# Patient Record
Sex: Female | Born: 1935 | Race: White | Hispanic: No | Marital: Married | Smoking: Former smoker
Health system: Southern US, Community
[De-identification: ages and names within clinical notes are randomized; demographics above are authoritative.]

## PROBLEM LIST (undated history)

## (undated) DIAGNOSIS — J189 Pneumonia, unspecified organism: Secondary | ICD-10-CM

## (undated) DIAGNOSIS — L821 Other seborrheic keratosis: Secondary | ICD-10-CM

## (undated) DIAGNOSIS — M48061 Spinal stenosis, lumbar region without neurogenic claudication: Secondary | ICD-10-CM

## (undated) DIAGNOSIS — G309 Alzheimer's disease, unspecified: Secondary | ICD-10-CM

## (undated) DIAGNOSIS — G244 Idiopathic orofacial dystonia: Secondary | ICD-10-CM

## (undated) DIAGNOSIS — R1319 Other dysphagia: Secondary | ICD-10-CM

## (undated) DIAGNOSIS — M25559 Pain in unspecified hip: Secondary | ICD-10-CM

## (undated) DIAGNOSIS — M25569 Pain in unspecified knee: Secondary | ICD-10-CM

## (undated) DIAGNOSIS — F329 Major depressive disorder, single episode, unspecified: Secondary | ICD-10-CM

## (undated) DIAGNOSIS — H9319 Tinnitus, unspecified ear: Secondary | ICD-10-CM

## (undated) DIAGNOSIS — F0390 Unspecified dementia without behavioral disturbance: Secondary | ICD-10-CM

## (undated) DIAGNOSIS — R61 Generalized hyperhidrosis: Secondary | ICD-10-CM

## (undated) DIAGNOSIS — K589 Irritable bowel syndrome without diarrhea: Secondary | ICD-10-CM

## (undated) DIAGNOSIS — F419 Anxiety disorder, unspecified: Secondary | ICD-10-CM

## (undated) DIAGNOSIS — R51 Headache: Secondary | ICD-10-CM

## (undated) DIAGNOSIS — I129 Hypertensive chronic kidney disease with stage 1 through stage 4 chronic kidney disease, or unspecified chronic kidney disease: Secondary | ICD-10-CM

## (undated) DIAGNOSIS — I509 Heart failure, unspecified: Secondary | ICD-10-CM

## (undated) DIAGNOSIS — G43109 Migraine with aura, not intractable, without status migrainosus: Secondary | ICD-10-CM

## (undated) DIAGNOSIS — L89309 Pressure ulcer of unspecified buttock, unspecified stage: Secondary | ICD-10-CM

## (undated) DIAGNOSIS — R5381 Other malaise: Secondary | ICD-10-CM

## (undated) DIAGNOSIS — K2100 Gastro-esophageal reflux disease with esophagitis, without bleeding: Secondary | ICD-10-CM

## (undated) DIAGNOSIS — R0902 Hypoxemia: Secondary | ICD-10-CM

## (undated) DIAGNOSIS — K5900 Constipation, unspecified: Secondary | ICD-10-CM

## (undated) DIAGNOSIS — N390 Urinary tract infection, site not specified: Secondary | ICD-10-CM

## (undated) DIAGNOSIS — I480 Paroxysmal atrial fibrillation: Secondary | ICD-10-CM

## (undated) DIAGNOSIS — I872 Venous insufficiency (chronic) (peripheral): Secondary | ICD-10-CM

## (undated) DIAGNOSIS — J42 Unspecified chronic bronchitis: Secondary | ICD-10-CM

## (undated) DIAGNOSIS — R011 Cardiac murmur, unspecified: Secondary | ICD-10-CM

## (undated) DIAGNOSIS — E871 Hypo-osmolality and hyponatremia: Secondary | ICD-10-CM

## (undated) DIAGNOSIS — R4182 Altered mental status, unspecified: Secondary | ICD-10-CM

## (undated) DIAGNOSIS — D66 Hereditary factor VIII deficiency: Secondary | ICD-10-CM

## (undated) DIAGNOSIS — F0280 Dementia in other diseases classified elsewhere without behavioral disturbance: Secondary | ICD-10-CM

## (undated) DIAGNOSIS — G894 Chronic pain syndrome: Secondary | ICD-10-CM

## (undated) DIAGNOSIS — N289 Disorder of kidney and ureter, unspecified: Secondary | ICD-10-CM

## (undated) DIAGNOSIS — S329XXA Fracture of unspecified parts of lumbosacral spine and pelvis, initial encounter for closed fracture: Secondary | ICD-10-CM

## (undated) DIAGNOSIS — R609 Edema, unspecified: Secondary | ICD-10-CM

## (undated) DIAGNOSIS — I2510 Atherosclerotic heart disease of native coronary artery without angina pectoris: Secondary | ICD-10-CM

## (undated) DIAGNOSIS — M1990 Unspecified osteoarthritis, unspecified site: Secondary | ICD-10-CM

## (undated) DIAGNOSIS — E039 Hypothyroidism, unspecified: Secondary | ICD-10-CM

## (undated) DIAGNOSIS — I10 Essential (primary) hypertension: Secondary | ICD-10-CM

## (undated) DIAGNOSIS — E559 Vitamin D deficiency, unspecified: Secondary | ICD-10-CM

## (undated) DIAGNOSIS — C189 Malignant neoplasm of colon, unspecified: Secondary | ICD-10-CM

## (undated) DIAGNOSIS — L02419 Cutaneous abscess of limb, unspecified: Secondary | ICD-10-CM

## (undated) DIAGNOSIS — R0989 Other specified symptoms and signs involving the circulatory and respiratory systems: Secondary | ICD-10-CM

## (undated) DIAGNOSIS — R1310 Dysphagia, unspecified: Secondary | ICD-10-CM

## (undated) DIAGNOSIS — M810 Age-related osteoporosis without current pathological fracture: Secondary | ICD-10-CM

## (undated) DIAGNOSIS — D62 Acute posthemorrhagic anemia: Secondary | ICD-10-CM

## (undated) DIAGNOSIS — R5383 Other fatigue: Secondary | ICD-10-CM

## (undated) DIAGNOSIS — L03119 Cellulitis of unspecified part of limb: Secondary | ICD-10-CM

## (undated) DIAGNOSIS — R0602 Shortness of breath: Secondary | ICD-10-CM

## (undated) DIAGNOSIS — F22 Delusional disorders: Secondary | ICD-10-CM

## (undated) DIAGNOSIS — I6990 Unspecified sequelae of unspecified cerebrovascular disease: Secondary | ICD-10-CM

## (undated) DIAGNOSIS — D3500 Benign neoplasm of unspecified adrenal gland: Secondary | ICD-10-CM

## (undated) DIAGNOSIS — R413 Other amnesia: Secondary | ICD-10-CM

## (undated) DIAGNOSIS — E785 Hyperlipidemia, unspecified: Secondary | ICD-10-CM

## (undated) DIAGNOSIS — I214 Non-ST elevation (NSTEMI) myocardial infarction: Secondary | ICD-10-CM

## (undated) HISTORY — DX: Venous insufficiency (chronic) (peripheral): I87.2

## (undated) HISTORY — DX: Disorder of kidney and ureter, unspecified: N28.9

## (undated) HISTORY — DX: Cutaneous abscess of limb, unspecified: L02.419

## (undated) HISTORY — DX: Alzheimer's disease, unspecified: G30.9

## (undated) HISTORY — DX: Cellulitis of unspecified part of limb: L03.119

## (undated) HISTORY — DX: Anxiety disorder, unspecified: F41.9

## (undated) HISTORY — DX: Hypoxemia: R09.02

## (undated) HISTORY — DX: Dementia in other diseases classified elsewhere without behavioral disturbance: F02.80

## (undated) HISTORY — DX: Acute posthemorrhagic anemia: D62

## (undated) HISTORY — DX: Irritable bowel syndrome without diarrhea: K58.9

## (undated) HISTORY — DX: Urinary tract infection, site not specified: N39.0

## (undated) HISTORY — DX: Heart failure, unspecified: I50.9

## (undated) HISTORY — DX: Fracture of unspecified parts of lumbosacral spine and pelvis, initial encounter for closed fracture: S32.9XXA

## (undated) HISTORY — DX: Hyperlipidemia, unspecified: E78.5

## (undated) HISTORY — DX: Hypertensive chronic kidney disease with stage 1 through stage 4 chronic kidney disease, or unspecified chronic kidney disease: I12.9

## (undated) HISTORY — DX: Tinnitus, unspecified ear: H93.19

## (undated) HISTORY — DX: Unspecified sequelae of unspecified cerebrovascular disease: I69.90

## (undated) HISTORY — DX: Vitamin D deficiency, unspecified: E55.9

## (undated) HISTORY — DX: Dysphagia, unspecified: R13.10

## (undated) HISTORY — DX: Other seborrheic keratosis: L82.1

## (undated) HISTORY — DX: Other dysphagia: R13.19

## (undated) HISTORY — DX: Hereditary factor VIII deficiency: D66

## (undated) HISTORY — DX: Spinal stenosis, lumbar region without neurogenic claudication: M48.061

## (undated) HISTORY — DX: Other amnesia: R41.3

## (undated) HISTORY — DX: Idiopathic orofacial dystonia: G24.4

## (undated) HISTORY — DX: Pressure ulcer of unspecified buttock, unspecified stage: L89.309

## (undated) HISTORY — DX: Constipation, unspecified: K59.00

## (undated) HISTORY — DX: Malignant neoplasm of colon, unspecified: C18.9

## (undated) HISTORY — PX: TONSILLECTOMY: SUR1361

## (undated) HISTORY — DX: Benign neoplasm of unspecified adrenal gland: D35.00

## (undated) HISTORY — DX: Gastro-esophageal reflux disease with esophagitis, without bleeding: K21.00

## (undated) HISTORY — DX: Delusional disorders: F22

## (undated) HISTORY — DX: Unspecified dementia without behavioral disturbance: F03.90

## (undated) HISTORY — DX: Atherosclerotic heart disease of native coronary artery without angina pectoris: I25.10

## (undated) HISTORY — DX: Altered mental status, unspecified: R41.82

## (undated) HISTORY — DX: Age-related osteoporosis without current pathological fracture: M81.0

## (undated) HISTORY — DX: Shortness of breath: R06.02

## (undated) HISTORY — DX: Pain in unspecified knee: M25.569

## (undated) HISTORY — DX: Unspecified osteoarthritis, unspecified site: M19.90

## (undated) HISTORY — DX: Edema, unspecified: R60.9

## (undated) HISTORY — DX: Pain in unspecified hip: M25.559

## (undated) HISTORY — DX: Unspecified chronic bronchitis: J42

## (undated) HISTORY — DX: Gastro-esophageal reflux disease with esophagitis: K21.0

## (undated) HISTORY — DX: Paroxysmal atrial fibrillation: I48.0

## (undated) HISTORY — DX: Other malaise: R53.81

## (undated) HISTORY — DX: Other fatigue: R53.83

## (undated) HISTORY — DX: Other specified symptoms and signs involving the circulatory and respiratory systems: R09.89

## (undated) HISTORY — DX: Generalized hyperhidrosis: R61

## (undated) HISTORY — DX: Pneumonia, unspecified organism: J18.9

## (undated) HISTORY — DX: Migraine with aura, not intractable, without status migrainosus: G43.109

## (undated) HISTORY — DX: Non-ST elevation (NSTEMI) myocardial infarction: I21.4

## (undated) HISTORY — DX: Hypo-osmolality and hyponatremia: E87.1

## (undated) HISTORY — DX: Chronic pain syndrome: G89.4

---

## 1998-08-25 ENCOUNTER — Ambulatory Visit: Admission: RE | Admit: 1998-08-25 | Discharge: 1998-08-25 | Payer: Self-pay | Admitting: Internal Medicine

## 1999-01-09 ENCOUNTER — Encounter: Payer: Self-pay | Admitting: Ophthalmology

## 1999-01-09 ENCOUNTER — Ambulatory Visit (HOSPITAL_COMMUNITY): Admission: RE | Admit: 1999-01-09 | Discharge: 1999-01-10 | Payer: Self-pay | Admitting: Ophthalmology

## 1999-02-14 ENCOUNTER — Encounter: Payer: Self-pay | Admitting: Emergency Medicine

## 1999-02-14 ENCOUNTER — Emergency Department (HOSPITAL_COMMUNITY): Admission: EM | Admit: 1999-02-14 | Discharge: 1999-02-14 | Payer: Self-pay | Admitting: Emergency Medicine

## 1999-12-11 ENCOUNTER — Encounter: Admission: RE | Admit: 1999-12-11 | Discharge: 2000-02-22 | Payer: Self-pay | Admitting: Family Medicine

## 2000-02-27 ENCOUNTER — Encounter: Admission: RE | Admit: 2000-02-27 | Discharge: 2000-02-27 | Payer: Self-pay | Admitting: Internal Medicine

## 2000-02-27 ENCOUNTER — Encounter: Payer: Self-pay | Admitting: Internal Medicine

## 2000-11-12 DIAGNOSIS — E785 Hyperlipidemia, unspecified: Secondary | ICD-10-CM

## 2000-11-12 HISTORY — DX: Hyperlipidemia, unspecified: E78.5

## 2001-11-12 DIAGNOSIS — G43109 Migraine with aura, not intractable, without status migrainosus: Secondary | ICD-10-CM

## 2001-11-12 DIAGNOSIS — R0989 Other specified symptoms and signs involving the circulatory and respiratory systems: Secondary | ICD-10-CM

## 2001-11-12 DIAGNOSIS — H9319 Tinnitus, unspecified ear: Secondary | ICD-10-CM

## 2001-11-12 HISTORY — DX: Migraine with aura, not intractable, without status migrainosus: G43.109

## 2001-11-12 HISTORY — DX: Tinnitus, unspecified ear: H93.19

## 2001-11-12 HISTORY — DX: Other specified symptoms and signs involving the circulatory and respiratory systems: R09.89

## 2002-09-29 DIAGNOSIS — G43109 Migraine with aura, not intractable, without status migrainosus: Secondary | ICD-10-CM | POA: Insufficient documentation

## 2002-09-29 DIAGNOSIS — H9319 Tinnitus, unspecified ear: Secondary | ICD-10-CM | POA: Insufficient documentation

## 2002-11-12 DIAGNOSIS — F419 Anxiety disorder, unspecified: Secondary | ICD-10-CM

## 2002-11-12 DIAGNOSIS — I6990 Unspecified sequelae of unspecified cerebrovascular disease: Secondary | ICD-10-CM

## 2002-11-12 HISTORY — DX: Unspecified sequelae of unspecified cerebrovascular disease: I69.90

## 2002-11-12 HISTORY — DX: Anxiety disorder, unspecified: F41.9

## 2003-06-20 ENCOUNTER — Ambulatory Visit (HOSPITAL_COMMUNITY): Admission: RE | Admit: 2003-06-20 | Discharge: 2003-06-20 | Payer: Self-pay | Admitting: Internal Medicine

## 2003-06-20 ENCOUNTER — Encounter: Payer: Self-pay | Admitting: Internal Medicine

## 2003-10-27 ENCOUNTER — Emergency Department (HOSPITAL_COMMUNITY): Admission: EM | Admit: 2003-10-27 | Discharge: 2003-10-27 | Payer: Self-pay | Admitting: Emergency Medicine

## 2003-11-03 DIAGNOSIS — F0280 Dementia in other diseases classified elsewhere without behavioral disturbance: Secondary | ICD-10-CM | POA: Insufficient documentation

## 2003-11-13 DIAGNOSIS — R0602 Shortness of breath: Secondary | ICD-10-CM

## 2003-11-13 HISTORY — DX: Shortness of breath: R06.02

## 2003-11-18 ENCOUNTER — Ambulatory Visit (HOSPITAL_COMMUNITY): Admission: RE | Admit: 2003-11-18 | Discharge: 2003-11-18 | Payer: Self-pay | Admitting: Neurology

## 2004-06-28 DIAGNOSIS — R0602 Shortness of breath: Secondary | ICD-10-CM | POA: Insufficient documentation

## 2004-06-28 DIAGNOSIS — M1990 Unspecified osteoarthritis, unspecified site: Secondary | ICD-10-CM

## 2004-06-28 HISTORY — DX: Unspecified osteoarthritis, unspecified site: M19.90

## 2004-10-16 ENCOUNTER — Ambulatory Visit (HOSPITAL_COMMUNITY): Admission: RE | Admit: 2004-10-16 | Discharge: 2004-10-16 | Payer: Self-pay | Admitting: Ophthalmology

## 2004-11-12 DIAGNOSIS — J42 Unspecified chronic bronchitis: Secondary | ICD-10-CM

## 2004-11-12 HISTORY — DX: Unspecified chronic bronchitis: J42

## 2004-12-25 ENCOUNTER — Ambulatory Visit (HOSPITAL_COMMUNITY): Admission: RE | Admit: 2004-12-25 | Discharge: 2004-12-26 | Payer: Self-pay | Admitting: Ophthalmology

## 2005-06-12 DIAGNOSIS — F32A Depression, unspecified: Secondary | ICD-10-CM

## 2005-06-12 HISTORY — DX: Depression, unspecified: F32.A

## 2005-06-21 DIAGNOSIS — E039 Hypothyroidism, unspecified: Secondary | ICD-10-CM | POA: Insufficient documentation

## 2005-09-04 ENCOUNTER — Ambulatory Visit: Payer: Self-pay

## 2005-09-13 ENCOUNTER — Ambulatory Visit: Payer: Self-pay | Admitting: Internal Medicine

## 2005-09-28 ENCOUNTER — Ambulatory Visit: Payer: Self-pay | Admitting: Internal Medicine

## 2005-10-12 ENCOUNTER — Ambulatory Visit: Payer: Self-pay | Admitting: Internal Medicine

## 2005-11-12 DIAGNOSIS — K589 Irritable bowel syndrome without diarrhea: Secondary | ICD-10-CM

## 2005-11-12 DIAGNOSIS — R61 Generalized hyperhidrosis: Secondary | ICD-10-CM

## 2005-11-12 HISTORY — DX: Irritable bowel syndrome, unspecified: K58.9

## 2005-11-12 HISTORY — DX: Generalized hyperhidrosis: R61

## 2005-11-30 ENCOUNTER — Ambulatory Visit: Payer: Self-pay | Admitting: Pulmonary Disease

## 2006-01-11 ENCOUNTER — Ambulatory Visit: Payer: Self-pay | Admitting: Internal Medicine

## 2006-02-20 DIAGNOSIS — K589 Irritable bowel syndrome without diarrhea: Secondary | ICD-10-CM | POA: Insufficient documentation

## 2006-03-01 ENCOUNTER — Encounter: Admission: RE | Admit: 2006-03-01 | Discharge: 2006-03-01 | Payer: Self-pay | Admitting: Gastroenterology

## 2006-03-15 ENCOUNTER — Ambulatory Visit (HOSPITAL_COMMUNITY): Admission: RE | Admit: 2006-03-15 | Discharge: 2006-03-15 | Payer: Self-pay | Admitting: Neurology

## 2006-08-16 ENCOUNTER — Ambulatory Visit: Payer: Self-pay

## 2006-11-12 DIAGNOSIS — F0390 Unspecified dementia without behavioral disturbance: Secondary | ICD-10-CM

## 2006-11-12 DIAGNOSIS — F0392 Unspecified dementia, unspecified severity, with psychotic disturbance: Secondary | ICD-10-CM

## 2006-11-12 HISTORY — DX: Unspecified dementia, unspecified severity, with psychotic disturbance: F03.92

## 2006-11-12 HISTORY — DX: Unspecified dementia without behavioral disturbance: F03.90

## 2007-02-04 ENCOUNTER — Emergency Department (HOSPITAL_COMMUNITY): Admission: EM | Admit: 2007-02-04 | Discharge: 2007-02-04 | Payer: Self-pay | Admitting: *Deleted

## 2007-02-27 ENCOUNTER — Ambulatory Visit: Payer: Self-pay

## 2007-04-10 DIAGNOSIS — R634 Abnormal weight loss: Secondary | ICD-10-CM | POA: Insufficient documentation

## 2007-09-18 ENCOUNTER — Encounter: Admission: RE | Admit: 2007-09-18 | Discharge: 2007-09-18 | Payer: Self-pay | Admitting: Orthopedic Surgery

## 2007-11-13 DIAGNOSIS — D66 Hereditary factor VIII deficiency: Secondary | ICD-10-CM

## 2007-11-13 DIAGNOSIS — R5381 Other malaise: Secondary | ICD-10-CM

## 2007-11-13 HISTORY — DX: Hereditary factor VIII deficiency: D66

## 2007-11-13 HISTORY — DX: Other malaise: R53.81

## 2008-01-13 ENCOUNTER — Ambulatory Visit: Payer: Self-pay

## 2008-01-16 ENCOUNTER — Ambulatory Visit: Payer: Self-pay | Admitting: Cardiology

## 2008-01-29 ENCOUNTER — Ambulatory Visit: Payer: Self-pay

## 2008-01-29 ENCOUNTER — Encounter: Payer: Self-pay | Admitting: Cardiology

## 2008-02-23 ENCOUNTER — Ambulatory Visit: Payer: Self-pay | Admitting: Cardiology

## 2008-11-12 DIAGNOSIS — E559 Vitamin D deficiency, unspecified: Secondary | ICD-10-CM

## 2008-11-12 DIAGNOSIS — N289 Disorder of kidney and ureter, unspecified: Secondary | ICD-10-CM

## 2008-11-12 DIAGNOSIS — R609 Edema, unspecified: Secondary | ICD-10-CM

## 2008-11-12 HISTORY — DX: Disorder of kidney and ureter, unspecified: N28.9

## 2008-11-12 HISTORY — DX: Edema, unspecified: R60.9

## 2008-11-12 HISTORY — DX: Vitamin D deficiency, unspecified: E55.9

## 2009-01-26 ENCOUNTER — Ambulatory Visit: Payer: Self-pay

## 2009-04-15 ENCOUNTER — Emergency Department (HOSPITAL_BASED_OUTPATIENT_CLINIC_OR_DEPARTMENT_OTHER): Admission: EM | Admit: 2009-04-15 | Discharge: 2009-04-15 | Payer: Self-pay | Admitting: Emergency Medicine

## 2009-04-15 ENCOUNTER — Ambulatory Visit: Payer: Self-pay | Admitting: Diagnostic Radiology

## 2009-04-20 ENCOUNTER — Emergency Department (HOSPITAL_BASED_OUTPATIENT_CLINIC_OR_DEPARTMENT_OTHER): Admission: EM | Admit: 2009-04-20 | Discharge: 2009-04-20 | Payer: Self-pay | Admitting: Emergency Medicine

## 2009-04-26 ENCOUNTER — Ambulatory Visit (HOSPITAL_COMMUNITY): Admission: RE | Admit: 2009-04-26 | Discharge: 2009-04-26 | Payer: Self-pay | Admitting: Orthopedic Surgery

## 2009-04-26 ENCOUNTER — Encounter (INDEPENDENT_AMBULATORY_CARE_PROVIDER_SITE_OTHER): Payer: Self-pay | Admitting: Orthopedic Surgery

## 2009-04-26 ENCOUNTER — Ambulatory Visit: Payer: Self-pay | Admitting: Vascular Surgery

## 2009-04-28 ENCOUNTER — Encounter: Admission: RE | Admit: 2009-04-28 | Discharge: 2009-05-27 | Payer: Self-pay | Admitting: Orthopedic Surgery

## 2009-07-21 DIAGNOSIS — R609 Edema, unspecified: Secondary | ICD-10-CM | POA: Insufficient documentation

## 2009-09-06 DIAGNOSIS — E559 Vitamin D deficiency, unspecified: Secondary | ICD-10-CM | POA: Insufficient documentation

## 2009-10-19 ENCOUNTER — Encounter: Admission: RE | Admit: 2009-10-19 | Discharge: 2009-10-19 | Payer: Self-pay | Admitting: Internal Medicine

## 2009-11-12 DIAGNOSIS — M48061 Spinal stenosis, lumbar region without neurogenic claudication: Secondary | ICD-10-CM

## 2009-11-12 HISTORY — DX: Spinal stenosis, lumbar region without neurogenic claudication: M48.061

## 2010-01-27 ENCOUNTER — Encounter: Admission: RE | Admit: 2010-01-27 | Discharge: 2010-01-27 | Payer: Self-pay | Admitting: Orthopedic Surgery

## 2010-05-25 DIAGNOSIS — B3749 Other urogenital candidiasis: Secondary | ICD-10-CM | POA: Insufficient documentation

## 2010-08-15 DIAGNOSIS — R635 Abnormal weight gain: Secondary | ICD-10-CM | POA: Insufficient documentation

## 2010-08-28 ENCOUNTER — Encounter: Admission: RE | Admit: 2010-08-28 | Discharge: 2010-08-28 | Payer: Self-pay | Admitting: Neurosurgery

## 2010-09-12 DIAGNOSIS — R413 Other amnesia: Secondary | ICD-10-CM

## 2010-09-12 HISTORY — DX: Other amnesia: R41.3

## 2010-09-27 DIAGNOSIS — R413 Other amnesia: Secondary | ICD-10-CM | POA: Insufficient documentation

## 2010-09-27 DIAGNOSIS — I10 Essential (primary) hypertension: Secondary | ICD-10-CM | POA: Insufficient documentation

## 2010-11-12 DIAGNOSIS — M25569 Pain in unspecified knee: Secondary | ICD-10-CM

## 2010-11-12 DIAGNOSIS — I129 Hypertensive chronic kidney disease with stage 1 through stage 4 chronic kidney disease, or unspecified chronic kidney disease: Secondary | ICD-10-CM

## 2010-11-12 DIAGNOSIS — L821 Other seborrheic keratosis: Secondary | ICD-10-CM

## 2010-11-12 HISTORY — DX: Pain in unspecified knee: M25.569

## 2010-11-12 HISTORY — DX: Hypertensive chronic kidney disease with stage 1 through stage 4 chronic kidney disease, or unspecified chronic kidney disease: I12.9

## 2010-11-12 HISTORY — DX: Other seborrheic keratosis: L82.1

## 2010-11-23 DIAGNOSIS — E871 Hypo-osmolality and hyponatremia: Secondary | ICD-10-CM | POA: Insufficient documentation

## 2010-11-28 ENCOUNTER — Encounter
Admission: RE | Admit: 2010-11-28 | Discharge: 2010-11-28 | Payer: Self-pay | Source: Home / Self Care | Attending: Internal Medicine | Admitting: Internal Medicine

## 2010-12-26 ENCOUNTER — Other Ambulatory Visit: Payer: Self-pay | Admitting: Internal Medicine

## 2010-12-26 ENCOUNTER — Ambulatory Visit
Admission: RE | Admit: 2010-12-26 | Discharge: 2010-12-26 | Disposition: A | Discharge status: Home / Self Care | Payer: Medicare Other | Source: Ambulatory Visit | Attending: Internal Medicine | Admitting: Internal Medicine

## 2010-12-26 DIAGNOSIS — M25569 Pain in unspecified knee: Secondary | ICD-10-CM | POA: Insufficient documentation

## 2010-12-26 DIAGNOSIS — R52 Pain, unspecified: Secondary | ICD-10-CM

## 2010-12-26 DIAGNOSIS — I129 Hypertensive chronic kidney disease with stage 1 through stage 4 chronic kidney disease, or unspecified chronic kidney disease: Secondary | ICD-10-CM | POA: Insufficient documentation

## 2011-02-01 DIAGNOSIS — L821 Other seborrheic keratosis: Secondary | ICD-10-CM | POA: Insufficient documentation

## 2011-03-09 DIAGNOSIS — E559 Vitamin D deficiency, unspecified: Secondary | ICD-10-CM

## 2011-03-09 DIAGNOSIS — G43109 Migraine with aura, not intractable, without status migrainosus: Secondary | ICD-10-CM

## 2011-03-09 DIAGNOSIS — F32A Depression, unspecified: Secondary | ICD-10-CM

## 2011-03-09 DIAGNOSIS — J42 Unspecified chronic bronchitis: Secondary | ICD-10-CM

## 2011-03-09 DIAGNOSIS — E871 Hypo-osmolality and hyponatremia: Secondary | ICD-10-CM

## 2011-03-09 DIAGNOSIS — M1990 Unspecified osteoarthritis, unspecified site: Secondary | ICD-10-CM

## 2011-03-09 DIAGNOSIS — R61 Generalized hyperhidrosis: Secondary | ICD-10-CM

## 2011-03-09 DIAGNOSIS — I10 Essential (primary) hypertension: Secondary | ICD-10-CM

## 2011-03-09 DIAGNOSIS — L821 Other seborrheic keratosis: Secondary | ICD-10-CM

## 2011-03-09 DIAGNOSIS — F0392 Unspecified dementia, unspecified severity, with psychotic disturbance: Secondary | ICD-10-CM

## 2011-03-09 DIAGNOSIS — D66 Hereditary factor VIII deficiency: Secondary | ICD-10-CM

## 2011-03-09 DIAGNOSIS — B3749 Other urogenital candidiasis: Secondary | ICD-10-CM

## 2011-03-09 DIAGNOSIS — R0602 Shortness of breath: Secondary | ICD-10-CM

## 2011-03-09 DIAGNOSIS — F0280 Dementia in other diseases classified elsewhere without behavioral disturbance: Secondary | ICD-10-CM

## 2011-03-09 DIAGNOSIS — R635 Abnormal weight gain: Secondary | ICD-10-CM

## 2011-03-09 DIAGNOSIS — E039 Hypothyroidism, unspecified: Secondary | ICD-10-CM

## 2011-03-09 DIAGNOSIS — R109 Unspecified abdominal pain: Secondary | ICD-10-CM

## 2011-03-09 DIAGNOSIS — Z79899 Other long term (current) drug therapy: Secondary | ICD-10-CM

## 2011-03-09 DIAGNOSIS — F329 Major depressive disorder, single episode, unspecified: Secondary | ICD-10-CM

## 2011-03-09 DIAGNOSIS — M48061 Spinal stenosis, lumbar region without neurogenic claudication: Secondary | ICD-10-CM

## 2011-03-09 DIAGNOSIS — K589 Irritable bowel syndrome without diarrhea: Secondary | ICD-10-CM

## 2011-03-09 DIAGNOSIS — R21 Rash and other nonspecific skin eruption: Secondary | ICD-10-CM

## 2011-03-09 DIAGNOSIS — I129 Hypertensive chronic kidney disease with stage 1 through stage 4 chronic kidney disease, or unspecified chronic kidney disease: Secondary | ICD-10-CM

## 2011-03-09 DIAGNOSIS — R5383 Other fatigue: Secondary | ICD-10-CM

## 2011-03-09 DIAGNOSIS — E785 Hyperlipidemia, unspecified: Secondary | ICD-10-CM

## 2011-03-09 DIAGNOSIS — K929 Disease of digestive system, unspecified: Secondary | ICD-10-CM

## 2011-03-09 DIAGNOSIS — R609 Edema, unspecified: Secondary | ICD-10-CM

## 2011-03-09 DIAGNOSIS — R634 Abnormal weight loss: Secondary | ICD-10-CM

## 2011-03-09 DIAGNOSIS — F419 Anxiety disorder, unspecified: Secondary | ICD-10-CM

## 2011-03-09 DIAGNOSIS — R0989 Other specified symptoms and signs involving the circulatory and respiratory systems: Secondary | ICD-10-CM

## 2011-03-09 DIAGNOSIS — R42 Dizziness and giddiness: Secondary | ICD-10-CM

## 2011-03-09 DIAGNOSIS — F0390 Unspecified dementia without behavioral disturbance: Secondary | ICD-10-CM

## 2011-03-09 DIAGNOSIS — I6990 Unspecified sequelae of unspecified cerebrovascular disease: Secondary | ICD-10-CM

## 2011-03-09 DIAGNOSIS — M25569 Pain in unspecified knee: Secondary | ICD-10-CM

## 2011-03-09 DIAGNOSIS — N289 Disorder of kidney and ureter, unspecified: Secondary | ICD-10-CM

## 2011-03-09 DIAGNOSIS — R413 Other amnesia: Secondary | ICD-10-CM
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
## 2011-03-27 NOTE — Assessment & Plan Note (Signed)
[facility]                            CARDIOLOGY OFFICE NOTE   ESTEPHANIE, HUBBS                        MRN:          [id number]  DATE:02/23/2008                            DOB:          10-31-1927    PRIMARY CARE PHYSICIAN:  Dr. Jimmye Norman with [facility].   REASON FOR VISIT:  Follow-up testing.   HISTORY OF PRESENT ILLNESS:  I saw Ms. Steele back in March.  Her history  is detailed in my previous note.  With a known history of moderate  bilateral internal carotid disease and also left subclavian disease,  hypertension, and previous TIA, I referred her for follow-up testing.  She had evidence of possible aortic valve sclerosis on examination and  this was confirmed by echocardiography, demonstrating a moderately  calcified aortic valve with mildly reduced leaflet excursion, but no  frank stenosis.  Her ejection fraction was 65-70%, and she had mild  annular calcification with mild mitral regurgitation.  She had decreased  lower extremity distal pulses without frank claudication, and to further  investigate this, we did lower extremity Doppler studies which revealed  normal ABIs with no clear evidence of resting obstructive peripheral  arterial disease.  I see that she had recent lipids obtained showing a  total cholesterol of 189, triglycerides 78, HDL 66 and LDL 107.  I went  over all these issues today, and at this point I would anticipate basic  risk factor modification.  She is tolerating Norvasc and may need to  have this advanced over time, depending on her blood pressure settles  out.  She is also on an aspirin daily.  We did to some degree touch on  the possibility of a low-dose statin to be fairly aggressive with her  LDL control, getting it closer to 70.  I told them to give this some  thought and talk it over with Dr. Hyacinth Meeker.  At this point, we would  anticipate a yearly follow-up visit for peripheral arterial disease.   ALLERGIES:  PENICILLIN.   MEDICATIONS:  1. Synthroid 88 mcg p.o. daily.  2. Lamictal 200 mg p.o. daily.  3. Zoloft 50 mg p.o. daily.  4. Xanax 0.5 mg p.o. q.6 h., p.r.n.  5. Trazodone 150 mg 2 tablets p.o. nightly.  6. Vitamin C, E and D.  7. Calcium supplements.  8. Norvasc 5 mg p.o. daily.  9. Aspirin 81 mg p.o. daily.   REVIEW OF SYSTEMS:  As described in history of present illness.   PHYSICAL EXAMINATION:  VITAL SIGNS:  Blood pressure rechecked by me in  the left arm, 148/88, right arm 158/82, heart rate 64.  Weight 121  pounds.  GENERAL:  This patient is comfortable in no acute distress.  HEENT:  Conjunctivae normal.  Oropharynx clear.  NECK:  Supple.  No elevated jugulovenous pressure.  A soft left carotid  bruit is noted.  No thyromegaly.  LUNGS:  Clear without labored breathing.  CARDIAC:  Reveals a regular rate and rhythm with a 2/6 systolic murmur  at the base.  No S3 gallop.  EXTREMITIES:  Exhibit diminished distal  pulses.  No pitting edema.   IMPRESSION/RECOMMENDATIONS:  1. Peripheral arterial disease, including moderate carotid artery      disease, graded at 40-59% bilaterally, normal peripheral lower      extremity ankle-brachial indexes without frank claudication, and      probable left subclavian stenosis.  At this point, would recommend      risk factor modification.  She will continue on aspirin, and I      would give strong consideration to a low-dose statin for aggressive      control of LDL closer to 70.  She will plan to discuss Dr. Hyacinth Meeker.      Otherwise, we will plan to see back and one years' time.  At that      point, she would need follow-up carotid studies at a minimum.  2. Aortic valve sclerosis without frank stenosis.  This will be      followed expectantly.  3. Hypertension, not optimally controlled today.  Norvasc is      relatively new.  She will check her pressure occasionally at home      and follow-up with Dr. Hyacinth Meeker.  She could have  further up titration      of her Norvasc as needed.     Jonelle Sidle, MD  Electronically Signed    SGM/MedQ  DD: 02/23/2008  DT: 02/23/2008  Job #: [id number]   cc:   Jacalyn Lefevre, MD  Evie Lacks, MD

## 2011-03-30 NOTE — Op Note (Signed)
NAMENATELIE, OSTROSKY                 ACCOUNT NO.:  [id number]   MEDICAL RECORD NO.:  [id number]          PATIENT TYPE:  OIB   LOCATION:  2899                         FACILITY:  [facility]   PHYSICIAN:  Lanna Poche, M.D. DATE OF BIRTH:  25-Jul-1927   DATE OF PROCEDURE:  10/16/2004  DATE OF DISCHARGE:                                 OPERATIVE REPORT   PREOPERATIVE DIAGNOSIS:  Rhegmatogenous retinal detachment right eye.   POSTOPERATIVE DIAGNOSIS:  Rhegmatogenous retinal detachment right eye.   PROCEDURE:  Pars plana vitrectomy, laser retinal break, peripheral laser  photocoagulation, gas-fluid exchange with 16% C3F8 right eye.   SURGEON:  Lanna Poche, M.D.   ASSISTANT:  Ulysees Barns, LPN.   ANESTHESIA:  General endotracheal.   ESTIMATED BLOOD LOSS:  Less than 1 cc.   COMPLICATIONS:  None.   OPERATIVE NOTE:  The patient was taken to the operating room, and after  induction of general anesthesia, the right eye was prepped and draped in the  usual fashion.  The lid speculum was introduced.  Conjunctival peritomy was  developed temporally and then extended 370 degrees, sparing the inferonasal  quadrant.  Hemostasis was obtained with erase cautery.  Sclerotomies were  fashioned 3 mm posterior to the limbus at 1:30, 10:30, and 7:30.  The  superior sclerotomies were plugged, and 4 mm infusion cannula was secured at  the 7:30 sclerotomy with a temporary suture of 7-0 Vicryl.  The tip was  initially inspected and found to be in good position.  The Landers ring was  secured to the globe with 7-0 Vicryl sutures at 3 and 9.  The plugs were  removed.  A 30 degree prismatic lens was applied to the surface of the eye.  Central core followed by peripheral vitrectomy was performed with the  __________  shaver.  There was considerable __________ , and scleral  depression was used to trim the inferior vitreous base.  When trimming the  base, it was noted that there was only one detachment  noted preoperatively  at approximately 7 o'clock, and the vitreous was trimmed completely around  this area.  No additional breaks or tears were seen.  The macula was  inspected and found to be free of significant epiretinal membranes.   The instruments were removed from the eye as the hole was plugged.  The  Landers ring was removed.  Inspection with the ophthalmoscope and scleral  depression failed to reveal any additional retinal breaks or tears.  Peripheral line was then used over the posterior pole to force the egress of  fluid through the inferior break, and then laser photocoagulation was  applied around the inferior break as well as scattered laser  photocoagulation around the rest of the untreated peripheral retina in  __________  areas particularly superiorly.  After completion of the  peripheral laser photocoagulation, attention was directed back to the eye,  where a peripheral line fluid exchange was performed followed by a gas-fluid  exchange, removing all visible __________ .  A small amount of fluid was  seen to settle posterior in the inferior  aspect of the retina.  The  instruments were removed from the eyes, and the hole was plugged.  The  supranasal sclerotomy was closed with 7-0 Vicryl.   A mixture of 16% C3F8 was developed in the usual sterile fashion.  It was  infused in the infusion cannula, with egress through the superotemporal  sclerotomy.  The superotemporal sclerotomy was then closed.  The pressure  was adjusted to approximately 21 mmHg by Barraquer tonometer, and the  infusion cannula removed and preplaced sutures secured.  The pressure was  rechecked and found to lie just slightly higher than 21.  The conjunctiva  was then drawn up and reapproximated with a running suture of 6-0 plain gut.  The subconjunctival space was irrigated with 0.75% Marcaine followed by some  conjunctival injection of 100 mg of ceftazidime and 10 mg of Decadron.  The  lid speculum  was then removed, and mixed antibiotic ointment applied to the  surfaces of the right eye.  An eye patch and seal were then placed over the  patient's right eye.  Upon wakening in the operating room, the patient left  the operating room in stable condition      JTH/MEDQ  D:  10/16/2004  T:  10/16/2004  Job:  [id number]   cc:   Lanna Poche, M.D.  [facility]  [address]  [place]  [place] [address]  Fax: [phone]

## 2011-03-30 NOTE — Op Note (Signed)
Jenny [name], Jenny [name]                 ACCOUNT NO.:  [id number]   MEDICAL RECORD NO.:  [id number]          PATIENT TYPE:  OIB   LOCATION:  2550                         FACILITY:  [facility]   PHYSICIAN:  Lanna Poche, M.D. DATE OF BIRTH:  1927-04-13   DATE OF PROCEDURE:  12/25/2004  DATE OF DISCHARGE:                                 OPERATIVE REPORT   PREOPERATIVE DIAGNOSIS:  Macula hole of the right eye.   POSTOPERATIVE DIAGNOSIS:  Macula hole of the right eye.   PROCEDURE:  Pars plana vitrectomy, membrane peeling, gas fluid exchange,  autologous serum pack 16% C3F8 right eye.   SURGEON:  Lanna Poche, M.D.   ASSISTANT:  Yehuda Budd.   ANESTHESIA:  General endotracheal anesthesia.   ESTIMATED BLOOD LOSS:  Less than 1 mL.   COMPLICATIONS:  None.   DESCRIPTION OF PROCEDURE:  The patient was taken to the operating room where  after induction of general anesthesia, the conjunctiva was opened __________  peritomies were fashioned 3 mm posterior to the limbus at 1:30, 10:30 and  7:30.  The superior sclerotomies were plugged and a 4 mm infusion cannula  was secured to the floor of the 7:30 sclerotomy.  Temporary suture of 7-0  Vicryl.  The tip was visually inspected and found to be in good position.  A  Landers ring was secured to the globe with 7-0 Vicryl suture at 3 and 9.  Plugs removed and 30 degree prismatic lens applied to the surface of the  eye.  A central vitreous washout was performed with vitrectomy.  A magnified  flat lens was then applied to the surface of the eye.  Inspection of the  macula showed a full thickness typical macula hole __________  surface.  The  MVR blade is used to make an incision to the internal limiting membrane and  then the edges elevated lightly, completed barbed MVR blade.  The rice pick  was then used to fully elevate the internal limiting membrane off the  surface of the retina and then a Michael's pick used to complete disengage  the thick  membrane from the edges of the macula hole.  The membrane was very  adherent and considerable traction was exerted on the folded edges as the  membrane was peeled.  The membrane was removed from the eye in two pieces.  Instruments removed.  The eye hole was plugged.  Landers lens ring removed.  Inspection with the ophthalmoscope showed there to be no peripheral retinal  breaks or tears and the indirect ophthalmoscope was used with a silicone  tipped catheter to perform a gas-fluid exchange.  The serum patch and then  the 16% C3F8 was drawn up in the usual sterile fashion.  Attention was then  directed back to the eye where two drops of autologous serum were placed  over the macula under direct observation.  The superonasal sclerotomy was  closed with 7-0 Vicryl.  20 mL of gas mixture infused in the fusion cannula  to egress the superior temporal sclerotomy and the superior temporal  sclerotomy was then closed  with 7-0 Vicryl.  The fusion cannula was removed.  Preplaced sutures secured.  The pressure was checked at 21 mmHg with  Barraquer tonometer by injection of additional gas mixture with 30-gauge  needle 3 mm posterior to limbus at 10 o'clock.  The conjunctiva was then  drawn up and reapproximated with alternating sutures of 6-0 plain gut.  The  subconjunctival space was irrigated with 0.75% Marcaine followed by  subconjunctival injection of 100 mg ceftazidime and 10 mg Decadron.  The lid  speculum was then removed and mixed antibiotic ointment applied to the  surface of the eye.  Patch and shield was then placed over the patient's  right eye.  Upon awakening from anesthesia, the patient left the operating  room in stable condition.      JTH/MEDQ  D:  12/25/2004  T:  12/25/2004  Job:  [id number]
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
## 2011-03-30 NOTE — Op Note (Signed)
NAMEMARISHKA, [name]                 ACCOUNT NO.:  [id number]   MEDICAL RECORD NO.:  [id number]          PATIENT TYPE:  OIB   LOCATION:  2899                         FACILITY:  [facility]   PHYSICIAN:  Lanna Poche, M.D. DATE OF BIRTH:  1927-08-03   DATE OF PROCEDURE:  DATE OF DISCHARGE:                                 OPERATIVE REPORT   Audio too short to transcribe (less than 5 seconds)      JTH/MEDQ  D:  10/16/2004  T:  10/16/2004  Job:  [id number]

## 2011-06-05 ENCOUNTER — Encounter (INDEPENDENT_AMBULATORY_CARE_PROVIDER_SITE_OTHER): Payer: Medicare Other | Admitting: Ophthalmology

## 2011-06-05 DIAGNOSIS — H3530 Unspecified macular degeneration: Secondary | ICD-10-CM

## 2011-06-05 DIAGNOSIS — H33009 Unspecified retinal detachment with retinal break, unspecified eye: Secondary | ICD-10-CM

## 2011-06-05 DIAGNOSIS — H43819 Vitreous degeneration, unspecified eye: Secondary | ICD-10-CM

## 2011-06-05 DIAGNOSIS — H35039 Hypertensive retinopathy, unspecified eye: Secondary | ICD-10-CM

## 2011-06-13 DIAGNOSIS — F0280 Dementia in other diseases classified elsewhere without behavioral disturbance: Secondary | ICD-10-CM

## 2011-06-13 HISTORY — DX: Dementia in other diseases classified elsewhere, unspecified severity, without behavioral disturbance, psychotic disturbance, mood disturbance, and anxiety: F02.80

## 2011-10-13 DIAGNOSIS — D3500 Benign neoplasm of unspecified adrenal gland: Secondary | ICD-10-CM

## 2011-10-13 HISTORY — DX: Benign neoplasm of unspecified adrenal gland: D35.00

## 2011-10-29 ENCOUNTER — Ambulatory Visit
Admission: RE | Admit: 2011-10-29 | Discharge: 2011-10-29 | Disposition: A | Discharge status: Home / Self Care | Payer: Medicare Other | Source: Ambulatory Visit | Attending: Internal Medicine | Admitting: Internal Medicine

## 2011-10-29 ENCOUNTER — Other Ambulatory Visit: Payer: Self-pay | Admitting: Internal Medicine

## 2011-10-29 DIAGNOSIS — R05 Cough: Secondary | ICD-10-CM

## 2011-10-29 DIAGNOSIS — R0989 Other specified symptoms and signs involving the circulatory and respiratory systems: Secondary | ICD-10-CM

## 2011-10-29 DIAGNOSIS — R509 Fever, unspecified: Secondary | ICD-10-CM

## 2011-10-30 ENCOUNTER — Other Ambulatory Visit: Payer: Self-pay | Admitting: Internal Medicine

## 2011-10-30 DIAGNOSIS — R911 Solitary pulmonary nodule: Secondary | ICD-10-CM

## 2011-10-31 ENCOUNTER — Ambulatory Visit
Admission: RE | Admit: 2011-10-31 | Discharge: 2011-10-31 | Disposition: A | Discharge status: Home / Self Care | Payer: Medicare Other | Source: Ambulatory Visit | Attending: Internal Medicine | Admitting: Internal Medicine

## 2011-10-31 ENCOUNTER — Other Ambulatory Visit: Payer: Self-pay | Admitting: Internal Medicine

## 2011-10-31 DIAGNOSIS — R911 Solitary pulmonary nodule: Secondary | ICD-10-CM

## 2011-11-13 DIAGNOSIS — I872 Venous insufficiency (chronic) (peripheral): Secondary | ICD-10-CM

## 2011-11-13 DIAGNOSIS — G894 Chronic pain syndrome: Secondary | ICD-10-CM

## 2011-11-13 DIAGNOSIS — L02419 Cutaneous abscess of limb, unspecified: Secondary | ICD-10-CM

## 2011-11-13 DIAGNOSIS — M810 Age-related osteoporosis without current pathological fracture: Secondary | ICD-10-CM

## 2011-11-13 DIAGNOSIS — K5900 Constipation, unspecified: Secondary | ICD-10-CM

## 2011-11-13 DIAGNOSIS — R4182 Altered mental status, unspecified: Secondary | ICD-10-CM

## 2011-11-13 HISTORY — DX: Chronic pain syndrome: G89.4

## 2011-11-13 HISTORY — DX: Altered mental status, unspecified: R41.82

## 2011-11-13 HISTORY — DX: Age-related osteoporosis without current pathological fracture: M81.0

## 2011-11-13 HISTORY — DX: Cellulitis of unspecified part of limb: L02.419

## 2011-11-13 HISTORY — DX: Venous insufficiency (chronic) (peripheral): I87.2

## 2011-11-13 HISTORY — DX: Constipation, unspecified: K59.00

## 2012-01-01 ENCOUNTER — Other Ambulatory Visit: Payer: Self-pay

## 2012-01-01 ENCOUNTER — Emergency Department (HOSPITAL_COMMUNITY): Payer: Medicare Other

## 2012-01-01 ENCOUNTER — Encounter (HOSPITAL_COMMUNITY): Payer: Self-pay | Admitting: *Deleted

## 2012-01-01 ENCOUNTER — Emergency Department (HOSPITAL_COMMUNITY)
Admission: EM | Admit: 2012-01-01 | Discharge: 2012-01-01 | Disposition: A | Discharge status: Home / Self Care | Payer: Medicare Other | Attending: Emergency Medicine | Admitting: Emergency Medicine

## 2012-01-01 DIAGNOSIS — Z7982 Long term (current) use of aspirin: Secondary | ICD-10-CM | POA: Insufficient documentation

## 2012-01-01 DIAGNOSIS — R600 Localized edema: Secondary | ICD-10-CM

## 2012-01-01 DIAGNOSIS — R609 Edema, unspecified: Secondary | ICD-10-CM | POA: Insufficient documentation

## 2012-01-01 DIAGNOSIS — I10 Essential (primary) hypertension: Secondary | ICD-10-CM | POA: Insufficient documentation

## 2012-01-01 DIAGNOSIS — M7989 Other specified soft tissue disorders: Secondary | ICD-10-CM | POA: Insufficient documentation

## 2012-01-01 DIAGNOSIS — I2510 Atherosclerotic heart disease of native coronary artery without angina pectoris: Secondary | ICD-10-CM | POA: Insufficient documentation

## 2012-01-01 DIAGNOSIS — Z79899 Other long term (current) drug therapy: Secondary | ICD-10-CM | POA: Insufficient documentation

## 2012-01-01 HISTORY — DX: Essential (primary) hypertension: I10

## 2012-01-01 HISTORY — DX: Atherosclerotic heart disease of native coronary artery without angina pectoris: I25.10

## 2012-01-01 HISTORY — DX: Cardiac murmur, unspecified: R01.1

## 2012-01-01 LAB — POCT I-STAT TROPONIN I: Troponin i, poc: 0.01 ng/mL (ref 0.00–0.08)

## 2012-01-01 LAB — DIFFERENTIAL
Lymphocytes Relative: 16 % (ref 12–46)
Lymphs Abs: 1.6 10*3/uL (ref 0.7–4.0)
Monocytes Relative: 15 % — ABNORMAL HIGH (ref 3–12)
Neutro Abs: 6.5 10*3/uL (ref 1.7–7.7)
Neutrophils Relative %: 64 % (ref 43–77)

## 2012-01-01 LAB — COMPREHENSIVE METABOLIC PANEL
Albumin: 3.5 g/dL (ref 3.5–5.2)
Alkaline Phosphatase: 84 U/L (ref 39–117)
BUN: 23 mg/dL (ref 6–23)
CO2: 26 mEq/L (ref 19–32)
Chloride: 98 mEq/L (ref 96–112)
GFR calc Af Amer: 50 mL/min — ABNORMAL LOW (ref >90)
Glucose, Bld: 83 mg/dL (ref 70–99)
Potassium: 4.4 mEq/L (ref 3.5–5.1)
Total Bilirubin: 0.2 mg/dL — ABNORMAL LOW (ref 0.3–1.2)

## 2012-01-01 LAB — CBC
Hemoglobin: 12.5 g/dL (ref 12.0–15.0)
RBC: 4.15 MIL/uL (ref 3.87–5.11)

## 2012-01-01 NOTE — ED Provider Notes (Signed)
Medical screening examination/treatment/procedure(s) were performed by non-physician practitioner and as supervising physician I was immediately available for consultation/collaboration.  Juliet Rude. Rubin Payor, MD 01/01/12 [phone]

## 2012-01-01 NOTE — Discharge Instructions (Signed)
Cut back on your salt intake.  Your kidneys are trying to balance your water and sodium levels and are therefore causing you to retain fluids.  Double your furosemide (lasix) for the next three days.  Take 40mg  twice a day.  Elevate your legs when possible.  Follow up with your doctor as scheduled on Thursday.  You can let him know that your blood pressure was elevated today (181/58 and 148/84).  You may return to the ER if your symptoms worsen, particularly if you develop chest pain or shortness of breath.  These are signs of fluid build up on the lungs.

## 2012-01-01 NOTE — ED Notes (Signed)
Pt states she started to have swelling in her bilateral lower ext. Pt states she has started to sit in her room move often and she is not as mobile as she use to be. Pt [facility] and [facility] nurses took her bp this morning. Pt states  bp was 210/98. Pt had a doctor appt this afternoon but she could not wait. Pt denies sob or chest pain

## 2012-01-01 NOTE — ED Provider Notes (Signed)
History     CSN: [id number]  Arrival date & time 01/01/12  1146   First MD Initiated Contact with Patient 01/01/12 1210      Chief Complaint  Patient presents with  . Hypertension  . Leg Swelling    (Consider location/radiation/quality/duration/timing/severity/associated sxs/prior treatment) HPI History provided by pt.   Pt has had peripheral edema since yesterday.  Has never had in the past.  Sx improve with elevation.  No associated LE pain.    Has not had cough, SOB or CP.  No h/o CHF or liver disease but recent abnormality in renal function test.  Pt reports eating and drinking normally but her husband states that she has had chicken and soup from [facility] approx 6 times over the last week.  She is taking bystolic for HTN and is on lasix as well; compliant w/ all medications.    Past Medical History  Diagnosis Date  . Hypertension   . Coronary artery disease   . Heart murmur     Past Surgical History  Procedure Date  . Tonsillectomy     No family history on file.  History  Substance Use Topics  . Smoking status: Never Smoker   . Smokeless tobacco: Not on file  . Alcohol Use: No    OB History    Grav Para Term Preterm Abortions TAB SAB Ect Mult Living                  Review of Systems  All other systems reviewed and are negative.    Allergies  Penicillins  Home Medications   Current Outpatient Rx  Name Route Sig Dispense Refill  . ALPRAZOLAM 0.25 MG PO TABS Oral Take 0.25 mg by mouth daily as needed. For anxiety     . ARIPIPRAZOLE 2 MG PO TABS Oral Take 1 mg by mouth at bedtime. To help depression and nerves     . ASPIRIN 81 MG PO TABS Oral Take 81 mg by mouth daily. To prevent heart attack     . VITAMIN C PO CHEW Oral Chew by mouth daily.      Marland Kitchen BRIMONIDINE TARTRATE-TIMOLOL 0.2-0.5 % OP SOLN Both Eyes Place 1 drop into both eyes every 12 (twelve) hours.      Marland Kitchen CALCIUM 1200 PO Oral Take by mouth daily.      Marland Kitchen VITAMIN D 2000 UNITS PO TABS Oral  Take 2,000 Units by mouth daily. For vitamin D supplementation     . CYCLOSPORINE 0.05 % OP EMUL  1 drop 2 (two) times daily. In both eyes     . DONEPEZIL HCL 5 MG PO TABS Oral Take 5 mg by mouth daily. To preserve memory     . FUROSEMIDE 40 MG PO TABS Oral Take 40 mg by mouth daily. To help with swelling     . LAMOTRIGINE 150 MG PO TABS Oral Take 150 mg by mouth daily.      Marland Kitchen LEVOTHYROXINE SODIUM 88 MCG PO TABS Oral Take 88 mcg by mouth daily. For thyroid     . MELOXICAM 15 MG PO TABS Oral Take 15 mg by mouth daily. To help pain     . NEBIVOLOL HCL 10 MG PO TABS Oral Take 10 mg by mouth daily. To control blood pressure     . SERTRALINE HCL 50 MG PO TABS Oral Take 50 mg by mouth daily. For depression,nerves and anxiety     . VITAMIN E 200 UNITS PO CAPS Oral  Take 200 Units by mouth daily.        BP 181/58  Pulse 58  Temp(Src) 98.6 F (37 C) (Oral)  Resp 22  Ht 5\' 2"  (1.575 m)  Wt 150 lb (68.04 kg)  BMI 27.44 kg/m2  SpO2 100%  Physical Exam  Nursing note and vitals reviewed. Constitutional: She is oriented to person, place, and time. She appears well-developed and well-nourished. No distress.  HENT:  Head: Normocephalic and atraumatic.  Eyes: No scleral icterus.       Normal appearance  Neck: Normal range of motion.  Cardiovascular: Normal rate, regular rhythm and intact distal pulses.        Hypertensive  Pulmonary/Chest: Effort normal and breath sounds normal. No respiratory distress. She has no rales. She exhibits no tenderness.  Abdominal: Soft. Bowel sounds are normal. She exhibits no distension. There is no tenderness. There is no guarding.       No fluid wave  Musculoskeletal:       Bilateral 2+ pitting edema.  No calf tenderness  Neurological: She is alert and oriented to person, place, and time.  Skin: Skin is warm and dry. No rash noted.  Psychiatric: She has a normal mood and affect. Her behavior is normal.    ED Course  Procedures (including critical care  time)   Date: 01/01/2012  Rate: 58  Rhythm: normal sinus rhythm and premature ventricular contractions (PVC)  QRS Axis: normal  Intervals: normal  ST/T Wave abnormalities: normal  Conduction Disutrbances:none  Narrative Interpretation:   Old EKG Reviewed: unchanged    Labs Reviewed  DIFFERENTIAL - Abnormal; Notable for the following:    Monocytes Relative 15 (*)    Monocytes Absolute 1.6 (*)    All other components within normal limits  COMPREHENSIVE METABOLIC PANEL - Abnormal; Notable for the following:    Creatinine, Ser 1.14 (*)    Total Bilirubin 0.2 (*)    GFR calc non Af Amer 44 (*)    GFR calc Af Amer 50 (*)    All other components within normal limits  CBC  POCT I-STAT TROPONIN I   Dg Chest 2 View  01/01/2012  *RADIOLOGY REPORT*  Clinical Data: The lower extremity swelling, hypertension  CHEST - 2 VIEW  Comparison: Chest x-ray of 10/29/2011  Findings: No active infiltrate or effusion is seen.  Peribronchial thickening is present consistent with bronchitis.  There is mild posterior distribution with blood flow but no edema is seen and no effusion is noted.  The heart is mildly enlarged and stable.  The bones are osteopenic.  IMPRESSION: 1.  Changes of bronchitis.  2.  Also question mild redistribution of pulmonary blood flow.  Original Report Authenticated By: Juline Patch, M.D.     1. Peripheral edema       MDM  78yo F presents w/ c/o HTN and new onset peripheral edema since yesterday.  No known h/o CHF or kidney/liver dz.  Has been eating "chicken pies" from [facility] on a daily basis.  2+ bilateral pitting edema on exam.  No respiratory distress, coughing or rales.  No abdominal fluid wave.  EKG, CXR and labs pending.    EKG non-ischemic and no signs of long-standing uncontrolled HTN.  CXR and labs unremarkable.  All results discussed w/ pt. Sx likely d/t increased sodium intake.  Recommended that she cut back on salt and double furosemide x 3 days.  She has a  f/u appt w/ her PCP in 2 days and will  have BP rechecked at that time.  2:30 PM         Otilio Miu, [place] 01/01/12 1430

## 2012-01-01 NOTE — ED Notes (Signed)
Blue, light green, lavender, and yellow tubes sent to Lab. Dark green tube stored in mini lab.

## 2012-01-01 NOTE — ED Notes (Signed)
Pt reports that she noticed bilateral leg/foot swelling since yesterday. States that she had her blood pressure checked this am and found systolic to be greater than 200. Pt had an appt this afternoon with Dr. Danielle Dess for a epidural to relieve stenosis in her right leg that has caused chronic pain when she stands up and moves around. Pt less mobile the last few days due to increased leg pain. Pt has history of CAD, but denies CHF. Pt placed on cardiac monitor and rainbow tubes collected and sent to lab.

## 2012-01-10 ENCOUNTER — Other Ambulatory Visit: Payer: Self-pay | Admitting: Anesthesiology

## 2012-01-10 DIAGNOSIS — M419 Scoliosis, unspecified: Secondary | ICD-10-CM

## 2012-01-10 DIAGNOSIS — M545 Low back pain: Secondary | ICD-10-CM

## 2012-01-11 ENCOUNTER — Ambulatory Visit
Admission: RE | Admit: 2012-01-11 | Discharge: 2012-01-11 | Disposition: A | Discharge status: Home / Self Care | Payer: Medicare Other | Source: Ambulatory Visit | Attending: Anesthesiology | Admitting: Anesthesiology

## 2012-01-11 DIAGNOSIS — M419 Scoliosis, unspecified: Secondary | ICD-10-CM

## 2012-01-11 DIAGNOSIS — M545 Low back pain: Secondary | ICD-10-CM

## 2012-02-07 ENCOUNTER — Other Ambulatory Visit: Payer: Self-pay | Admitting: Internal Medicine

## 2012-02-07 DIAGNOSIS — R609 Edema, unspecified: Secondary | ICD-10-CM

## 2012-02-08 ENCOUNTER — Ambulatory Visit
Admission: RE | Admit: 2012-02-08 | Discharge: 2012-02-08 | Disposition: A | Discharge status: Home / Self Care | Payer: Medicare Other | Source: Ambulatory Visit | Attending: Internal Medicine | Admitting: Internal Medicine

## 2012-02-08 DIAGNOSIS — R609 Edema, unspecified: Secondary | ICD-10-CM

## 2012-03-12 DIAGNOSIS — R1310 Dysphagia, unspecified: Secondary | ICD-10-CM

## 2012-03-12 HISTORY — DX: Dysphagia, unspecified: R13.10

## 2012-04-12 DIAGNOSIS — M25559 Pain in unspecified hip: Secondary | ICD-10-CM

## 2012-04-12 HISTORY — DX: Pain in unspecified hip: M25.559

## 2012-04-29 ENCOUNTER — Other Ambulatory Visit (HOSPITAL_COMMUNITY): Payer: Self-pay | Admitting: Orthopaedic Surgery

## 2012-04-29 ENCOUNTER — Encounter (HOSPITAL_COMMUNITY): Payer: Self-pay | Admitting: Pharmacy Technician

## 2012-05-02 ENCOUNTER — Encounter (HOSPITAL_COMMUNITY): Payer: Self-pay

## 2012-05-02 ENCOUNTER — Encounter (HOSPITAL_COMMUNITY)
Admission: RE | Admit: 2012-05-02 | Discharge: 2012-05-02 | Disposition: A | Discharge status: Home / Self Care | Payer: Medicare Other | Source: Ambulatory Visit | Attending: Orthopaedic Surgery | Admitting: Orthopaedic Surgery

## 2012-05-02 DIAGNOSIS — E039 Hypothyroidism, unspecified: Secondary | ICD-10-CM

## 2012-05-02 DIAGNOSIS — J189 Pneumonia, unspecified organism: Secondary | ICD-10-CM

## 2012-05-02 DIAGNOSIS — R51 Headache: Secondary | ICD-10-CM

## 2012-05-02 DIAGNOSIS — M1990 Unspecified osteoarthritis, unspecified site: Secondary | ICD-10-CM

## 2012-05-02 HISTORY — DX: Pneumonia, unspecified organism: J18.9

## 2012-05-02 HISTORY — DX: Unspecified osteoarthritis, unspecified site: M19.90

## 2012-05-02 HISTORY — PX: OTHER SURGICAL HISTORY: SHX169

## 2012-05-02 HISTORY — DX: Hypothyroidism, unspecified: E03.9

## 2012-05-02 HISTORY — PX: RETINAL DETACHMENT SURGERY: SHX105

## 2012-05-02 HISTORY — PX: CATARACT EXTRACTION, BILATERAL: SHX1313

## 2012-05-02 HISTORY — DX: Headache: R51

## 2012-05-02 HISTORY — PX: TUBAL LIGATION: SHX77

## 2012-05-02 HISTORY — DX: Major depressive disorder, single episode, unspecified: F32.9

## 2012-05-02 LAB — CBC
Hemoglobin: 12.2 g/dL (ref 12.0–15.0)
MCH: 28.8 pg (ref 26.0–34.0)
MCV: 88.7 fL (ref 78.0–100.0)
Platelets: 427 10*3/uL — ABNORMAL HIGH (ref 150–400)
RBC: 4.24 MIL/uL (ref 3.87–5.11)

## 2012-05-02 LAB — BASIC METABOLIC PANEL
CO2: 29 mEq/L (ref 19–32)
Calcium: 9.4 mg/dL (ref 8.4–10.5)
Glucose, Bld: 85 mg/dL (ref 70–99)
Sodium: 134 mEq/L — ABNORMAL LOW (ref 135–145)

## 2012-05-02 LAB — URINALYSIS, ROUTINE W REFLEX MICROSCOPIC
Bilirubin Urine: NEGATIVE
Glucose, UA: NEGATIVE mg/dL
Hgb urine dipstick: NEGATIVE
Ketones, ur: NEGATIVE mg/dL
pH: 7 (ref 5.0–8.0)

## 2012-05-02 LAB — URINE MICROSCOPIC-ADD ON

## 2012-05-02 NOTE — Pre-Procedure Instructions (Addendum)
05-02-12 EKG/CXR(01-01-12) both reports with chart. Anterior -Hip booklet given. 05-07-12 Spoke with pt. Inquiring of Xanax-instructed pt. To take as usual, may take Am of. 05-07-12 1550 Spouse called to say pt. Took by not remembering Aspirin 81mg  today. Asked to have pt. Not take any more, should not intefer with surgery. W. Estera Ozier,RN 05-08-12 0840 Pt. And daughter made aware surgery time change to 12:30PM , to arrive at 10:00 am . Clear Liquids to 06:00 AM then nothing, other than sip water to take meds. W. Kennon Portela

## 2012-05-02 NOTE — Patient Instructions (Addendum)
20 Jenny [name]  05/02/2012   Your procedure is scheduled on:  6-28 -2013  Report to [facility] at     1230 PM.  Call this number if you have problems the morning of surgery: [phone]   Remember:   Do not eat food:After Midnight.  May have clear liquids:up to 6 Hours before arrival. Nothing after :0900 AM              Clear liquids include soda, tea, black coffee, apple or grape juice, broth.jello.  Take these medicines the morning of surgery with A SIP OF WATER: Lamictal(?), Synthroid, Bystolic, Zoloft. Xanax if desires.  Bring eye drops use as usual   Do not wear jewelry, make-up or nail polish.  Do not wear lotions, powders, or perfumes. You may wear deodorant.  Do not shave 48 hours prior to surgery.(face and neck okay, no shaving of legs)  Do not bring valuables to the hospital.  Contacts, dentures or bridgework may not be worn into surgery.  Leave suitcase in the car. After surgery it may be brought to your room.  For patients admitted to the hospital, checkout time is 11:00 AM the day of discharge.   Patients discharged the day of surgery will not be allowed to drive home.  Name and phone number of your driver: daughter  Special Instructions: CHG Shower Use Special Wash: 1/2 bottle night before surgery and 1/2 bottle morning of surgery.(avoid face and genitals)   Please read over the following fact sheets that you were given: MRSA Information, Blood Transfusion fact sheet, Incentive Spirometry Instruction.

## 2012-05-09 ENCOUNTER — Ambulatory Visit (HOSPITAL_COMMUNITY): Payer: Medicare Other

## 2012-05-09 ENCOUNTER — Inpatient Hospital Stay (HOSPITAL_COMMUNITY)
Admission: RE | Admit: 2012-05-09 | Discharge: 2012-05-14 | DRG: 470 | Disposition: A | Discharge status: Skilled Nursing Facility | Payer: Medicare Other | Source: Ambulatory Visit | Attending: Orthopaedic Surgery | Admitting: Orthopaedic Surgery

## 2012-05-09 ENCOUNTER — Ambulatory Visit (HOSPITAL_COMMUNITY): Payer: Medicare Other | Admitting: Anesthesiology

## 2012-05-09 ENCOUNTER — Encounter (HOSPITAL_COMMUNITY)
Admission: RE | Disposition: A | Discharge status: Skilled Nursing Facility | Payer: Self-pay | Source: Ambulatory Visit | Attending: Orthopaedic Surgery

## 2012-05-09 ENCOUNTER — Encounter (HOSPITAL_COMMUNITY): Payer: Self-pay | Admitting: Anesthesiology

## 2012-05-09 ENCOUNTER — Encounter (HOSPITAL_COMMUNITY): Payer: Self-pay | Admitting: *Deleted

## 2012-05-09 DIAGNOSIS — I10 Essential (primary) hypertension: Secondary | ICD-10-CM | POA: Diagnosis present

## 2012-05-09 DIAGNOSIS — M169 Osteoarthritis of hip, unspecified: Principal | ICD-10-CM

## 2012-05-09 DIAGNOSIS — F329 Major depressive disorder, single episode, unspecified: Secondary | ICD-10-CM | POA: Diagnosis present

## 2012-05-09 DIAGNOSIS — I2510 Atherosclerotic heart disease of native coronary artery without angina pectoris: Secondary | ICD-10-CM | POA: Diagnosis present

## 2012-05-09 DIAGNOSIS — Z01812 Encounter for preprocedural laboratory examination: Secondary | ICD-10-CM

## 2012-05-09 DIAGNOSIS — D62 Acute posthemorrhagic anemia: Secondary | ICD-10-CM | POA: Diagnosis not present

## 2012-05-09 DIAGNOSIS — E039 Hypothyroidism, unspecified: Secondary | ICD-10-CM | POA: Diagnosis present

## 2012-05-09 DIAGNOSIS — F411 Generalized anxiety disorder: Secondary | ICD-10-CM | POA: Diagnosis present

## 2012-05-09 DIAGNOSIS — F3289 Other specified depressive episodes: Secondary | ICD-10-CM | POA: Diagnosis present

## 2012-05-09 DIAGNOSIS — R339 Retention of urine, unspecified: Secondary | ICD-10-CM | POA: Diagnosis not present

## 2012-05-09 DIAGNOSIS — M1610 Unilateral primary osteoarthritis, unspecified hip: Principal | ICD-10-CM | POA: Diagnosis present

## 2012-05-09 HISTORY — PX: TOTAL HIP ARTHROPLASTY: SHX124

## 2012-05-09 SURGERY — ARTHROPLASTY, HIP, TOTAL, ANTERIOR APPROACH
Anesthesia: Spinal | Site: Hip | Laterality: Right | Wound class: Clean

## 2012-05-09 MED ORDER — LACTATED RINGERS IV SOLN: INTRAVENOUS | Status: DC

## 2012-05-09 MED ORDER — BRIMONIDINE TARTRATE 0.2 % OP SOLN
1.0000 [drp] | Freq: Two times a day (BID) | OPHTHALMIC | Status: DC
  Administered 2012-05-09 – 2012-05-14 (×9): 1 [drp] via OPHTHALMIC
  Filled 2012-05-09: qty 5

## 2012-05-09 MED ORDER — SODIUM CHLORIDE 0.9 % IV SOLN
INTRAVENOUS | Status: DC
  Administered 2012-05-09: 18:00:00 via INTRAVENOUS
  Administered 2012-05-10: 1000 mL via INTRAVENOUS

## 2012-05-09 MED ORDER — HYDROMORPHONE HCL PF 1 MG/ML IJ SOLN: 0.2500 mg | INTRAMUSCULAR | Status: DC | PRN

## 2012-05-09 MED ORDER — LEVOTHYROXINE SODIUM 88 MCG PO TABS
88.0000 ug | ORAL_TABLET | Freq: Every day | ORAL | Status: DC
  Administered 2012-05-10 – 2012-05-14 (×5): 88 ug via ORAL
  Filled 2012-05-09 (×6): qty 1

## 2012-05-09 MED ORDER — ASPIRIN EC 325 MG PO TBEC
325.0000 mg | DELAYED_RELEASE_TABLET | Freq: Two times a day (BID) | ORAL | Status: DC
  Administered 2012-05-10 – 2012-05-14 (×9): 325 mg via ORAL
  Filled 2012-05-09 (×12): qty 1

## 2012-05-09 MED ORDER — VITAMIN E 45 MG (100 UNIT) PO CAPS
200.0000 [IU] | ORAL_CAPSULE | Freq: Every day | ORAL | Status: DC
  Administered 2012-05-10 – 2012-05-14 (×5): 200 [IU] via ORAL
  Filled 2012-05-09 (×6): qty 2

## 2012-05-09 MED ORDER — TIMOLOL MALEATE 0.5 % OP SOLN
1.0000 [drp] | Freq: Two times a day (BID) | OPHTHALMIC | Status: DC
  Administered 2012-05-09 – 2012-05-14 (×9): 1 [drp] via OPHTHALMIC
  Filled 2012-05-09: qty 5

## 2012-05-09 MED ORDER — METOCLOPRAMIDE HCL 10 MG PO TABS: 5.0000 mg | ORAL_TABLET | Freq: Three times a day (TID) | ORAL | Status: DC | PRN

## 2012-05-09 MED ORDER — DOCUSATE SODIUM 100 MG PO CAPS
100.0000 mg | ORAL_CAPSULE | Freq: Two times a day (BID) | ORAL | Status: DC
  Administered 2012-05-09 – 2012-05-14 (×10): 100 mg via ORAL

## 2012-05-09 MED ORDER — BRIMONIDINE TARTRATE-TIMOLOL 0.2-0.5 % OP SOLN: 1.0000 [drp] | Freq: Two times a day (BID) | OPHTHALMIC | Status: DC

## 2012-05-09 MED ORDER — FUROSEMIDE 80 MG PO TABS
80.0000 mg | ORAL_TABLET | Freq: Every day | ORAL | Status: DC
  Administered 2012-05-10 – 2012-05-14 (×5): 80 mg via ORAL
  Filled 2012-05-09 (×6): qty 1

## 2012-05-09 MED ORDER — ONDANSETRON HCL 4 MG PO TABS: 4.0000 mg | ORAL_TABLET | Freq: Four times a day (QID) | ORAL | Status: DC | PRN

## 2012-05-09 MED ORDER — PROPOFOL 10 MG/ML IV EMUL
INTRAVENOUS | Status: DC | PRN
  Administered 2012-05-09: 75 ug/kg/min via INTRAVENOUS

## 2012-05-09 MED ORDER — MIDAZOLAM HCL 5 MG/5ML IJ SOLN
INTRAMUSCULAR | Status: DC | PRN
  Administered 2012-05-09: 1 mg via INTRAVENOUS

## 2012-05-09 MED ORDER — BUPIVACAINE HCL (PF) 0.5 % IJ SOLN
INTRAMUSCULAR | Status: DC | PRN
  Administered 2012-05-09: 3 mL

## 2012-05-09 MED ORDER — METOCLOPRAMIDE HCL 5 MG/ML IJ SOLN: 5.0000 mg | Freq: Three times a day (TID) | INTRAMUSCULAR | Status: DC | PRN

## 2012-05-09 MED ORDER — NEBIVOLOL HCL 10 MG PO TABS
10.0000 mg | ORAL_TABLET | Freq: Every day | ORAL | Status: DC
  Administered 2012-05-10 – 2012-05-14 (×5): 10 mg via ORAL
  Filled 2012-05-09 (×6): qty 1

## 2012-05-09 MED ORDER — ACETAMINOPHEN 10 MG/ML IV SOLN
INTRAVENOUS | Status: AC
  Filled 2012-05-09: qty 100

## 2012-05-09 MED ORDER — OXYCODONE HCL 5 MG PO TABS
5.0000 mg | ORAL_TABLET | ORAL | Status: DC | PRN
  Administered 2012-05-09 – 2012-05-10 (×3): 5 mg via ORAL
  Administered 2012-05-10: 10 mg via ORAL
  Administered 2012-05-10 (×2): 5 mg via ORAL
  Administered 2012-05-10: 10 mg via ORAL
  Administered 2012-05-10: 5 mg via ORAL
  Administered 2012-05-11 – 2012-05-13 (×12): 10 mg via ORAL
  Administered 2012-05-14: 5 mg via ORAL
  Administered 2012-05-14 (×2): 10 mg via ORAL
  Administered 2012-05-14: 5 mg via ORAL
  Filled 2012-05-09 (×3): qty 2
  Filled 2012-05-09: qty 1
  Filled 2012-05-09: qty 2
  Filled 2012-05-09: qty 1
  Filled 2012-05-09: qty 2
  Filled 2012-05-09: qty 1
  Filled 2012-05-09 (×10): qty 2
  Filled 2012-05-09 (×2): qty 1
  Filled 2012-05-09 (×4): qty 2

## 2012-05-09 MED ORDER — VITAMIN D3 25 MCG (1000 UNIT) PO TABS
2000.0000 [IU] | ORAL_TABLET | Freq: Every day | ORAL | Status: DC
  Administered 2012-05-10 – 2012-05-14 (×5): 2000 [IU] via ORAL
  Filled 2012-05-09 (×6): qty 2

## 2012-05-09 MED ORDER — LAMOTRIGINE 150 MG PO TABS
150.0000 mg | ORAL_TABLET | Freq: Every day | ORAL | Status: DC
  Administered 2012-05-11 – 2012-05-13 (×3): 150 mg via ORAL
  Filled 2012-05-09 (×6): qty 1

## 2012-05-09 MED ORDER — 0.9 % SODIUM CHLORIDE (POUR BTL) OPTIME
TOPICAL | Status: DC | PRN
  Administered 2012-05-09: 1000 mL

## 2012-05-09 MED ORDER — MORPHINE SULFATE 2 MG/ML IJ SOLN
1.0000 mg | INTRAMUSCULAR | Status: DC | PRN
  Administered 2012-05-09 – 2012-05-11 (×15): 1 mg via INTRAVENOUS
  Filled 2012-05-09 (×5): qty 1
  Filled 2012-05-09: qty 2
  Filled 2012-05-09 (×5): qty 1

## 2012-05-09 MED ORDER — METHOCARBAMOL 100 MG/ML IJ SOLN
500.0000 mg | Freq: Four times a day (QID) | INTRAVENOUS | Status: DC | PRN
  Administered 2012-05-11: 500 mg via INTRAVENOUS
  Filled 2012-05-09 (×2): qty 5

## 2012-05-09 MED ORDER — ZOLPIDEM TARTRATE 5 MG PO TABS: 5.0000 mg | ORAL_TABLET | Freq: Every evening | ORAL | Status: DC | PRN

## 2012-05-09 MED ORDER — ARIPIPRAZOLE 2 MG PO TABS
1.0000 mg | ORAL_TABLET | Freq: Every day | ORAL | Status: DC
  Administered 2012-05-09 – 2012-05-13 (×5): 1 mg via ORAL
  Filled 2012-05-09 (×6): qty 1

## 2012-05-09 MED ORDER — CLINDAMYCIN PHOSPHATE 600 MG/50ML IV SOLN
600.0000 mg | Freq: Four times a day (QID) | INTRAVENOUS | Status: AC
  Administered 2012-05-09 – 2012-05-10 (×2): 600 mg via INTRAVENOUS
  Filled 2012-05-09 (×2): qty 50

## 2012-05-09 MED ORDER — ACETAMINOPHEN 650 MG RE SUPP: 650.0000 mg | Freq: Four times a day (QID) | RECTAL | Status: DC | PRN

## 2012-05-09 MED ORDER — CYCLOSPORINE 0.05 % OP EMUL
1.0000 [drp] | Freq: Two times a day (BID) | OPHTHALMIC | Status: DC
  Administered 2012-05-09 – 2012-05-14 (×10): 1 [drp] via OPHTHALMIC
  Filled 2012-05-09 (×11): qty 1

## 2012-05-09 MED ORDER — ACETAMINOPHEN 10 MG/ML IV SOLN
INTRAVENOUS | Status: DC | PRN
  Administered 2012-05-09: 1000 mg via INTRAVENOUS

## 2012-05-09 MED ORDER — CLINDAMYCIN PHOSPHATE 600 MG/50ML IV SOLN
600.0000 mg | INTRAVENOUS | Status: AC
  Administered 2012-05-09: 600 mg via INTRAVENOUS
  Filled 2012-05-09: qty 50

## 2012-05-09 MED ORDER — ALPRAZOLAM 0.25 MG PO TABS
0.2500 mg | ORAL_TABLET | Freq: Three times a day (TID) | ORAL | Status: DC | PRN
  Administered 2012-05-09 – 2012-05-14 (×8): 0.25 mg via ORAL
  Filled 2012-05-09 (×9): qty 1

## 2012-05-09 MED ORDER — VITAMIN D 50 MCG (2000 UT) PO TABS: 2000.0000 [IU] | ORAL_TABLET | Freq: Every day | ORAL | Status: DC

## 2012-05-09 MED ORDER — PHENYLEPHRINE HCL 10 MG/ML IJ SOLN
10.0000 mg | INTRAVENOUS | Status: DC | PRN
  Administered 2012-05-09: 10 ug/min via INTRAVENOUS
  Administered 2012-05-09: 25 ug/min via INTRAVENOUS

## 2012-05-09 MED ORDER — PROPOFOL 10 MG/ML IV BOLUS
INTRAVENOUS | Status: DC | PRN
  Administered 2012-05-09: 30 mg via INTRAVENOUS
  Administered 2012-05-09: 20 mg via INTRAVENOUS
  Administered 2012-05-09: 50 mg via INTRAVENOUS
  Administered 2012-05-09: 20 mg via INTRAVENOUS

## 2012-05-09 MED ORDER — SERTRALINE HCL 100 MG PO TABS
100.0000 mg | ORAL_TABLET | Freq: Every day | ORAL | Status: DC
  Administered 2012-05-10 – 2012-05-14 (×5): 100 mg via ORAL
  Filled 2012-05-09 (×6): qty 1

## 2012-05-09 MED ORDER — LACTATED RINGERS IV SOLN
INTRAVENOUS | Status: DC
  Administered 2012-05-09: 1000 mL via INTRAVENOUS
  Administered 2012-05-09: 14:00:00 via INTRAVENOUS

## 2012-05-09 MED ORDER — ACETAMINOPHEN 325 MG PO TABS: 650.0000 mg | ORAL_TABLET | Freq: Four times a day (QID) | ORAL | Status: DC | PRN

## 2012-05-09 MED ORDER — HETASTARCH-ELECTROLYTES 6 % IV SOLN
INTRAVENOUS | Status: DC | PRN
  Administered 2012-05-09: 13:00:00 via INTRAVENOUS

## 2012-05-09 MED ORDER — MENTHOL 3 MG MT LOZG: 1.0000 | LOZENGE | OROMUCOSAL | Status: DC | PRN

## 2012-05-09 MED ORDER — ONDANSETRON HCL 4 MG/2ML IJ SOLN
4.0000 mg | Freq: Four times a day (QID) | INTRAMUSCULAR | Status: DC | PRN
  Administered 2012-05-09: 4 mg via INTRAVENOUS
  Filled 2012-05-09: qty 2

## 2012-05-09 MED ORDER — VITAMIN C 500 MG PO TABS
1000.0000 mg | ORAL_TABLET | Freq: Every day | ORAL | Status: DC
  Administered 2012-05-10 – 2012-05-14 (×5): 1000 mg via ORAL
  Filled 2012-05-09 (×6): qty 2

## 2012-05-09 MED ORDER — PHENOL 1.4 % MT LIQD: 1.0000 | OROMUCOSAL | Status: DC | PRN

## 2012-05-09 MED ORDER — MEPERIDINE HCL 50 MG/ML IJ SOLN: 6.2500 mg | INTRAMUSCULAR | Status: DC | PRN

## 2012-05-09 MED ORDER — FENTANYL CITRATE 0.05 MG/ML IJ SOLN
INTRAMUSCULAR | Status: DC | PRN
Start: 2012-05-09 — End: 2012-05-09
  Administered 2012-05-09: 50 ug via INTRAVENOUS

## 2012-05-09 MED ORDER — DIPHENHYDRAMINE HCL 12.5 MG/5ML PO ELIX: 12.5000 mg | ORAL_SOLUTION | ORAL | Status: DC | PRN

## 2012-05-09 MED ORDER — KETOROLAC TROMETHAMINE 15 MG/ML IJ SOLN
7.5000 mg | Freq: Four times a day (QID) | INTRAMUSCULAR | Status: AC
  Administered 2012-05-09 – 2012-05-10 (×4): 7.5 mg via INTRAVENOUS
  Filled 2012-05-09 (×4): qty 1

## 2012-05-09 MED ORDER — PROMETHAZINE HCL 25 MG/ML IJ SOLN: 6.2500 mg | INTRAMUSCULAR | Status: DC | PRN

## 2012-05-09 MED ORDER — METHOCARBAMOL 500 MG PO TABS
500.0000 mg | ORAL_TABLET | Freq: Four times a day (QID) | ORAL | Status: DC | PRN
  Administered 2012-05-09 – 2012-05-14 (×11): 500 mg via ORAL
  Filled 2012-05-09 (×11): qty 1

## 2012-05-09 SURGICAL SUPPLY — 30 items
BAG SPEC THK2 15X12 ZIP CLS (MISCELLANEOUS) ×1
BAG ZIPLOCK 12X15 (MISCELLANEOUS) ×3 IMPLANT
BLADE SAW SGTL 18X1.27X75 (BLADE) ×2 IMPLANT
CELLS DAT CNTRL 66122 CELL SVR (MISCELLANEOUS) ×1 IMPLANT
CLOTH BEACON ORANGE TIMEOUT ST (SAFETY) ×2 IMPLANT
DRAPE C-ARM 42X72 X-RAY (DRAPES) ×2 IMPLANT
DRAPE STERI IOBAN 125X83 (DRAPES) ×2 IMPLANT
DRAPE U-SHAPE 47X51 STRL (DRAPES) ×6 IMPLANT
DRSG MEPILEX BORDER 4X8 (GAUZE/BANDAGES/DRESSINGS) ×2 IMPLANT
DURAPREP 26ML APPLICATOR (WOUND CARE) ×2 IMPLANT
ELECT BLADE TIP CTD 4 INCH (ELECTRODE) ×2 IMPLANT
ELECT REM PT RETURN 9FT ADLT (ELECTROSURGICAL) ×2
ELECTRODE REM PT RTRN 9FT ADLT (ELECTROSURGICAL) ×1 IMPLANT
FACESHIELD LNG OPTICON STERILE (SAFETY) ×7 IMPLANT
GAUZE XEROFORM 1X8 LF (GAUZE/BANDAGES/DRESSINGS) ×2 IMPLANT
GLOVE BIO SURGEON STRL SZ7.5 (GLOVE) ×2 IMPLANT
GLOVE BIOGEL PI IND STRL 8 (GLOVE) ×1 IMPLANT
GLOVE BIOGEL PI INDICATOR 8 (GLOVE) ×1
GOWN STRL REIN XL XLG (GOWN DISPOSABLE) ×4 IMPLANT
KIT BASIN OR (CUSTOM PROCEDURE TRAY) ×2 IMPLANT
PACK TOTAL JOINT (CUSTOM PROCEDURE TRAY) ×2 IMPLANT
PADDING CAST COTTON 6X4 STRL (CAST SUPPLIES) ×2 IMPLANT
RTRCTR WOUND ALEXIS 18CM MED (MISCELLANEOUS) ×2
STAPLER VISISTAT 35W (STAPLE) ×1 IMPLANT
SUT ETHIBOND NAB CT1 #1 30IN (SUTURE) ×3 IMPLANT
SUT VIC AB 2-0 CT1 27 (SUTURE) ×4
SUT VIC AB 2-0 CT1 TAPERPNT 27 (SUTURE) ×2 IMPLANT
TOWEL OR 17X26 10 PK STRL BLUE (TOWEL DISPOSABLE) ×4 IMPLANT
TOWEL OR NON WOVEN STRL DISP B (DISPOSABLE) ×2 IMPLANT
TRAY FOLEY CATH 14FRSI W/METER (CATHETERS) ×2 IMPLANT

## 2012-05-09 NOTE — H&P (Signed)
Jenny [name] is an 78 y.o. female.   Chief Complaint:   Severe right hip pain HPI:   78 yo female with end-stage OA of her right hip with x-rays showing bone-on-bone wear.  She has failed conservative treatment, her pain is daily and her mobility is severely limited.  The goals of surgery are decreased pain and improved mobility.  The risks are mainly bleeding, infection, nerve injury, fracture and DVT.  She gives informed consent for a right total hip replacement.  Past Medical History  Diagnosis Date  . Hypertension   . Coronary artery disease   . Heart murmur   . Hypothyroidism 05-02-12    tx. Synthroid  . Depression 05-02-12     depression only,tx. oral meds  . Headache 05-02-12    past hx. migrianes-many yrs ago  . Arthritis 05-02-12    osteoarthritis-rt. hip  . Pneumonia 05-02-12    mild case    Past Surgical History  Procedure Date  . Tonsillectomy   . Tubal ligation 05-02-12  .  tummy tuck 05-02-12    many yrs ago  . Cataract extraction, bilateral 05-02-12    bilateral  . Retinal detachment surgery 05-02-12    left    History reviewed. No pertinent family history. Social History:  reports that she has never smoked. She does not have any smokeless tobacco history on file. She reports that she does not drink alcohol or use illicit drugs.  Allergies:  Allergies  Allergen Reactions  . Penicillins Rash    Medications Prior to Admission  Medication Sig Dispense Refill  . acetaminophen (TYLENOL) 325 MG tablet Take 650 mg by mouth every 6 (six) hours as needed.      . ALPRAZolam (XANAX) 0.25 MG tablet Take 0.25 mg by mouth 3 (three) times daily as needed. For anxiety      . ARIPiprazole (ABILIFY) 2 MG tablet Take 1 mg by mouth at bedtime. To help depression and nerves      . brimonidine-timolol (COMBIGAN) 0.2-0.5 % ophthalmic solution Place 1 drop into both eyes every 12 (twelve) hours.        . cycloSPORINE (RESTASIS) 0.05 % ophthalmic emulsion Place 1 drop into both eyes 2  (two) times daily. In both eyes      . furosemide (LASIX) 40 MG tablet Take 80 mg by mouth daily with breakfast. To help with swelling      . lamoTRIgine (LAMICTAL) 150 MG tablet Take 150 mg by mouth daily with breakfast.       . levothyroxine (SYNTHROID, LEVOTHROID) 88 MCG tablet Take 88 mcg by mouth daily with breakfast. For thyroid      . nebivolol (BYSTOLIC) 10 MG tablet Take 10 mg by mouth daily with breakfast. To control blood pressure      . sertraline (ZOLOFT) 50 MG tablet Take 100 mg by mouth daily with breakfast. For depression,nerves and anxiety      . Ascorbic Acid (VITAMIN C) 1000 MG tablet Take 1,000 mg by mouth daily with breakfast.      . aspirin 81 MG tablet Take 81 mg by mouth daily with breakfast. To prevent heart attack      . Calcium Carbonate-Vit D-Min (CALCIUM 1200 PO) Take 1 tablet by mouth daily.       . Cholecalciferol (VITAMIN D) 2000 UNITS tablet Take 2,000 Units by mouth daily. For vitamin D supplementation      . vitamin E 200 UNIT capsule Take 200 Units by mouth daily with  breakfast.         Results for orders placed during the hospital encounter of 05/09/12 (from the past 48 hour(s))  TYPE AND SCREEN     Status: Normal   Collection Time   05/09/12 10:20 AM      Component Value Range Comment   ABO/RH(D) O POS      Antibody Screen NEG      Sample Expiration 05/12/2012     ABO/RH     Status: Normal   Collection Time   05/09/12 10:30 AM      Component Value Range Comment   ABO/RH(D) O POS      No results found.  Review of Systems  All other systems reviewed and are negative.    Blood pressure 137/56, pulse 60, temperature 98.2 F (36.8 C), resp. rate 20, SpO2 95.00%. Physical Exam  Constitutional: She is oriented to person, place, and time. She appears well-developed and well-nourished.  HENT:  Head: Normocephalic and atraumatic.  Eyes: EOM are normal. Pupils are equal, round, and reactive to light.  Neck: Normal range of motion. Neck supple.    Cardiovascular: Normal rate and regular rhythm.   Respiratory: Effort normal and breath sounds normal.  GI: Soft. Bowel sounds are normal.  Musculoskeletal:       Right hip: She exhibits decreased range of motion, decreased strength and bony tenderness.  Neurological: She is alert and oriented to person, place, and time.  Skin: Skin is warm and dry.  Psychiatric: She has a normal mood and affect.     Assessment/Plan Right hip severe end-stage arthritis/DJD 1) to the OR today for a right total hip replacement.  Kathryne Hitch 05/09/2012, 12:22 PM

## 2012-05-09 NOTE — Transfer of Care (Signed)
Immediate Anesthesia Transfer of Care Note  Patient: Jenny [name]  Procedure(s) Performed: Procedure(s) (LRB): TOTAL HIP ARTHROPLASTY ANTERIOR APPROACH (Right)  Patient Location: PACU  Anesthesia Type: Spinal  Level of Consciousness: awake, alert , oriented and patient cooperative  Airway & Oxygen Therapy: Patient Spontanous Breathing and Patient connected to face mask oxygen  Post-op Assessment: Report given to PACU RN and Post -op Vital signs reviewed and stable  Post vital signs: stable  Complications: No apparent anesthesia complications  Spinal level L10

## 2012-05-09 NOTE — Brief Op Note (Signed)
05/09/2012  2:48 PM  PATIENT:  Jenny [name]  78 y.o. female  PRE-OPERATIVE DIAGNOSIS:  Severe osteoarthritis right hip  POST-OPERATIVE DIAGNOSIS:  Severe osteoarthritis right hip  PROCEDURE:  Procedure(s) (LRB): TOTAL HIP ARTHROPLASTY ANTERIOR APPROACH (Right)  SURGEON:  Surgeon(s) and Role:    * Kathryne Hitch, MD - Primary  PHYSICIAN ASSISTANT:   ASSISTANTS: none   ANESTHESIA:   spinal  EBL:  Total I/O In: 1500 [I.V.:1000; IV Piggyback:500] Out: 800 [Urine:300; Blood:500]  BLOOD ADMINISTERED:none  DRAINS: none   LOCAL MEDICATIONS USED:  NONE  SPECIMEN:  No Specimen  DISPOSITION OF SPECIMEN:  N/A  COUNTS:  YES  TOURNIQUET:  * No tourniquets in log *  DICTATION: .Other Dictation: Dictation Number [id number]  PLAN OF CARE: Admit to inpatient   PATIENT DISPOSITION:  PACU - hemodynamically stable.   Delay start of Pharmacological VTE agent (>24hrs) due to surgical blood loss or risk of bleeding: no

## 2012-05-09 NOTE — Progress Notes (Signed)
Lt arm c bruising  States from having iv at oral surgeons .  Also bruising noted after lab draw here this day to rt lower forearm  Pressure bandage applied

## 2012-05-09 NOTE — Anesthesia Preprocedure Evaluation (Signed)
Anesthesia Evaluation  Patient identified by MRN, date of birth, ID band Patient awake    Reviewed: Allergy & Precautions, H&P , NPO status , Patient's Chart, lab work & pertinent test results  Airway Mallampati: II TM Distance: >3 FB Neck ROM: Full    Dental No notable dental hx.    Pulmonary neg pulmonary ROS,  breath sounds clear to auscultation  Pulmonary exam normal       Cardiovascular hypertension, Pt. on medications + CAD negative cardio ROS  Rhythm:Regular Rate:Normal     Neuro/Psych PSYCHIATRIC DISORDERS Anxiety Depression dementianegative neurological ROS  negative psych ROS   GI/Hepatic negative GI ROS, Neg liver ROS,   Endo/Other  negative endocrine ROSHypothyroidism   Renal/GU negative Renal ROS  negative genitourinary   Musculoskeletal negative musculoskeletal ROS (+)   Abdominal   Peds negative pediatric ROS (+)  Hematology negative hematology ROS (+)   Anesthesia Other Findings   Reproductive/Obstetrics negative OB ROS                           Anesthesia Physical Anesthesia Plan  ASA: III  Anesthesia Plan: Spinal   Post-op Pain Management:    Induction: Intravenous  Airway Management Planned:   Additional Equipment:   Intra-op Plan:   Post-operative Plan:   Informed Consent: I have reviewed the patients History and Physical, chart, labs and discussed the procedure including the risks, benefits and alternatives for the proposed anesthesia with the patient or authorized representative who has indicated his/her understanding and acceptance.   Dental advisory given  Plan Discussed with: CRNA  Anesthesia Plan Comments:         Anesthesia Quick Evaluation

## 2012-05-09 NOTE — Anesthesia Procedure Notes (Signed)
Spinal  Patient location during procedure: OR Start time: 05/09/2012 12:47 PM End time: 05/09/2012 12:55 PM Staffing CRNA/Resident: Lundon Verdejo E Performed by: resident/CRNA  Preanesthetic Checklist Completed: patient identified, site marked, surgical consent, pre-op evaluation, timeout performed, IV checked, risks and benefits discussed and monitors and equipment checked Spinal Block Patient position: sitting Prep: Betadine Patient monitoring: heart rate, cardiac monitor, continuous pulse ox and blood pressure Approach: right paramedian Location: L2-3 Injection technique: single-shot Needle Needle gauge: 22 G Needle length: 9 cm Needle insertion depth: 3 cm Additional Notes Pt tolerated procedure well. CSFx3, -heme - paresthesia. [id number]/ expires 9/14

## 2012-05-09 NOTE — Anesthesia Postprocedure Evaluation (Signed)
  Anesthesia Post-op Note  Patient: Jenny [name]  Procedure(s) Performed: Procedure(s) (LRB): TOTAL HIP ARTHROPLASTY ANTERIOR APPROACH (Right)  Patient Location: PACU  Anesthesia Type: Spinal  Level of Consciousness: awake and alert   Airway and Oxygen Therapy: Patient Spontanous Breathing  Post-op Pain: mild  Post-op Assessment: Post-op Vital signs reviewed, Patient's Cardiovascular Status Stable, Respiratory Function Stable, Patent Airway and No signs of Nausea or vomiting  Post-op Vital Signs: stable  Complications: No apparent anesthesia complications

## 2012-05-09 NOTE — Plan of Care (Signed)
Problem: Consults Goal: Diagnosis- Total Joint Replacement Primary Total Hip     

## 2012-05-10 LAB — BASIC METABOLIC PANEL
GFR calc Af Amer: 72 mL/min — ABNORMAL LOW (ref >90)
GFR calc non Af Amer: 62 mL/min — ABNORMAL LOW (ref >90)
Glucose, Bld: 158 mg/dL — ABNORMAL HIGH (ref 70–99)
Potassium: 4.4 mEq/L (ref 3.5–5.1)
Sodium: 125 mEq/L — ABNORMAL LOW (ref 135–145)

## 2012-05-10 LAB — CBC
Hemoglobin: 7.8 g/dL — ABNORMAL LOW (ref 12.0–15.0)
MCHC: 33.8 g/dL (ref 30.0–36.0)
Platelets: 335 10*3/uL (ref 150–400)
RDW: 14.3 % (ref 11.5–15.5)

## 2012-05-10 NOTE — Progress Notes (Signed)
Pt not able to void, bladder scanned for 935cc. I&O cath obtained 1200cc. Tolerated well. Jenny [name], [employer]

## 2012-05-10 NOTE — Progress Notes (Signed)
Physical Therapy Treatment Patient Details Name: Jenny [name] MRN: [id number] DOB: [birth year] Today's Date: 05/10/2012 Time: 1355-1410 PT Time Calculation (min): 15 min  PT Assessment / Plan / Recommendation Comments on Treatment Session  Pt reported no pain with exercises. Heels elevated on pillows with SCD hose reapplied per nursing request. Pt and daughter educated on performing ankle pumps hourly for improved circulation in legs.    Follow Up Recommendations  Skilled nursing facility       Equipment Recommendations  None recommended by PT    Recommendations for Other Services OT consult  Frequency 7X/week   Plan Discharge plan remains appropriate;Frequency remains appropriate    Precautions / Restrictions Precautions Precautions: None Precaution Comments: Direct Anterior THA Restrictions Weight Bearing Restrictions: No RLE Weight Bearing: Weight bearing as tolerated Other Position/Activity Restrictions: WBAT    Pertinent Vitals/Pain Pt reported no pain with there ex this pm session, before or after. Cryo in place on right hip at end of session.       Exercises Total Joint Exercises Ankle Circles/Pumps: AROM;Strengthening;Both;10 reps;Supine Quad Sets: AROM;Strengthening;Right;10 reps;Supine Short Arc Quad: AAROM;Strengthening;Right;10 reps;Supine Heel Slides: AAROM;Strengthening;Right;10 reps;Supine Hip ABduction/ADduction: AAROM;Strengthening;Right;10 reps;Supine      PT Goals Acute Rehab PT Goals PT Goal Formulation: With patient/family Time For Goal Achievement: 05/17/12 Potential to Achieve Goals: Good Pt will go Supine/Side to Sit: with min assist PT Goal: Supine/Side to Sit - Progress: Goal set today Pt will go Sit to Stand: with min assist PT Goal: Sit to Stand - Progress: Goal set today Pt will Ambulate: 51 - 150 feet;with min assist PT Goal: Ambulate - Progress: Goal set today Pt will Perform Home Exercise Program: with min assist PT Goal: Perform  Home Exercise Program - Progress: Progressing toward goal  Visit Information  Last PT Received On: 05/10/12 Assistance Needed: +2    Subjective Data  Subjective: Reports feeling much better than this morning with better pain control. Agreeable to ther ex in bed for the afternoon session.   Cognition  Overall Cognitive Status: Appears within functional limits for tasks assessed/performed Arousal/Alertness: Awake/alert (did doze off a couple of times, easily rearoused) Orientation Level: Appears intact for tasks assessed Behavior During Session: [facility] for tasks performed       End of Session PT - End of Session Equipment Utilized During Treatment: Gait belt Activity Tolerance: Patient tolerated treatment well Patient left: in bed;with call bell/phone within reach;with family/visitor present Nurse Communication: Mobility status   Sallyanne Kuster 05/10/2012, 2:29 PM  Sallyanne Kuster, PTA Office- [phone]

## 2012-05-10 NOTE — Evaluation (Addendum)
Physical Therapy Evaluation Patient Details Name: Jenny [name] MRN: [id number] DOB: 1927/08/11 Today's Date: 05/10/2012 Time: 0915-0950 PT Time Calculation (min): 35 min  PT Assessment / Plan / Recommendation Clinical Impression  Pt is a very pleasant 78 y.o. female who is POD #1 for anterior THA on the R. Pt had 10/10 pain with bed to chair transfer with assist of 2. Pain limiting activity tolerance. Pt plans to DC to SNF then return to independent living at [facility].     PT Assessment    decreased mobility, acute pain, difficulty walking   Follow Up Recommendations    SNF   Barriers to Discharge        Equipment Recommendations    None recommended by PT   Recommendations for Other Services   OT Consult  Frequency   7x/week   Precautions / Restrictions Precautions Precautions: None Precaution Comments: direct anterior THA Restrictions Weight Bearing Restrictions: No Other Position/Activity Restrictions: WBAT   Pertinent Vitals/Pain *10/10 R hip with movement Ice applied, RN notified**      Mobility  Bed Mobility Bed Mobility: Supine to Sit Supine to Sit: 1: +2 Total assist Supine to Sit: Patient Percentage: 20% Details for Bed Mobility Assistance: activity tolerance limited by pain Transfers Transfers: Sit to Stand;Stand to Sit Sit to Stand: 1: +2 Total assist;From bed Sit to Stand: Patient Percentage: 40% Stand to Sit: 1: +2 Total assist;To chair/3-in-1 Stand to Sit: Patient Percentage: 60% Ambulation/Gait Ambulation/Gait Assistance: 1: +2 Total assist Ambulation/Gait: Patient Percentage: 70% Ambulation Distance (Feet): 5 Feet Assistive device: Rolling walker Ambulation/Gait Assistance Details: assist to advance RLE and to maneuver RW Gait Pattern: Step-to pattern;Decreased step length - right;Decreased step length - left    Exercises Total Joint Exercises Ankle Circles/Pumps: AROM;Both;10 reps;Supine Heel Slides: AAROM;Right;10 reps;Supine Hip  ABduction/ADduction: AAROM;Right;10 reps;Supine   PT Diagnosis:    PT Problem List:   PT Treatment Interventions:     PT Goals Acute Rehab PT Goals PT Goal Formulation: With patient/family Time For Goal Achievement: 05/17/12 Potential to Achieve Goals: Good Pt will go Supine/Side to Sit: with min assist PT Goal: Supine/Side to Sit - Progress: Goal set today Pt will go Sit to Stand: with min assist PT Goal: Sit to Stand - Progress: Goal set today Pt will Ambulate: 51 - 150 feet;with min assist PT Goal: Ambulate - Progress: Goal set today Pt will Perform Home Exercise Program: with min assist PT Goal: Perform Home Exercise Program - Progress: Goal set today  Visit Information  Last PT Received On: 05/10/12 Assistance Needed: +2    Subjective Data  Subjective: Oh, it hurts! Patient Stated Goal: return to PLOF   Prior Functioning  Home Living Lives With: Spouse Available Help at Discharge: Family Type of Home: Apartment (pt lives at [facility] independent living, will DC to SNF) Home Access: Level entry Home Layout: One level Bathroom Shower/Tub: Fish farm manager Equipment: Grab bars in shower;Grab bars around toilet;Walker - four wheeled Prior Function Level of Independence: Independent with assistive device(s) (used 4 wheeled RW) Vocation: Retired Musician: No difficulties    Cognition  Overall Cognitive Status: Appears within functional limits for tasks assessed/performed Arousal/Alertness: Awake/alert Orientation Level: Appears intact for tasks assessed Behavior During Session: [facility] for tasks performed    Extremity/Trunk Assessment Right Upper Extremity Assessment RUE ROM/Strength/Tone: [facility] for tasks assessed Left Upper Extremity Assessment LUE ROM/Strength/Tone: WFL for tasks assessed Right Lower Extremity Assessment RLE ROM/Strength/Tone: Deficits;Due to pain;Unable to fully assess RLE ROM/Strength/Tone  Deficits: All movement  very painful RLE Sensation: WFL - Light Touch Left Lower Extremity Assessment LLE ROM/Strength/Tone: Within functional levels LLE Sensation: WFL - Light Touch LLE Coordination: WFL - gross/fine motor Trunk Assessment Trunk Assessment: Normal   Balance    End of Session PT - End of Session Equipment Utilized During Treatment: Gait belt Activity Tolerance: Patient limited by pain Patient left: in chair;with call bell/phone within reach;with family/visitor present Nurse Communication: Mobility status  GP     Ralene Bathe Kistler 05/10/2012, 10:39 AM [phone]

## 2012-05-10 NOTE — Op Note (Signed)
NAMEVERNIECE, Jenny NO.:  [name]  MEDICAL RECORD NO.:  [id number]  LOCATION:  1605                         FACILITY:  [facility]  PHYSICIAN:  Vanita Panda. Magnus Ivan, M.D.DATE OF BIRTH:  03-Dec-1926  DATE OF PROCEDURE:  05/09/2012 DATE OF DISCHARGE:                              OPERATIVE REPORT   PREOPERATIVE DIAGNOSES:  End-stage arthritis and degenerative joint disease, right hip.  POSTOPERATIVE DIAGNOSES:  End-stage arthritis and degenerative joint disease, right hip.  PROCEDURE:  Right total hip arthroplasty through direct anterior approach.  IMPLANTS:  DePuy sector Gription acetabular component size 48, size 32+ 4 neutral polyethylene liner, size 12 Corail femoral component with standard offset, size 32+ 1 metal hip ball.  SURGEON:  Vanita Panda. Magnus Ivan, M.D.  ANESTHESIA:  Spinal.  BLOOD LOSS:  500 mL.  ANTIBIOTICS:  600 mg IV clindamycin.  COMPLICATIONS:  None.  INDICATIONS:  Jenny [name] is a very pleasant 78 year old female with debilitating arthritis involving her right hip.  At this point, it did impact on her activities of daily living.  She wished to proceed with a total hip arthroplasty.  The risks and benefits of surgery have been explained to her in detail.  She does wish to proceed with surgery.  DESCRIPTION OF PROCEDURE:  After informed consent was obtained, appropriate right hip was marked.  She was brought to the operating room and spinal anesthesia was obtained.  A Foley catheter was then placed and then both feet were placed in traction boots.  She was then placed supine on the Hana fracture table with the perineal post in place and both feet in inline skeletal traction but no traction applied.  The right hip was then prepped and draped with DuraPrep and sterile drapes. Time-out called.  She was identified as the correct patient and correct right hip.  I then made an incision just inferior and posterior to the anterosuperior  iliac spine and carried this obliquely down the leg.  I dissected down to the tensor fascia lata which was divided obliquely.  I then proceeded with a direct anterior approach to the hip.  Cobra retractors were placed around the lateral neck and then one up underneath the rectus femoris on the medial neck.  I cauterized the lateral femoral circumflex vessels and then divided the hip capsule and placed the Cobra retractors within the hip capsule.  I then used an oscillating saw to make my femoral neck cut just proximal to the lesser trochanter.  I completed this with an osteotome and then removed the femoral head in its entirety.  I placed a bent Hohmann medially and a Cobra retractor laterally.  I cleaned the remnants of the labrum from the acetabulum and debride deep within and then began reaming from a size 42 reamer in 2 mm increments all the way to a 48 with all reamers placed under direct visualization.  The last reamer was placed also under direct fluoroscopy, so I could obtain my depth of reaming my inclination, and anteversion.  I then placed the real size 48 sector Gription acetabular component from DePuy.  I placed the apex hole eliminator guide and then the real neutral 32+ 4  polyethylene liner. Attention was then turned to the femur with the leg externally rotated to 90 degrees.  It was extended and adducted.  This allowed access to the femoral canal.  I placed a Mueller retractor medially and a bent Hohmann around the greater trochanter.  I released the lateral capsule and piriformis and then began broaching from a size 8 broach up to a size 12.  The 12 was felt to be stable size trial standard neck and a 32+ 1 hip ball.  We brought the leg back over and up and then brought it into the acetabulum to reduce the hip.  Her leg lengths were near equal. She was stable with internal external rotation as well.  We then redislocated the hip and removed all trial components.  I placed  the real Corail size 12 femoral component followed by the real 32+ 1 metal hip ball.  We reduced this back in the acetabulum that was stable, so I copiously irrigated the soft tissues with normal saline solution.  I closed the joint capsule with interrupted #1 Ethibond followed by running 0 V lock suture on the tensor fascia lata, 2-0 Vicryl on subcutaneous tissue, and staples on the skin.  Well-padded sterile dressing was applied.  She was taken off the Hana table into the recovery room in stable condition.  All final counts were correct. There were no complications noted.     Vanita Panda. Magnus Ivan, M.D.     CYB/MEDQ  D:  05/09/2012  T:  05/10/2012  Job:  [id number]

## 2012-05-10 NOTE — Progress Notes (Signed)
Subjective: 1 Day Post-Op Procedure(s) (LRB): TOTAL HIP ARTHROPLASTY ANTERIOR APPROACH (Right) Patient reports pain as mild.    Objective: Vital signs in last 24 hours: Temp:  [97.4 F (36.3 C)-98.2 F (36.8 C)] 97.6 F (36.4 C) (06/29 0610) Pulse Rate:  [52-64] 64  (06/29 0610) Resp:  [12-20] 16  (06/29 0610) BP: (109-169)/(36-85) 147/72 mmHg (06/29 0610) SpO2:  [95 %-100 %] 99 % (06/29 0610) Weight:  [71.215 kg (157 lb)] 71.215 kg (157 lb) (06/28 1900)  Intake/Output from previous day: 06/28 0701 - 06/29 0700 In: 3773.8 [P.O.:480; I.V.:2793.8; IV Piggyback:500] Out: 1950 [Urine:1450; Blood:500] Intake/Output this shift:     Basename 05/10/12 0411  HGB 7.8*    Basename 05/10/12 0411  WBC 10.1  RBC 2.66*  HCT 23.1*  PLT 335    Basename 05/10/12 0411  NA 125*  K 4.4  CL 95*  CO2 26  BUN 12  CREATININE 0.85  GLUCOSE 158*  CALCIUM 8.3*   No results found for this basename: LABPT:2,INR:2 in the last 72 hours  Neurologically intact  Assessment/Plan: 1 Day Post-Op Procedure(s) (LRB): TOTAL HIP ARTHROPLASTY ANTERIOR APPROACH (Right) Up with therapy Hgb- 7.8--- will watch  Nely Dedmon V 05/10/2012, 7:33 AM

## 2012-05-10 NOTE — Progress Notes (Signed)
Chart reviewed and spoke with PT.  Pt. Plans to go to SNF at [facility] upon discharge.  Will defer OT eval to SNF. If D/C plan changes, will initiate eval.  Jeani Hawking, OTR/L [phone]

## 2012-05-11 ENCOUNTER — Inpatient Hospital Stay (HOSPITAL_COMMUNITY): Payer: Medicare Other

## 2012-05-11 LAB — PREPARE RBC (CROSSMATCH)

## 2012-05-11 LAB — CBC
Hemoglobin: 7.4 g/dL — ABNORMAL LOW (ref 12.0–15.0)
Platelets: 311 10*3/uL (ref 150–400)
RBC: 2.52 MIL/uL — ABNORMAL LOW (ref 3.87–5.11)
WBC: 11.9 10*3/uL — ABNORMAL HIGH (ref 4.0–10.5)

## 2012-05-11 MED ORDER — BLISTEX EX OINT
TOPICAL_OINTMENT | CUTANEOUS | Status: AC
  Filled 2012-05-11: qty 10

## 2012-05-11 MED ORDER — FUROSEMIDE 10 MG/ML IJ SOLN
20.0000 mg | Freq: Once | INTRAMUSCULAR | Status: AC
  Administered 2012-05-11: 20 mg via INTRAVENOUS
  Filled 2012-05-11: qty 2

## 2012-05-11 NOTE — Progress Notes (Signed)
Chaplain note: Page came directly from patient's daughter who works in the system.  The patient has had a hip replacement Friday, 6/28.  She was just in need of encouragement through her pain.  We exchanged stories, talked about the arduous regiment ahead and her positive attitude.  She is well connected in church and had church visitors this Sunday.  Her pastor is presently out of town.  Chaplain thinks it was the [employer], or something similar in name.  The patient is getting a blood transfusion tonight to help with low hemoglobin.  She is beginning to eat again, and as stated, her attitude is very good.  She like company.  Patient is 44 1/2 and has been married 63-64 years (she couldn't remember exactly), but smiled bug when the Chaplain said, "It doesn't matter; it's been forever.  Her responses are appropriate and she is a pleasant patient to talk with.  She would enjoy a Chaplain visit, but it isn't necessary.  She is very responive to prayer, and that can be the purpose of any visit.  This Chaplain said to ask for a Chaplain should she feel at all discouraged or defeated in her process.  She has two daughters, one son, her husband Fredrik [place]?), and grandchildren at least (age 70 - 62).  No greats.  Rema Jasmine, Chaplain Pager: [phone]

## 2012-05-11 NOTE — Progress Notes (Signed)
Physical Therapy Treatment Patient Details Name: Jenny [name] MRN: [id number] DOB: January 12, 1927 Today's Date: 05/11/2012 Time: 1130-1200 PT Time Calculation (min): 30 min  PT Assessment / Plan / Recommendation Comments on Treatment Session  POD #2  for R direct anterior THA. Pt has severe 10/10 R hip/thigh pain with any movement. Supine to sit with +2 total assist. Pt sat on EOB x 7 min with waves of 10/10 pain sitting still. Unable to tolerate transfer to chair or ambulation.     Follow Up Recommendations  Skilled nursing facility    Barriers to Discharge        Equipment Recommendations  Defer to next venue    Recommendations for Other Services    Frequency 7X/week   Plan Discharge plan remains appropriate    Precautions / Restrictions     Pertinent Vitals/Pain *10/10 R hip with movement Ice applied, pt premedicated, repositioned**    Mobility  Bed Mobility Bed Mobility: Supine to Sit Supine to Sit: 1: +2 Total assist Supine to Sit: Patient Percentage: 20% Details for Bed Mobility Assistance: activity tolerance limited by pain Transfers Sit to Stand: From bed Details for Transfer Assistance: unable to tolerate transfer due to 10/10 pain sitting on EOB    Exercises Total Joint Exercises Ankle Circles/Pumps: AROM;Strengthening;Both;10 reps;Supine Heel Slides: AAROM;Strengthening;Right;10 reps;Supine Hip ABduction/ADduction: AAROM;Strengthening;Right;10 reps;Supine   PT Diagnosis:    PT Problem List:   PT Treatment Interventions:     PT Goals Acute Rehab PT Goals PT Goal Formulation: With patient/family Time For Goal Achievement: 05/17/12 Potential to Achieve Goals: Good Pt will go Supine/Side to Sit: with min assist PT Goal: Supine/Side to Sit - Progress: Not progressing (due to pain) Pt will go Sit to Stand: with min assist Pt will Ambulate: 51 - 150 feet;with min assist Pt will Perform Home Exercise Program: with min assist  Visit Information  Last PT  Received On: 05/11/12 Assistance Needed: +2    Subjective Data  Subjective: It hurts so much! Patient Stated Goal: be able to move   Cognition  Overall Cognitive Status: Appears within functional limits for tasks assessed/performed Arousal/Alertness: Awake/alert (did doze off a couple of times, easily rearoused) Orientation Level: Appears intact for tasks assessed Behavior During Session: [facility] for tasks performed    Balance     End of Session PT - End of Session Activity Tolerance: Patient limited by pain Patient left: in bed Nurse Communication: Mobility status   GP     Ralene Bathe Kistler 05/11/2012, 12:11 PM [phone]

## 2012-05-11 NOTE — Progress Notes (Addendum)
CSW received referral. Assessment to follow.   Leron Croak, LCSWA [facility] [employer] Coverage [phone]  CSW provided facility listing.  Leron Croak, LCSWA [employer] Coverage [phone]

## 2012-05-11 NOTE — Progress Notes (Signed)
Subjective: 2 Days Post-Op Procedure(s) (LRB): TOTAL HIP ARTHROPLASTY ANTERIOR APPROACH (Right) Patient reports pain as moderate.    Objective: Vital signs in last 24 hours: Temp:  [98.1 F (36.7 C)-99 F (37.2 C)] 98.1 F (36.7 C) (06/30 0415) Pulse Rate:  [61-66] 63  (06/30 0415) Resp:  [16] 16  (06/30 0415) BP: (102-145)/(61-79) 145/79 mmHg (06/30 0415) SpO2:  [87 %-99 %] 99 % (06/30 0415)  Intake/Output from previous day: 06/29 0701 - 06/30 0700 In: 1545 [I.[name].:1545] Out: 1850 [Urine:1850] Intake/Output this shift: Total I/O In: 1545 [I.[name].:1545] Out: 650 [Urine:650]   Basename 05/11/12 0430 05/10/12 0411  HGB 7.4* 7.8*    Basename 05/11/12 0430 05/10/12 0411  WBC 11.9* 10.1  RBC 2.52* 2.66*  HCT 21.9* 23.1*  PLT 311 335    Basename 05/10/12 0411  NA 125*  K 4.4  CL 95*  CO2 26  BUN 12  CREATININE 0.85  GLUCOSE 158*  CALCIUM 8.3*   No results found for this basename: LABPT:2,INR:2 in the last 72 hours  Neurologically intact  Assessment/Plan: 2 Days Post-Op Procedure(s) (LRB): TOTAL HIP ARTHROPLASTY ANTERIOR APPROACH (Right) Up with therapy Slow with PT c/o increased pain, Hgb- 7.4, D/C IVF  Jenny [name] 05/11/2012, 6:17 AM

## 2012-05-12 DIAGNOSIS — J189 Pneumonia, unspecified organism: Secondary | ICD-10-CM

## 2012-05-12 DIAGNOSIS — L89309 Pressure ulcer of unspecified buttock, unspecified stage: Secondary | ICD-10-CM

## 2012-05-12 DIAGNOSIS — E871 Hypo-osmolality and hyponatremia: Secondary | ICD-10-CM

## 2012-05-12 DIAGNOSIS — I509 Heart failure, unspecified: Secondary | ICD-10-CM

## 2012-05-12 DIAGNOSIS — D62 Acute posthemorrhagic anemia: Secondary | ICD-10-CM

## 2012-05-12 DIAGNOSIS — R0902 Hypoxemia: Secondary | ICD-10-CM

## 2012-05-12 DIAGNOSIS — I2510 Atherosclerotic heart disease of native coronary artery without angina pectoris: Secondary | ICD-10-CM

## 2012-05-12 HISTORY — DX: Heart failure, unspecified: I50.9

## 2012-05-12 HISTORY — DX: Acute posthemorrhagic anemia: D62

## 2012-05-12 HISTORY — DX: Atherosclerotic heart disease of native coronary artery without angina pectoris: I25.10

## 2012-05-12 HISTORY — DX: Hypoxemia: R09.02

## 2012-05-12 HISTORY — DX: Pneumonia, unspecified organism: J18.9

## 2012-05-12 HISTORY — DX: Hypo-osmolality and hyponatremia: E87.1

## 2012-05-12 HISTORY — DX: Pressure ulcer of unspecified buttock, unspecified stage: L89.309

## 2012-05-12 LAB — TYPE AND SCREEN
ABO/RH(D): O POS
Antibody Screen: NEGATIVE
Unit division: 0
Unit division: 0

## 2012-05-12 LAB — CBC
HCT: 27.4 % — ABNORMAL LOW (ref 36.0–46.0)
Hemoglobin: 9.5 g/dL — ABNORMAL LOW (ref 12.0–15.0)
RBC: 3.17 MIL/uL — ABNORMAL LOW (ref 3.87–5.11)
WBC: 12.6 10*3/uL — ABNORMAL HIGH (ref 4.0–10.5)

## 2012-05-12 NOTE — Progress Notes (Signed)
Physical Therapy Treatment Patient Details Name: Jenny [name] MRN: [id number] DOB: 05-07-1927 Today's Date: 05/12/2012 Time: 1345-1410 PT Time Calculation (min): 25 min  PT Assessment / Plan / Recommendation Comments on Treatment Session  Pt made good progress with mobility today. Able to transfer to standing and take a few steps with RW to recliner with +2 assist. Pt continues to yell with pain with movement but is tolerating increased activity.    Follow Up Recommendations  Skilled nursing facility    Barriers to Discharge        Equipment Recommendations  Defer to next venue    Recommendations for Other Services    Frequency 7X/week   Plan Discharge plan remains appropriate;Frequency remains appropriate    Precautions / Restrictions Precautions Precautions: None Precaution Comments: direct anterior hip Restrictions Weight Bearing Restrictions: No RLE Weight Bearing: Weight bearing as tolerated   Pertinent Vitals/Pain *10/10 with movement of RLE Premedicated, ice applied, repositioned**    Mobility  Bed Mobility Bed Mobility: Supine to Sit;Sit to Supine Supine to Sit: 1: +2 Total assist Supine to Sit: Patient Percentage: 30% Sit to Supine: 1: +2 Total assist Sit to Supine: Patient Percentage: 30% Details for Bed Mobility Assistance: assist to control descent and to elevate BLEs Transfers Transfers: Sit to Stand;Stand to Sit Sit to Stand: 1: +2 Total assist;From chair/3-in-1 Sit to Stand: Patient Percentage: 50% Stand to Sit: 1: +2 Total assist;To bed Stand to Sit: Patient Percentage: 70% Details for Transfer Assistance: improved activity tolerance today, VCs for hand placement, VCs for pursed lip breathing Ambulation/Gait Ambulation/Gait Assistance: 1: +2 Total assist Ambulation/Gait: Patient Percentage: 70% Ambulation Distance (Feet): 3 Feet Assistive device: Rolling walker Ambulation/Gait Assistance Details: 3' sidestepping recliner to bed Gait Pattern:  Step-to pattern;Decreased step length - left;Decreased step length - right    Exercises Total Joint Exercises Ankle Circles/Pumps: AROM;Strengthening;Both;10 reps;Supine Heel Slides: AAROM;Right;Supine;15 reps Hip ABduction/ADduction: AAROM;Right;Supine;15 reps   PT Diagnosis:    PT Problem List:   PT Treatment Interventions:     PT Goals Acute Rehab PT Goals PT Goal Formulation: With patient/family Time For Goal Achievement: 05/17/12 Potential to Achieve Goals: Good Pt will go Supine/Side to Sit: with min assist PT Goal: Supine/Side to Sit - Progress: Progressing toward goal Pt will go Sit to Stand: with min assist PT Goal: Sit to Stand - Progress: Progressing toward goal Pt will Ambulate: 51 - 150 feet;with min assist PT Goal: Ambulate - Progress: Progressing toward goal Pt will Perform Home Exercise Program: with min assist PT Goal: Perform Home Exercise Program - Progress: Progressing toward goal  Visit Information  Last PT Received On: 05/12/12 Assistance Needed: +2    Subjective Data  Subjective: I don't think the pain medicine helps. Patient Stated Goal: to walk   Cognition  Overall Cognitive Status: Appears within functional limits for tasks assessed/performed Arousal/Alertness: Awake/alert (did doze off a couple of times, easily rearoused) Orientation Level: Appears intact for tasks assessed Behavior During Session: [facility] for tasks performed    Balance     End of Session PT - End of Session Equipment Utilized During Treatment: Gait belt Activity Tolerance: Patient limited by pain;Patient limited by fatigue Patient left: with call bell/phone within reach;with family/visitor present;in bed Nurse Communication: Mobility status   GP     Ralene Bathe Kistler 05/12/2012, 2:15 PM [phone]

## 2012-05-12 NOTE — Progress Notes (Signed)
Patient ID: Jenny [name], female   DOB: 1927/09/21, 78 y.o.   MRN: [id number] Hgb improved with transfusion.  Foley back in due to retention.  Not ambulating well due to pain.  X-rays of right hip post-op show no complicating features.  Will likely need SNF placement.

## 2012-05-12 NOTE — Clinical Documentation Improvement (Signed)
Anemia Blood Loss Clarification  THIS DOCUMENT IS NOT A PERMANENT PART OF THE MEDICAL RECORD  RESPOND TO THE THIS QUERY, FOLLOW THE INSTRUCTIONS BELOW:  1. If needed, update documentation for the patient's encounter via the notes activity.  2. Access this query again and click edit on the In [employer].  3. After updating, or not, click F2 to complete all highlighted (required) fields concerning your review. Select "additional documentation in the medical record" OR "no additional documentation provided".  4. Click Sign note button.  5. The deficiency will fall out of your In Basket *Please let us know if you are not able to complete this workflow by phone or e-mail (listed below).        05/12/12  Dear Dr. Magnus Ivan, Jenny [name]  In an effort to better capture your patient's severity of illness, reflect appropriate length of stay and utilization of resources, a review of the patient medical record has revealed the following indicators.    Based on your clinical judgment, please clarify and document in a progress note and/or discharge summary the clinical condition associated with the following supporting information:  In responding to this query please exercise your independent judgment.  The fact that a query is asked, does not imply that any particular answer is desired or expected.  Pt admitted with end-stage DJD.  According to lab pt's post op H/H=7.4/21.9   Please clarify based on abnormal post-op  H/H can be further specified as one of the diagnoses listed below and document in pn or d/c summary.   Possible Clinical Conditions?   " Expected Acute Blood Loss Anemia  " Acute Blood Loss Anemia  " Acute on chronic blood loss anemia   " Other Condition________________  " Cannot Clinically Determine  Risk Factors: (recent surgery, pre op anemia, EBL in OR)  Supporting Information:  Signs and Symptoms  PN 05/12/12 Hgb improved with transfusion.   05/10/12 Hgb-  7.8--- will watch   Diagnostics: Component     Latest Ref Rng 05/10/2012 05/11/2012 05/12/2012            Hemoglobin     12.0 - 15.0 g/dL 7.8 (L) 7.4 (L) 9.5 (L)  HCT     36.0 - 46.0 % 23.1 (L) 21.9 (L) 27.4 (L)   Treatments: Transfusion: Component     Latest Ref Rng 05/09/2012 05/09/2012        10:20 AM 10:20 AM  Blood Component Type      RED CELLS,LR RED CELLS,LR  Unit division      00 00  Status of Unit      ISSUED,FINAL ISSUED,FINAL  Transfusion Status      OK TO TRANSFUSE OK TO TRANSFUSE  Crossmatch Result      Compatible Compatible   IV fluids /  Serial H&H monitoring   Reviewed: additional documentation in the medical record  Thank You,  Enis Slipper RN, BSN, CCDS Clinical Documentation Specialist [facility] Dept Pager: [phone] / E-mail: [email or web address] Health Information Management 

## 2012-05-12 NOTE — Progress Notes (Signed)
Patient ID: Jenny [name], female   DOB: 11/20/1926, 78 y.o.   MRN: [id number] Transfusion yesterday was due to symptomatic acute blood loss anemia as it related to her surgery/total hip replacement.  The FL-2 is signed.  Will d/c to SNF tomorrow.

## 2012-05-12 NOTE — Progress Notes (Signed)
FL2 in shadow chart for MD signature. Pt requires Level 2 [place] which will be completed today. [place] # should be available by Wed. CSW will continue to assist with d/c planning to SNF.  Cori Razor LCSW [phone]

## 2012-05-12 NOTE — Progress Notes (Signed)
Physical Therapy Treatment Patient Details Name: Jenny [name] MRN: [id number] DOB: 1927-02-09 Today's Date: 05/12/2012 Time: 7829-5621 PT Time Calculation (min): 26 min  PT Assessment / Plan / Recommendation Comments on Treatment Session  Pt made good progress with mobility today. Able to transfer to standing and take a few steps with RW to recliner with +2 assist.     Follow Up Recommendations  Skilled nursing facility    Barriers to Discharge        Equipment Recommendations  Defer to next venue    Recommendations for Other Services    Frequency 7X/week   Plan Discharge plan remains appropriate;Frequency remains appropriate    Precautions / Restrictions Precautions Precautions: None Precaution Comments: direct anterior hip Restrictions Weight Bearing Restrictions: No RLE Weight Bearing: Weight bearing as tolerated   Pertinent Vitals/Pain *R hip pain with activity Pt premedicated, ice applied**    Mobility  Bed Mobility Bed Mobility: Supine to Sit Supine to Sit: 1: +2 Total assist Supine to Sit: Patient Percentage: 30% Details for Bed Mobility Assistance: assist to elevate trunk and advance BLEs Transfers Transfers: Sit to Stand;Stand to Sit Sit to Stand: From bed;1: +2 Total assist Sit to Stand: Patient Percentage: 40% Stand to Sit: 1: +2 Total assist Stand to Sit: Patient Percentage: 60% Details for Transfer Assistance: improved activity tolerance today Ambulation/Gait Ambulation/Gait Assistance: 1: +2 Total assist Ambulation/Gait: Patient Percentage: 70% Ambulation Distance (Feet): 2 Feet Assistive device: Rolling walker Ambulation/Gait Assistance Details: Bed to chair with RW, +2 total assist for safety, VCs for sequencing Gait Pattern: Step-to pattern;Decreased step length - left;Decreased step length - right    Exercises Total Joint Exercises Ankle Circles/Pumps: AROM;Strengthening;Both;10 reps;Supine Heel Slides: AAROM;Right;Supine;15 reps Hip  ABduction/ADduction: AAROM;Right;Supine;15 reps   PT Diagnosis:    PT Problem List:   PT Treatment Interventions:     PT Goals Acute Rehab PT Goals PT Goal Formulation: With patient/family Time For Goal Achievement: 05/17/12 Potential to Achieve Goals: Good Pt will go Supine/Side to Sit: with min assist PT Goal: Supine/Side to Sit - Progress: Progressing toward goal Pt will go Sit to Stand: with min assist PT Goal: Sit to Stand - Progress: Progressing toward goal Pt will Ambulate: 51 - 150 feet;with min assist PT Goal: Ambulate - Progress: Progressing toward goal Pt will Perform Home Exercise Program: with min assist PT Goal: Perform Home Exercise Program - Progress: Progressing toward goal  Visit Information  Last PT Received On: 05/12/12 Assistance Needed: +2    Subjective Data  Subjective: I know I need to get up.  Patient Stated Goal: to walk   Cognition  Overall Cognitive Status: Appears within functional limits for tasks assessed/performed Arousal/Alertness: Awake/alert (did doze off a couple of times, easily rearoused) Orientation Level: Appears intact for tasks assessed Behavior During Session: [facility] for tasks performed    Balance     End of Session PT - End of Session Equipment Utilized During Treatment: Gait belt Activity Tolerance: Patient limited by pain;Patient limited by fatigue Patient left: in chair;with call bell/phone within reach;with family/visitor present Nurse Communication: Mobility status   GP     Jenny [name] 05/12/2012, 11:45 AM [phone]

## 2012-05-13 MED ORDER — OXYCODONE-ACETAMINOPHEN 5-325 MG PO TABS: 1.0000 | ORAL_TABLET | ORAL | Status: AC | PRN

## 2012-05-13 MED ORDER — METHOCARBAMOL 500 MG PO TABS: 500.0000 mg | ORAL_TABLET | Freq: Four times a day (QID) | ORAL | Status: AC | PRN

## 2012-05-13 MED ORDER — ASPIRIN 325 MG PO TBEC: 325.0000 mg | DELAYED_RELEASE_TABLET | Freq: Two times a day (BID) | ORAL | Status: AC

## 2012-05-13 NOTE — Progress Notes (Signed)
Patient ID: Jenny [name], female   DOB: 1927/02/12, 78 y.o.   MRN: [id number] Looks good this am.  No acute changes.  Right hip stable.  Can discharge to SNF today.

## 2012-05-13 NOTE — Progress Notes (Signed)
Physical Therapy Treatment Patient Details Name: Jenny [name] MRN: [id number] DOB: Apr 10, 1927 Today's Date: 05/13/2012 Time: 4098-1191 PT Time Calculation (min): 24 min  PT Assessment / Plan / Recommendation Comments on Treatment Session  pt progressing well today, D/C delayed due to insurance    Follow Up Recommendations  Skilled nursing facility    Barriers to Discharge        Equipment Recommendations  Defer to next venue    Recommendations for Other Services    Frequency 7X/week   Plan Discharge plan remains appropriate;Frequency remains appropriate    Precautions / Restrictions Precautions Precautions: None Precaution Comments: direct anterior hip Restrictions Weight Bearing Restrictions: No RLE Weight Bearing: Weight bearing as tolerated   Pertinent Vitals/Pain Pt Sats 93% on RA O2 replace at 2L    Mobility  Bed Mobility Bed Mobility: Supine to Sit Supine to Sit: 4: Min assist;HOB elevated Details for Bed Mobility Assistance: assist with LEs; increased time Transfers Transfers: Sit to Stand;Stand to Sit;Stand Pivot Transfers Sit to Stand: 3: Mod assist;From bed;With upper extremity assist Stand to Sit: 3: Mod assist;To chair/3-in-1;With upper extremity assist Stand Pivot Transfers: 3: Mod assist Details for Transfer Assistance: improved activity tolerance today, VCs for hand placement, VCs for pursed lip breathing Ambulation/Gait Ambulation/Gait Assistance: 4: Min assist Ambulation Distance (Feet): 25 Feet Assistive device: Rolling walker Ambulation/Gait Assistance Details: cues for RW distance from self, posture, step length and use of UEs Gait Pattern: Step-to pattern    Exercises     PT Diagnosis:    PT Problem List:   PT Treatment Interventions:     PT Goals Acute Rehab PT Goals Time For Goal Achievement: 05/17/12 Potential to Achieve Goals: Good Pt will go Supine/Side to Sit: with min assist PT Goal: Supine/Side to Sit - Progress: Met Pt will  go Sit to Stand: with min assist PT Goal: Sit to Stand - Progress: Progressing toward goal Pt will Ambulate: 51 - 150 feet;with min assist;with rolling walker PT Goal: Ambulate - Progress: Progressing toward goal  Visit Information  Last PT Received On: 05/13/12 Assistance Needed: +2    Subjective Data      Cognition  Overall Cognitive Status: Appears within functional limits for tasks assessed/performed Arousal/Alertness: Awake/alert Orientation Level: Appears intact for tasks assessed Behavior During Session: [facility] for tasks performed Cognition - Other Comments: requires repetition of instructions, demonstrates no carry over with safety/mobility    Balance     End of Session PT - End of Session Activity Tolerance: Patient tolerated treatment well Patient left: in chair;with call bell/phone within reach;with family/visitor present Nurse Communication: Mobility status   GP     [facility] 05/13/2012, 10:50 AM

## 2012-05-13 NOTE — Progress Notes (Addendum)
CSW assisting with d/c planning to SNF. Awaiting return call from [facility] to confirm pending admission. Expecting PASRR # today/tomorrow. Will continue to follw to assist with d/c planning to SNF.  Asher Muir Salvatore Poe LCSW [phone]  1650 :  PASRR # received . [facility] contacted and pt will have SNF bed available at [facility] Wed. No availability at [facility] at this time. CSW will assist with d/c planning to [facility] in the am.  Cori Razor LCSW [phone]

## 2012-05-13 NOTE — Progress Notes (Signed)
05/13/12 1600  PT Visit Information  Last PT Received On 05/13/12  PT Time Calculation  PT Start Time 1600  PT Stop Time 1618  PT Time Calculation (min) 18 min  Precautions  Precautions None  Precaution Comments direct anterior hip  Restrictions  RLE Weight Bearing WBAT  Other Position/Activity Restrictions WBAT  Cognition  Overall Cognitive Status Appears within functional limits for tasks assessed/performed  Arousal/Alertness Awake/alert  Orientation Level Appears intact for tasks assessed  Behavior During Session [facility] for tasks performed  Cognition - Other Comments requires repetition of instructions, demonstrates no carry over with safety/mobility  Bed Mobility  Sit to Supine 3: Mod assist  Details for Bed Mobility Assistance assist with LEs; increased time  Transfers  Transfers Sit to Stand;Stand to Sit  Sit to Stand 3: Mod assist;4: Min assist;From chair/3-in-1;With upper extremity assist  Stand to Sit 3: Mod assist;4: Min assist;To bed  Details for Transfer Assistance cues for hand placement and safety  Ambulation/Gait  Ambulation/Gait Assistance 4: Min assist  Ambulation Distance (Feet) 20 Feet  Assistive device Rolling walker  Ambulation/Gait Assistance Details cues for RW distance from self, posture, step length and use of UEs  Gait Pattern Step-to pattern  PT - End of Session  Equipment Utilized During Treatment Gait belt  Activity Tolerance Patient tolerated treatment well  Patient left in bed;with call bell/phone within reach;with family/visitor present  Nurse Communication Patient requests pain meds (c/o pain right hip 6/10)  PT - Assessment/Plan  Comments on Treatment Session progressing well; too fatigued for ther ex  PT Plan Discharge plan remains appropriate;Frequency remains appropriate  PT Frequency 7X/week  Follow Up Recommendations Skilled nursing facility  Equipment Recommended Defer to next venue  Acute Rehab PT Goals  Time For Goal Achievement  05/17/12  Potential to Achieve Goals Good  Pt will go Supine/Side to Sit with min assist  PT Goal: Supine/Side to Sit - Progress Progressing toward goal  Pt will go Sit to Stand with min assist  PT Goal: Sit to Stand - Progress Progressing toward goal  Pt will Ambulate 51 - 150 feet;with min assist;with rolling walker  PT Goal: Ambulate - Progress Progressing toward goal  PT Treatments  $Gait Training 8-22 mins

## 2012-05-13 NOTE — Progress Notes (Signed)
Patient due to void at midnight.  Reported no sensation to void and no discomfort or distention.  Encouraged patient to drink fluids, coke given.  Rechecked patient and asked her to attempt to void, unable to. Bladder scan performed showed 110cc.  Paged Dr. Lajoyce Corners, advised to continue to encourage fluid and recheck later.  Will continue to monitor.

## 2012-05-13 NOTE — Discharge Summary (Signed)
Patient ID: Jenny [name] MRN: [id number] DOB/AGE: [birth year] 76 y.o.  Admit date: 05/09/2012 Discharge date: 05/13/2012  Admission Diagnoses:  Principal Problem:  *Degenerative arthritis of hip   Discharge Diagnoses:  Same  Past Medical History  Diagnosis Date  . Hypertension   . Coronary artery disease   . Heart murmur   . Hypothyroidism 05-02-12    tx. Synthroid  . Depression 05-02-12     depression only,tx. oral meds  . Headache 05-02-12    past hx. migrianes-many yrs ago  . Arthritis 05-02-12    osteoarthritis-rt. hip  . Pneumonia 05-02-12    mild case    Surgeries: Procedure(s): TOTAL HIP ARTHROPLASTY ANTERIOR APPROACH on 05/09/2012   Consultants:    Discharged Condition: Improved  Hospital Course: Jenny [name] is an 78 y.o. female who was admitted 05/09/2012 for operative treatment ofDegenerative arthritis of hip. Patient has severe unremitting pain that affects sleep, daily activities, and work/hobbies. After pre-op clearance the patient was taken to the operating room on 05/09/2012 and underwent  Procedure(s): TOTAL HIP ARTHROPLASTY ANTERIOR APPROACH.    Patient was given perioperative antibiotics: Anti-infectives     Start     Dose/Rate Route Frequency Ordered Stop   05/09/12 2000   clindamycin (CLEOCIN) IVPB 600 mg        600 mg 100 mL/hr over 30 Minutes Intravenous Every 6 hours 05/09/12 1628 05/10/12 0231   05/09/12 0953   clindamycin (CLEOCIN) IVPB 600 mg        600 mg 100 mL/hr over 30 Minutes Intravenous 60 min pre-op 05/09/12 0953 05/09/12 1250           Patient was given sequential compression devices, early ambulation, and chemoprophylaxis to prevent DVT.  Patient benefited maximally from hospital stay and there were no complications.  She did require a transfusion due to symptomatic acute blood loss anemia and responded well.  Recent vital signs: Patient Vitals for the past 24 hrs:  BP Temp Temp src Pulse Resp SpO2  05/13/12 0502 140/66 mmHg  98 F (36.7 C) Oral 74  16  92 %  05/12/12 2200 120/69 mmHg 98.1 F (36.7 C) Oral 66  15  95 %  05/12/12 1430 128/72 mmHg 98.2 F (36.8 C) Oral 65  16  93 %     Recent laboratory studies:  Basename 05/12/12 0353 24-May-2012 0430  WBC 12.6* 11.9*  HGB 9.5* 7.4*  HCT 27.4* 21.9*  PLT 288 311  NA -- --  K -- --  CL -- --  CO2 -- --  BUN -- --  CREATININE -- --  GLUCOSE -- --  INR -- --  CALCIUM -- --     Discharge Medications:   Medication List  As of 05/13/2012  6:57 AM   STOP taking these medications         aspirin 81 MG tablet         TAKE these medications         acetaminophen 325 MG tablet   Commonly known as: TYLENOL   Take 650 mg by mouth every 6 (six) hours as needed.      ALPRAZolam 0.25 MG tablet   Commonly known as: XANAX   Take 0.25 mg by mouth 3 (three) times daily as needed. For anxiety      ARIPiprazole 2 MG tablet   Commonly known as: ABILIFY   Take 1 mg by mouth at bedtime. To help depression and nerves      aspirin  325 MG EC tablet   Take 1 tablet (325 mg total) by mouth 2 (two) times daily.      CALCIUM 1200 PO   Take 1 tablet by mouth daily.      COMBIGAN 0.2-0.5 % ophthalmic solution   Generic drug: brimonidine-timolol   Place 1 drop into both eyes every 12 (twelve) hours.      cycloSPORINE 0.05 % ophthalmic emulsion   Commonly known as: RESTASIS   Place 1 drop into both eyes 2 (two) times daily. In both eyes      furosemide 40 MG tablet   Commonly known as: LASIX   Take 80 mg by mouth daily with breakfast. To help with swelling      lamoTRIgine 150 MG tablet   Commonly known as: LAMICTAL   Take 150 mg by mouth daily with breakfast.      levothyroxine 88 MCG tablet   Commonly known as: SYNTHROID, LEVOTHROID   Take 88 mcg by mouth daily with breakfast. For thyroid      methocarbamol 500 MG tablet   Commonly known as: ROBAXIN   Take 1 tablet (500 mg total) by mouth every 6 (six) hours as needed.      nebivolol 10 MG tablet    Commonly known as: BYSTOLIC   Take 10 mg by mouth daily with breakfast. To control blood pressure      oxyCODONE-acetaminophen 5-325 MG per tablet   Commonly known as: PERCOCET   Take 1-2 tablets by mouth every 4 (four) hours as needed for pain.      sertraline 50 MG tablet   Commonly known as: ZOLOFT   Take 100 mg by mouth daily with breakfast. For depression,nerves and anxiety      vitamin C 1000 MG tablet   Take 1,000 mg by mouth daily with breakfast.      Vitamin D 2000 UNITS tablet   Take 2,000 Units by mouth daily. For vitamin D supplementation      vitamin E 200 UNIT capsule   Take 200 Units by mouth daily with breakfast.            Diagnostic Studies: Dg Hip Complete Right  05/11/2012  *RADIOLOGY REPORT*  Clinical Data: Pain.  Status post arthroplasty.  RIGHT HIP - COMPLETE 2+ VIEW  Comparison: None.  Findings: Again noted are hardware components of a right hip arthroplasty.  There are skin staples overlying the right hip.  No evidence for periprosthetic fracture or dislocation.  There has been increase in soft tissue swelling and skin thickening overlying the right hip and there is a focal area of lucency just above the greater trochanter which likely represents gas within the soft tissues.  IMPRESSION:  1.  Nonspecific postoperative findings compatible with recent right hip arthroplasty. 2.  No evidence for periprosthetic fracture or subluxation.  Original Report Authenticated By: Rosealee Albee, M.D.   Dg Hip Complete Right  05/09/2012  *RADIOLOGY REPORT*  Clinical Data: Hip replacement  RIGHT HIP - COMPLETE 2+ VIEW  Comparison: 02/20/2012  Findings: Right total hip arthroplasty has been performed.  There is anatomic alignment of the osseous and prosthetic structures.  No breakage or loosening of the hardware.  IMPRESSION: Right total arthroplasty anatomically aligned.  Original Report Authenticated By: Donavan Burnet, M.D.   Dg Pelvis Portable  05/09/2012  *RADIOLOGY  REPORT*  Clinical Data: Postop  PORTABLE PELVIS  Comparison:  02/20/2012  Findings: Single portable frontal view of the pelvis submitted. There is a  right hip prosthesis in anatomic alignment. Postsurgical changes are noted with right lateral skin staples. Small amount of periarticular air right hip joint.  IMPRESSION: Right hip prosthesis in anatomic alignment.  Original Report Authenticated By: Natasha Mead, M.D.   Dg Hip Portable 1 View Right  05/09/2012  *RADIOLOGY REPORT*  Clinical Data: Postop  PORTABLE RIGHT HIP - 1 VIEW  Comparison: Right hip same day  Findings: Single portable cross-table view of the right hip submitted.  There is a right hip prosthesis in anatomic alignment. Postsurgical changes are noted with small amount of periarticular soft tissue air.  Lateral skin staples are noted.  IMPRESSION: Right hip prosthesis in anatomic alignment .  Original Report Authenticated By: Natasha Mead, M.D.   Dg C-arm 61-120 Min-no Report  05/09/2012  CLINICAL DATA: anterior hip   C-ARM 61-120 MINUTES  Fluoroscopy was utilized by the requesting physician.  No radiographic  interpretation.      Disposition: to short-term skilled nursing facility.  Discharge Orders    Future Appointments: Provider: Department: Dept Phone: Center:   06/13/2012 1:00 PM Sherrie George, MD [facility] [phone] None     Future Orders Please Complete By Expires   Diet - low sodium heart healthy      Call MD / Call 911      Comments:   If you experience chest pain or shortness of breath, CALL 911 and be transported to the hospital emergency room.  If you develope a fever above 101 F, pus (white drainage) or increased drainage or redness at the wound, or calf pain, call your surgeon's office.   Constipation Prevention      Comments:   Drink plenty of fluids.  Prune juice may be helpful.  You may use a stool softener, such as Colace (over the counter) 100 mg twice a day.  Use MiraLax (over the counter) for  constipation as needed.   Increase activity slowly as tolerated      Discharge instructions      Comments:   Increase activities as comfort allows. Full weight as tolerated right hip with no hip precautions (working with PT/OT) Can get actual incision wet starting 05/14/12. New dry dressing daily right hip incision. Follow-up in 2 weeks at [facility] [phone])   Discharge patient            Signed: Kathryne Hitch 05/13/2012, 6:57 AM

## 2012-05-14 NOTE — Progress Notes (Signed)
Pt awaiting ambulance transport to SNF.  Gave pain med and robaxin at 1312.  Pt voiding without difficulty.  Pt alert and oriented x4.  Pt stable and mobility is improving, pt ready for d/c.  Family is at bedside and is aware of d/c and that the ambulance has been called.

## 2012-05-14 NOTE — Discharge Summary (Signed)
Patient ID: Jenny [name] MRN: [id number] DOB/AGE: 07-24-1927 78 y.o.  Admit date: 05/09/2012 Discharge date: 05/14/2012  Admission Diagnoses:  Principal Problem:  *Degenerative arthritis of hip   Discharge Diagnoses:  Same  Past Medical History  Diagnosis Date  . Hypertension   . Coronary artery disease   . Heart murmur   . Hypothyroidism 05-02-12    tx. Synthroid  . Depression 05-02-12     depression only,tx. oral meds  . Headache 05-02-12    past hx. migrianes-many yrs ago  . Arthritis 05-02-12    osteoarthritis-rt. hip  . Pneumonia 05-02-12    mild case    Surgeries: Procedure(s): TOTAL HIP ARTHROPLASTY ANTERIOR APPROACH on 05/09/2012   Consultants:    Discharged Condition: Improved  Hospital Course: Jenny [name] is an 78 y.o. female who was admitted 05/09/2012 for operative treatment ofDegenerative arthritis of hip. Patient has severe unremitting pain that affects sleep, daily activities, and work/hobbies. After pre-op clearance the patient was taken to the operating room on 05/09/2012 and underwent  Procedure(s): TOTAL HIP ARTHROPLASTY ANTERIOR APPROACH.    Patient was given perioperative antibiotics: Anti-infectives     Start     Dose/Rate Route Frequency Ordered Stop   05/09/12 2000   clindamycin (CLEOCIN) IVPB 600 mg        600 mg 100 mL/hr over 30 Minutes Intravenous Every 6 hours 05/09/12 1628 05/10/12 0231   05/09/12 0953   clindamycin (CLEOCIN) IVPB 600 mg        600 mg 100 mL/hr over 30 Minutes Intravenous 60 min pre-op 05/09/12 0953 05/09/12 1250           Patient was given sequential compression devices, early ambulation, and chemoprophylaxis to prevent DVT.  Patient benefited maximally from hospital stay and there were no complications.    Recent vital signs: Patient Vitals for the past 24 hrs:  BP Temp Temp src Pulse Resp SpO2  05/14/12 0510 128/57 mmHg 98.1 F (36.7 C) Oral 60  14  98 %  05/13/12 2054 115/73 mmHg 98.1 F (36.7 C) - 55  14   98 %  05/13/12 1600 - - - - 16  94 %  05/13/12 1551 140/62 mmHg 98.2 F (36.8 C) - 76  16  94 %  05/13/12 1200 - - - - 16  94 %  05/13/12 1100 138/78 mmHg 98.2 F (36.8 C) Oral 65  12  95 %     Recent laboratory studies:  Basename 05/12/12 0353  WBC 12.6*  HGB 9.5*  HCT 27.4*  PLT 288  NA --  K --  CL --  CO2 --  BUN --  CREATININE --  GLUCOSE --  INR --  CALCIUM --     Discharge Medications:   Medication List  As of 05/14/2012  6:49 AM   STOP taking these medications         aspirin 81 MG tablet         TAKE these medications         acetaminophen 325 MG tablet   Commonly known as: TYLENOL   Take 650 mg by mouth every 6 (six) hours as needed.      ALPRAZolam 0.25 MG tablet   Commonly known as: XANAX   Take 0.25 mg by mouth 3 (three) times daily as needed. For anxiety      ARIPiprazole 2 MG tablet   Commonly known as: ABILIFY   Take 1 mg by mouth at bedtime. To help  depression and nerves      aspirin 325 MG EC tablet   Take 1 tablet (325 mg total) by mouth 2 (two) times daily.      CALCIUM 1200 PO   Take 1 tablet by mouth daily.      COMBIGAN 0.2-0.5 % ophthalmic solution   Generic drug: brimonidine-timolol   Place 1 drop into both eyes every 12 (twelve) hours.      cycloSPORINE 0.05 % ophthalmic emulsion   Commonly known as: RESTASIS   Place 1 drop into both eyes 2 (two) times daily. In both eyes      furosemide 40 MG tablet   Commonly known as: LASIX   Take 80 mg by mouth daily with breakfast. To help with swelling      lamoTRIgine 150 MG tablet   Commonly known as: LAMICTAL   Take 150 mg by mouth daily with breakfast.      levothyroxine 88 MCG tablet   Commonly known as: SYNTHROID, LEVOTHROID   Take 88 mcg by mouth daily with breakfast. For thyroid      methocarbamol 500 MG tablet   Commonly known as: ROBAXIN   Take 1 tablet (500 mg total) by mouth every 6 (six) hours as needed.      nebivolol 10 MG tablet   Commonly known as: BYSTOLIC     Take 10 mg by mouth daily with breakfast. To control blood pressure      oxyCODONE-acetaminophen 5-325 MG per tablet   Commonly known as: PERCOCET   Take 1-2 tablets by mouth every 4 (four) hours as needed for pain.      sertraline 50 MG tablet   Commonly known as: ZOLOFT   Take 100 mg by mouth daily with breakfast. For depression,nerves and anxiety      vitamin C 1000 MG tablet   Take 1,000 mg by mouth daily with breakfast.      Vitamin D 2000 UNITS tablet   Take 2,000 Units by mouth daily. For vitamin D supplementation      vitamin E 200 UNIT capsule   Take 200 Units by mouth daily with breakfast.            Diagnostic Studies: Dg Hip Complete Right  05/11/2012  *RADIOLOGY REPORT*  Clinical Data: Pain.  Status post arthroplasty.  RIGHT HIP - COMPLETE 2+ VIEW  Comparison: None.  Findings: Again noted are hardware components of a right hip arthroplasty.  There are skin staples overlying the right hip.  No evidence for periprosthetic fracture or dislocation.  There has been increase in soft tissue swelling and skin thickening overlying the right hip and there is a focal area of lucency just above the greater trochanter which likely represents gas within the soft tissues.  IMPRESSION:  1.  Nonspecific postoperative findings compatible with recent right hip arthroplasty. 2.  No evidence for periprosthetic fracture or subluxation.  Original Report Authenticated By: Rosealee Albee, M.D.   Dg Hip Complete Right  05/09/2012  *RADIOLOGY REPORT*  Clinical Data: Hip replacement  RIGHT HIP - COMPLETE 2+ VIEW  Comparison: 02/20/2012  Findings: Right total hip arthroplasty has been performed.  There is anatomic alignment of the osseous and prosthetic structures.  No breakage or loosening of the hardware.  IMPRESSION: Right total arthroplasty anatomically aligned.  Original Report Authenticated By: Donavan Burnet, M.D.   Dg Pelvis Portable  05/09/2012  *RADIOLOGY REPORT*  Clinical Data: Postop   PORTABLE PELVIS  Comparison:  02/20/2012  Findings: Single  portable frontal view of the pelvis submitted. There is a right hip prosthesis in anatomic alignment. Postsurgical changes are noted with right lateral skin staples. Small amount of periarticular air right hip joint.  IMPRESSION: Right hip prosthesis in anatomic alignment.  Original Report Authenticated By: Natasha Mead, M.D.   Dg Hip Portable 1 View Right  05/09/2012  *RADIOLOGY REPORT*  Clinical Data: Postop  PORTABLE RIGHT HIP - 1 VIEW  Comparison: Right hip same day  Findings: Single portable cross-table view of the right hip submitted.  There is a right hip prosthesis in anatomic alignment. Postsurgical changes are noted with small amount of periarticular soft tissue air.  Lateral skin staples are noted.  IMPRESSION: Right hip prosthesis in anatomic alignment .  Original Report Authenticated By: Natasha Mead, M.D.   Dg C-arm 61-120 Min-no Report  05/09/2012  CLINICAL DATA: anterior hip   C-ARM 61-120 MINUTES  Fluoroscopy was utilized by the requesting physician.  No radiographic  interpretation.      Disposition: to skilled nursing facility  Discharge Orders    Future Appointments: Provider: Department: Dept Phone: Center:   06/13/2012 1:00 PM Sherrie George, MD [facility] [phone] None     Future Orders Please Complete By Expires   Diet - low sodium heart healthy      Call MD / Call 911      Comments:   If you experience chest pain or shortness of breath, CALL 911 and be transported to the hospital emergency room.  If you develope a fever above 101 F, pus (white drainage) or increased drainage or redness at the wound, or calf pain, call your surgeon's office.   Constipation Prevention      Comments:   Drink plenty of fluids.  Prune juice may be helpful.  You may use a stool softener, such as Colace (over the counter) 100 mg twice a day.  Use MiraLax (over the counter) for constipation as needed.   Increase activity slowly  as tolerated      Discharge instructions      Comments:   Increase activities as comfort allows. Full weight as tolerated right hip with no hip precautions (working with PT/OT) Can get actual incision wet starting 05/14/12. New dry dressing daily right hip incision. Follow-up in 2 weeks at [facility] [phone])   Discharge patient      Discharge patient            Signed: Kathryne Hitch 05/14/2012, 6:49 AM

## 2012-05-14 NOTE — Progress Notes (Signed)
Patient ID: Jenny [name], female   DOB: 07-28-1927, 78 y.o.   MRN: [id number] No acute changes.  Only stayed due to paperwork issues and not due to a medical necessity.  Can d/c to SNF today.

## 2012-05-15 NOTE — Progress Notes (Signed)
Pt d/c to [facility] on 05/14/12 via [facility] .  Cori Razor LCSW [phone]

## 2012-05-19 ENCOUNTER — Encounter (HOSPITAL_COMMUNITY): Payer: Self-pay | Admitting: Orthopaedic Surgery

## 2012-06-05 ENCOUNTER — Encounter (INDEPENDENT_AMBULATORY_CARE_PROVIDER_SITE_OTHER): Payer: Medicare Other | Admitting: Ophthalmology

## 2012-06-13 ENCOUNTER — Encounter (INDEPENDENT_AMBULATORY_CARE_PROVIDER_SITE_OTHER): Payer: Medicare Other | Admitting: Ophthalmology

## 2012-07-18 ENCOUNTER — Encounter (INDEPENDENT_AMBULATORY_CARE_PROVIDER_SITE_OTHER): Payer: Medicare Other | Admitting: Ophthalmology

## 2012-07-18 DIAGNOSIS — H43819 Vitreous degeneration, unspecified eye: Secondary | ICD-10-CM

## 2012-07-18 DIAGNOSIS — H35039 Hypertensive retinopathy, unspecified eye: Secondary | ICD-10-CM

## 2012-07-18 DIAGNOSIS — I10 Essential (primary) hypertension: Secondary | ICD-10-CM

## 2012-07-18 DIAGNOSIS — H3530 Unspecified macular degeneration: Secondary | ICD-10-CM

## 2012-09-01 ENCOUNTER — Encounter (INDEPENDENT_AMBULATORY_CARE_PROVIDER_SITE_OTHER): Payer: Medicare Other | Admitting: Ophthalmology

## 2012-09-01 DIAGNOSIS — I10 Essential (primary) hypertension: Secondary | ICD-10-CM

## 2012-09-01 DIAGNOSIS — H35039 Hypertensive retinopathy, unspecified eye: Secondary | ICD-10-CM

## 2012-09-01 DIAGNOSIS — H3530 Unspecified macular degeneration: Secondary | ICD-10-CM

## 2012-09-01 DIAGNOSIS — H43819 Vitreous degeneration, unspecified eye: Secondary | ICD-10-CM

## 2012-09-01 DIAGNOSIS — H35359 Cystoid macular degeneration, unspecified eye: Secondary | ICD-10-CM

## 2012-09-19 ENCOUNTER — Encounter: Payer: Self-pay | Admitting: Physical Medicine and Rehabilitation

## 2012-09-27 ENCOUNTER — Ambulatory Visit (INDEPENDENT_AMBULATORY_CARE_PROVIDER_SITE_OTHER): Payer: Medicare Other | Admitting: Emergency Medicine

## 2012-09-27 VITALS — BP 158/89 | HR 67 | Temp 97.8°F | Resp 18 | Ht 61.75 in | Wt 156.0 lb

## 2012-09-27 DIAGNOSIS — L03119 Cellulitis of unspecified part of limb: Secondary | ICD-10-CM

## 2012-09-27 DIAGNOSIS — L02419 Cutaneous abscess of limb, unspecified: Secondary | ICD-10-CM

## 2012-09-27 MED ORDER — CLINDAMYCIN HCL 150 MG PO CAPS: 150.0000 mg | ORAL_CAPSULE | Freq: Four times a day (QID) | ORAL | Status: DC

## 2012-09-27 MED ORDER — SULFAMETHOXAZOLE-TRIMETHOPRIM 800-160 MG PO TABS: 1.0000 | ORAL_TABLET | Freq: Two times a day (BID) | ORAL | Status: DC

## 2012-09-27 NOTE — Progress Notes (Signed)
[facility] [address], [place] [address] [phone]  Date:  09/27/2012   Name:  Jenny [name]   DOB:  10/26/1927   MRN:  [id number]  PCP:  Kimber Relic, MD    Chief Complaint: Leg Pain   History of Present Illness:  Jenny [name] is a 78 y.o. very pleasant female patient who presents with the following:  Was recently started back on her lasix and the past few days has experienced increased swelling and pain in her distal lower legs and today was unable to wear her compression hose due to pain.  Has erythema and watery drainage through the skin worse on right leg.  No fever or chills.  No history of injury.  No chest pain, cough, shortness of breath.  Patient Active Problem List  Diagnosis  . Keratosis, seborrheic  . Hypertensive renal disease, benign  . Pain in joint involving lower leg  . Hyponatremia  . Hypertension, benign  . Memory loss  . Abnormal weight gain  . Candidiasis of other urogenital sites  . Rash  . Spinal stenosis of lumbar region  . Mild memory disturbance  . Unspecified vitamin D deficiency  . Renal insufficiency  . Edema  . Fatigue  . Congenital factor VIII deficiency  . Functional digestive disorder  . Senile dementia with delusional features  . Weight loss  . Hyperhidrosis  . Spastic colon  . Bronchitis, chronic  . Hypothyroidism  . Depressive disorder  . HTN (hypertension)  . Encounter for long-term (current) use of medications  . Osteoarthritis  . Shortness of breath  . Dementia in conditions classified elsewhere without behavioral disturbance  . Anxiety  . Late effects of CVA (cerebrovascular accident)  . Migraine with aura, without mention of intractable migraine without mention of status migrainosus  . Tinnitus  . Carotid bruit  . Flank pain  . Vertigo  . Hyperlipidemia  . Degenerative arthritis of hip    Past Medical History  Diagnosis Date  . Hypertension   . Coronary artery disease   .  Heart murmur   . Hypothyroidism 05-02-12    tx. Synthroid  . Depression 05-02-12     depression only,tx. oral meds  . Headache 05-02-12    past hx. migrianes-many yrs ago  . Arthritis 05-02-12    osteoarthritis-rt. hip  . Pneumonia 05-02-12    mild case    Past Surgical History  Procedure Date  . Tonsillectomy   . Tubal ligation 05-02-12  .  tummy tuck 05-02-12    many yrs ago  . Cataract extraction, bilateral 05-02-12    bilateral  . Retinal detachment surgery 05-02-12    left  . Total hip arthroplasty 05/09/2012    Procedure: TOTAL HIP ARTHROPLASTY ANTERIOR APPROACH;  Surgeon: Kathryne Hitch, MD;  Location: [facility];  Service: Orthopedics;  Laterality: Right;    History  Substance Use Topics  . Smoking status: Former Games developer  . Smokeless tobacco: Not on file  . Alcohol Use: No    Family History  Problem Relation Age of Onset  . Diabetes Father     Allergies  Allergen Reactions  . Penicillins Rash    Medication list has been reviewed and updated.  Current Outpatient Prescriptions on File Prior to Visit  Medication Sig Dispense Refill  . acetaminophen (TYLENOL) 325 MG tablet Take 650 mg by mouth every 6 (six) hours as needed.      . ALPRAZolam (XANAX) 0.25 MG  tablet Take 0.25 mg by mouth 3 (three) times daily as needed. For anxiety      . ARIPiprazole (ABILIFY) 2 MG tablet Take 1 mg by mouth at bedtime. To help depression and nerves      . Ascorbic Acid (VITAMIN C) 1000 MG tablet Take 1,000 mg by mouth daily with breakfast.      . brimonidine-timolol (COMBIGAN) 0.2-0.5 % ophthalmic solution Place 1 drop into both eyes every 12 (twelve) hours.        . Calcium Carbonate-Vit D-Min (CALCIUM 1200 PO) Take 1 tablet by mouth daily.       . Cholecalciferol (VITAMIN D) 2000 UNITS tablet Take 2,000 Units by mouth daily. For vitamin D supplementation      . cycloSPORINE (RESTASIS) 0.05 % ophthalmic emulsion Place 1 drop into both eyes 2 (two) times daily. In both eyes      .  furosemide (LASIX) 40 MG tablet Take 80 mg by mouth daily with breakfast. To help with swelling      . lamoTRIgine (LAMICTAL) 150 MG tablet Take 150 mg by mouth daily with breakfast.       . levothyroxine (SYNTHROID, LEVOTHROID) 88 MCG tablet Take 88 mcg by mouth daily with breakfast. For thyroid      . nebivolol (BYSTOLIC) 10 MG tablet Take 10 mg by mouth daily with breakfast. To control blood pressure      . sertraline (ZOLOFT) 50 MG tablet Take 100 mg by mouth daily with breakfast. For depression,nerves and anxiety      . vitamin E 200 UNIT capsule Take 200 Units by mouth daily with breakfast.         Review of Systems:  As per HPI, otherwise negative.    Physical Examination: Filed Vitals:   09/27/12 1543  BP: 158/89  Pulse: 67  Temp: 97.8 F (36.6 C)  Resp: 18   Filed Vitals:   09/27/12 1543  Height: 5' 1.75" (1.568 m)  Weight: 156 lb (70.761 kg)   Body mass index is 28.76 kg/(m^2). Ideal Body Weight: Weight in (lb) to have BMI = 25: 135.3   GEN: WDWN, NAD, Non-toxic, A & O x 3 HEENT: Atraumatic, Normocephalic. Neck supple. No masses, No LAD. Ears and Nose: No external deformity. CV: RRR, No M/G/R. No JVD. No thrill. No extra heart sounds. PULM: CTA B, no wheezes, crackles, rhonchi. No retractions. No resp. distress. No accessory muscle use. ABD: S, NT, ND, +BS. No rebound. No HSM. EXTR: No c/c.  Has edema both lower legs to mid calf.  Cellulitis right lower leg to mid calf and left to above ankle.  Multiple skin lesions. NEURO Normal gait.  PSYCH: Normally interactive. Conversant. Not depressed or anxious appearing.  Calm demeanor.    Assessment and Plan: Cellulitis legs Culture Septra ds Clindamycin Elevate Follow up Tuesday  Carmelina Dane, MD

## 2012-09-30 ENCOUNTER — Telehealth: Payer: Self-pay | Admitting: Radiology

## 2012-09-30 ENCOUNTER — Ambulatory Visit (HOSPITAL_COMMUNITY)
Admission: RE | Admit: 2012-09-30 | Discharge: 2012-09-30 | Disposition: A | Discharge status: Home / Self Care | Payer: Medicare Other | Source: Ambulatory Visit | Attending: Emergency Medicine | Admitting: Emergency Medicine

## 2012-09-30 ENCOUNTER — Ambulatory Visit (INDEPENDENT_AMBULATORY_CARE_PROVIDER_SITE_OTHER): Payer: Medicare Other | Admitting: Emergency Medicine

## 2012-09-30 VITALS — BP 166/70 | HR 67 | Temp 98.0°F | Resp 18 | Ht 62.0 in | Wt 160.0 lb

## 2012-09-30 DIAGNOSIS — M7989 Other specified soft tissue disorders: Secondary | ICD-10-CM

## 2012-09-30 DIAGNOSIS — R609 Edema, unspecified: Secondary | ICD-10-CM | POA: Insufficient documentation

## 2012-09-30 DIAGNOSIS — R600 Localized edema: Secondary | ICD-10-CM

## 2012-09-30 DIAGNOSIS — L0390 Cellulitis, unspecified: Secondary | ICD-10-CM

## 2012-09-30 DIAGNOSIS — M549 Dorsalgia, unspecified: Secondary | ICD-10-CM | POA: Insufficient documentation

## 2012-09-30 DIAGNOSIS — M25559 Pain in unspecified hip: Secondary | ICD-10-CM | POA: Insufficient documentation

## 2012-09-30 LAB — POCT CBC
Lymph, poc: 0.9 (ref 0.6–3.4)
MCHC: 30.9 g/dL — AB (ref 31.8–35.4)
MID (cbc): 0.5 (ref 0–0.9)
MPV: 7.6 fL (ref 0–99.8)
POC Granulocyte: 4 (ref 2–6.9)
POC LYMPH PERCENT: 17.2 %L (ref 10–50)
POC MID %: 9.2 %M (ref 0–12)
RDW, POC: 13.5 %

## 2012-09-30 NOTE — Progress Notes (Signed)
[facility] [address], [place] [address] [phone]  Date:  09/30/2012   Name:  [name]   DOB:  09-24-1927   MRN:  [id number]  PCP:  Kimber Relic, MD    Chief Complaint: Follow-up   History of Present Illness:  Jenny [name] is a 78 y.o. very pleasant female patient who presents with the following:  Seen Sunday for cellulitis lower legs and has clinically improved on antibiotic.  Continues to have significant edema.  No pain.  No fever or chills, shortness of breath or chest pain.  Patient Active Problem List  Diagnosis  . Keratosis, seborrheic  . Hypertensive renal disease, benign  . Pain in joint involving lower leg  . Hyponatremia  . Hypertension, benign  . Memory loss  . Abnormal weight gain  . Candidiasis of other urogenital sites  . Rash  . Spinal stenosis of lumbar region  . Mild memory disturbance  . Unspecified vitamin D deficiency  . Renal insufficiency  . Edema  . Fatigue  . Congenital factor VIII deficiency  . Functional digestive disorder  . Senile dementia with delusional features  . Weight loss  . Hyperhidrosis  . Spastic colon  . Bronchitis, chronic  . Hypothyroidism  . Depressive disorder  . HTN (hypertension)  . Encounter for long-term (current) use of medications  . Osteoarthritis  . Shortness of breath  . Dementia in conditions classified elsewhere without behavioral disturbance  . Anxiety  . Late effects of CVA (cerebrovascular accident)  . Migraine with aura, without mention of intractable migraine without mention of status migrainosus  . Tinnitus  . Carotid bruit  . Flank pain  . Vertigo  . Hyperlipidemia  . Degenerative arthritis of hip    Past Medical History  Diagnosis Date  . Hypertension   . Coronary artery disease   . Heart murmur   . Hypothyroidism 05-02-12    tx. Synthroid  . Depression 05-02-12     depression only,tx. oral meds  . Headache 05-02-12    past hx. migrianes-many yrs  ago  . Arthritis 05-02-12    osteoarthritis-rt. hip  . Pneumonia 05-02-12    mild case    Past Surgical History  Procedure Date  . Tonsillectomy   . Tubal ligation 05-02-12  .  tummy tuck 05-02-12    many yrs ago  . Cataract extraction, bilateral 05-02-12    bilateral  . Retinal detachment surgery 05-02-12    left  . Total hip arthroplasty 05/09/2012    Procedure: TOTAL HIP ARTHROPLASTY ANTERIOR APPROACH;  Surgeon: Kathryne Hitch, MD;  Location: [facility];  Service: Orthopedics;  Laterality: Right;    History  Substance Use Topics  . Smoking status: Former Games developer  . Smokeless tobacco: Not on file  . Alcohol Use: No    Family History  Problem Relation Age of Onset  . Diabetes Father     Allergies  Allergen Reactions  . Penicillins Rash    Medication list has been reviewed and updated.  Current Outpatient Prescriptions on File Prior to Visit  Medication Sig Dispense Refill  . acetaminophen (TYLENOL) 325 MG tablet Take 650 mg by mouth every 6 (six) hours as needed.      . ALPRAZolam (XANAX) 0.25 MG tablet Take 0.25 mg by mouth 3 (three) times daily as needed. For anxiety      . ARIPiprazole (ABILIFY) 2 MG tablet Take 1 mg by mouth at bedtime. To help depression  and nerves      . Ascorbic Acid (VITAMIN C) 1000 MG tablet Take 1,000 mg by mouth daily with breakfast.      . brimonidine-timolol (COMBIGAN) 0.2-0.5 % ophthalmic solution Place 1 drop into both eyes every 12 (twelve) hours.        . Calcium Carbonate-Vit D-Min (CALCIUM 1200 PO) Take 1 tablet by mouth daily.       . Cholecalciferol (VITAMIN D) 2000 UNITS tablet Take 2,000 Units by mouth daily. For vitamin D supplementation      . clindamycin (CLEOCIN) 150 MG capsule Take 1 capsule (150 mg total) by mouth 4 (four) times daily.  40 capsule  0  . cycloSPORINE (RESTASIS) 0.05 % ophthalmic emulsion Place 1 drop into both eyes 2 (two) times daily. In both eyes      . furosemide (LASIX) 40 MG tablet Take 80 mg by mouth  daily with breakfast. To help with swelling      . lamoTRIgine (LAMICTAL) 150 MG tablet Take 150 mg by mouth daily with breakfast.       . levothyroxine (SYNTHROID, LEVOTHROID) 88 MCG tablet Take 88 mcg by mouth daily with breakfast. For thyroid      . nebivolol (BYSTOLIC) 10 MG tablet Take 10 mg by mouth daily with breakfast. To control blood pressure      . sertraline (ZOLOFT) 50 MG tablet Take 100 mg by mouth daily with breakfast. For depression,nerves and anxiety      . sulfamethoxazole-trimethoprim (BACTRIM DS,SEPTRA DS) 800-160 MG per tablet Take 1 tablet by mouth 2 (two) times daily.  20 tablet  0  . vitamin E 200 UNIT capsule Take 200 Units by mouth daily with breakfast.         Review of Systems:  As per HPI, otherwise negative.    Physical Examination: Filed Vitals:   09/30/12 1527  BP: 166/70  Pulse: 67  Temp: 98 F (36.7 C)  Resp: 18   Filed Vitals:   09/30/12 1527  Height: 5\' 2"  (1.575 m)  Weight: 160 lb (72.576 kg)   Body mass index is 29.26 kg/(m^2). Ideal Body Weight: Weight in (lb) to have BMI = 25: 136.4    GEN: WDWN, NAD, Non-toxic, Alert & Oriented x 3 HEENT: Atraumatic, Normocephalic.  Ears and Nose: No external deformity. EXTR: No clubbing/cyanosis/edema NEURO: Normal gait.  PSYCH: Normally interactive. Conversant. Not depressed or anxious appearing.  Calm demeanor.  LE's  Bilateral edema to upper lower leg with improving cellulitis   Assessment and Plan: Check venous doppler  CBC Cellulitis   Carmelina Dane, MD  Results for orders placed in visit on 09/30/12  POCT CBC      Result Value Range   WBC 5.5  4.6 - 10.2 K/uL   Lymph, poc 0.9  0.6 - 3.4   POC LYMPH PERCENT 17.2  10 - 50 %L   MID (cbc) 0.5  0 - 0.9   POC MID % 9.2  0 - 12 %M   POC Granulocyte 4.0  2 - 6.9   Granulocyte percent 73.6  37 - 80 %G   RBC 5.22  4.04 - 5.48 M/uL   Hemoglobin 15.2  12.2 - 16.2 g/dL   HCT, POC 78.4 (*) 69.6 - 47.9 %   MCV 94.3  80 - 97 fL   MCH,  POC 29.1  27 - 31.2 pg   MCHC 30.9 (*) 31.8 - 35.4 g/dL   RDW, POC 29.5     Platelet  Count, POC 261  142 - 424 K/uL   MPV 7.6  0 - 99.8 fL

## 2012-09-30 NOTE — Telephone Encounter (Signed)
Patients doppler scan was negative for DVT or superficial clot, she is advise to get the rx and to follow up in one week with Dr Dareen Piano. She is to call me if she gets worse.

## 2012-09-30 NOTE — Progress Notes (Signed)
VASCULAR LAB PRELIMINARY  PRELIMINARY  PRELIMINARY  PRELIMINARY  Bilateral LEV completed.    Preliminary report:  Bilateral:  No evidence of DVT, superficial thrombosis, or Baker's Cyst.  Report called to Amy.   Jenny [name], 09/30/2012, 6:13 PM

## 2012-09-30 NOTE — Patient Instructions (Addendum)
Go to [place] [facility] today at 5pm. For the doppler study. Go to 1st floor admitting.

## 2012-10-01 ENCOUNTER — Other Ambulatory Visit: Payer: Medicare Other

## 2012-10-03 NOTE — Progress Notes (Signed)
Reviewed and agree.

## 2012-10-08 ENCOUNTER — Encounter: Payer: Medicare Other | Admitting: Physical Medicine and Rehabilitation

## 2012-10-10 ENCOUNTER — Encounter (HOSPITAL_COMMUNITY): Payer: Self-pay | Admitting: Emergency Medicine

## 2012-10-10 ENCOUNTER — Inpatient Hospital Stay (HOSPITAL_COMMUNITY): Payer: Medicare Other

## 2012-10-10 ENCOUNTER — Inpatient Hospital Stay (HOSPITAL_COMMUNITY)
Admission: EM | Admit: 2012-10-10 | Discharge: 2012-10-13 | DRG: 543 | Disposition: A | Discharge status: Skilled Nursing Facility | Payer: Medicare Other | Attending: Internal Medicine | Admitting: Internal Medicine

## 2012-10-10 ENCOUNTER — Emergency Department (HOSPITAL_COMMUNITY): Payer: Medicare Other

## 2012-10-10 DIAGNOSIS — M4800 Spinal stenosis, site unspecified: Secondary | ICD-10-CM | POA: Diagnosis present

## 2012-10-10 DIAGNOSIS — M8448XA Pathological fracture, other site, initial encounter for fracture: Principal | ICD-10-CM | POA: Diagnosis present

## 2012-10-10 DIAGNOSIS — R61 Generalized hyperhidrosis: Secondary | ICD-10-CM

## 2012-10-10 DIAGNOSIS — R21 Rash and other nonspecific skin eruption: Secondary | ICD-10-CM

## 2012-10-10 DIAGNOSIS — F32A Depression, unspecified: Secondary | ICD-10-CM

## 2012-10-10 DIAGNOSIS — D66 Hereditary factor VIII deficiency: Secondary | ICD-10-CM

## 2012-10-10 DIAGNOSIS — Z96649 Presence of unspecified artificial hip joint: Secondary | ICD-10-CM

## 2012-10-10 DIAGNOSIS — K589 Irritable bowel syndrome without diarrhea: Secondary | ICD-10-CM

## 2012-10-10 DIAGNOSIS — S32519A Fracture of superior rim of unspecified pubis, initial encounter for closed fracture: Secondary | ICD-10-CM

## 2012-10-10 DIAGNOSIS — R42 Dizziness and giddiness: Secondary | ICD-10-CM

## 2012-10-10 DIAGNOSIS — F0392 Unspecified dementia, unspecified severity, with psychotic disturbance: Secondary | ICD-10-CM

## 2012-10-10 DIAGNOSIS — M48061 Spinal stenosis, lumbar region without neurogenic claudication: Secondary | ICD-10-CM

## 2012-10-10 DIAGNOSIS — E871 Hypo-osmolality and hyponatremia: Secondary | ICD-10-CM

## 2012-10-10 DIAGNOSIS — I6990 Unspecified sequelae of unspecified cerebrovascular disease: Secondary | ICD-10-CM

## 2012-10-10 DIAGNOSIS — G43109 Migraine with aura, not intractable, without status migrainosus: Secondary | ICD-10-CM

## 2012-10-10 DIAGNOSIS — R109 Unspecified abdominal pain: Secondary | ICD-10-CM

## 2012-10-10 DIAGNOSIS — S3282XA Multiple fractures of pelvis without disruption of pelvic ring, initial encounter for closed fracture: Secondary | ICD-10-CM

## 2012-10-10 DIAGNOSIS — E875 Hyperkalemia: Secondary | ICD-10-CM

## 2012-10-10 DIAGNOSIS — R413 Other amnesia: Secondary | ICD-10-CM

## 2012-10-10 DIAGNOSIS — E785 Hyperlipidemia, unspecified: Secondary | ICD-10-CM

## 2012-10-10 DIAGNOSIS — L03119 Cellulitis of unspecified part of limb: Secondary | ICD-10-CM | POA: Diagnosis present

## 2012-10-10 DIAGNOSIS — L821 Other seborrheic keratosis: Secondary | ICD-10-CM

## 2012-10-10 DIAGNOSIS — R609 Edema, unspecified: Secondary | ICD-10-CM

## 2012-10-10 DIAGNOSIS — R635 Abnormal weight gain: Secondary | ICD-10-CM

## 2012-10-10 DIAGNOSIS — F419 Anxiety disorder, unspecified: Secondary | ICD-10-CM

## 2012-10-10 DIAGNOSIS — Z79899 Other long term (current) drug therapy: Secondary | ICD-10-CM

## 2012-10-10 DIAGNOSIS — E559 Vitamin D deficiency, unspecified: Secondary | ICD-10-CM

## 2012-10-10 DIAGNOSIS — K5900 Constipation, unspecified: Secondary | ICD-10-CM

## 2012-10-10 DIAGNOSIS — R011 Cardiac murmur, unspecified: Secondary | ICD-10-CM | POA: Diagnosis present

## 2012-10-10 DIAGNOSIS — E860 Dehydration: Secondary | ICD-10-CM

## 2012-10-10 DIAGNOSIS — M25569 Pain in unspecified knee: Secondary | ICD-10-CM

## 2012-10-10 DIAGNOSIS — J42 Unspecified chronic bronchitis: Secondary | ICD-10-CM

## 2012-10-10 DIAGNOSIS — R0989 Other specified symptoms and signs involving the circulatory and respiratory systems: Secondary | ICD-10-CM

## 2012-10-10 DIAGNOSIS — M1990 Unspecified osteoarthritis, unspecified site: Secondary | ICD-10-CM

## 2012-10-10 DIAGNOSIS — Z87891 Personal history of nicotine dependence: Secondary | ICD-10-CM

## 2012-10-10 DIAGNOSIS — F0280 Dementia in other diseases classified elsewhere without behavioral disturbance: Secondary | ICD-10-CM

## 2012-10-10 DIAGNOSIS — F0390 Unspecified dementia without behavioral disturbance: Secondary | ICD-10-CM

## 2012-10-10 DIAGNOSIS — R10A0 Flank pain, unspecified side: Secondary | ICD-10-CM

## 2012-10-10 DIAGNOSIS — I2510 Atherosclerotic heart disease of native coronary artery without angina pectoris: Secondary | ICD-10-CM

## 2012-10-10 DIAGNOSIS — F3289 Other specified depressive episodes: Secondary | ICD-10-CM | POA: Diagnosis present

## 2012-10-10 DIAGNOSIS — S32810A Multiple fractures of pelvis with stable disruption of pelvic ring, initial encounter for closed fracture: Secondary | ICD-10-CM

## 2012-10-10 DIAGNOSIS — I129 Hypertensive chronic kidney disease with stage 1 through stage 4 chronic kidney disease, or unspecified chronic kidney disease: Secondary | ICD-10-CM

## 2012-10-10 DIAGNOSIS — L02419 Cutaneous abscess of limb, unspecified: Secondary | ICD-10-CM | POA: Diagnosis present

## 2012-10-10 DIAGNOSIS — K929 Disease of digestive system, unspecified: Secondary | ICD-10-CM

## 2012-10-10 DIAGNOSIS — R634 Abnormal weight loss: Secondary | ICD-10-CM

## 2012-10-10 DIAGNOSIS — R5383 Other fatigue: Secondary | ICD-10-CM

## 2012-10-10 DIAGNOSIS — N289 Disorder of kidney and ureter, unspecified: Secondary | ICD-10-CM

## 2012-10-10 DIAGNOSIS — Z8673 Personal history of transient ischemic attack (TIA), and cerebral infarction without residual deficits: Secondary | ICD-10-CM

## 2012-10-10 DIAGNOSIS — M169 Osteoarthritis of hip, unspecified: Secondary | ICD-10-CM

## 2012-10-10 DIAGNOSIS — R0602 Shortness of breath: Secondary | ICD-10-CM

## 2012-10-10 DIAGNOSIS — I10 Essential (primary) hypertension: Secondary | ICD-10-CM

## 2012-10-10 DIAGNOSIS — N179 Acute kidney failure, unspecified: Secondary | ICD-10-CM

## 2012-10-10 DIAGNOSIS — E039 Hypothyroidism, unspecified: Secondary | ICD-10-CM

## 2012-10-10 DIAGNOSIS — L03115 Cellulitis of right lower limb: Secondary | ICD-10-CM

## 2012-10-10 DIAGNOSIS — I6930 Unspecified sequelae of cerebral infarction: Secondary | ICD-10-CM

## 2012-10-10 DIAGNOSIS — B3749 Other urogenital candidiasis: Secondary | ICD-10-CM

## 2012-10-10 DIAGNOSIS — M949 Disorder of cartilage, unspecified: Secondary | ICD-10-CM | POA: Diagnosis present

## 2012-10-10 DIAGNOSIS — F329 Major depressive disorder, single episode, unspecified: Secondary | ICD-10-CM | POA: Diagnosis present

## 2012-10-10 DIAGNOSIS — M899 Disorder of bone, unspecified: Secondary | ICD-10-CM | POA: Diagnosis present

## 2012-10-10 LAB — URINALYSIS, ROUTINE W REFLEX MICROSCOPIC
Glucose, UA: NEGATIVE mg/dL
Leukocytes, UA: NEGATIVE
Nitrite: NEGATIVE
Protein, ur: NEGATIVE mg/dL

## 2012-10-10 LAB — CBC WITH DIFFERENTIAL/PLATELET
Basophils Absolute: 0.1 10*3/uL (ref 0.0–0.1)
Lymphocytes Relative: 13 % (ref 12–46)
Monocytes Relative: 13 % — ABNORMAL HIGH (ref 3–12)
Neutrophils Relative %: 69 % (ref 43–77)
Platelets: 405 10*3/uL — ABNORMAL HIGH (ref 150–400)
RDW: 13.4 % (ref 11.5–15.5)
WBC: 8.2 10*3/uL (ref 4.0–10.5)

## 2012-10-10 LAB — BASIC METABOLIC PANEL
Chloride: 86 mEq/L — ABNORMAL LOW (ref 96–112)
Creatinine, Ser: 1.38 mg/dL — ABNORMAL HIGH (ref 0.50–1.10)
GFR calc Af Amer: 40 mL/min — ABNORMAL LOW (ref >90)
Potassium: 5.1 mEq/L (ref 3.5–5.1)
Sodium: 121 mEq/L — ABNORMAL LOW (ref 135–145)

## 2012-10-10 LAB — PROTIME-INR: Prothrombin Time: 13.3 seconds (ref 11.6–15.2)

## 2012-10-10 MED ORDER — CALCITONIN (SALMON) 200 UNIT/ACT NA SOLN
1.0000 | Freq: Every day | NASAL | Status: DC
  Administered 2012-10-10 – 2012-10-13 (×4): 1 via NASAL
  Filled 2012-10-10: qty 3.7

## 2012-10-10 MED ORDER — ONDANSETRON HCL 4 MG/2ML IJ SOLN: 4.0000 mg | Freq: Four times a day (QID) | INTRAMUSCULAR | Status: DC | PRN

## 2012-10-10 MED ORDER — TIMOLOL MALEATE 0.5 % OP SOLN
1.0000 [drp] | Freq: Two times a day (BID) | OPHTHALMIC | Status: DC
  Administered 2012-10-10 – 2012-10-13 (×6): 1 [drp] via OPHTHALMIC
  Filled 2012-10-10: qty 5

## 2012-10-10 MED ORDER — ALPRAZOLAM 0.25 MG PO TABS
0.2500 mg | ORAL_TABLET | Freq: Three times a day (TID) | ORAL | Status: DC
  Administered 2012-10-10 – 2012-10-13 (×8): 0.25 mg via ORAL
  Filled 2012-10-10 (×8): qty 1

## 2012-10-10 MED ORDER — SERTRALINE HCL 100 MG PO TABS
100.0000 mg | ORAL_TABLET | Freq: Every day | ORAL | Status: DC
  Administered 2012-10-11 – 2012-10-13 (×3): 100 mg via ORAL
  Filled 2012-10-10 (×3): qty 1

## 2012-10-10 MED ORDER — COUMADIN BOOK
Freq: Once | Status: AC
  Administered 2012-10-11: 11:00:00
  Filled 2012-10-10: qty 1

## 2012-10-10 MED ORDER — SODIUM CHLORIDE 0.9 % IV SOLN: INTRAVENOUS | Status: DC

## 2012-10-10 MED ORDER — ARIPIPRAZOLE 5 MG PO TABS
5.0000 mg | ORAL_TABLET | Freq: Every day | ORAL | Status: DC
  Administered 2012-10-10 – 2012-10-12 (×3): 5 mg via ORAL
  Filled 2012-10-10 (×4): qty 1

## 2012-10-10 MED ORDER — ASPIRIN EC 81 MG PO TBEC
81.0000 mg | DELAYED_RELEASE_TABLET | Freq: Every day | ORAL | Status: DC
  Administered 2012-10-11 – 2012-10-13 (×3): 81 mg via ORAL
  Filled 2012-10-10 (×3): qty 1

## 2012-10-10 MED ORDER — ZOLPIDEM TARTRATE 5 MG PO TABS: 5.0000 mg | ORAL_TABLET | Freq: Every evening | ORAL | Status: DC | PRN

## 2012-10-10 MED ORDER — CALCIUM CARBONATE-VITAMIN D 500-200 MG-UNIT PO TABS
1.0000 | ORAL_TABLET | Freq: Three times a day (TID) | ORAL | Status: DC
  Administered 2012-10-10 – 2012-10-13 (×8): 1 via ORAL
  Filled 2012-10-10 (×10): qty 1

## 2012-10-10 MED ORDER — CYCLOSPORINE 0.05 % OP EMUL
1.0000 [drp] | Freq: Two times a day (BID) | OPHTHALMIC | Status: DC
  Administered 2012-10-10 – 2012-10-13 (×6): 1 [drp] via OPHTHALMIC
  Filled 2012-10-10 (×7): qty 1

## 2012-10-10 MED ORDER — HYDROCODONE-ACETAMINOPHEN 5-325 MG PO TABS: 1.0000 | ORAL_TABLET | Freq: Four times a day (QID) | ORAL | Status: DC | PRN

## 2012-10-10 MED ORDER — VITAMIN C 500 MG PO TABS
1000.0000 mg | ORAL_TABLET | Freq: Every day | ORAL | Status: DC
  Administered 2012-10-11 – 2012-10-13 (×3): 1000 mg via ORAL
  Filled 2012-10-10 (×3): qty 2

## 2012-10-10 MED ORDER — ONDANSETRON HCL 4 MG/2ML IJ SOLN: 4.0000 mg | Freq: Three times a day (TID) | INTRAMUSCULAR | Status: DC | PRN

## 2012-10-10 MED ORDER — PANTOPRAZOLE SODIUM 40 MG PO TBEC
40.0000 mg | DELAYED_RELEASE_TABLET | Freq: Every day | ORAL | Status: DC
  Administered 2012-10-11 – 2012-10-13 (×3): 40 mg via ORAL
  Filled 2012-10-10 (×3): qty 1

## 2012-10-10 MED ORDER — SODIUM CHLORIDE 0.9 % IJ SOLN: 3.0000 mL | Freq: Two times a day (BID) | INTRAMUSCULAR | Status: DC

## 2012-10-10 MED ORDER — BRIMONIDINE TARTRATE-TIMOLOL 0.2-0.5 % OP SOLN: 1.0000 [drp] | Freq: Two times a day (BID) | OPHTHALMIC | Status: DC

## 2012-10-10 MED ORDER — WARFARIN VIDEO
Freq: Once | Status: AC
  Administered 2012-10-11: 16:00:00

## 2012-10-10 MED ORDER — MORPHINE SULFATE 2 MG/ML IJ SOLN
2.0000 mg | Freq: Once | INTRAMUSCULAR | Status: AC
  Administered 2012-10-10: 2 mg via INTRAVENOUS
  Filled 2012-10-10: qty 1

## 2012-10-10 MED ORDER — LAMOTRIGINE 150 MG PO TABS
150.0000 mg | ORAL_TABLET | Freq: Every day | ORAL | Status: DC
  Administered 2012-10-10 – 2012-10-12 (×3): 150 mg via ORAL
  Filled 2012-10-10 (×4): qty 1

## 2012-10-10 MED ORDER — SODIUM CHLORIDE 0.9 % IJ SOLN: 3.0000 mL | INTRAMUSCULAR | Status: DC | PRN

## 2012-10-10 MED ORDER — SODIUM CHLORIDE 0.9 % IV SOLN
INTRAVENOUS | Status: DC
  Administered 2012-10-10: 18:00:00 via INTRAVENOUS

## 2012-10-10 MED ORDER — ACETAMINOPHEN 325 MG PO TABS
650.0000 mg | ORAL_TABLET | Freq: Three times a day (TID) | ORAL | Status: DC
  Administered 2012-10-10 – 2012-10-13 (×8): 650 mg via ORAL
  Filled 2012-10-10 (×10): qty 2

## 2012-10-10 MED ORDER — MORPHINE SULFATE 4 MG/ML IJ SOLN
4.0000 mg | Freq: Once | INTRAMUSCULAR | Status: AC
  Administered 2012-10-10: 4 mg via INTRAVENOUS
  Filled 2012-10-10: qty 1

## 2012-10-10 MED ORDER — ONDANSETRON HCL 4 MG/2ML IJ SOLN
4.0000 mg | Freq: Once | INTRAMUSCULAR | Status: AC
  Administered 2012-10-10: 4 mg via INTRAVENOUS
  Filled 2012-10-10: qty 2

## 2012-10-10 MED ORDER — FLEET ENEMA 7-19 GM/118ML RE ENEM: 1.0000 | ENEMA | Freq: Every day | RECTAL | Status: DC | PRN

## 2012-10-10 MED ORDER — NEBIVOLOL HCL 10 MG PO TABS
10.0000 mg | ORAL_TABLET | Freq: Every day | ORAL | Status: DC
  Administered 2012-10-11 – 2012-10-13 (×3): 10 mg via ORAL
  Filled 2012-10-10 (×5): qty 1

## 2012-10-10 MED ORDER — LEVOTHYROXINE SODIUM 88 MCG PO TABS
88.0000 ug | ORAL_TABLET | Freq: Every day | ORAL | Status: DC
  Administered 2012-10-11 – 2012-10-13 (×3): 88 ug via ORAL
  Filled 2012-10-10 (×5): qty 1

## 2012-10-10 MED ORDER — MORPHINE SULFATE 4 MG/ML IJ SOLN: 4.0000 mg | INTRAMUSCULAR | Status: DC | PRN

## 2012-10-10 MED ORDER — BRIMONIDINE TARTRATE 0.2 % OP SOLN
1.0000 [drp] | Freq: Two times a day (BID) | OPHTHALMIC | Status: DC
  Administered 2012-10-10 – 2012-10-13 (×6): 1 [drp] via OPHTHALMIC
  Filled 2012-10-10: qty 5

## 2012-10-10 MED ORDER — WARFARIN - PHARMACIST DOSING INPATIENT: Freq: Every day | Status: DC

## 2012-10-10 MED ORDER — WARFARIN SODIUM 2.5 MG PO TABS
2.5000 mg | ORAL_TABLET | Freq: Once | ORAL | Status: AC
  Administered 2012-10-10: 2.5 mg via ORAL
  Filled 2012-10-10: qty 1

## 2012-10-10 MED ORDER — MAGNESIUM CITRATE PO SOLN
0.5000 | Freq: Once | ORAL | Status: AC
  Administered 2012-10-10: 0.5 via ORAL

## 2012-10-10 MED ORDER — SODIUM CHLORIDE 0.9 % IV SOLN
INTRAVENOUS | Status: DC
  Administered 2012-10-10: 15:00:00 via INTRAVENOUS

## 2012-10-10 MED ORDER — SODIUM CHLORIDE 0.9 % IV SOLN: 250.0000 mL | INTRAVENOUS | Status: DC | PRN

## 2012-10-10 MED ORDER — ONDANSETRON HCL 4 MG PO TABS: 4.0000 mg | ORAL_TABLET | Freq: Four times a day (QID) | ORAL | Status: DC | PRN

## 2012-10-10 MED ORDER — MORPHINE SULFATE 2 MG/ML IJ SOLN: 2.0000 mg | INTRAMUSCULAR | Status: DC | PRN

## 2012-10-10 MED ORDER — OXYCODONE HCL 5 MG PO TABS
5.0000 mg | ORAL_TABLET | ORAL | Status: DC | PRN
  Administered 2012-10-10 – 2012-10-13 (×8): 5 mg via ORAL
  Filled 2012-10-10 (×8): qty 1

## 2012-10-10 MED ORDER — HEPARIN SODIUM (PORCINE) 5000 UNIT/ML IJ SOLN
5000.0000 [IU] | Freq: Three times a day (TID) | INTRAMUSCULAR | Status: DC
  Administered 2012-10-10 – 2012-10-13 (×8): 5000 [IU] via SUBCUTANEOUS
  Filled 2012-10-10 (×11): qty 1

## 2012-10-10 MED ORDER — POLYETHYLENE GLYCOL 3350 17 G PO PACK
17.0000 g | PACK | Freq: Two times a day (BID) | ORAL | Status: DC
  Administered 2012-10-10 – 2012-10-12 (×3): 17 g via ORAL
  Filled 2012-10-10 (×7): qty 1

## 2012-10-10 MED ORDER — SODIUM CHLORIDE 0.9 % IJ SOLN
3.0000 mL | Freq: Two times a day (BID) | INTRAMUSCULAR | Status: DC
  Administered 2012-10-11 – 2012-10-13 (×4): 3 mL via INTRAVENOUS

## 2012-10-10 NOTE — Progress Notes (Signed)
ANTICOAGULATION CONSULT NOTE - Initial Consult  Pharmacy Consult for Warfarin Indication: VTE prophylaxis  Allergies  Allergen Reactions  . Penicillins Rash    Patient Measurements: Height: 5\' 2"  (157.5 cm) Weight: 158 lb 1.1 oz (71.7 kg) IBW/kg (Calculated) : 50.1    Vital Signs: Temp: 98 F (36.7 C) (11/29 1640) Temp src: Oral (11/29 1640) BP: 119/44 mmHg (11/29 1640) Pulse Rate: 48  (11/29 1640)  Labs:  Basename 10/10/12 1136  HGB 11.0*  HCT 32.2*  PLT 405*  APTT --  LABPROT --  INR --  HEPARINUNFRC --  CREATININE 1.38*  CKTOTAL --  CKMB --  TROPONINI --    Estimated Creatinine Clearance: 29.1 ml/min (by C-G formula based on Cr of 1.38).   Medical History: Past Medical History  Diagnosis Date  . Hypertension   . Coronary artery disease   . Heart murmur   . Hypothyroidism 05-02-12    tx. Synthroid  . Depression 05-02-12     depression only,tx. oral meds  . Headache 05-02-12    past hx. migrianes-many yrs ago  . Arthritis 05-02-12    osteoarthritis-rt. hip  . Pneumonia 05-02-12    mild case    Medications:  Scheduled:    . acetaminophen  650 mg Oral TID  . ALPRAZolam  0.25 mg Oral TID  . ARIPiprazole  5 mg Oral QHS  . aspirin EC  81 mg Oral Daily  . brimonidine  1 drop Both Eyes Q12H   And  . timolol  1 drop Both Eyes Q12H  . calcitonin (salmon)  1 spray Alternating Nares Daily  . calcium-vitamin D  1 tablet Oral TID  . cycloSPORINE  1 drop Both Eyes BID  . heparin  5,000 Units Subcutaneous Q8H  . lamoTRIgine  150 mg Oral QHS  . levothyroxine  88 mcg Oral QAC breakfast  . [COMPLETED] magnesium citrate  0.5 Bottle Oral Once  . [COMPLETED]  morphine injection  2 mg Intravenous Once  . [COMPLETED]  morphine injection  4 mg Intravenous Once  . [COMPLETED]  morphine injection  4 mg Intravenous Once  . nebivolol  10 mg Oral Q breakfast  . [COMPLETED] ondansetron  4 mg Intravenous Once  . pantoprazole  40 mg Oral Daily  . polyethylene glycol  17  g Oral BID  . sertraline  100 mg Oral Daily  . sodium chloride  3 mL Intravenous Q12H  . sodium chloride  3 mL Intravenous Q12H  . vitamin C  1,000 mg Oral Daily  . [DISCONTINUED] sodium chloride   Intravenous STAT  . [DISCONTINUED] brimonidine-timolol  1 drop Both Eyes Q12H   PRN: sodium chloride, morphine injection, ondansetron (ZOFRAN) IV, ondansetron, oxyCODONE, sodium chloride, sodium phosphate, zolpidem, [DISCONTINUED] HYDROcodone-acetaminophen, [DISCONTINUED]  morphine injection, [DISCONTINUED] ondansetron (ZOFRAN) IV  Assessment: .78 yo female with bilateral pubic rami fractures without h/o trauma .Discussed with Dr. Otelia Sergeant, warfarin for VTE prophylaxis for decreased activity and would like INR goal 1.5-2 .Heparin 5000 units SQ TID until INR>=1.5 per MD .Baseline INR=1.02  Goal of Therapy:  INR 1.5-2 per MD    Plan:  Will give Warfarin 2.5mg  po x 1 tonight Will check daily PT/INR Coumadin book and education  Nicanor Bake D 10/10/2012,7:28 PM

## 2012-10-10 NOTE — ED Notes (Signed)
Pt from [facility].  Reports L hip pain that began a week ago.  Pt reports that pain began on R hip and spread to L hip.  Reports pain has gotten worse throughout the week.  Pt had R hip replaced in July.   Pt denies any injuries/falls in last week.  Pt states other hip was replaced for worsening arthritis pain.  Pt able to move leg, states it is difficult to walk because of pain.  Sensation intact, pedal pulse present bilaterally.  No shortening or external rotation noted.

## 2012-10-10 NOTE — H&P (Signed)
PCP:   Kimber Relic, MD   Chief Complaint:  Hip pains.   HPI: This is an 77 year old female, with known history of HTN, s/p CVA, CAD, dyslipidemia, hypothyroidism, depression, dementia, OA, Spinal stenosis, s/p right total knee arthroplasty 04/2012, s/p bilateral cataract surgery, s/p surgery for retinal detachment. According to patient and family, who were present in the ED, patient has ben experiencing pain around the left hip and upper part of her left leg for several months, and this has become much worse in the last 10 days, with increasing difficulty in ambulation. She has had no falls or other antecedent trauma, and on 09/27/12, was evaluated at the [facility], for bilateral LE pains and redness, with oozing fluid, and was placed on a 10-day course of oral Clindamycin and Bactrim DS, which she has now completed. LE venous doppler on 09/30/2012. She has had no bowel movement for 5 days.     Allergies:   Allergies  Allergen Reactions  . Penicillins Rash      Past Medical History  Diagnosis Date  . Hypertension   . Coronary artery disease   . Heart murmur   . Hypothyroidism 05-02-12    tx. Synthroid  . Depression 05-02-12     depression only,tx. oral meds  . Headache 05-02-12    past hx. migrianes-many yrs ago  . Arthritis 05-02-12    osteoarthritis-rt. hip  . Pneumonia 05-02-12    mild case    Past Surgical History  Procedure Date  . Tonsillectomy   . Tubal ligation 05-02-12  .  tummy tuck 05-02-12    many yrs ago  . Cataract extraction, bilateral 05-02-12    bilateral  . Retinal detachment surgery 05-02-12    left  . Total hip arthroplasty 05/09/2012    Procedure: TOTAL HIP ARTHROPLASTY ANTERIOR APPROACH;  Surgeon: Kathryne Hitch, MD;  Location: [facility];  Service: Orthopedics;  Laterality: Right;    Prior to Admission medications   Medication Sig Start Date End Date Taking? Authorizing Provider  acetaminophen (TYLENOL) 325 MG tablet Take 650 mg by mouth 3 (three)  times daily.    Yes Historical Provider, MD  ALPRAZolam (XANAX) 0.25 MG tablet Take 0.25 mg by mouth 3 (three) times daily.    Yes Historical Provider, MD  ARIPiprazole (ABILIFY) 5 MG tablet Take 5 mg by mouth at bedtime.   Yes Historical Provider, MD  Ascorbic Acid (VITAMIN C) 1000 MG tablet Take 1,000 mg by mouth daily with breakfast.   Yes Historical Provider, MD  aspirin EC 81 MG tablet Take 81 mg by mouth daily.   Yes Historical Provider, MD  brimonidine-timolol (COMBIGAN) 0.2-0.5 % ophthalmic solution Place 1 drop into both eyes every 12 (twelve) hours.     Yes Historical Provider, MD  cycloSPORINE (RESTASIS) 0.05 % ophthalmic emulsion Place 1 drop into both eyes 2 (two) times daily. In both eyes   Yes Historical Provider, MD  furosemide (LASIX) 40 MG tablet Take 80 mg by mouth daily with breakfast. To help with swelling   Yes Historical Provider, MD  lamoTRIgine (LAMICTAL) 150 MG tablet Take 150 mg by mouth at bedtime.    Yes Historical Provider, MD  levothyroxine (SYNTHROID, LEVOTHROID) 88 MCG tablet Take 88 mcg by mouth daily with breakfast. For thyroid   Yes Historical Provider, MD  magnesium hydroxide (MILK OF MAGNESIA) 400 MG/5ML suspension Take 30 mLs by mouth daily as needed. For constipation   Yes Historical Provider, MD  naproxen sodium (ANAPROX)  220 MG tablet Take 220 mg by mouth 2 (two) times daily.   Yes Historical Provider, MD  nebivolol (BYSTOLIC) 10 MG tablet Take 10 mg by mouth daily with breakfast. To control blood pressure   Yes Historical Provider, MD  omeprazole (PRILOSEC) 20 MG capsule Take 20 mg by mouth daily.   Yes Historical Provider, MD  oxycodone (OXY-IR) 5 MG capsule Take 10 mg by mouth every 6 (six) hours as needed. For pain   Yes Historical Provider, MD  polyethylene glycol (MIRALAX / GLYCOLAX) packet Take 17 g by mouth daily.   Yes Historical Provider, MD  senna (SENOKOT) 8.6 MG TABS Take 2 tablets by mouth at bedtime.   Yes Historical Provider, MD  sertraline  (ZOLOFT) 100 MG tablet Take 100 mg by mouth daily.   Yes Historical Provider, MD  zaleplon (SONATA) 10 MG capsule Take 10 mg by mouth at bedtime as needed. For insomnia   Yes Historical Provider, MD  clindamycin (CLEOCIN) 150 MG capsule Take 1 capsule (150 mg total) by mouth 4 (four) times daily. 09/27/12   Phillips Odor, MD  sulfamethoxazole-trimethoprim (BACTRIM DS,SEPTRA DS) 800-160 MG per tablet Take 1 tablet by mouth 2 (two) times daily. 09/27/12   Phillips Odor, MD    Social History: Resident of ALF, ambulates with a walker at baseline, reports that she has quit smoking about 4 years ago. She does not have any smokeless tobacco history on file. She reports that she does not drink alcohol or use illicit drugs. She is married with 3 offspring.   Family History  Problem Relation Age of Onset  . Diabetes Father     Review of Systems:  As per HPI and chief complaint. Patent denies fatigue, diminished appetite, weight loss, fever, chills, headache, blurred vision, difficulty in speaking, dysphagia, chest pain, cough, shortness of breath, orthopnea, paroxysmal nocturnal dyspnea, nausea, diaphoresis, abdominal pain, vomiting, diarrhea, belching, heartburn, hematemesis, melena. She has has had severe constipation in the past 5 days, unresponsive to different laxatives. She denies dysuria, nocturia, urinary frequency, hematochezia. Lower extremity swelling, pain and redness have significantly improved. The rest of the systems review is negative.  Physical Exam:  General:  Patient does not appear to be in obvious acute distress at the time of this evaluation, alert, communicative, fully oriented, talking in complete sentences, not short of breath at rest.  HEENT:  No clinical pallor, no jaundice, no conjunctival injection or discharge. Visible buccal mucosa appear "dry".  NECK:  Supple, JVP not seen, no carotid bruits, no palpable lymphadenopathy, no palpable goiter. Has bilateral carotid  bruits.  CHEST:  Clinically clear to auscultation, no wheezes, no crackles. HEART:  Sounds 1 and 2 heard, normal, regular, has systolic murmur at apex. ABDOMEN:  Moderately obese, soft, non-tender, no palpable organomegaly, no palpable masses, normal bowel sounds. GENITALIA:  Not examined. LOWER EXTREMITIES:  Mild pitting edema, very mild erythema, without tenderness, palpable peripheral pulses. MUSCULOSKELETAL SYSTEM:  Generalized osteoarthritic changes. She does have pain on left hip movement, but no obvious bruising.  CENTRAL NERVOUS SYSTEM:  No focal neurologic deficit on gross examination.  Labs on Admission:  Results for orders placed during the hospital encounter of 10/10/12 (from the past 48 hour(s))  CBC WITH DIFFERENTIAL     Status: Abnormal   Collection Time   10/10/12 11:36 AM      Component Value Range Comment   WBC 8.2  4.0 - 10.5 K/uL    RBC 3.70 (*) 3.87 - 5.11 MIL/uL  Hemoglobin 11.0 (*) 12.0 - 15.0 g/dL    HCT 69.6 (*) 29.5 - 46.0 %    MCV 87.0  78.0 - 100.0 fL    MCH 29.7  26.0 - 34.0 pg    MCHC 34.2  30.0 - 36.0 g/dL    RDW 28.4  13.2 - 44.0 %    Platelets 405 (*) 150 - 400 K/uL    Neutrophils Relative 69  43 - 77 %    Lymphocytes Relative 13  12 - 46 %    Monocytes Relative 13 (*) 3 - 12 %    Eosinophils Relative 4  0 - 5 %    Basophils Relative 1  0 - 1 %    Neutro Abs 5.6  1.7 - 7.7 K/uL    Lymphs Abs 1.1  0.7 - 4.0 K/uL    Monocytes Absolute 1.1 (*) 0.1 - 1.0 K/uL    Eosinophils Absolute 0.3  0.0 - 0.7 K/uL    Basophils Absolute 0.1  0.0 - 0.1 K/uL    WBC Morphology MILD LEFT SHIFT (1-5% METAS, OCC MYELO, OCC BANDS)   ATYPICAL LYMPHOCYTES  BASIC METABOLIC PANEL     Status: Abnormal   Collection Time   10/10/12 11:36 AM      Component Value Range Comment   Sodium 121 (*) 135 - 145 mEq/L    Potassium 5.1  3.5 - 5.1 mEq/L    Chloride 86 (*) 96 - 112 mEq/L    CO2 24  19 - 32 mEq/L    Glucose, Bld 125 (*) 70 - 99 mg/dL    BUN 24 (*) 6 - 23 mg/dL     Creatinine, Ser 1.02 (*) 0.50 - 1.10 mg/dL    Calcium 8.8  8.4 - 72.5 mg/dL    GFR calc non Af Amer 35 (*) >90 mL/min    GFR calc Af Amer 40 (*) >90 mL/min     Radiological Exams on Admission: *RADIOLOGY REPORT*  Clinical Data: Back pain, left hip pain  LUMBAR SPINE - COMPLETE 4+ VIEW  Comparison: 11/28/2011  Findings: Five views of the lumbar spine submitted. Diffuse  osteopenia. Multilevel disc space flattening with vacuum disc  phenomenon and anterior spurring. No acute fracture or  subluxation. Atherosclerotic calcifications of the abdominal aorta  and the iliac arteries are noted. There is right hip prosthesis  anatomic alignment. Mild displaced fracture or bilateral superior  pubic ramus. Probable nondisplaced fracture of the right inferior  pubic ramus. Mild displaced fracture of the left inferior pubic  ramus.  IMPRESSION:  No lumbar spine acute fracture or subluxation. Diffuse osteopenia.  Multilevel degenerative changes. Mild displaced fracture bilateral  superior pubic ramus. Mild displaced fracture left inferior pubic  ramus. Probable nondisplaced fracture right inferior pubic ramus.  Original Report Authenticated By: Natasha Mead, M.D.    *RADIOLOGY REPORT*  Clinical Data: Hip pain  LEFT FEMUR - 2 VIEW  Comparison: Left hip same day.  Findings: Two views of the left femur submitted. No acute fracture  or subluxation. Mild spurring of femoral head. Mild degenerative  changes left knee joint.  IMPRESSION:  No acute fracture or subluxation. Mild spurring of femoral head.  Mild degenerative changes left knee joint.  Original Report Authenticated By: Natasha Mead, M.D.     *RADIOLOGY REPORT*  Clinical Data: Hip pain  LEFT HIP - COMPLETE 2+ VIEW  Comparison: 02/20/2012  Findings: Three views of the left hip submitted. Mild spurring of  the left humeral head. There is  mild displaced fracture of the  left superior pubic ramus at the level of the superior acetabulum.    Probable nondisplaced fracture or bilateral inferior pubic ramus.  Mild displaced fracture of the right superior pubic ramus. Right  hip prosthesis in anatomic alignment.  IMPRESSION:  No hip dislocation. Spurring of the left femoral head. Mild  displaced fracture of the left superior pubic ramus at the level of  the left acetabulum. Probable nondisplaced fracture bilateral  inferior pubic ramus. Mild displaced fracture of the right  superior pubic ramus. Correlation with CT scan is recommended.   Assessment/Plan Active Problems:    1. Fracture of superior pubic ramus: Patient presented with increasing left hip /upper thigh pain, for the past 10 days, causing increasing difficulty in ambulation. Imaging studies demonstrated mild displaced fracture bilateral superior pubic rami, mild displaced fracture left inferior pubic ramus and probable nondisplaced fracture right inferior pubic ramus. There is no history of antecedent trauma or fall, and no radiographic evidence of lytic lesions. The culprit is therefore,likely to be osteopenia. Dr Otelia Sergeant provided orthopedic surgical consultation, and has recommended conservative management with analgesics, PT/OT, weight-bearing as tolerated, calcium/Vit D supplements and bisphosphonates. We shall check vitamin D levels.  2. Dehydration/Hyponatremia: Patient has a creatinine of 1.38, BUN 24, ie, an elevated BUN/Creatinine ratio, as well as a hyponatremia of 121. cosistent with dehydration an volume depletion, probably due to inadequate oral fluid intake and continuing diuretic therapy. Baseline creatinine was 0.85-1.23 in 04/2012. Will discontinue Lasix, and manage with gentle iv fluids.  3. Bilateral leg cellulitis: Patient was diagnosed with bilateral LE cellulitis on 09/27/12, and has completed a 10-day course of Clindamycin and Bactrim. Clinically, cellulitis has practically resolved, with marked improvement of inflammatory phenomena and resolution of  tenderness. Patient is afebrile, and wcc is normal. Will not continue antibiotics. Elevate legs, as patient appears to have a component of venous insufficiency. Will do venous doppler to exclude interval DVT.  4. CAD (coronary artery disease): Stable/Asymptomatic.  5. Depression/Dementia: Patient has a history of depression and mild dementia. Will continue pre-admission psychotropics.  6. Constipation: Per patient and family, she has had no bowel movement in the past 5 days, despite various laxatives and suppositories. Will try magnesium citrate, followed by Miralax and prn enemas. Check abdominal X-ray.  7. HTN: BP appears reasonably controlled at this time.  8. Hypothyroidism: Will continue thyroxine replacement therapy and check TSH.   Further management will depend on clinical course.   Comment: Have discussed with patient's HPOA/Husband, and he has confirmed that patient is FULL CODE.   Note: Spouse is main contact; Tel: [phone].   Time Spent on Admission: 1 hour.   Jasn Xia,CHRISTOPHER 10/10/2012, 4:37 PM

## 2012-10-10 NOTE — Progress Notes (Signed)
Please note that pt's daughter thinks that pt may need her anxiety medication (Xanax) increased, as she states that pt's anxiety level has been high as of late, and also with the pain. Pt's daughter states that pt has been taking current dose of Xanax for the past several years. Please advise. Thank you.

## 2012-10-10 NOTE — Progress Notes (Addendum)
Clinical Social Work Department CLINICAL SOCIAL WORK PLACEMENT NOTE 10/10/2012  Patient:  Jenny [name], Jenny [name]  Account Number:  [id number] Admit date:  10/10/2012  Clinical Social Worker:  Doree Albee  Date/time:  10/10/2012 04:27 PM  Clinical Social Work is seeking post-discharge placement for this patient at the following level of care:   SKILLED NURSING   (*CSW will update this form in Epic as items are completed)   10/10/2012  Patient/family provided with [facility] Department of Clinical Social Work's list of facilities offering this level of care within the geographic area requested by the patient (or if unable, by the patient's family).  10/10/2012  Patient/family informed of their freedom to choose among providers that offer the needed level of care, that participate in Medicare, Medicaid or managed care program needed by the patient, have an available bed and are willing to accept the patient.  10/10/2012  Patient/family informed of [facility]' ownership interest in [facility], as well as of the fact that they are under no obligation to receive care at this facility.  PASARR submitted to EDS on  PASARR number received from EDS on   FL2 transmitted to all facilities in geographic area requested by pt/family on  10/10/2012 FL2 transmitted to all facilities within larger geographic area on   Patient informed that his/her managed care company has contracts with or will negotiate with  certain facilities, including the following:     Patient/family informed of bed offers received:  10/10/2012 Patient chooses bed at [facility] AT [facility] Physician recommends and patient chooses bed at    Patient to be transferred to [facility] on   Patient to be transferred to facility by   The following physician request were entered in Epic:   Additional Comments: Per Colen Darling patient will have bed at [facility] when pt is medically  discharged to skilled nursing.

## 2012-10-10 NOTE — Progress Notes (Signed)
Please call daughters FIRST if needs arise for pt.

## 2012-10-10 NOTE — ED Provider Notes (Signed)
Medical screening examination/treatment/procedure(s) were conducted as a shared visit with non-physician practitioner(s) and myself.  I personally evaluated the patient during the encounter  Pt with worsening extremity pain.  She has been seeing a [facility].  Over the last week worse.  Will check xrays, treat pain and see if patient is able to ambulate better.  No recent falls  On exam, pain with range of motion left hip.  Bilateral pedal edama.  Family reports recent doppler test as an outpatient.   Plan on labs, xrays and further treatment.  --  Tests results reviewed.  Pt with multiple pelvic fractures.  Will admit for pain management, further treatment, investigation as to the etiology  Celene Kras, MD 10/10/12 1545

## 2012-10-10 NOTE — ED Notes (Signed)
Bed:WA21<BR> Expected date:<BR> Expected time:<BR> Means of arrival:<BR> Comments:<BR> PTAR

## 2012-10-10 NOTE — Consult Note (Addendum)
Reason for Consult:Groin and hip pain. Referring Physician: Ricke Hey, MD Consulting Physician:NITKA,JAMES E  Orthopedic Diagnosis:1)Bilateral superior pubic rami and right inferior pubic ramus fractures, insufficiency vs. Pathologic. 2)Bilateral lower extremity venous stasis with mild cellulitis. 3)Right total hip replacement 04/2012 for osteoarthritis, stable.  Jenny [name] is an 78 y.o. female.with 5 day history of worsening left hip and groin pain. Today so severe that she  Has stopped walking and family had her transported to [facility] ER for evaluation. No history of trauma or fall. No history of osteoporosis. She lives with her husband at [facility] [place]. Difficulty voiding due to pain.History of back pain with spinal stenosis being treated Conservatively with chiropractic therapy, non manipulative.    Past Medical History  Diagnosis Date  . Hypertension   . Coronary artery disease   . Heart murmur   . Hypothyroidism 05-02-12    tx. Synthroid  . Depression 05-02-12     depression only,tx. oral meds  . Headache 05-02-12    past hx. migrianes-many yrs ago  . Arthritis 05-02-12    osteoarthritis-rt. hip  . Pneumonia 05-02-12    mild case    Past Surgical History  Procedure Date  . Tonsillectomy   . Tubal ligation 05-02-12  .  tummy tuck 05-02-12    many yrs ago  . Cataract extraction, bilateral 05-02-12    bilateral  . Retinal detachment surgery 05-02-12    left  . Total hip arthroplasty 05/09/2012    Procedure: TOTAL HIP ARTHROPLASTY ANTERIOR APPROACH;  Surgeon: Kathryne Hitch, MD;  Location: [facility];  Service: Orthopedics;  Laterality: Right;    Family History  Problem Relation Age of Onset  . Diabetes Father     Social History:  reports that she quit smoking about 4 years ago. She has never used smokeless tobacco. She reports that she does not drink alcohol or use illicit drugs.  Allergies:  Allergies  Allergen Reactions  . Penicillins Rash     Medications:  Prior to Admission:  Prescriptions prior to admission  Medication Sig Dispense Refill  . acetaminophen (TYLENOL) 325 MG tablet Take 650 mg by mouth 3 (three) times daily.       Marland Kitchen ALPRAZolam (XANAX) 0.25 MG tablet Take 0.25 mg by mouth 3 (three) times daily.       . ARIPiprazole (ABILIFY) 5 MG tablet Take 5 mg by mouth at bedtime.      . Ascorbic Acid (VITAMIN C) 1000 MG tablet Take 1,000 mg by mouth daily with breakfast.      . aspirin EC 81 MG tablet Take 81 mg by mouth daily.      . brimonidine-timolol (COMBIGAN) 0.2-0.5 % ophthalmic solution Place 1 drop into both eyes every 12 (twelve) hours.        . cycloSPORINE (RESTASIS) 0.05 % ophthalmic emulsion Place 1 drop into both eyes 2 (two) times daily. In both eyes      . furosemide (LASIX) 40 MG tablet Take 80 mg by mouth daily with breakfast. To help with swelling      . lamoTRIgine (LAMICTAL) 150 MG tablet Take 150 mg by mouth at bedtime.       Marland Kitchen levothyroxine (SYNTHROID, LEVOTHROID) 88 MCG tablet Take 88 mcg by mouth daily with breakfast. For thyroid      . magnesium hydroxide (MILK OF MAGNESIA) 400 MG/5ML suspension Take 30 mLs by mouth daily as needed. For constipation      . naproxen sodium (ANAPROX) 220 MG tablet  Take 220 mg by mouth 2 (two) times daily.      . nebivolol (BYSTOLIC) 10 MG tablet Take 10 mg by mouth daily with breakfast. To control blood pressure      . omeprazole (PRILOSEC) 20 MG capsule Take 20 mg by mouth daily.      Marland Kitchen oxycodone (OXY-IR) 5 MG capsule Take 10 mg by mouth every 6 (six) hours as needed. For pain      . polyethylene glycol (MIRALAX / GLYCOLAX) packet Take 17 g by mouth daily.      Marland Kitchen senna (SENOKOT) 8.6 MG TABS Take 2 tablets by mouth at bedtime.      . sertraline (ZOLOFT) 100 MG tablet Take 100 mg by mouth daily.      . zaleplon (SONATA) 10 MG capsule Take 10 mg by mouth at bedtime as needed. For insomnia      . clindamycin (CLEOCIN) 150 MG capsule Take 1 capsule (150 mg total) by  mouth 4 (four) times daily.  40 capsule  0  . sulfamethoxazole-trimethoprim (BACTRIM DS,SEPTRA DS) 800-160 MG per tablet Take 1 tablet by mouth 2 (two) times daily.  20 tablet  0    Results for orders placed during the hospital encounter of 10/10/12 (from the past 48 hour(s))  CBC WITH DIFFERENTIAL     Status: Abnormal   Collection Time   10/10/12 11:36 AM      Component Value Range Comment   WBC 8.2  4.0 - 10.5 K/uL    RBC 3.70 (*) 3.87 - 5.11 MIL/uL    Hemoglobin 11.0 (*) 12.0 - 15.0 g/dL    HCT 16.1 (*) 09.6 - 46.0 %    MCV 87.0  78.0 - 100.0 fL    MCH 29.7  26.0 - 34.0 pg    MCHC 34.2  30.0 - 36.0 g/dL    RDW 04.5  40.9 - 81.1 %    Platelets 405 (*) 150 - 400 K/uL    Neutrophils Relative 69  43 - 77 %    Lymphocytes Relative 13  12 - 46 %    Monocytes Relative 13 (*) 3 - 12 %    Eosinophils Relative 4  0 - 5 %    Basophils Relative 1  0 - 1 %    Neutro Abs 5.6  1.7 - 7.7 K/uL    Lymphs Abs 1.1  0.7 - 4.0 K/uL    Monocytes Absolute 1.1 (*) 0.1 - 1.0 K/uL    Eosinophils Absolute 0.3  0.0 - 0.7 K/uL    Basophils Absolute 0.1  0.0 - 0.1 K/uL    WBC Morphology MILD LEFT SHIFT (1-5% METAS, OCC MYELO, OCC BANDS)   ATYPICAL LYMPHOCYTES  BASIC METABOLIC PANEL     Status: Abnormal   Collection Time   10/10/12 11:36 AM      Component Value Range Comment   Sodium 121 (*) 135 - 145 mEq/L    Potassium 5.1  3.5 - 5.1 mEq/L    Chloride 86 (*) 96 - 112 mEq/L    CO2 24  19 - 32 mEq/L    Glucose, Bld 125 (*) 70 - 99 mg/dL    BUN 24 (*) 6 - 23 mg/dL    Creatinine, Ser 9.14 (*) 0.50 - 1.10 mg/dL    Calcium 8.8  8.4 - 78.2 mg/dL    GFR calc non Af Amer 35 (*) >90 mL/min    GFR calc Af Amer 40 (*) >90 mL/min  Dg Lumbar Spine Complete  10/10/2012  *RADIOLOGY REPORT*  Clinical Data: Back pain, left hip pain  LUMBAR SPINE - COMPLETE 4+ VIEW  Comparison: 11/28/2011  Findings: Five views of the lumbar spine submitted.  Diffuse osteopenia.  Multilevel disc space flattening with vacuum disc  phenomenon and anterior spurring.  No acute fracture or subluxation.  Atherosclerotic calcifications of the abdominal aorta and the iliac arteries are noted.  There is right hip prosthesis anatomic alignment.  Mild displaced fracture or bilateral superior pubic ramus.  Probable nondisplaced fracture of the right inferior pubic ramus.  Mild displaced fracture of the left inferior pubic ramus.  IMPRESSION: No lumbar spine acute fracture or subluxation.  Diffuse osteopenia. Multilevel degenerative changes.  Mild displaced fracture bilateral superior pubic ramus.  Mild displaced fracture left inferior pubic ramus.  Probable nondisplaced fracture right inferior pubic ramus.   Original Report Authenticated By: Natasha Mead, M.D.    Dg Hip Complete Left  10/10/2012  *RADIOLOGY REPORT*  Clinical Data: Hip pain  LEFT HIP - COMPLETE 2+ VIEW  Comparison: 02/20/2012  Findings: Three views of the left hip submitted.  Mild spurring of the left humeral head.  There is mild displaced fracture of the left superior pubic ramus at the level of the superior acetabulum. Probable nondisplaced fracture or bilateral inferior pubic ramus. Mild displaced fracture of the right superior pubic ramus.  Right hip prosthesis in anatomic alignment.  IMPRESSION: No hip dislocation.  Spurring of the left femoral head.  Mild displaced fracture of the left superior pubic ramus at the level of the left acetabulum.  Probable nondisplaced fracture bilateral inferior pubic ramus.  Mild displaced fracture of the right superior pubic ramus.  Correlation with CT scan is recommended.   Original Report Authenticated By: Natasha Mead, M.D.    Dg Femur Left  10/10/2012  *RADIOLOGY REPORT*  Clinical Data: Hip pain  LEFT FEMUR - 2 VIEW  Comparison: Left hip same day.  Findings: Two views of the left femur submitted.  No acute fracture or subluxation.  Mild spurring of femoral head.  Mild degenerative changes left knee joint.  IMPRESSION: No acute fracture or  subluxation.  Mild spurring of femoral head. Mild degenerative changes left knee joint.   Original Report Authenticated By: Natasha Mead, M.D.      Blood pressure 172/53, pulse 63, temperature 97.5 F (36.4 C), temperature source Oral, resp. rate 19, SpO2 97.00%.  Orthopaedic Exam: Pleasant 78 year old female in mild distress, O2 pnc in place. On exam table in a semireclined Position.Points to the left groin and the left thigh as the area of most severe pain. Lateral pelvis compression test positive for left replicating her pain left > right groin pain.Legs are NV Normal. She has bilateral lower extremity erythrema and edema over the anterior legs distal 2/3s. Motor both legs intact. Good capillary refill. There is mild warmth both lower Extremities knee down. Xrays as above.  Assessment/Plan: Bilateral pubic rami fractures without History of trauma suggest insufficiency fractures due to  osteoporosis versus pathologic fractures. Bilateral lower extremity edema warm and eyrthrema consistent with venous stasis and cellulitis.  Plan:Decreased activity, bedrest today with gradual PT Assisted mobilization. Needs work up  For osteoporosis Bone Density when more mobile in 4-6 weeks. Check Vitamin D, calcium and phosphate levels. Should have  Serum or urine protein immunophoresis IFE to rule out multiple  myeloma.Anticoagulation to prevent DVT.Skilled nursing Home placement, hopefully at [facility] of the facility.  NITKA,JAMES E  10/10/2012, 4:06 PM

## 2012-10-10 NOTE — ED Notes (Signed)
Pt states last BM was 4 days ago.

## 2012-10-10 NOTE — Progress Notes (Signed)
Clinical Social Work Department BRIEF PSYCHOSOCIAL ASSESSMENT 10/10/2012  Patient:  Jenny [name], Jenny [name]     Account Number:  [id number]     Admit date:  10/10/2012  Clinical Social Worker:  Doree Albee  Date/Time:  10/10/2012 04:00 PM  Referred by:  RN  Date Referred:  10/10/2012 Referred for  SNF Placement   Other Referral:   Interview type:  Patient Other interview type:   and patient family    PSYCHOSOCIAL DATA Living Status:  FACILITY Admitted from facility:  [facility] Level of care:  Assisted Living Primary support name:  Melyssa Signor Primary support relationship to patient:  SPOUSE Degree of support available:   strong    Patient daughters Delray Alt and Harriett Sine also present, and are listed as secondary contacts to pt spouse.    CURRENT CONCERNS Current Concerns  Post-Acute Placement   Other Concerns:    SOCIAL WORK ASSESSMENT / PLAN CSW met with pt and pt family at bedside. Pt currently being transferred to inpatient floor. Pt family shared that she is a resident at [facility] with pt spouse. Pt recently had been in ALF for the past week however due to pelvic fractures pt is anticipated to need more assistance at time of discharge. Pt and pt family are looking for patient to discharged to skilled at [facility]. CSW confirmed with facility that patient will be able to discharge to skilled nursing at [facility], pt bed is being held.    This CSW will initiate process of patient discharging to skilled nursing at [facility]. Please see placement note for placement progress.   Assessment/plan status:  Psychosocial Support/Ongoing Assessment of Needs Other assessment/ plan:   Information/referral to community resources:   [facility]    PATIENT'S/FAMILY'S RESPONSE TO PLAN OF CARE: Pt and pt family thanked CSW for concern and support. Pt family plan to follow up wtih unit CSW.

## 2012-10-10 NOTE — ED Provider Notes (Signed)
History     CSN: [id number]  Arrival date & time 10/10/12  1056   First MD Initiated Contact with Patient 10/10/12 1100      Chief Complaint  Patient presents with  . Hip Pain    (Consider location/radiation/quality/duration/timing/severity/associated sxs/prior treatment) HPI Comments: Patient with history of right hip replacement in 04/2012 -- presents with worsening left hip and thigh pain. Patient states that the pain has been present but mild since her hip replacement. For the past week the pain has become worse and is worse with walking. Patient ambulates at baseline with a walker. She has swelling in her legs that it is not worse than usual. She denies history of claudication. She denies any recent injuries or falls where she injured her hip. She denies numbness, weakness, or tingling in her lower extremities bilaterally. No treatments prior to arrival. No h/o blood clots. Patient denies chest pain or shortness of breath. She denies fever or skin changes. Onset gradual. Course is gradually worsening. Nothing makes symptoms better.  The history is provided by the patient and medical records.    Past Medical History  Diagnosis Date  . Hypertension   . Coronary artery disease   . Heart murmur   . Hypothyroidism 05-02-12    tx. Synthroid  . Depression 05-02-12     depression only,tx. oral meds  . Headache 05-02-12    past hx. migrianes-many yrs ago  . Arthritis 05-02-12    osteoarthritis-rt. hip  . Pneumonia 05-02-12    mild case    Past Surgical History  Procedure Date  . Tonsillectomy   . Tubal ligation 05-02-12  .  tummy tuck 05-02-12    many yrs ago  . Cataract extraction, bilateral 05-02-12    bilateral  . Retinal detachment surgery 05-02-12    left  . Total hip arthroplasty 05/09/2012    Procedure: TOTAL HIP ARTHROPLASTY ANTERIOR APPROACH;  Surgeon: Kathryne Hitch, MD;  Location: [facility];  Service: Orthopedics;  Laterality: Right;    Family History  Problem  Relation Age of Onset  . Diabetes Father     History  Substance Use Topics  . Smoking status: Former Games developer  . Smokeless tobacco: Not on file  . Alcohol Use: No    OB History    Grav Para Term Preterm Abortions TAB SAB Ect Mult Living                  Review of Systems  Constitutional: Negative for fever.  HENT: Negative for sore throat and rhinorrhea.   Eyes: Negative for redness.  Respiratory: Positive for shortness of breath (mild at baseline). Negative for cough.   Cardiovascular: Positive for leg swelling. Negative for chest pain.  Gastrointestinal: Negative for nausea, vomiting, abdominal pain and diarrhea.  Genitourinary: Negative for dysuria.  Musculoskeletal: Positive for arthralgias. Negative for myalgias.  Skin: Negative for color change and rash.  Neurological: Negative for weakness, numbness and headaches.    Allergies  Penicillins  Home Medications   Current Outpatient Rx  Name  Route  Sig  Dispense  Refill  . ACETAMINOPHEN 325 MG PO TABS   Oral   Take 650 mg by mouth 3 (three) times daily.          Marland Kitchen ALPRAZOLAM 0.25 MG PO TABS   Oral   Take 0.25 mg by mouth 3 (three) times daily.          . ARIPIPRAZOLE 5 MG PO TABS   Oral  Take 5 mg by mouth at bedtime.         Marland Kitchen VITAMIN C 1000 MG PO TABS   Oral   Take 1,000 mg by mouth daily with breakfast.         . ASPIRIN EC 81 MG PO TBEC   Oral   Take 81 mg by mouth daily.         Marland Kitchen BRIMONIDINE TARTRATE-TIMOLOL 0.2-0.5 % OP SOLN   Both Eyes   Place 1 drop into both eyes every 12 (twelve) hours.           . CYCLOSPORINE 0.05 % OP EMUL   Both Eyes   Place 1 drop into both eyes 2 (two) times daily. In both eyes         . FUROSEMIDE 40 MG PO TABS   Oral   Take 80 mg by mouth daily with breakfast. To help with swelling         . LAMOTRIGINE 150 MG PO TABS   Oral   Take 150 mg by mouth at bedtime.          Marland Kitchen LEVOTHYROXINE SODIUM 88 MCG PO TABS   Oral   Take 88 mcg by mouth  daily with breakfast. For thyroid         . MAGNESIUM HYDROXIDE 400 MG/5ML PO SUSP   Oral   Take 30 mLs by mouth daily as needed. For constipation         . NAPROXEN SODIUM 220 MG PO TABS   Oral   Take 220 mg by mouth 2 (two) times daily.         . NEBIVOLOL HCL 10 MG PO TABS   Oral   Take 10 mg by mouth daily with breakfast. To control blood pressure         . OMEPRAZOLE 20 MG PO CPDR   Oral   Take 20 mg by mouth daily.         . OXYCODONE HCL 5 MG PO CAPS   Oral   Take 10 mg by mouth every 6 (six) hours as needed. For pain         . POLYETHYLENE GLYCOL 3350 PO PACK   Oral   Take 17 g by mouth daily.         . SENNA 8.6 MG PO TABS   Oral   Take 2 tablets by mouth at bedtime.         . SERTRALINE HCL 100 MG PO TABS   Oral   Take 100 mg by mouth daily.         Marland Kitchen ZALEPLON 10 MG PO CAPS   Oral   Take 10 mg by mouth at bedtime as needed. For insomnia         . CLINDAMYCIN HCL 150 MG PO CAPS   Oral   Take 1 capsule (150 mg total) by mouth 4 (four) times daily.   40 capsule   0   . SULFAMETHOXAZOLE-TRIMETHOPRIM 800-160 MG PO TABS   Oral   Take 1 tablet by mouth 2 (two) times daily.   20 tablet   0     BP 172/53  Pulse 65  Temp 97.5 F (36.4 C) (Oral)  Resp 10  SpO2 100%  Physical Exam  Nursing note and vitals reviewed. Constitutional: She appears well-developed and well-nourished.  HENT:  Head: Normocephalic and atraumatic.  Eyes: Conjunctivae normal are normal. Right eye exhibits no discharge. Left eye exhibits no discharge.  Neck: Normal range of motion. Neck supple.  Cardiovascular: Normal rate, regular rhythm and normal heart sounds.   Pulmonary/Chest: Effort normal and breath sounds normal.  Abdominal: Soft. There is no tenderness.  Musculoskeletal:       Left hip: She exhibits tenderness. She exhibits no bony tenderness and no swelling.       Left knee: no tenderness found.       Right upper leg: She exhibits tenderness. She  exhibits no bony tenderness.       Left upper leg: She exhibits no tenderness and no swelling.       Right lower leg: She exhibits edema (2+ pitting edema).       Left lower leg: She exhibits edema (2+ pitting edema).       L anterior thigh pain, L hip pain, worse with movement. Pedal pulses not palpated but easily found with doppler US bilaterally. No rashes. Feet cool but not cyanotic.   Neurological: She is alert.  Skin: Skin is warm and dry.  Psychiatric: She has a normal mood and affect.    ED Course  Procedures (including critical care time)  Labs Reviewed  CBC WITH DIFFERENTIAL - Abnormal; Notable for the following:    RBC 3.70 (*)     Hemoglobin 11.0 (*)     HCT 32.2 (*)     Platelets 405 (*)     Monocytes Relative 13 (*)     Monocytes Absolute 1.1 (*)     All other components within normal limits  BASIC METABOLIC PANEL - Abnormal; Notable for the following:    Sodium 121 (*)     Chloride 86 (*)     Glucose, Bld 125 (*)     BUN 24 (*)     Creatinine, Ser 1.38 (*)     GFR calc non Af Amer 35 (*)     GFR calc Af Amer 40 (*)     All other components within normal limits  PATHOLOGIST SMEAR REVIEW   Dg Lumbar Spine Complete  10/10/2012  *RADIOLOGY REPORT*  Clinical Data: Back pain, left hip pain  LUMBAR SPINE - COMPLETE 4+ VIEW  Comparison: 11/28/2011  Findings: Five views of the lumbar spine submitted.  Diffuse osteopenia.  Multilevel disc space flattening with vacuum disc phenomenon and anterior spurring.  No acute fracture or subluxation.  Atherosclerotic calcifications of the abdominal aorta and the iliac arteries are noted.  There is right hip prosthesis anatomic alignment.  Mild displaced fracture or bilateral superior pubic ramus.  Probable nondisplaced fracture of the right inferior pubic ramus.  Mild displaced fracture of the left inferior pubic ramus.  IMPRESSION: No lumbar spine acute fracture or subluxation.  Diffuse osteopenia. Multilevel degenerative changes.  Mild  displaced fracture bilateral superior pubic ramus.  Mild displaced fracture left inferior pubic ramus.  Probable nondisplaced fracture right inferior pubic ramus.   Original Report Authenticated By: Natasha Mead, M.D.    Dg Hip Complete Left  10/10/2012  *RADIOLOGY REPORT*  Clinical Data: Hip pain  LEFT HIP - COMPLETE 2+ VIEW  Comparison: 02/20/2012  Findings: Three views of the left hip submitted.  Mild spurring of the left humeral head.  There is mild displaced fracture of the left superior pubic ramus at the level of the superior acetabulum. Probable nondisplaced fracture or bilateral inferior pubic ramus. Mild displaced fracture of the right superior pubic ramus.  Right hip prosthesis in anatomic alignment.  IMPRESSION: No hip dislocation.  Spurring of the left femoral head.  Mild displaced fracture of the left superior pubic ramus at the level of the left acetabulum.  Probable nondisplaced fracture bilateral inferior pubic ramus.  Mild displaced fracture of the right superior pubic ramus.  Correlation with CT scan is recommended.   Original Report Authenticated By: Natasha Mead, M.D.    Dg Femur Left  10/10/2012  *RADIOLOGY REPORT*  Clinical Data: Hip pain  LEFT FEMUR - 2 VIEW  Comparison: Left hip same day.  Findings: Two views of the left femur submitted.  No acute fracture or subluxation.  Mild spurring of femoral head.  Mild degenerative changes left knee joint.  IMPRESSION: No acute fracture or subluxation.  Mild spurring of femoral head. Mild degenerative changes left knee joint.   Original Report Authenticated By: Natasha Mead, M.D.      1. Multiple pelvic fractures   2. Hyponatremia     11:19 AM Patient seen and examined. Work-up initiated. Medications ordered.   Vital signs reviewed and are as follows: Filed Vitals:   10/10/12 1111  BP: 172/53  Pulse: 65  Temp: 97.5 F (36.4 C)  Resp: 10   11:32 AM D/w Dr. Lynelle Doctor.   12:23 PM Spoke with family. Dr. Magnus Ivan has been in to see. Patient  had neg DVT study 11/19. Given symptoms present at that time (although less severe) will cancel this. Lumbar films added per request of Dr. Magnus Ivan. Will continue to monitor.   1:34 PM X-ray reviewed by myself. Radiologist report reviewed. D/w Dr. Lynelle Doctor. Will call ortho and then for admission.   2:24 PM Spoke with Dr. Otelia Sergeant who will see and consult. Awaiting call from hospitalist.   2:34 PM [facility] 3.  MDM  Multiple pelvic fractures, hyponatremia        Renne Crigler, [place] 10/10/12 1434

## 2012-10-11 DIAGNOSIS — M79609 Pain in unspecified limb: Secondary | ICD-10-CM

## 2012-10-11 DIAGNOSIS — N179 Acute kidney failure, unspecified: Secondary | ICD-10-CM

## 2012-10-11 DIAGNOSIS — M7989 Other specified soft tissue disorders: Secondary | ICD-10-CM

## 2012-10-11 LAB — CBC
MCH: 29.4 pg (ref 26.0–34.0)
MCV: 89.6 fL (ref 78.0–100.0)
Platelets: 390 10*3/uL (ref 150–400)
RDW: 13.8 % (ref 11.5–15.5)

## 2012-10-11 LAB — PROTIME-INR: INR: 1.07 (ref 0.00–1.49)

## 2012-10-11 LAB — COMPREHENSIVE METABOLIC PANEL
AST: 20 U/L (ref 0–37)
Albumin: 2.8 g/dL — ABNORMAL LOW (ref 3.5–5.2)
CO2: 27 mEq/L (ref 19–32)
Calcium: 8.2 mg/dL — ABNORMAL LOW (ref 8.4–10.5)
Creatinine, Ser: 1.45 mg/dL — ABNORMAL HIGH (ref 0.50–1.10)
GFR calc non Af Amer: 33 mL/min — ABNORMAL LOW (ref >90)

## 2012-10-11 LAB — TSH: TSH: 2.337 u[IU]/mL (ref 0.350–4.500)

## 2012-10-11 MED ORDER — SODIUM CHLORIDE 0.9 % IV BOLUS (SEPSIS)
500.0000 mL | Freq: Once | INTRAVENOUS | Status: AC
  Administered 2012-10-11: 500 mL via INTRAVENOUS

## 2012-10-11 MED ORDER — VITAMINS A & D EX OINT
TOPICAL_OINTMENT | CUTANEOUS | Status: AC
  Administered 2012-10-11: 18:00:00
  Filled 2012-10-11: qty 5

## 2012-10-11 MED ORDER — DOXYCYCLINE HYCLATE 100 MG IV SOLR
100.0000 mg | Freq: Two times a day (BID) | INTRAVENOUS | Status: DC
  Administered 2012-10-11 – 2012-10-13 (×5): 100 mg via INTRAVENOUS
  Filled 2012-10-11 (×6): qty 100

## 2012-10-11 MED ORDER — SODIUM CHLORIDE 0.9 % IV SOLN
INTRAVENOUS | Status: AC
  Administered 2012-10-11 (×2): 100 mL/h via INTRAVENOUS

## 2012-10-11 MED ORDER — WARFARIN SODIUM 2.5 MG PO TABS
2.5000 mg | ORAL_TABLET | Freq: Once | ORAL | Status: AC
  Administered 2012-10-11: 2.5 mg via ORAL
  Filled 2012-10-11: qty 1

## 2012-10-11 MED ORDER — POLYETHYLENE GLYCOL 3350 17 G PO PACK
17.0000 g | PACK | Freq: Every day | ORAL | Status: DC
  Administered 2012-10-11 – 2012-10-13 (×2): 17 g via ORAL
  Filled 2012-10-11 (×3): qty 1

## 2012-10-11 NOTE — Progress Notes (Signed)
[facility] PROGRESS NOTE Assessment/Plan: Fracture of superior pubic ramus (10/10/2012) - No history of antecedent trauma or fall, and no radiographic evidence of lytic lesions.  -  Dr Otelia Sergeant orthopedic, recommended conservative management with analgesics, PT/OT, weight-bearing as tolerated, calcium/Vit D supplements and bisphosphonates.  - TSH WNL, Vot level pending  AKI: - baseline cr 0.8. - Bolus NS then continue for 24 hrs. - check B-met in am.  CAD (coronary artery disease) (10/10/2012) - stable, monitor.  Bilateral lower leg cellulitis (10/10/2012) - 09/27/12, and has completed a 10-day course of Clindamycin and Bactrim. - avoid bactrim due to renal function. Stop clindamycin. Start doxy.  Depression (10/10/2012)/Dementia (10/10/2012) -continue pre-admission psychotropics.  Constipation (10/10/2012): - miralax. - Senakot.  Code Status: FULL CODE Family Communication: Spouse is main contact; Tel: [phone].  Disposition Plan: SNF 12.2   Consultants:  ortho  Procedures:  none  Antibiotics: None  HPI/Subjective: No pain while at rest. Just with movement  Objective: Filed Vitals:   10/10/12 1545 10/10/12 1640 10/10/12 2246 10/11/12 0445  BP:  119/44 109/48 115/50  Pulse: 63 48 65 65  Temp:  98 F (36.7 C) 98 F (36.7 C) 98.1 F (36.7 C)  TempSrc:  Oral Oral Oral  Resp: 19 18 18 18   Height:  5\' 2"  (1.575 m)    Weight:  71.7 kg (158 lb 1.1 oz)    SpO2: 97% 98% 94% 96%    Intake/Output Summary (Last 24 hours) at 10/11/12 0805 Last data filed at 10/11/12 0700  Gross per 24 hour  Intake  957.5 ml  Output   1400 ml  Net -442.5 ml   Filed Weights   10/10/12 1640  Weight: 71.7 kg (158 lb 1.1 oz)    Exam:  General: Alert, awake, oriented x3, in no acute distress.  HEENT: No bruits, no goiter. No JVD. Heart: Regular rate and rhythm, without murmurs, rubs, gallops.  Lungs: Good air movement, clear to auscultation. Abdomen: Soft,  nontender, nondistended, positive bowel sounds.  Neuro: refused 2/2 to pain   Data Reviewed: Basic Metabolic Panel:  Lab 10/11/12 9562 10/10/12 1136  NA 125* 121*  K 5.1 5.1  CL 91* 86*  CO2 27 24  GLUCOSE 107* 125*  BUN 22 24*  CREATININE 1.45* 1.38*  CALCIUM 8.2* 8.8  MG -- --  PHOS -- --   Liver Function Tests:  Lab 10/11/12 0518  AST 20  ALT 17  ALKPHOS 134*  BILITOT 0.2*  PROT 5.3*  ALBUMIN 2.8*   No results found for this basename: LIPASE:5,AMYLASE:5 in the last 168 hours No results found for this basename: AMMONIA:5 in the last 168 hours CBC:  Lab 10/11/12 0518 10/10/12 1136  WBC 6.9 8.2  NEUTROABS -- 5.6  HGB 9.3* 11.0*  HCT 28.3* 32.2*  MCV 89.6 87.0  PLT 390 405*   Cardiac Enzymes: No results found for this basename: CKTOTAL:5,CKMB:5,CKMBINDEX:5,TROPONINI:5 in the last 168 hours BNP (last 3 results) No results found for this basename: PROBNP:3 in the last 8760 hours CBG: No results found for this basename: GLUCAP:5 in the last 168 hours  No results found for this or any previous visit (from the past 240 hour(s)).   Studies: Dg Lumbar Spine Complete  10/10/2012  *RADIOLOGY REPORT*  Clinical Data: Back pain, left hip pain  LUMBAR SPINE - COMPLETE 4+ VIEW  Comparison: 11/28/2011  Findings: Five views of the lumbar spine submitted.  Diffuse osteopenia.  Multilevel disc space flattening with vacuum disc phenomenon and anterior spurring.  No acute fracture or subluxation.  Atherosclerotic calcifications of the abdominal aorta and the iliac arteries are noted.  There is right hip prosthesis anatomic alignment.  Mild displaced fracture or bilateral superior pubic ramus.  Probable nondisplaced fracture of the right inferior pubic ramus.  Mild displaced fracture of the left inferior pubic ramus.  IMPRESSION: No lumbar spine acute fracture or subluxation.  Diffuse osteopenia. Multilevel degenerative changes.  Mild displaced fracture bilateral superior pubic ramus.   Mild displaced fracture left inferior pubic ramus.  Probable nondisplaced fracture right inferior pubic ramus.   Original Report Authenticated By: Natasha Mead, M.D.    Dg Hip Complete Left  10/10/2012  *RADIOLOGY REPORT*  Clinical Data: Hip pain  LEFT HIP - COMPLETE 2+ VIEW  Comparison: 02/20/2012  Findings: Three views of the left hip submitted.  Mild spurring of the left humeral head.  There is mild displaced fracture of the left superior pubic ramus at the level of the superior acetabulum. Probable nondisplaced fracture or bilateral inferior pubic ramus. Mild displaced fracture of the right superior pubic ramus.  Right hip prosthesis in anatomic alignment.  IMPRESSION: No hip dislocation.  Spurring of the left femoral head.  Mild displaced fracture of the left superior pubic ramus at the level of the left acetabulum.  Probable nondisplaced fracture bilateral inferior pubic ramus.  Mild displaced fracture of the right superior pubic ramus.  Correlation with CT scan is recommended.   Original Report Authenticated By: Natasha Mead, M.D.    Dg Femur Left  10/10/2012  *RADIOLOGY REPORT*  Clinical Data: Hip pain  LEFT FEMUR - 2 VIEW  Comparison: Left hip same day.  Findings: Two views of the left femur submitted.  No acute fracture or subluxation.  Mild spurring of femoral head.  Mild degenerative changes left knee joint.  IMPRESSION: No acute fracture or subluxation.  Mild spurring of femoral head. Mild degenerative changes left knee joint.   Original Report Authenticated By: Natasha Mead, M.D.    Abd 1 View (kub)  10/10/2012  *RADIOLOGY REPORT*  Clinical Data: Constipation for 4 days.  ABDOMEN - 1 VIEW  Comparison: Pelvic radiograph performed 05/09/2012, and lumbar spine radiographs performed earlier today at 12:39 p.m.  Findings: The visualized bowel gas pattern is unremarkable. Scattered air and stool filled loops of colon are seen; no abnormal dilatation of small bowel loops is seen to suggest small bowel  obstruction.  No free intra-abdominal air is identified, though evaluation for free air is limited on a single supine view.  Degenerative change is noted along the lumbar spine, with multilevel vacuum phenomenon and sclerosis.  Mild sclerotic change is noted at the sacroiliac joints.  There is diffuse lucency noted within the ischium surrounding the patient's right hip arthroplasty, raising concern for focal osteolysis as a result of particle disease; this appears to have progressed slightly since the prior pelvic radiograph.  Mild degenerative superior narrowing is noted at the left hip joint. The visualized lung bases are essentially clear.  IMPRESSION:  1.  Unremarkable bowel gas pattern; no free intra-abdominal air seen. 2.  Diffuse lucency within the ischium overlying the patient's right hip arthroplasty, raising concern for focal osteolysis as a result of particle disease.  This appears to have progressed slightly since the prior pelvic radiograph. 3.  Degenerative change noted along the lumbar spine.   Original Report Authenticated By: Tonia Ghent, M.D.     Scheduled Meds:   . acetaminophen  650 mg Oral TID  . ALPRAZolam  0.25  mg Oral TID  . ARIPiprazole  5 mg Oral QHS  . aspirin EC  81 mg Oral Daily  . brimonidine  1 drop Both Eyes Q12H   And  . timolol  1 drop Both Eyes Q12H  . calcitonin (salmon)  1 spray Alternating Nares Daily  . calcium-vitamin D  1 tablet Oral TID  . coumadin book   Does not apply Once  . cycloSPORINE  1 drop Both Eyes BID  . heparin  5,000 Units Subcutaneous Q8H  . lamoTRIgine  150 mg Oral QHS  . levothyroxine  88 mcg Oral QAC breakfast  . [COMPLETED] magnesium citrate  0.5 Bottle Oral Once  . [COMPLETED]  morphine injection  2 mg Intravenous Once  . [COMPLETED]  morphine injection  4 mg Intravenous Once  . [COMPLETED]  morphine injection  4 mg Intravenous Once  . nebivolol  10 mg Oral Q breakfast  . [COMPLETED] ondansetron  4 mg Intravenous Once  .  pantoprazole  40 mg Oral Daily  . polyethylene glycol  17 g Oral BID  . sertraline  100 mg Oral Daily  . sodium chloride  3 mL Intravenous Q12H  . sodium chloride  3 mL Intravenous Q12H  . vitamin C  1,000 mg Oral Daily  . [COMPLETED] warfarin  2.5 mg Oral Once  . warfarin  2.5 mg Oral ONCE-1800  . warfarin   Does not apply Once  . Warfarin - Pharmacist Dosing Inpatient   Does not apply q1800  . [DISCONTINUED] sodium chloride   Intravenous STAT  . [DISCONTINUED] brimonidine-timolol  1 drop Both Eyes Q12H   Continuous Infusions:   . sodium chloride 75 mL/hr at 10/10/12 1814  . [DISCONTINUED] sodium chloride 125 mL/hr at 10/10/12 1437     FELIZ Rosine Beat  [facility] Pager [phone].  If 8PM-8AM, please contact night-coverage at [email or web address], password [facility] 10/11/2012, 8:05 AM  LOS: 1 day

## 2012-10-11 NOTE — Progress Notes (Signed)
VASCULAR LAB PRELIMINARY  PRELIMINARY  PRELIMINARY  PRELIMINARY  Bilateral lower extremity venous Dopplers completed.    Preliminary report:  There is no DVT or SVT noted in the bilateral lower extremities.  Zarielle Cea, 10/11/2012, 9:55 AM

## 2012-10-11 NOTE — Evaluation (Signed)
Occupational Therapy Evaluation Patient Details Name: Jenny [name] MRN: [id number] DOB: 1927-10-07 Today's Date: 10/11/2012 Time: 4098-1191 OT Time Calculation (min): 23 min  OT Assessment / Plan / Recommendation Clinical Impression  This 78 year old female was admitted with bil fxs of superior and inferior public rami.  She has more pain on L side, near groin.  PMH is significant for HTN, CAD, CVA, spinal stenosis and anterior direct THA on R this past June. Per daughter, she has just finished a course of antibiotics for cellulitis.    Pt is appropiate for skilled OT to increase independence with adls & transfers to the 3:1 commode with min to mod A level goals in acute    OT Assessment  Patient needs continued OT Services    Follow Up Recommendations  SNF    Barriers to Discharge      Equipment Recommendations  None recommended by PT    Recommendations for Other Services    Frequency  Min 2X/week    Precautions / Restrictions Precautions Precaution Comments: bed to chair only Restrictions Weight Bearing Restrictions: No Other Position/Activity Restrictions: WBAT   Pertinent Vitals/Pain No pain at rest (premedicated).  Pain with weight bearing 7-8/10 L groin especially    ADL  Eating/Feeding: Simulated;Independent Where Assessed - Eating/Feeding: Chair Grooming: Simulated;Set up Where Assessed - Grooming: Unsupported sitting Upper Body Bathing: Simulated;Set up Where Assessed - Upper Body Bathing: Unsupported sitting Lower Body Bathing: Simulated;+2 Total assistance Lower Body Bathing: Patient Percentage: 60% Where Assessed - Lower Body Bathing: Supported sit to stand Upper Body Dressing: Simulated;Minimal assistance (lines) Where Assessed - Upper Body Dressing: Supported sit to stand Lower Body Dressing: Simulated;+2 Total assistance Lower Body Dressing: Patient Percentage: 20% Where Assessed - Lower Body Dressing: Supported sit to Pharmacist, hospital:  Chief of Staff: Patient Percentage: 50% Statistician Method: Surveyor, minerals:  (bed to chair, mod cues to decrease wt on L due to pain) Toileting - Clothing Manipulation and Hygiene: Simulated;+2 Total assistance Toileting - Clothing Manipulation and Hygiene: Patient Percentage: 40% Where Assessed - Toileting Clothing Manipulation and Hygiene: Sit to stand from 3-in-1 or toilet Equipment Used: Gait belt;Rolling walker Transfers/Ambulation Related to ADLs: SPT only to 3:1 chair.  Therapists used pad to sit pt at eob due to increased pain with movement ADL Comments: Pt did not use AE previously--had anterior direct approach to tha.  Did not introduce on this visit.  Pt is able to reach past mid calf comfortably but would benefit from AE to increase independence with adls.      OT Diagnosis: Generalized weakness  OT Problem List: Decreased activity tolerance;Pain;Decreased strength;Decreased knowledge of use of DME or AE OT Treatment Interventions: Self-care/ADL training;DME and/or AE instruction;Patient/family education   OT Goals Acute Rehab OT Goals OT Goal Formulation: With patient Time For Goal Achievement: 10/25/12 Potential to Achieve Goals: Good ADL Goals Pt Will Perform Lower Body Bathing: Sit to stand from chair;with adaptive equipment;with min assist ADL Goal: Lower Body Bathing - Progress: Goal set today Pt Will Perform Lower Body Dressing: with mod assist;Sit to stand from chair;with adaptive equipment ADL Goal: Lower Body Dressing - Progress: Goal set today Pt Will Transfer to Toilet: with min assist;Stand pivot transfer;3-in-1;with cueing (comment type and amount) (min cues) ADL Goal: Toilet Transfer - Progress: Goal set today Pt Will Perform Toileting - Hygiene: with min assist;Sit to stand from 3-in-1/toilet ADL Goal: Toileting - Hygiene - Progress: Goal set today Miscellaneous OT Goals  Miscellaneous OT Goal #1: Pt  will perform bed mobility with mod A in preparation for adls/toilet transfers OT Goal: Miscellaneous Goal #1 - Progress: Goal set today  Visit Information  Last OT Received On: 10/11/12 Assistance Needed: +2 PT/OT Co-Evaluation/Treatment: Yes    Subjective Data  Subjective: I don't have any pain right now. It hurts when I move Patient Stated Goal: get back to I living apt at [facility] eventually   Prior Functioning     Home Living Lives With: Spouse Available Help at Discharge: Skilled Nursing Facility Type of Home: Independent living facility Home Layout: One level Bathroom Shower/Tub: Engineer, manufacturing systems: Handicapped height Home Adaptive Equipment: Straight cane;Walker - four wheeled;Grab bars in shower;Grab bars around toilet Prior Function Level of Independence: Independent with assistive device(s) Driving: No Communication Communication: No difficulties         Vision/Perception     Cognition  Overall Cognitive Status: Appears within functional limits for tasks assessed/performed Arousal/Alertness: Awake/alert Orientation Level: Appears intact for tasks assessed Behavior During Session: [facility] for tasks performed    Extremity/Trunk Assessment Right Upper Extremity Assessment RUE ROM/Strength/Tone: Within functional levels Left Upper Extremity Assessment LUE ROM/Strength/Tone: Within functional levels  Trunk Assessment Trunk Assessment: Normal     Mobility Bed Mobility Bed Mobility: Supine to Sit Supine to Sit: 1: +2 Total assist Supine to Sit: Patient Percentage: 0% Details for Bed Mobility Assistance: therapists performed supine to sit to minimize pain with mobility Transfers Sit to Stand: 1: +2 Total assist;From bed Sit to Stand: Patient Percentage: 50% Stand to Sit: With upper extremity assist;To chair/3-in-1;1: +2 Total assist Stand to Sit: Patient Percentage: 50% Details for Transfer Assistance: VCs hand placement, assist to achieve  upright position.  Also cued to put LLE in front to decrease weight on this due to pain (she is wbat).       Shoulder Instructions     Exercise     Balance     End of Session OT - End of Session Activity Tolerance: Patient limited by pain Patient left: in chair;with call bell/phone within reach Nurse Communication: Mobility status  GO     Chevella Pearce 10/11/2012, 3:36 PM Marica Otter, OTR/L [phone] 10/11/2012

## 2012-10-11 NOTE — Evaluation (Signed)
Physical Therapy Evaluation Patient Details Name: Jenny [name] MRN: [id number] DOB: 1926-12-05 Today's Date: 10/11/2012 Time: 4098-1191 PT Time Calculation (min): 25 min  PT Assessment / Plan / Recommendation Clinical Impression  78 y.o. female with B superior pubic rami fracture and inferior rami fractures. Pt used cane and RW at independent living facility PTA. Pain is significantly limiting activity tolerance. +2 total assist for bed to chair with RW. SNF at [facility] recommended. Pt would benefit from acute PT to maximize safety and independence with mobiltiy.     PT Assessment  Patient needs continued PT services    Follow Up Recommendations  SNF    Does the patient have the potential to tolerate intense rehabilitation      Barriers to Discharge        Equipment Recommendations  None recommended by PT    Recommendations for Other Services OT consult   Frequency Min 3X/week    Precautions / Restrictions Precautions Precaution Comments: bed to chair only Restrictions Weight Bearing Restrictions: No Other Position/Activity Restrictions: WBAT   Pertinent Vitals/Pain **7/10 L pelvis with walking premedicated*      Mobility  Bed Mobility Bed Mobility: Supine to Sit Supine to Sit: 1: +2 Total assist Supine to Sit: Patient Percentage: 0% Details for Bed Mobility Assistance: therapists performed supine to sit to minimize pain with mobility Transfers Transfers: Sit to Stand;Stand to Sit Sit to Stand: 1: +2 Total assist;From bed Sit to Stand: Patient Percentage: 50% Stand to Sit: With upper extremity assist;To chair/3-in-1;1: +2 Total assist Stand to Sit: Patient Percentage: 50% Details for Transfer Assistance: VCs hand placement, assist to achieve upright position Ambulation/Gait Ambulation/Gait Assistance: 1: +2 Total assist Ambulation/Gait: Patient Percentage: 60% Ambulation Distance (Feet): 3 Feet Assistive device: Rolling walker Gait Pattern: Decreased  step length - left;Decreased stance time - right;Antalgic General Gait Details: Heavy use of BUEs with RW to unweight LLE 2* pain, assist to maneuver RW, VCs sequencing    Shoulder Instructions     Exercises     PT Diagnosis: Difficulty walking;Generalized weakness;Acute pain  PT Problem List: Decreased strength;Decreased range of motion;Decreased activity tolerance;Decreased mobility;Pain PT Treatment Interventions: Gait training;DME instruction;Functional mobility training;Therapeutic activities;Therapeutic exercise;Patient/family education   PT Goals Acute Rehab PT Goals PT Goal Formulation: With patient/family Time For Goal Achievement: 10/18/12 Potential to Achieve Goals: Fair Pt will go Supine/Side to Sit: with mod assist PT Goal: Supine/Side to Sit - Progress: Goal set today Pt will go Sit to Stand: with min assist PT Goal: Sit to Stand - Progress: Goal set today Pt will Transfer Bed to Chair/Chair to Bed: with min assist PT Transfer Goal: Bed to Chair/Chair to Bed - Progress: Goal set today  Visit Information  Last PT Received On: 10/11/12 Assistance Needed: +2    Subjective Data  Subjective: It doesn't hurt when I'm still, just when I move.  Patient Stated Goal: SNF at [facility] then Independent livign   Prior Functioning  Home Living Lives With: Spouse Available Help at Discharge: Skilled Nursing Facility Type of Home: Independent living facility ([facility]) Home Layout: One level Bathroom Shower/Tub: Nurse, adult Toilet: Handicapped height Home Adaptive Equipment: Straight cane;Walker - four wheeled;Grab bars in shower;Grab bars around toilet Prior Function Level of Independence: Independent with assistive device(s) Driving: No Communication Communication: No difficulties    Cognition  Overall Cognitive Status: Appears within functional limits for tasks assessed/performed Arousal/Alertness: Awake/alert Orientation Level: Appears  intact for tasks assessed Behavior During Session: [facility] for  tasks performed    Extremity/Trunk Assessment Right Upper Extremity Assessment RUE ROM/Strength/Tone: [facility] for tasks assessed Left Upper Extremity Assessment LUE ROM/Strength/Tone: WFL for tasks assessed Right Lower Extremity Assessment RLE ROM/Strength/Tone: Within functional levels RLE Sensation: WFL - Light Touch RLE Coordination: WFL - gross/fine motor Left Lower Extremity Assessment LLE ROM/Strength/Tone: Deficits;Due to pain LLE ROM/Strength/Tone Deficits: knee ext -20* AROM, limited by pain, ankle WNL, hip NT 2* pain LLE Sensation: WFL - Light Touch LLE Coordination: WFL - gross/fine motor Trunk Assessment Trunk Assessment: Normal   Balance    End of Session PT - End of Session Equipment Utilized During Treatment: Gait belt Activity Tolerance: Patient limited by pain Patient left: in chair;with call bell/phone within reach Nurse Communication: Mobility status  GP     Ralene Bathe Kistler 10/11/2012, 2:46 PM  [phone]

## 2012-10-11 NOTE — Progress Notes (Signed)
ANTICOAGULATION CONSULT NOTE - follow up  Pharmacy Consult for Warfarin Indication: VTE prophylaxis  Allergies  Allergen Reactions  . Penicillins Rash    Patient Measurements: Height: 5\' 2"  (157.5 cm) Weight: 158 lb 1.1 oz (71.7 kg) IBW/kg (Calculated) : 50.1    Vital Signs: Temp: 98.1 F (36.7 C) (11/30 0445) Temp src: Oral (11/30 0445) BP: 115/50 mmHg (11/30 0445) Pulse Rate: 65  (11/30 0445)  Labs:  Basename 10/11/12 0518 10/10/12 2027 10/10/12 1136  HGB 9.3* -- 11.0*  HCT 28.3* -- 32.2*  PLT 390 -- 405*  APTT -- -- --  LABPROT 13.8 13.3 --  INR 1.07 1.02 --  HEPARINUNFRC -- -- --  CREATININE 1.45* -- 1.38*  CKTOTAL -- -- --  CKMB -- -- --  TROPONINI -- -- --    Estimated Creatinine Clearance: 27.7 ml/min (by C-G formula based on Cr of 1.45).  Assessment:  78 yo female with bilateral pubic rami fractures without h/o trauma  Warfarin for VTE prophylaxis with conservative INR goal.  Heparin 5000 units SQ TID until INR >= 1.5 per MD  INR not really rising yet after first dose last night.   Also on ECASA 81mg /d.  Heart healthy diet, none charted.  Goal of Therapy:  INR 1.5-2 per MD   Plan:   Repeat warfarin 2.5mg  po x 1 at 1800.  F/u daily PT/INR.  Coumadin book and video ordered.  F/u for education 12/1.  Charolotte Eke, PharmD, pager [phone]. 10/11/2012,7:24 AM.

## 2012-10-11 NOTE — Progress Notes (Signed)
Text-paged MD asking that he sign the 30-Day Note in Pt's chart.  Providence Crosby, LCSWA Clinical Social Work [phone]

## 2012-10-11 NOTE — Progress Notes (Signed)
Subjective:    Awake alert Ox4. Tolerating po diet and po narcotics.  Started coumadin for anti DVT prophylaxis and heparin bridge. Likely early next week to SNF.  Patient reports pain as mild.    Objective:   VITALS:  Temp:  [98 F (36.7 C)-98.1 F (36.7 C)] 98.1 F (36.7 C) (11/30 0445) Pulse Rate:  [48-65] 65  (11/30 0445) Resp:  [18-19] 18  (11/30 0445) BP: (109-162)/(44-58) 152/58 mmHg (11/30 0904) SpO2:  [94 %-98 %] 96 % (11/30 0445) Weight:  [71.7 kg (158 lb 1.1 oz)] 71.7 kg (158 lb 1.1 oz) (11/29 1640)  Neurologically intact ABD soft Neurovascular intact Sensation intact distally Intact pulses distally Dorsiflexion/Plantar flexion intact left hip is painful with any ROM and limits her mobility   LABS  Basename 10/11/12 0518 10/10/12 1136  HGB 9.3* 11.0*  WBC 6.9 8.2  PLT 390 405*    Basename 10/11/12 0518 10/10/12 1136  NA 125* 121*  K 5.1 5.1  CL 91* 86*  CO2 27 24  BUN 22 24*  CREATININE 1.45* 1.38*  GLUCOSE 107* 125*    Basename 10/11/12 0518 10/10/12 2027  LABPT -- --  INR 1.07 1.02     Assessment/Plan:      Advance diet Up with therapy Discharge to SNF early next week.  NITKA,JAMES E 10/11/2012, 12:14 PM

## 2012-10-12 ENCOUNTER — Inpatient Hospital Stay (HOSPITAL_COMMUNITY): Payer: Medicare Other

## 2012-10-12 DIAGNOSIS — E871 Hypo-osmolality and hyponatremia: Secondary | ICD-10-CM

## 2012-10-12 DIAGNOSIS — I10 Essential (primary) hypertension: Secondary | ICD-10-CM

## 2012-10-12 DIAGNOSIS — E875 Hyperkalemia: Secondary | ICD-10-CM | POA: Diagnosis present

## 2012-10-12 LAB — BASIC METABOLIC PANEL
BUN: 23 mg/dL (ref 6–23)
CO2: 24 mEq/L (ref 19–32)
Calcium: 8.3 mg/dL — ABNORMAL LOW (ref 8.4–10.5)
Chloride: 88 mEq/L — ABNORMAL LOW (ref 96–112)
GFR calc non Af Amer: 33 mL/min — ABNORMAL LOW (ref >90)
Glucose, Bld: 134 mg/dL — ABNORMAL HIGH (ref 70–99)
Glucose, Bld: 103 mg/dL — ABNORMAL HIGH (ref 70–99)
Potassium: 5.7 mEq/L — ABNORMAL HIGH (ref 3.5–5.1)
Sodium: 118 mEq/L — CL (ref 135–145)

## 2012-10-12 MED ORDER — WARFARIN SODIUM 5 MG PO TABS
5.0000 mg | ORAL_TABLET | Freq: Once | ORAL | Status: AC
  Administered 2012-10-12: 5 mg via ORAL
  Filled 2012-10-12: qty 1

## 2012-10-12 MED ORDER — FUROSEMIDE 80 MG PO TABS
80.0000 mg | ORAL_TABLET | Freq: Every day | ORAL | Status: DC
  Administered 2012-10-13: 80 mg via ORAL
  Filled 2012-10-12 (×2): qty 1

## 2012-10-12 MED ORDER — FUROSEMIDE 10 MG/ML IJ SOLN
80.0000 mg | Freq: Once | INTRAMUSCULAR | Status: DC
  Filled 2012-10-12: qty 8

## 2012-10-12 MED ORDER — SODIUM CHLORIDE 0.9 % IV SOLN: 250.0000 mL | INTRAVENOUS | Status: DC | PRN

## 2012-10-12 MED ORDER — SODIUM POLYSTYRENE SULFONATE 15 GM/60ML PO SUSP
15.0000 g | Freq: Once | ORAL | Status: AC
  Administered 2012-10-12: 15 g via ORAL
  Filled 2012-10-12: qty 60

## 2012-10-12 MED ORDER — FUROSEMIDE 10 MG/ML IJ SOLN
80.0000 mg | Freq: Two times a day (BID) | INTRAMUSCULAR | Status: AC
  Administered 2012-10-12 (×2): 80 mg via INTRAVENOUS
  Filled 2012-10-12 (×3): qty 8

## 2012-10-12 NOTE — Progress Notes (Signed)
Subjective:     Patient is out of bed to a recliner this morning with physical therapy his assistance. She is awake alert oriented x4. Tolerating medicines by mouth.  Patient reports pain as moderate.    Objective:   VITALS:  Temp:  [97.4 F (36.3 C)-97.6 F (36.4 C)] 97.6 F (36.4 C) (12/01 0537) Pulse Rate:  [52-55] 52  (12/01 0537) Resp:  [18-20] 18  (12/01 0537) BP: (100-124)/(48-57) 124/48 mmHg (12/01 0537) SpO2:  [90 %-92 %] 92 % (12/01 0537)  Neurologically intact ABD soft Neurovascular intact Sensation intact distally Intact pulses distally Dorsiflexion/Plantar flexion intact   LABS  Basename 10/11/12 0518 10/10/12 1136  HGB 9.3* 11.0*  WBC 6.9 8.2  PLT 390 405*    Basename 10/12/12 0540 10/11/12 0518  NA 118* 125*  K 5.7* 5.1  CL 88* 91*  CO2 24 27  BUN 22 22  CREATININE 1.49* 1.45*  GLUCOSE 134* 107*    Basename 10/12/12 0540 10/11/12 0518  LABPT -- --  INR 1.10 1.07     Assessment/Plan:      Advance diet Up with therapy Discharge to SNF  NITKA,JAMES E 10/12/2012, 10:29 AM

## 2012-10-12 NOTE — Progress Notes (Signed)
ANTICOAGULATION CONSULT NOTE - follow up  Pharmacy Consult for Warfarin Indication: VTE prophylaxis  Allergies  Allergen Reactions  . Penicillins Rash   Patient Measurements: Height: 5\' 2"  (157.5 cm) Weight: 158 lb 1.1 oz (71.7 kg) IBW/kg (Calculated) : 50.1   Vital Signs: Temp: 97.6 F (36.4 C) (12/01 0537) Temp src: Oral (12/01 0537) BP: 124/48 mmHg (12/01 0537) Pulse Rate: 52  (12/01 0537)  Labs:  Basename 10/12/12 0540 10/11/12 0518 10/10/12 2027 10/10/12 1136  HGB -- 9.3* -- 11.0*  HCT -- 28.3* -- 32.2*  PLT -- 390 -- 405*  APTT -- -- -- --  LABPROT 14.1 13.8 13.3 --  INR 1.10 1.07 1.02 --  HEPARINUNFRC -- -- -- --  CREATININE 1.49* 1.45* -- 1.38*  CKTOTAL -- -- -- --  CKMB -- -- -- --  TROPONINI -- -- -- --    Estimated Creatinine Clearance: 27 ml/min (by C-G formula based on Cr of 1.49).  Assessment:  78 yo female with bilateral pubic rami fractures without h/o trauma  Warfarin for VTE prophylaxis with conservative INR goal.  Heparin 5000 units SQ TID until INR >= 1.5 per MD  INR slow to respond after 2.5mg  x 2 days. Conservative dosing d/t age and low goal INR.   Also on ECASA 81mg /d. No bleeding reported/documented.  Heart healthy diet, none charted.  Goal of Therapy:  INR 1.5-2 per MD   Plan:   Give warfarin 5mg  PO early at 1200 today.  F/u daily PT/INR.  Likely discharge to SNF.  Charolotte Eke, PharmD, pager [phone]. 10/12/2012,7:58 AM.

## 2012-10-12 NOTE — Progress Notes (Signed)
[facility] PROGRESS NOTE Assessment/Plan: AKI: - baseline cr 0.8 6.29.2013. Worsen today with IV fluids, no urinary Na or Cr. Done on admission. Not on any nephrotoxic drugs, no contrast on admission. Most likely 2/2 to fluid overload. - Creatine trending up. Lasix strict I and O's, fluid restrict her diet. - check B-met in am. - ECHO on 01/19/2008 ejection fraction was estimated , range being 65 % to 70 %  Hyponatremia: - ? SIADH 2/2 to stress, restrict fluids. Sodium should not increase >12 mEq/l in 24 hrs. ? - lasix, + JVD with new cough. No leukocytosis or fever. - ? Fluid overload. CXR.  Hyperkalemia: - 2/2 AKI. - kayexalate IV lasix B-met in am.  Fracture of superior pubic ramus (10/10/2012) - No history of antecedent trauma or fall, and no radiographic evidence of lytic lesions.  -  Dr Otelia Sergeant orthopedic, recommended conservative management, appriciate assistance. - TSH WNL, Vit D level 31  CAD (coronary artery disease) (10/10/2012) - stable, monitor.  Bilateral lower leg cellulitis (10/10/2012) - 09/27/12, and has completed a 10-day course of Clindamycin and Bactrim. - avoid bactrim due to renal function. Stop clindamycin. Start doxy.  Depression (10/10/2012)/Dementia (10/10/2012) -continue pre-admission psychotropics.  Constipation (10/10/2012): - miralax. - Senakot.  Code Status: FULL CODE Family Communication: Spouse is main contact; Tel: [phone].  Disposition Plan: SNF 12.2   Consultants:  ortho  Procedures:  none  Antibiotics: None  HPI/Subjective: No pain while at rest. Did quite well with PT, felt confident.   Objective: Filed Vitals:   10/11/12 0900 10/11/12 0904 10/11/12 2210 10/12/12 0537  BP: 162/52 152/58 100/57 124/48  Pulse:   55 52  Temp:   97.4 F (36.3 C) 97.6 F (36.4 C)  TempSrc:   Oral Oral  Resp:   20 18  Height:      Weight:      SpO2:   90% 92%    Intake/Output Summary (Last 24 hours) at 10/12/12  0819 Last data filed at 10/12/12 0539  Gross per 24 hour  Intake 1503.33 ml  Output    775 ml  Net 728.33 ml   Filed Weights   10/10/12 1640  Weight: 71.7 kg (158 lb 1.1 oz)    Exam:  General: Alert, awake, oriented x3, in no acute distress.  HEENT: No bruits, no goiter. + JVD. Heart: Regular rate and rhythm, without murmurs, rubs, gallops.  Lungs: Good air movement, crackles at bases B/L Abdomen: Soft, nontender, nondistended, positive bowel sounds.  Neuro: intact.   Data Reviewed: Basic Metabolic Panel:  Lab 10/12/12 0981 10/11/12 0518 10/10/12 1136  NA 118* 125* 121*  K 5.7* 5.1 5.1  CL 88* 91* 86*  CO2 24 27 24   GLUCOSE 134* 107* 125*  BUN 22 22 24*  CREATININE 1.49* 1.45* 1.38*  CALCIUM 8.2* 8.2* 8.8  MG -- -- --  PHOS -- -- --   Liver Function Tests:  Lab 10/11/12 0518  AST 20  ALT 17  ALKPHOS 134*  BILITOT 0.2*  PROT 5.3*  ALBUMIN 2.8*   No results found for this basename: LIPASE:5,AMYLASE:5 in the last 168 hours No results found for this basename: AMMONIA:5 in the last 168 hours CBC:  Lab 10/11/12 0518 10/10/12 1136  WBC 6.9 8.2  NEUTROABS -- 5.6  HGB 9.3* 11.0*  HCT 28.3* 32.2*  MCV 89.6 87.0  PLT 390 405*   Cardiac Enzymes: No results found for this basename: CKTOTAL:5,CKMB:5,CKMBINDEX:5,TROPONINI:5 in the last 168 hours BNP (last 3 results) No  results found for this basename: PROBNP:3 in the last 8760 hours CBG: No results found for this basename: GLUCAP:5 in the last 168 hours  No results found for this or any previous visit (from the past 240 hour(s)).   Studies: Dg Lumbar Spine Complete  10/10/2012  *RADIOLOGY REPORT*  Clinical Data: Back pain, left hip pain  LUMBAR SPINE - COMPLETE 4+ VIEW  Comparison: 11/28/2011  Findings: Five views of the lumbar spine submitted.  Diffuse osteopenia.  Multilevel disc space flattening with vacuum disc phenomenon and anterior spurring.  No acute fracture or subluxation.  Atherosclerotic  calcifications of the abdominal aorta and the iliac arteries are noted.  There is right hip prosthesis anatomic alignment.  Mild displaced fracture or bilateral superior pubic ramus.  Probable nondisplaced fracture of the right inferior pubic ramus.  Mild displaced fracture of the left inferior pubic ramus.  IMPRESSION: No lumbar spine acute fracture or subluxation.  Diffuse osteopenia. Multilevel degenerative changes.  Mild displaced fracture bilateral superior pubic ramus.  Mild displaced fracture left inferior pubic ramus.  Probable nondisplaced fracture right inferior pubic ramus.   Original Report Authenticated By: Natasha Mead, M.D.    Dg Hip Complete Left  10/10/2012  *RADIOLOGY REPORT*  Clinical Data: Hip pain  LEFT HIP - COMPLETE 2+ VIEW  Comparison: 02/20/2012  Findings: Three views of the left hip submitted.  Mild spurring of the left humeral head.  There is mild displaced fracture of the left superior pubic ramus at the level of the superior acetabulum. Probable nondisplaced fracture or bilateral inferior pubic ramus. Mild displaced fracture of the right superior pubic ramus.  Right hip prosthesis in anatomic alignment.  IMPRESSION: No hip dislocation.  Spurring of the left femoral head.  Mild displaced fracture of the left superior pubic ramus at the level of the left acetabulum.  Probable nondisplaced fracture bilateral inferior pubic ramus.  Mild displaced fracture of the right superior pubic ramus.  Correlation with CT scan is recommended.   Original Report Authenticated By: Natasha Mead, M.D.    Dg Femur Left  10/10/2012  *RADIOLOGY REPORT*  Clinical Data: Hip pain  LEFT FEMUR - 2 VIEW  Comparison: Left hip same day.  Findings: Two views of the left femur submitted.  No acute fracture or subluxation.  Mild spurring of femoral head.  Mild degenerative changes left knee joint.  IMPRESSION: No acute fracture or subluxation.  Mild spurring of femoral head. Mild degenerative changes left knee joint.    Original Report Authenticated By: Natasha Mead, M.D.    Abd 1 View (kub)  10/10/2012  *RADIOLOGY REPORT*  Clinical Data: Constipation for 4 days.  ABDOMEN - 1 VIEW  Comparison: Pelvic radiograph performed 05/09/2012, and lumbar spine radiographs performed earlier today at 12:39 p.m.  Findings: The visualized bowel gas pattern is unremarkable. Scattered air and stool filled loops of colon are seen; no abnormal dilatation of small bowel loops is seen to suggest small bowel obstruction.  No free intra-abdominal air is identified, though evaluation for free air is limited on a single supine view.  Degenerative change is noted along the lumbar spine, with multilevel vacuum phenomenon and sclerosis.  Mild sclerotic change is noted at the sacroiliac joints.  There is diffuse lucency noted within the ischium surrounding the patient's right hip arthroplasty, raising concern for focal osteolysis as a result of particle disease; this appears to have progressed slightly since the prior pelvic radiograph.  Mild degenerative superior narrowing is noted at the left hip joint. The  visualized lung bases are essentially clear.  IMPRESSION:  1.  Unremarkable bowel gas pattern; no free intra-abdominal air seen. 2.  Diffuse lucency within the ischium overlying the patient's right hip arthroplasty, raising concern for focal osteolysis as a result of particle disease.  This appears to have progressed slightly since the prior pelvic radiograph. 3.  Degenerative change noted along the lumbar spine.   Original Report Authenticated By: Tonia Ghent, M.D.     Scheduled Meds:    . acetaminophen  650 mg Oral TID  . ALPRAZolam  0.25 mg Oral TID  . ARIPiprazole  5 mg Oral QHS  . aspirin EC  81 mg Oral Daily  . brimonidine  1 drop Both Eyes Q12H   And  . timolol  1 drop Both Eyes Q12H  . calcitonin (salmon)  1 spray Alternating Nares Daily  . calcium-vitamin D  1 tablet Oral TID  . [COMPLETED] coumadin book   Does not apply Once   . cycloSPORINE  1 drop Both Eyes BID  . doxycycline (VIBRAMYCIN) IV  100 mg Intravenous Q12H  . heparin  5,000 Units Subcutaneous Q8H  . lamoTRIgine  150 mg Oral QHS  . levothyroxine  88 mcg Oral QAC breakfast  . nebivolol  10 mg Oral Q breakfast  . pantoprazole  40 mg Oral Daily  . polyethylene glycol  17 g Oral BID  . polyethylene glycol  17 g Oral Daily  . sertraline  100 mg Oral Daily  . [COMPLETED] sodium chloride  500 mL Intravenous Once  . sodium chloride  3 mL Intravenous Q12H  . sodium chloride  3 mL Intravenous Q12H  . [COMPLETED] vitamin A & D      . vitamin C  1,000 mg Oral Daily  . [COMPLETED] warfarin  2.5 mg Oral ONCE-1800  . warfarin  5 mg Oral Once  . [COMPLETED] warfarin   Does not apply Once  . Warfarin - Pharmacist Dosing Inpatient   Does not apply q1800   Continuous Infusions:    . [EXPIRED] sodium chloride 100 mL/hr (10/11/12 1114)     Radonna Ricker Rosine Beat  [facility] Pager [phone].  If 8PM-8AM, please contact night-coverage at [email or web address], password [facility] 10/12/2012, 8:19 AM  LOS: 2 days

## 2012-10-12 NOTE — Progress Notes (Addendum)
CRITICAL VALUE ALERT  Critical value received:  Na+ 118  Date of notification:  10/12/12  Time of notification:  0630  Critical value read back:yes  Nurse who received alert:  Newman Nip, RN  MD notified (1st page):  M. Burnadette Peter, NP  Time of first page:  [phone]  MD notified (2nd page):  Time of second page:  Responding MD:  M. Burnadette Peter, NP  Time MD responded:  [phone]

## 2012-10-13 DIAGNOSIS — S32509A Unspecified fracture of unspecified pubis, initial encounter for closed fracture: Secondary | ICD-10-CM

## 2012-10-13 DIAGNOSIS — E875 Hyperkalemia: Secondary | ICD-10-CM

## 2012-10-13 DIAGNOSIS — X58XXXA Exposure to other specified factors, initial encounter: Secondary | ICD-10-CM

## 2012-10-13 DIAGNOSIS — S329XXA Fracture of unspecified parts of lumbosacral spine and pelvis, initial encounter for closed fracture: Secondary | ICD-10-CM

## 2012-10-13 HISTORY — DX: Fracture of unspecified parts of lumbosacral spine and pelvis, initial encounter for closed fracture: S32.9XXA

## 2012-10-13 LAB — BASIC METABOLIC PANEL
CO2: 24 mEq/L (ref 19–32)
Calcium: 8.5 mg/dL (ref 8.4–10.5)
Creatinine, Ser: 1.22 mg/dL — ABNORMAL HIGH (ref 0.50–1.10)
Glucose, Bld: 110 mg/dL — ABNORMAL HIGH (ref 70–99)

## 2012-10-13 LAB — PATHOLOGIST SMEAR REVIEW

## 2012-10-13 MED ORDER — FUROSEMIDE 10 MG/ML IJ SOLN
80.0000 mg | Freq: Once | INTRAMUSCULAR | Status: DC
  Filled 2012-10-13: qty 8

## 2012-10-13 MED ORDER — DOXYCYCLINE HYCLATE 100 MG PO TABS: 100.0000 mg | ORAL_TABLET | Freq: Two times a day (BID) | ORAL | Status: AC

## 2012-10-13 MED ORDER — CALCITONIN (SALMON) 200 UNIT/ACT NA SOLN: 1.0000 | Freq: Every day | NASAL | Status: DC

## 2012-10-13 MED ORDER — ALPRAZOLAM 0.25 MG PO TABS: 0.2500 mg | ORAL_TABLET | Freq: Three times a day (TID) | ORAL | Status: DC

## 2012-10-13 MED ORDER — FUROSEMIDE 80 MG PO TABS: 80.0000 mg | ORAL_TABLET | Freq: Every day | ORAL | Status: DC

## 2012-10-13 MED ORDER — OXYCODONE HCL 5 MG PO CAPS: 10.0000 mg | ORAL_CAPSULE | Freq: Four times a day (QID) | ORAL | Status: DC | PRN

## 2012-10-13 MED ORDER — WARFARIN SODIUM 5 MG PO TABS: 5.0000 mg | ORAL_TABLET | Freq: Every day | ORAL | Status: DC

## 2012-10-13 MED ORDER — CALCIUM CARBONATE-VITAMIN D 500-200 MG-UNIT PO TABS: 1.0000 | ORAL_TABLET | Freq: Three times a day (TID) | ORAL | Status: DC

## 2012-10-13 MED ORDER — SODIUM POLYSTYRENE SULFONATE 15 GM/60ML PO SUSP
15.0000 g | Freq: Once | ORAL | Status: AC
  Administered 2012-10-13: 15 g via ORAL
  Filled 2012-10-13: qty 60

## 2012-10-13 MED ORDER — ALENDRONATE SODIUM 70 MG PO TABS: 70.0000 mg | ORAL_TABLET | ORAL | Status: DC

## 2012-10-13 NOTE — Progress Notes (Addendum)
Patient cleared for discharge. Packet copied and placed in [place].  Jenny [name] C. Cyruss Arata MSW, LCSW [phone] ptar called for transportation. Patient and spouse at bedside informed.

## 2012-10-13 NOTE — Discharge Summary (Addendum)
Physician Discharge Summary  Jenny [name]:[id number] DOB: 09/05/1927 DOA: 10/10/2012  PCP: Kimber Relic, MD  Admit date: 10/10/2012 Discharge date: 10/13/2012  Time spent: 30 minutes  Recommendations for Outpatient Follow-up:  1. Follow up with PCP 4 weeks.   Discharge Diagnoses:  Principal Problem:  *Fracture of superior pubic ramus Active Problems:  Hyponatremia  AKI (acute kidney injury)  CAD (coronary artery disease)  Bilateral lower leg cellulitis  Depression  Dementia  Constipation  Hyperkalemia   Discharge Condition: stbale  Diet recommendation: heart healthy diet  Filed Weights   10/10/12 1640 10/13/12 0558  Weight: 71.7 kg (158 lb 1.1 oz) 74 kg (163 lb 2.3 oz)    History of present illness:  78 year old female, with known history of HTN, s/p CVA, CAD, dyslipidemia, hypothyroidism, depression, dementia, OA, Spinal stenosis, s/p right total knee arthroplasty 04/2012, s/p bilateral cataract surgery, s/p surgery for retinal detachment. According to patient and family, who were present in the ED, patient has ben experiencing pain around the left hip and upper part of her left leg for several months, and this has become much worse in the last 10 days, with increasing difficulty in ambulation. She has had no falls or other antecedent trauma, and on 09/27/12, was evaluated at the [facility], for bilateral LE pains and redness, with oozing fluid, and was placed on a 10-day course of oral Clindamycin and Bactrim DS, which she has now completed. LE venous doppler on 09/30/2012. She has had no bowel movement for 5 days.    Hospital Course:  AKI:  - baseline cr 0.8 6.29.2013. Worsen on 11.30.2013 with IV fluids, no urinary Na or Cr. Done on admission. Not on any nephrotoxic drugs, no contrast on admission. Most likely 2/2 to fluid overload.  - given lasix for 2 days Lasix strict I and O's, fluid restrict her diet. Creatinine improved. - ECHO on 01/19/2008 ejection fraction was  estimated , range being 65 % to 70 %. - lasix increase for home to 80 mg daily.  Hyponatremia:  - ? SIADH 2/2 to stress, restrict fluids.  - Given high dose of lasix and resolved. - CXR : mild pulmonary edema.  Hyperkalemia:  - 2/2 AKI.  - resolved with kayexalate .  Fracture of superior pubic ramus (10/10/2012) - No history of antecedent trauma or fall, and no radiographic evidence of lytic lesions.  - Dr Otelia Sergeant orthopedic, recommended conservative management, appriciate assistance.  - TSH WNL, Vit D level 31. - continue vit d, calcitonin and biphosphate as an outpatient. - ortho recommended warfarin for DVT prophylaxis.  CAD (coronary artery disease) (10/10/2012) - stable, monitor.   Bilateral lower leg cellulitis (10/10/2012) - 09/27/12, and has completed a 10-day course of Clindamycin and Bactrim.  - avoid bactrim due to renal function. Stop clindamycin. Start doxy.   Depression (10/10/2012)/Dementia (10/10/2012) -continue pre-admission psychotropics.   Constipation (10/10/2012):  - miralax.  - Senakot.   Procedures:  none (i.e. Studies not automatically included, echos, thoracentesis, etc; not x-rays)  Consultations:  none  Discharge Exam: Filed Vitals:   10/12/12 0537 10/12/12 1332 10/12/12 2126 10/13/12 0558  BP: 124/48 108/63 129/53 124/71  Pulse: 52 71 62 64  Temp: 97.6 F (36.4 C) 97.8 F (36.6 C) 97.8 F (36.6 C) 98.4 F (36.9 C)  TempSrc: Oral Oral Oral Oral  Resp: 18  18 16   Height:      Weight:    74 kg (163 lb 2.3 oz)  SpO2: 92% 98% 92%  95%    General: A&Ox3  Cardiovascular: RRR Respiratory: good air movement  Discharge Instructions      Discharge Orders    Future Appointments: Provider: Department: Dept Phone: Center:   09/02/2013 12:30 PM Sherrie George, MD [facility] [phone] None     Future Orders Please Complete By Expires   Diet - low sodium heart healthy      Increase activity slowly           Medication List     As of 10/13/2012 10:36 AM    STOP taking these medications         clindamycin 150 MG capsule   Commonly known as: CLEOCIN      magnesium hydroxide 400 MG/5ML suspension   Commonly known as: MILK OF MAGNESIA      sulfamethoxazole-trimethoprim 800-160 MG per tablet   Commonly known as: BACTRIM DS,SEPTRA DS      TAKE these medications         acetaminophen 325 MG tablet   Commonly known as: TYLENOL   Take 650 mg by mouth 3 (three) times daily.      alendronate 70 MG tablet   Commonly known as: FOSAMAX   Take 1 tablet (70 mg total) by mouth every 7 (seven) days. Take with a full glass of water on an empty stomach.      ALPRAZolam 0.25 MG tablet   Commonly known as: XANAX   Take 1 tablet (0.25 mg total) by mouth 3 (three) times daily.      ARIPiprazole 5 MG tablet   Commonly known as: ABILIFY   Take 5 mg by mouth at bedtime.      aspirin EC 81 MG tablet   Take 81 mg by mouth daily.      calcitonin (salmon) 200 UNIT/ACT nasal spray   Commonly known as: MIACALCIN/FORTICAL   Place 1 spray into the nose daily.      calcium-vitamin D 500-200 MG-UNIT per tablet   Commonly known as: OSCAL WITH D   Take 1 tablet by mouth 3 (three) times daily.      COMBIGAN 0.2-0.5 % ophthalmic solution   Generic drug: brimonidine-timolol   Place 1 drop into both eyes every 12 (twelve) hours.      cycloSPORINE 0.05 % ophthalmic emulsion   Commonly known as: RESTASIS   Place 1 drop into both eyes 2 (two) times daily. In both eyes      doxycycline 100 MG tablet   Commonly known as: VIBRA-TABS   Take 1 tablet (100 mg total) by mouth 2 (two) times daily.      furosemide 80 MG tablet   Commonly known as: LASIX   Take 1 tablet (80 mg total) by mouth daily with breakfast.      lamoTRIgine 150 MG tablet   Commonly known as: LAMICTAL   Take 150 mg by mouth at bedtime.      levothyroxine 88 MCG tablet   Commonly known as: SYNTHROID, LEVOTHROID   Take 88 mcg by  mouth daily with breakfast. For thyroid      naproxen sodium 220 MG tablet   Commonly known as: ANAPROX   Take 220 mg by mouth 2 (two) times daily.      nebivolol 10 MG tablet   Commonly known as: BYSTOLIC   Take 10 mg by mouth daily with breakfast. To control blood pressure      omeprazole 20 MG capsule   Commonly known as:  PRILOSEC   Take 20 mg by mouth daily.      oxycodone 5 MG capsule   Commonly known as: OXY-IR   Take 2 capsules (10 mg total) by mouth every 6 (six) hours as needed. For pain      polyethylene glycol packet   Commonly known as: MIRALAX / GLYCOLAX   Take 17 g by mouth daily.      senna 8.6 MG Tabs   Commonly known as: SENOKOT   Take 2 tablets by mouth at bedtime.      sertraline 100 MG tablet   Commonly known as: ZOLOFT   Take 100 mg by mouth daily.      vitamin C 1000 MG tablet   Take 1,000 mg by mouth daily with breakfast.      warfarin 5 MG tablet   Commonly known as: COUMADIN   Take 1 tablet (5 mg total) by mouth daily.      zaleplon 10 MG capsule   Commonly known as: SONATA   Take 10 mg by mouth at bedtime as needed. For insomnia         Follow-up Information    Follow up with GREEN, Lenon Curt, MD.   Contact information:   [address] [place] [address] [phone]       Follow up with GREEN, Lenon Curt, MD. In 4 weeks. (hospital follow up)    Contact information:   [address] [place] [address] [phone]           The results of significant diagnostics from this hospitalization (including imaging, microbiology, ancillary and laboratory) are listed below for reference.    Significant Diagnostic Studies: Dg Chest 1 View  10/12/2012  *RADIOLOGY REPORT*  Clinical Data: Cough and shortness of breath  CHEST - 1 VIEW  Comparison: Chest radiograph 01/01/2012  Findings: Cardiomegaly.  Pulmonary vascular congestion is present and there is diffuse interstitial prominence throughout both lungs. There is right  perihilar/basilar airspace disease.  Small right pleural effusion is suspected.  IMPRESSION: Radiographic pattern most suggestive of congestive heart failure. Infection, particularly the right lung base cannot be completely excluded, but felt less likely.   Original Report Authenticated By: Britta Mccreedy, M.D.    Dg Lumbar Spine Complete  10/10/2012  *RADIOLOGY REPORT*  Clinical Data: Back pain, left hip pain  LUMBAR SPINE - COMPLETE 4+ VIEW  Comparison: 11/28/2011  Findings: Five views of the lumbar spine submitted.  Diffuse osteopenia.  Multilevel disc space flattening with vacuum disc phenomenon and anterior spurring.  No acute fracture or subluxation.  Atherosclerotic calcifications of the abdominal aorta and the iliac arteries are noted.  There is right hip prosthesis anatomic alignment.  Mild displaced fracture or bilateral superior pubic ramus.  Probable nondisplaced fracture of the right inferior pubic ramus.  Mild displaced fracture of the left inferior pubic ramus.  IMPRESSION: No lumbar spine acute fracture or subluxation.  Diffuse osteopenia. Multilevel degenerative changes.  Mild displaced fracture bilateral superior pubic ramus.  Mild displaced fracture left inferior pubic ramus.  Probable nondisplaced fracture right inferior pubic ramus.   Original Report Authenticated By: Natasha Mead, M.D.    Dg Hip Complete Left  10/10/2012  *RADIOLOGY REPORT*  Clinical Data: Hip pain  LEFT HIP - COMPLETE 2+ VIEW  Comparison: 02/20/2012  Findings: Three views of the left hip submitted.  Mild spurring of the left humeral head.  There is mild displaced fracture of the left superior pubic ramus at the level of  the superior acetabulum. Probable nondisplaced fracture or bilateral inferior pubic ramus. Mild displaced fracture of the right superior pubic ramus.  Right hip prosthesis in anatomic alignment.  IMPRESSION: No hip dislocation.  Spurring of the left femoral head.  Mild displaced fracture of the left superior  pubic ramus at the level of the left acetabulum.  Probable nondisplaced fracture bilateral inferior pubic ramus.  Mild displaced fracture of the right superior pubic ramus.  Correlation with CT scan is recommended.   Original Report Authenticated By: Natasha Mead, M.D.    Dg Femur Left  10/10/2012  *RADIOLOGY REPORT*  Clinical Data: Hip pain  LEFT FEMUR - 2 VIEW  Comparison: Left hip same day.  Findings: Two views of the left femur submitted.  No acute fracture or subluxation.  Mild spurring of femoral head.  Mild degenerative changes left knee joint.  IMPRESSION: No acute fracture or subluxation.  Mild spurring of femoral head. Mild degenerative changes left knee joint.   Original Report Authenticated By: Natasha Mead, M.D.    Abd 1 View (kub)  10/10/2012  *RADIOLOGY REPORT*  Clinical Data: Constipation for 4 days.  ABDOMEN - 1 VIEW  Comparison: Pelvic radiograph performed 05/09/2012, and lumbar spine radiographs performed earlier today at 12:39 p.m.  Findings: The visualized bowel gas pattern is unremarkable. Scattered air and stool filled loops of colon are seen; no abnormal dilatation of small bowel loops is seen to suggest small bowel obstruction.  No free intra-abdominal air is identified, though evaluation for free air is limited on a single supine view.  Degenerative change is noted along the lumbar spine, with multilevel vacuum phenomenon and sclerosis.  Mild sclerotic change is noted at the sacroiliac joints.  There is diffuse lucency noted within the ischium surrounding the patient's right hip arthroplasty, raising concern for focal osteolysis as a result of particle disease; this appears to have progressed slightly since the prior pelvic radiograph.  Mild degenerative superior narrowing is noted at the left hip joint. The visualized lung bases are essentially clear.  IMPRESSION:  1.  Unremarkable bowel gas pattern; no free intra-abdominal air seen. 2.  Diffuse lucency within the ischium overlying the  patient's right hip arthroplasty, raising concern for focal osteolysis as a result of particle disease.  This appears to have progressed slightly since the prior pelvic radiograph. 3.  Degenerative change noted along the lumbar spine.   Original Report Authenticated By: Tonia Ghent, M.D.     Microbiology: No results found for this or any previous visit (from the past 240 hour(s)).   Labs: Basic Metabolic Panel:  Lab 10/13/12 2130 10/12/12 1746 10/12/12 0540 10/11/12 0518 10/10/12 1136  NA 124* 122* 118* 125* 121*  K 5.0 4.7 5.7* 5.1 5.1  CL 90* 89* 88* 91* 86*  CO2 24 25 24 27 24   GLUCOSE 110* 103* 134* 107* 125*  BUN 23 23 22 22  24*  CREATININE 1.22* 1.42* 1.49* 1.45* 1.38*  CALCIUM 8.5 8.3* 8.2* 8.2* 8.8  MG -- -- -- -- --  PHOS -- -- -- -- --   Liver Function Tests:  Lab 10/11/12 0518  AST 20  ALT 17  ALKPHOS 134*  BILITOT 0.2*  PROT 5.3*  ALBUMIN 2.8*   No results found for this basename: LIPASE:5,AMYLASE:5 in the last 168 hours No results found for this basename: AMMONIA:5 in the last 168 hours CBC:  Lab 10/11/12 0518 10/10/12 1136  WBC 6.9 8.2  NEUTROABS -- 5.6  HGB 9.3* 11.0*  HCT 28.3* 32.2*  MCV 89.6 87.0  PLT 390 405*   Cardiac Enzymes: No results found for this basename: CKTOTAL:5,CKMB:5,CKMBINDEX:5,TROPONINI:5 in the last 168 hours BNP: BNP (last 3 results) No results found for this basename: PROBNP:3 in the last 8760 hours CBG: No results found for this basename: GLUCAP:5 in the last 168 hours     Signed:  Marinda Elk  [facility] 10/13/2012, 10:36 AM

## 2012-10-13 NOTE — Care Management Note (Signed)
    Page 1 of 1   10/13/2012     12:21:37 PM   CARE MANAGEMENT NOTE 10/13/2012  Patient:  Jenny [name], Jenny [name]   Account Number:  [id number]  Date Initiated:  10/13/2012  Documentation initiated by:  Lanier Clam  Subjective/Objective Assessment:   ADMITTED W/HIP PAINS.     Action/Plan:   FROM ALF   Anticipated DC Date:  10/13/2012   Anticipated DC Plan:  SKILLED NURSING FACILITY      DC Planning Services  CM consult      Choice offered to / List presented to:             Status of service:  Completed, signed off Medicare Important Message given?   (If response is "NO", the following Medicare IM given date fields will be blank) Date Medicare IM given:   Date Additional Medicare IM given:    Discharge Disposition:  SKILLED NURSING FACILITY  Per UR Regulation:  Reviewed for med. necessity/level of care/duration of stay  If discussed at Long Length of Stay Meetings, dates discussed:    Comments:  10/14/12 [facility] RN,BSN NCM [phone]

## 2012-10-13 NOTE — Progress Notes (Signed)
  Pharmacy Note (Brief) - Warfarin Education  Standard warfarin education completed with patient. Pt expressed verbal understanding. Pt is a vegetarian; approx 5 min were spent with the patient explaining the importance of eating a consistent amount of green vegetables. All questions answered. Pt going to SNF later today. Note ortho MD requests an INR goal of 1.5-2.  Darrol Angel, PharmD Pager: [phone] 10/13/2012 12:10 PM

## 2012-10-13 NOTE — Progress Notes (Addendum)
Subjective:    Awake alert Ox4. Foley in place. Receiving lasix.  Patient reports pain as moderate.    Objective:   VITALS:  Temp:  [97.8 F (36.6 C)-98.4 F (36.9 C)] 98.4 F (36.9 C) (12/02 0558) Pulse Rate:  [62-71] 64  (12/02 0558) Resp:  [16-18] 16  (12/02 0558) BP: (108-129)/(53-71) 124/71 mmHg (12/02 0558) SpO2:  [92 %-98 %] 95 % (12/02 0558) Weight:  [74 kg (163 lb 2.3 oz)] 74 kg (163 lb 2.3 oz) (12/02 0558)  Neurologically intact ABD soft Sensation intact distally Intact pulses distally Dorsiflexion/Plantar flexion intact pain with weight bearing left groin, out of bed to bedside recliner   LABS  Basename 10/11/12 0518  HGB 9.3*  WBC 6.9  PLT 390    Basename 10/13/12 0443 10/12/12 1746  NA 124* 122*  K 5.0 4.7  CL 90* 89*  CO2 24 25  BUN 23 23  CREATININE 1.22* 1.42*  GLUCOSE 110* 103*    Basename 10/13/12 0443 10/12/12 0540  LABPT -- --  INR 1.22 1.10     Assessment/Plan:    Bilateral superior and inferior pubic rami fractures, left symptomatic ? Pathologic Vitamin D level is low 31. Urine protein immunophoresis pending.  Advance diet Up with therapy Continue foley due to diuresing patient and urinary output monitoring SNF placement when medically stable Vitamin D supplements. Calcium supplements.  Jenny [name] E 10/13/2012, 1:18 PM

## 2012-10-14 LAB — UIFE/LIGHT CHAINS/TP QN, 24-HR UR
Alpha 2, Urine: DETECTED — AB
Free Kappa Lt Chains,Ur: 1.6 mg/dL (ref 0.14–2.42)
Free Kappa/Lambda Ratio: 1.01 ratio — ABNORMAL LOW (ref 2.04–10.37)
Total Protein, Urine: 4.1 mg/dL

## 2013-01-10 DIAGNOSIS — G244 Idiopathic orofacial dystonia: Secondary | ICD-10-CM

## 2013-01-10 HISTORY — DX: Idiopathic orofacial dystonia: G24.4

## 2013-03-05 ENCOUNTER — Non-Acute Institutional Stay: Payer: Medicare Other | Admitting: Nurse Practitioner

## 2013-03-05 ENCOUNTER — Encounter: Payer: Self-pay | Admitting: Nurse Practitioner

## 2013-03-05 VITALS — BP 162/108 | HR 76 | Temp 97.6°F | Wt 156.0 lb

## 2013-03-05 DIAGNOSIS — R609 Edema, unspecified: Secondary | ICD-10-CM

## 2013-03-05 DIAGNOSIS — I10 Essential (primary) hypertension: Secondary | ICD-10-CM

## 2013-03-05 DIAGNOSIS — R413 Other amnesia: Secondary | ICD-10-CM

## 2013-03-05 DIAGNOSIS — F411 Generalized anxiety disorder: Secondary | ICD-10-CM

## 2013-03-05 DIAGNOSIS — E039 Hypothyroidism, unspecified: Secondary | ICD-10-CM

## 2013-03-05 DIAGNOSIS — Q7649 Other congenital malformations of spine, not associated with scoliosis: Secondary | ICD-10-CM

## 2013-03-05 DIAGNOSIS — F419 Anxiety disorder, unspecified: Secondary | ICD-10-CM

## 2013-03-06 DIAGNOSIS — Q7649 Other congenital malformations of spine, not associated with scoliosis: Secondary | ICD-10-CM | POA: Insufficient documentation

## 2013-03-06 NOTE — Assessment & Plan Note (Signed)
Takes Levothyroxine 88mcg, update TSH 

## 2013-03-06 NOTE — Assessment & Plan Note (Addendum)
Well compensated, takes Furosemide 80mg ,  no dependent edema noted, lungs clear, update CMP

## 2013-03-06 NOTE — Assessment & Plan Note (Signed)
Elevated today, denied HA, chest pain/pressure, dizziness, or palpitation. Will check Bp in am. Continue Bystolic.

## 2013-03-06 NOTE — Assessment & Plan Note (Signed)
F/u Psychiatrist, currently on Zoloft, Lamictal, Abilify--apparently the patient is more anxious than usual--she stated she has something prn her Psychiatrist newly prescribed for her(Xanax prn in the past)

## 2013-03-06 NOTE — Progress Notes (Signed)
Patient ID: Jenny [name], female   DOB: 16-Sep-1927, 79 y.o.   MRN: [id number] Code Status:   Allergies  Allergen Reactions  . Penicillins Rash    Chief Complaint  Patient presents with  . Acute Visit    had anxiety attack on Tuesday 03/03/13, since then confused, fatigue    HPI: Patient is a 79 y.o. female seen in the clinic at [facility] today for increased confusion, anxiety than usual w/o focal neurological deficits.  Problem List Items Addressed This Visit     ICD-9-CM   Memory loss     The patient's husband thinks the patient is more confused, UA pending, will update CBC and CMP. No focal neurological deficit noted upon my examination. Has been on Aricept.     Edema     Well compensated, takes Furosemide 80mg ,  no dependent edema noted, lungs clear, update CMP    Hypothyroidism     Takes Levothyroxine , update TSH    HTN (hypertension)     Elevated today, denied HA, chest pain/pressure, dizziness, or palpitation. Will check Bp in am. Continue Bystolic.     Anxiety     F/u Psychiatrist, currently on Zoloft, Lamictal, Abilify--apparently the patient is more anxious than usual--she stated she has something prn her Psychiatrist newly prescribed for her(Xanax prn in the past)    Relevant Medications      traZODone (DESYREL) 50 MG tablet   Congenital anomaly of spine, unspecified - Primary      Review of Systems:  Review of Systems  Constitutional: Negative for fever, chills, weight loss, malaise/fatigue and diaphoresis.  HENT: Positive for hearing loss. Negative for ear pain, congestion, sore throat and neck pain.   Eyes: Negative for pain, discharge and redness.  Respiratory: Negative for cough, hemoptysis, sputum production, shortness of breath and wheezing.   Cardiovascular: Negative for chest pain, palpitations, orthopnea, claudication, leg swelling and PND.  Gastrointestinal: Negative for heartburn, nausea, vomiting, abdominal pain, diarrhea,  constipation and blood in stool.  Genitourinary: Positive for frequency. Negative for dysuria, urgency, hematuria and flank pain.  Musculoskeletal: Positive for joint pain. Negative for myalgias, back pain and falls.  Skin: Negative for itching and rash.  Neurological: Negative for dizziness, tingling, tremors, sensory change, speech change, focal weakness, seizures, loss of consciousness, weakness and headaches.  Endo/Heme/Allergies: Negative for environmental allergies and polydipsia. Does not bruise/bleed easily.  Psychiatric/Behavioral: Positive for depression and memory loss. Negative for hallucinations. The patient is nervous/anxious. The patient does not have insomnia.      Past Medical History  Diagnosis Date  . Hypertension   . Coronary artery disease   . Heart murmur   . Hypothyroidism 05-02-12    tx. Synthroid  . Headache 05-02-12    past hx. migrianes-many yrs ago  . Arthritis 05-02-12    osteoarthritis-rt. hip  . Pneumonia 05-02-12    mild case  . 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 Acute upper respiratory infections of unspecified site 2014  . Unspecified venous (peripheral) insufficiency 2013  . Senile osteoporosis 2013  . Unspecified closed fracture of pelvis 10/13/2012  . Cellulitis and abscess of leg, except foot 2013  . Altered mental status 2013  . Chronic pain syndrome 2013  . Urinary tract infection, site not specified   . Hyposmolality and/or hyponatremia 05/2012  . Congestive heart failure, unspecified 05/2012  . Pneumonia, organism unspecified 05/2012  . Pressure ulcer, buttock(707.05) 05/2012  . Hypoxemia 05/2012  . Reflux esophagitis 3013  . Unspecified constipation  2013  . Acute posthemorrhagic anemia 05/2012  . Coronary atherosclerosis of native coronary artery 05/2012  . Pain in joint, pelvic region and thigh 04/2012  . Dysphagia, unspecified 03/2012  . Benign neoplasm of adrenal gland 10/2011  . Alzheimer's disease 06/2011  . Other seborrheic keratosis 2012   . Hypertensive renal disease, benign 2012  . Pain in joint, lower leg 2012  . Memory loss 09/2010  . Spinal stenosis, lumbar region, without neurogenic claudication 2011  . Unspecified vitamin D deficiency 2010  . Unspecified disorder of kidney and ureter 2010  . Edema 2010  . Other malaise and fatigue 2009  . Congenital factor VIII disorder 2009  . Senile dementia with delusional features 2008  . Generalized hyperhidrosis 2007  . Irritable bowel syndrome 2007  . Unspecified chronic bronchitis 2006  . Depression 06/2005     depression only,tx. oral meds  . Shortness of breath 2005  . Anxiety 2004  . Unspecified late effects of cerebrovascular disease 2004  . Migraine with aura, without mention of intractable migraine without mention of status migrainosus 2003  . Tinnitus 2003  . Other symptoms involving cardiovascular system 2003  . Hyperlipidemia 2002   Past Surgical History  Procedure Laterality Date  . Tonsillectomy    . Tubal ligation  05-02-12  .  tummy tuck  05-02-12    many yrs ago  . Cataract extraction, bilateral  05-02-12    bilateral  . Retinal detachment surgery  05-02-12    left  . Total hip arthroplasty  05/09/2012    Procedure: TOTAL HIP ARTHROPLASTY ANTERIOR APPROACH;  Surgeon: Kathryne Hitch, MD;  Location: [facility];  Service: Orthopedics;  Laterality: Right;   Social History:   reports that she quit smoking about 4 years ago. She has never used smokeless tobacco. She reports that she does not drink alcohol or use illicit drugs.  Family History  Problem Relation Age of Onset  . Diabetes Father     Medications: Patient's Medications  New Prescriptions   No medications on file  Previous Medications   ACETAMINOPHEN (TYLENOL) 325 MG TABLET    Take 650 mg by mouth 3 (three) times daily.    ARIPIPRAZOLE (ABILIFY) 5 MG TABLET    Take 5 mg by mouth at bedtime.   ASCORBIC ACID (VITAMIN C) 1000 MG TABLET    Take 1,000 mg by mouth daily with breakfast.    ASPIRIN EC 81 MG TABLET    Take 81 mg by mouth daily.   BRIMONIDINE-TIMOLOL (COMBIGAN) 0.2-0.5 % OPHTHALMIC SOLUTION    Place 1 drop into both eyes every 12 (twelve) hours.     CALCITONIN, SALMON, (MIACALCIN/FORTICAL) 200 UNIT/ACT NASAL SPRAY    Place 1 spray into the nose daily.   CALCIUM-VITAMIN D (OSCAL WITH D) 500-200 MG-UNIT PER TABLET    Take 1 tablet by mouth 3 (three) times daily.   FUROSEMIDE (LASIX) 80 MG TABLET    Take 1 tablet (80 mg total) by mouth daily with breakfast.   LAMOTRIGINE (LAMICTAL) 200 MG TABLET    Take 200 mg by mouth daily.   LEVOTHYROXINE (SYNTHROID, LEVOTHROID) 88 MCG TABLET    Take 88 mcg by mouth daily with breakfast. For thyroid   NAPROXEN SODIUM (ANAPROX) 220 MG TABLET    Take 220 mg by mouth 2 (two) times daily.   NEBIVOLOL (BYSTOLIC) 10 MG TABLET    Take 10 mg by mouth daily with breakfast. To control blood pressure   OMEPRAZOLE (PRILOSEC) 20 MG  CAPSULE    Take 20 mg by mouth daily.   POLYETHYLENE GLYCOL (MIRALAX / GLYCOLAX) PACKET    Take 17 g by mouth daily.   POTASSIUM CHLORIDE SA (K-DUR,KLOR-CON) 20 MEQ TABLET    20 mEq. Take one twice daily   SENNA (SENOKOT) 8.6 MG TABS    Take 2 tablets by mouth at bedtime.   SERTRALINE (ZOLOFT) 100 MG TABLET    Take 50 mg by mouth daily.    TRAZODONE (DESYREL) 50 MG TABLET    50 mg. Take one tablet at bedtime   ZALEPLON (SONATA) 10 MG CAPSULE    Take 10 mg by mouth at bedtime as needed. For insomnia  Modified Medications   No medications on file  Discontinued Medications   ALENDRONATE (FOSAMAX) 70 MG TABLET    Take 1 tablet (70 mg total) by mouth every 7 (seven) days. Take with a full glass of water on an empty stomach.   ALPRAZOLAM (XANAX) 0.25 MG TABLET    Take 1 tablet (0.25 mg total) by mouth 3 (three) times daily.   CLONAZEPAM (KLONOPIN) 0.5 MG TABLET    0.5 mg. Take one tablet three times daily   CYCLOSPORINE (RESTASIS) 0.05 % OPHTHALMIC EMULSION    Place 1 drop into both eyes 2 (two) times daily. In both eyes    LAMOTRIGINE (LAMICTAL) 150 MG TABLET    Take 150 mg by mouth at bedtime.    OXYCODONE (OXY-IR) 5 MG CAPSULE    Take 2 capsules (10 mg total) by mouth every 6 (six) hours as needed. For pain   WARFARIN (COUMADIN) 5 MG TABLET    Take 1 tablet (5 mg total) by mouth daily.     Physical Exam: Physical Exam  Constitutional: She appears well-developed and well-nourished. No distress.  HENT:  Head: Normocephalic and atraumatic.  Mouth/Throat: No oropharyngeal exudate.  Eyes: EOM are normal. Pupils are equal, round, and reactive to light. Right eye exhibits no discharge. Left eye exhibits no discharge.  Neck: Normal range of motion. Neck supple. No JVD present. No thyromegaly present.  Cardiovascular: Normal rate and regular rhythm.   Murmur heard.  Systolic murmur is present with a grade of 3/6   No diastolic murmur is present  Pulmonary/Chest: Effort normal and breath sounds normal. No respiratory distress. She has no wheezes. She has no rales.  Abdominal: Soft. Bowel sounds are normal. She exhibits no distension. There is no tenderness.  Musculoskeletal: Normal range of motion. She exhibits no edema and no tenderness.  Lymphadenopathy:    She has no cervical adenopathy.  Neurological: She is alert. She has normal reflexes. She displays normal reflexes. No cranial nerve deficit. She exhibits normal muscle tone. Coordination normal.  Skin: Skin is warm and dry. She is not diaphoretic. No erythema.  Psychiatric: Her mood appears anxious. Her affect is not angry, not blunt, not labile and not inappropriate. Her speech is not delayed, not tangential and not slurred. She is not agitated, not aggressive, not hyperactive, not slowed, not withdrawn, not actively hallucinating and not combative. Thought content is not paranoid and not delusional. Cognition and memory are impaired. She expresses inappropriate judgment. She does not express impulsivity. She exhibits a depressed mood. She is communicative. She  exhibits abnormal recent memory. She exhibits normal remote memory. She is attentive.    Filed Vitals:   03/05/13 1534  BP: 162/108  Pulse: 76  Temp: 97.6 F (36.4 C)  TempSrc: Oral  Weight: 156 lb (70.761 kg)  Labs reviewed: Basic Metabolic Panel:  Recent Labs  16/10/96 1136 10/10/12 1650  10/12/12 0540 10/12/12 1746 10/13/12 0443  NA 121*  --   < > 118* 122* 124*  K 5.1  --   < > 5.7* 4.7 5.0  CL 86*  --   < > 88* 89* 90*  CO2 24  --   < > 24 25 24   GLUCOSE 125*  --   < > 134* 103* 110*  BUN 24*  --   < > 22 23 23   CREATININE 1.38*  --   < > 1.49* 1.42* 1.22*  CALCIUM 8.8  --   < > 8.2* 8.3* 8.5  TSH  --  2.337  --   --   --   --   < > = values in this interval not displayed. Liver Function Tests:  Recent Labs  10/11/12 0518  AST 20  ALT 17  ALKPHOS 134*  BILITOT 0.2*  PROT 5.3*  ALBUMIN 2.8*   No results found for this basename: LIPASE, AMYLASE,  in the last 8760 hours No results found for this basename: AMMONIA,  in the last 8760 hours CBC:  Recent Labs  05/12/12 0353 09/30/12 1555 10/10/12 1136 10/11/12 0518  WBC 12.6* 5.5 8.2 6.9  NEUTROABS  --   --  5.6  --   HGB 9.5* 15.2 11.0* 9.3*  HCT 27.4* 49.2* 32.2* 28.3*  MCV 86.4 94.3 87.0 89.6  PLT 288  --  405* 390   Lipid Panel: No results found for this basename: CHOL, HDL, LDLCALC, TRIG, CHOLHDL, LDLDIRECT,  in the last 8760 hours Anemia Panel: No results found for this basename: FOLATE, IRON, VITAMINB12,  in the last 8760 hours  Past Procedures:     Assessment/Plan Memory loss The patient's husband thinks the patient is more confused, UA pending, will update CBC and CMP. No focal neurological deficit noted upon my examination. Has been on Aricept.   Edema Well compensated, takes Furosemide 80mg ,  no dependent edema noted, lungs clear, update CMP  HTN (hypertension) Elevated today, denied HA, chest pain/pressure, dizziness, or palpitation. Will check Bp in am. Continue  Bystolic.   Hypothyroidism Takes Levothyroxine , update TSH  Anxiety F/u Psychiatrist, currently on Zoloft, Lamictal, Abilify--apparently the patient is more anxious than usual--she stated she has something prn her Psychiatrist newly prescribed for her(Xanax prn in the past)   Family/ Staff Communication: obtain Labs in am if possible.   Goals of Care: safety  Labs/tests ordered: TSH, CBC, CMP, UA and C/S

## 2013-03-06 NOTE — Assessment & Plan Note (Addendum)
The patient's husband thinks the patient is more confused, UA pending, will update CBC and CMP. No focal neurological deficit noted upon my examination. Has been on Aricept.

## 2013-03-12 ENCOUNTER — Non-Acute Institutional Stay: Payer: Medicare Other | Admitting: Internal Medicine

## 2013-03-12 ENCOUNTER — Encounter: Payer: Self-pay | Admitting: Internal Medicine

## 2013-03-12 VITALS — BP 138/74 | HR 60 | Wt 158.0 lb

## 2013-03-12 DIAGNOSIS — S72009D Fracture of unspecified part of neck of unspecified femur, subsequent encounter for closed fracture with routine healing: Secondary | ICD-10-CM

## 2013-03-12 DIAGNOSIS — F411 Generalized anxiety disorder: Secondary | ICD-10-CM

## 2013-03-12 DIAGNOSIS — F0390 Unspecified dementia without behavioral disturbance: Secondary | ICD-10-CM

## 2013-03-12 DIAGNOSIS — L89309 Pressure ulcer of unspecified buttock, unspecified stage: Secondary | ICD-10-CM

## 2013-03-12 DIAGNOSIS — F3289 Other specified depressive episodes: Secondary | ICD-10-CM

## 2013-03-12 DIAGNOSIS — I129 Hypertensive chronic kidney disease with stage 1 through stage 4 chronic kidney disease, or unspecified chronic kidney disease: Secondary | ICD-10-CM

## 2013-03-12 DIAGNOSIS — S32519D Fracture of superior rim of unspecified pubis, subsequent encounter for fracture with routine healing: Secondary | ICD-10-CM

## 2013-03-12 DIAGNOSIS — N183 Chronic kidney disease, stage 3 unspecified: Secondary | ICD-10-CM

## 2013-03-12 DIAGNOSIS — E039 Hypothyroidism, unspecified: Secondary | ICD-10-CM

## 2013-03-12 DIAGNOSIS — N189 Chronic kidney disease, unspecified: Secondary | ICD-10-CM

## 2013-03-12 DIAGNOSIS — R609 Edema, unspecified: Secondary | ICD-10-CM

## 2013-03-12 DIAGNOSIS — F419 Anxiety disorder, unspecified: Secondary | ICD-10-CM

## 2013-03-12 DIAGNOSIS — F329 Major depressive disorder, single episode, unspecified: Secondary | ICD-10-CM

## 2013-03-12 DIAGNOSIS — F22 Delusional disorders: Secondary | ICD-10-CM

## 2013-03-12 DIAGNOSIS — F0392 Unspecified dementia, unspecified severity, with psychotic disturbance: Secondary | ICD-10-CM

## 2013-03-12 MED ORDER — NAPROXEN SODIUM 220 MG PO TABS: ORAL_TABLET | ORAL | Status: DC

## 2013-03-12 MED ORDER — CALCIUM CARBONATE-VITAMIN D 500-200 MG-UNIT PO TABS: ORAL_TABLET | ORAL | Status: DC

## 2013-03-12 NOTE — Progress Notes (Signed)
Subjective:    Patient ID: Jenny [name], female    DOB: 1926-12-18, 79 y.o.   MRN: [id number]  HPI Hypertensive renal disease, benign  BUN 36 and creatinine 1.41on  lab work 03/10/13 Dementia  Continues show evidence of deteriorating memory. She can't keep her medications organized. She argues with her husband frequently. She has a high need to know where he is. She otherwise will go into anxiety attacks. Chronic kidney disease, stage III (moderate)  Stable Pressure ulcer, buttock(707.05)  Healed Hypothyroidism  Improved on supplementation Depressive disorder  Patient is return to same Dr. Emerson Monte regularly. The patient's medications have been adjusted several times. She sleeps very well on the current medications. Her husband describes her as more socially active. She has become more active in her housekeeping. Anxiety  Significant anxiety attacks occur when she is not sure where her husband is. Edema  Chronic swelling of both lower legs has improved. Fracture of superior pubic ramus, unspecified laterality, with routine healing, subsequent encounter  Continues to see the orthopedist. She has minimal discomfort with pain Senile dementia with delusional features  Memory loss persists. Psychiatric conditions appear to be doing somewhat better. She does not manifest any delusions today.    Review of Systems Constitutional: Negative for fever, chills, weight loss, malaise/fatigue and diaphoresis.  HENT: Positive for hearing loss. Negative for ear pain, congestion, sore throat and neck pain.   Eyes: Negative for pain, discharge and redness.  Respiratory: Negative for cough, hemoptysis, sputum production, shortness of breath and wheezing.   Cardiovascular: Negative for chest pain, palpitations, orthopnea, claudication, leg swelling and PND.  Gastrointestinal: Negative for heartburn, nausea, vomiting, abdominal pain, diarrhea, constipation and blood in stool.  Genitourinary:  Positive for frequency. Negative for dysuria, urgency, hematuria and flank pain.  Musculoskeletal: Positive for joint pain. Negative for myalgias and falls. There is chronic back discomfort Skin: Negative for itching and rash.  Neurological: Negative for dizziness, tingling, tremors, sensory change, speech change, focal weakness, seizures, loss of consciousness, weakness and headaches.  Endo/Heme/Allergies: Negative for environmental allergies and polydipsia. Does not bruise/bleed easily.  Psychiatric/Behavioral: Positive for depression and memory loss. Negative for hallucinations. The patient is nervous/anxious. The patient does not have insomnia     Objective:   Physical Exam BP 138/74  Pulse 60  Wt 158 lb (71.668 kg)  BMI 28.89 kg/m2 Physical Exam  Constitutional: She appears well-developed and well-nourished. No distress.  HENT:   Head: Normocephalic and atraumatic.   Mouth/Throat: No oropharyngeal exudate.  Eyes: EOM are normal. Pupils are equal, round, and reactive to light. Neck: Normal range of motion. Neck supple. No JVD present. No thyromegaly present.  Cardiovascular: Normal rate and regular rhythm. Systolic murmur is present with a grade of 3/6    No diastolic murmur is present  Pulmonary/Chest: Effort normal and breath sounds normal. No respiratory distress. She has no wheezes. She has no rales.  Abdominal: Soft. Bowel sounds are normal. She exhibits no distension. There is no tenderness.  Musculoskeletal: Normal range of motion. She exhibits no edema and no tenderness. Unstable gait. Using a walker.  Lymphadenopathy:    She has no cervical adenopathy.  Neurological: She is alert. She has normal reflexes. She displays normal reflexes. No cranial nerve deficit. She exhibits normal muscle tone. Coordination normal.  Skin: Skin is warm and dry. She is not diaphoretic. No erythema. Previous shallow sacral decubitus has healed.  Psychiatric: Her mood appears anxious. Her affect  is not angry, not blunt, not  labile and not inappropriate. Her speech is not delayed, not tangential and not slurred. She is not agitated, not aggressive, not hyperactive, not slowed, not withdrawn, not actively hallucinating and not combative. Thought content is not paranoid and not delusional. Cognition and memory are impaired. She expresses inappropriate judgment. She does not express impulsivity. She exhibits a depressed mood. She is communicative. She exhibits abnormal recent memory. She is attentive.    Laboratory reports 01/01/2012 [facility] labs Sodium 135, Potassium 4.4, BUN 23, Creatinine 1.14, glucose 83, Total Bil 0.2 CBC normal Troponin 0.01 02/12/2012 BMP: Sodium 133, Potassium 4.9, glucose 92, BUN 24, Creatinine 1.17 T4 8.7, TSH 1.132, T3 3.4 BNP 324.0 03/25/2012 CMP: Sodium 133, Potassium 4.5, glucose 84, UN 18, Creatinine 1.00, Total Protein 5.9 CBC: Rbc 4.20, Hgb 12.0, Hct 36.8, Platelet 414 PT 12.5, INR 0.90  (not on Coumadin) PTT 30 Lipid: cholesterol 184, triglyceride 105, HDL 58, LDL 105 TSH 0.476 02/19/80 EKG: rate 60. NSR. Normal 05/12/12 wbc 12.6, Hgb 9.5, Hct 27.4 10/2012 hospital: range  Na 119-125, K 4.7-5.7, glucose <125, Bun 22-24, creatinine 1.22-1.49, Ca 8.2-8.8 LFT AST 20, ALT 17, alk phos 134, biltot 0.2, protein 5.2, albumin 2.8 wbc 6.9-8.2, Hgb 9.3--11.0, HCT 28.3-32.2, Plt 390-405 08/14/12 CBC wbc 7.9, Hgb 10.5, Hct 31.3, plt 483 CMP Na 132, K 4.9, glucose 101, Bun 23, creatinine 1.20, Ca 8.5, LFT alk phos 139,                            total protein 5.4, albumin 3.3 TSH 2.275 10/18/12 CBC wbc 8.2, Hgb 10.2, Hct 30.1, plt 475 BNP 716.0 BMP Na 129, K 5.3, glucose 111, Bun 29, creatinine 1.34, Ca 8.7 11/11/2012 BMP: Sodium 131, Potassium 5.1, glucose 85, BUN 33, Creatinine 1.87 BNP 294.1    03/10/13 CBC: Hemoglobin 11.1, MCV 79.5   CMP: BUN 36, creatinine 1.41, otherwise normal   Lipids: Total cholesterol 164, triglycerides 106, HDL 50, LDL 93    TSH  1.220   Urine culture negative  Assessment & Plan:     ICD-9-CM  1. Hypertensive renal disease, benign Continue to follow with repeat lab work  403.10  2. Dementia Appears to be progressive.  294.20  3. Chronic kidney disease, stage III (moderate) Stable  585.3  4. Pressure ulcer, buttock(707.05) Healed  707.05  5. Hypothyroidism Improved on thyroid supplements  244.9  6. Depressive disorder Improved on current medications which are being monitored by Dr. Emerson Monte  311  7. Anxiety Chronic condition that is under reasonable control.  300.00  8. Edema Improved.  782.3  9. Fracture of superior pubic ramus, unspecified laterality, with routine healing, subsequent encounter Very little pain now. Continues to see orthopedist.  V54.13  10. Senile dementia with delusional features Progressive and likely Alzheimer's although vascular dementia cannot be ruled out at this point  290.20  We discussed ways to reduce her medication load during this visit. We're going to reduce her calcium tablets from 3 times daily down to 1 daily.   Since her pain control seems excellent, we have advised her that she may reduce Aleve to when necessary dosing.  Because of her evidence for elevations in BUN and creatinine that may be related at least in part to the use of diuretics, we have suggested that she reduce her furosemide to 80 mg daily. She has been using this at a strength of 80 mg twice daily despite previous reactions to only  use it once daily. She has no significant edema and no breathing issues related to fluid retention.

## 2013-03-12 NOTE — Patient Instructions (Addendum)
Reduce calcium tablets to one daily. Reduce furosemide to 80 each morning (once daily). Take Aleve only "as needed". You do not have to take it every morning.

## 2013-03-16 ENCOUNTER — Other Ambulatory Visit: Payer: Self-pay | Admitting: Geriatric Medicine

## 2013-03-16 MED ORDER — POTASSIUM CHLORIDE CRYS ER 20 MEQ PO TBCR: 20.0000 meq | EXTENDED_RELEASE_TABLET | Freq: Two times a day (BID) | ORAL | Status: DC

## 2013-03-26 ENCOUNTER — Encounter: Payer: Self-pay | Admitting: Internal Medicine

## 2013-04-29 ENCOUNTER — Encounter: Payer: Self-pay | Admitting: Internal Medicine

## 2013-05-06 ENCOUNTER — Encounter: Payer: Self-pay | Admitting: Neurology

## 2013-05-06 ENCOUNTER — Ambulatory Visit (INDEPENDENT_AMBULATORY_CARE_PROVIDER_SITE_OTHER): Payer: Medicare Other | Admitting: Neurology

## 2013-05-06 VITALS — BP 175/68 | HR 49 | Temp 97.5°F | Ht 62.5 in | Wt 153.0 lb

## 2013-05-06 DIAGNOSIS — R413 Other amnesia: Secondary | ICD-10-CM

## 2013-05-06 DIAGNOSIS — Z79899 Other long term (current) drug therapy: Secondary | ICD-10-CM

## 2013-05-06 NOTE — Progress Notes (Signed)
Subjective:    Patient ID: Jenny [name] is a 79 y.o. female.  HPI  Interim history:   Jenny [name] is a very friendly 79 year old right-handed woman who presents for followup consultation of herMemory loss. She is accompanied by her husband and her daughter, Delray Alt, today. This is her first visit with me and she previously followed with Dr. Fayrene Fearing love and was last seen by him on 12/24/2012, at which time he felt that she had progressed in her memory loss. Her MMSE at that time was 23, down from 27. He restarted her on donepezil at the time. Her falls assessment tool score was 12 at the time. She has an underlying medical history of heart disease, stroke, chronic back pain, depression, thyroid disease and hypertension. She is currently on donepezil 5 mg daily, trazodone 50 mg, Combigan, Restasis, MiraLax, Aleve, senna, omeprazole, potassium, Lasix, Lamictal 200 mg at bedtime, Synthroid, baby aspirin, Xanax 0.25 mg strength half a tablet 3 times a day, vitamin C, Zoloft 100 mg daily, calcium and vitamin D and Bystolic.   I reviewed Dr. Imagene Gurney prior notes and the patient's records and below is a summary of that review of:  79 year old right-handed woman with a history of bipolar affective disorder, dizziness, memory loss, anxiety, TIA, migraines, who has had memory loss. MRI brain with and without contrast in August 2004 showed chronic small vessel disease including the pons and cerebral deep white matter and frontotemporal atrophy. MRI of the brain with and without contrast dated May 2009 showed atrophy, prominent frontally and temporally, right greater than left with moderate nonspecific periventricular and subcortical white matter disease most consistent with small vessel ischemia. Carotid Doppler study in February 2013 showed mild left he see a stenosis, retrograde flow in the left vertebral artery and high-grade left proximal subclavian stenosis. This was not much different from March 2010.  Echocardiogram from June 2004 showed normal left ventricular function and minimal mitral valve prolapse. [facility] lives [facility] with her husband. She has had problems with word finding and naming. She had experienced depression and nausea with the Aricept. She has a history of back pain. MRI L-spine without contrast in March 2011 showed right-sided disease at L2-3, L3-4 and L4-5 with right-sided disease at L1-2. She had spinal canal stenosis at L3-4 and L4-5. She has been through physical therapy twice and uses a TENS unit. She has no bowel or bladder incontinence. She walks with a walker and a cane. She had improvement of her symptoms after epidural steroid injections. She sees an orthopedic doctor for right knee pain. In February 2013 her MMSE was 27, clock drawing was 4, and a multiple latency was 13. In February 2014 her MMSE was 23, clock drawing was 4, animal fluency was 14. She had TD with facial dyskinesias in February and was taking off of Abilify. She is on an increased dose of Zoloft now and was started on clonopin from Xanax in order to help with the TD, but it has not helped. Her daughter reports that she has had more confusion and disorientation. She had blood work recently on 03/10/2013 which I reviewed. Her BUN was 36 and her creatinine was 1.41, both mildly elevated. Her sodium is slightly low at 134, her TSH was normal. When she was retried on low-dose Aricept she was not able to tolerate it. Dr. love and tried her on Exelon patch which she could not tolerate either. It made her very confused. She drinks a lot of caffeine  and sodas, particularly Diet Coke.  Her Past Medical History Is Significant For: Past Medical History  Diagnosis Date  . Hypertension   . Coronary artery disease   . Heart murmur   . Hypothyroidism 05-02-12    tx. Synthroid  . Headache(784.0) 05-02-12    past hx. migrianes-many yrs ago  . Arthritis 05-02-12    osteoarthritis-rt. hip  . Pneumonia 05-02-12     mild case  . 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 Acute upper respiratory infections of unspecified site 2014  . Unspecified venous (peripheral) insufficiency 2013  . Senile osteoporosis 2013  . Unspecified closed fracture of pelvis 10/13/2012  . Cellulitis and abscess of leg, except foot 2013  . Altered mental status 2013  . Chronic pain syndrome 2013  . Urinary tract infection, site not specified   . Hyposmolality and/or hyponatremia 05/2012  . Congestive heart failure, unspecified 05/2012  . Pneumonia, organism unspecified 05/2012  . Pressure ulcer, buttock(707.05) 05/2012  . Hypoxemia 05/2012  . Reflux esophagitis 3013  . Unspecified constipation 2013  . Acute posthemorrhagic anemia 05/2012  . Coronary atherosclerosis of native coronary artery 05/2012  . Pain in joint, pelvic region and thigh 04/2012  . Dysphagia, unspecified(787.20) 03/2012  . Benign neoplasm of adrenal gland 10/2011  . Alzheimer's disease 06/2011  . Other seborrheic keratosis 2012  . Hypertensive renal disease, benign 2012  . Pain in joint, lower leg 2012  . Memory loss 09/2010  . Spinal stenosis, lumbar region, without neurogenic claudication 2011  . Unspecified vitamin D deficiency 2010  . Unspecified disorder of kidney and ureter 2010  . Edema 2010  . Other malaise and fatigue 2009  . Congenital factor VIII disorder 2009  . Senile dementia with delusional features 2008  . Generalized hyperhidrosis 2007  . Irritable bowel syndrome 2007  . Unspecified chronic bronchitis 2006  . Depression 06/2005     depression only,tx. oral meds  . Shortness of breath 2005  . Anxiety 2004  . Unspecified late effects of cerebrovascular disease 2004  . Migraine with aura, without mention of intractable migraine without mention of status migrainosus 2003  . Tinnitus 2003  . Other symptoms involving cardiovascular system 2003  . Hyperlipidemia 2002    Her Past Surgical History Is Significant For: Past Surgical History  Procedure  Laterality Date  . Tonsillectomy    . Tubal ligation  05-02-12  .  tummy tuck  05-02-12    many yrs ago  . Cataract extraction, bilateral  05-02-12    bilateral  . Retinal detachment surgery  05-02-12    left  . Total hip arthroplasty  05/09/2012    Procedure: TOTAL HIP ARTHROPLASTY ANTERIOR APPROACH;  Surgeon: Kathryne Hitch, MD;  Location: [facility];  Service: Orthopedics;  Laterality: Right;    Her Family History Is Significant For: Family History  Problem Relation Age of Onset  . Diabetes Father     Her Social History Is Significant For: History   Social History  . Marital Status: Married    Spouse Name: N/A    Number of Children: N/A  . Years of Education: N/A   Social History Main Topics  . Smoking status: Former Smoker    Quit date: 10/10/2008  . Smokeless tobacco: Never Used  . Alcohol Use: No  . Drug Use: No  . Sexually Active: No   Other Topics Concern  . None   Social History Narrative  . None    Her Allergies Are:  Allergies  Allergen Reactions  . Penicillins Rash  :   Her Current Medications Are:  Outpatient Encounter Prescriptions as of 05/06/2013  Medication Sig Dispense Refill  . acetaminophen (TYLENOL) 325 MG tablet Take 325 mg by mouth. One daily      . Ascorbic Acid (VITAMIN C) 1000 MG tablet Take 1,000 mg by mouth daily with breakfast.      . aspirin EC 81 MG tablet Take 81 mg by mouth daily.      . brimonidine-timolol (COMBIGAN) 0.2-0.5 % ophthalmic solution Place 1 drop into both eyes every 12 (twelve) hours.        . calcitonin, salmon, (MIACALCIN/FORTICAL) 200 UNIT/ACT nasal spray Place 1 spray into the nose daily.  3.7 mL  0  . calcium-vitamin D (OSCAL WITH D) 500-200 MG-UNIT per tablet Take one tablet daily for calcium supplementation      . clonazePAM (KLONOPIN) 0.5 MG tablet Take 0.25 mg by mouth 3 (three) times daily as needed. Take one three times a day      . cycloSPORINE (RESTASIS) 0.05 % ophthalmic emulsion Place 1 drop into  both eyes 2 (two) times daily.      . furosemide (LASIX) 80 MG tablet Take 1 tablet (80 mg total) by mouth daily with breakfast.      . lamoTRIgine (LAMICTAL) 200 MG tablet Take 200 mg by mouth daily.      Marland Kitchen levothyroxine (SYNTHROID, LEVOTHROID) 88 MCG tablet Take 88 mcg by mouth daily with breakfast. For thyroid      . multivitamin-lutein (OCUVITE-LUTEIN) CAPS Take 1 capsule by mouth daily.      . nebivolol (BYSTOLIC) 10 MG tablet Take 10 mg by mouth daily with breakfast. To control blood pressure      . potassium chloride SA (K-DUR,KLOR-CON) 20 MEQ tablet Take 1 tablet (20 mEq total) by mouth 2 (two) times daily. Take one twice daily  60 tablet  3  . senna (SENOKOT) 8.6 MG TABS Take 2 tablets by mouth at bedtime.      . sertraline (ZOLOFT) 100 MG tablet Take 100 mg by mouth daily.       . traZODone (DESYREL) 50 MG tablet 50 mg. Take one tablet at bedtime      . naproxen sodium (ANAPROX) 220 MG tablet Take one tablet up to 3 times daily if needed for pain      . [DISCONTINUED] ARIPiprazole (ABILIFY) 5 MG tablet Take 5 mg by mouth at bedtime.      . [DISCONTINUED] polyethylene glycol (MIRALAX / GLYCOLAX) packet Take 17 g by mouth daily.      . [DISCONTINUED] zaleplon (SONATA) 10 MG capsule Take 10 mg by mouth at bedtime as needed. For insomnia       No facility-administered encounter medications on file as of 05/06/2013.   Review of Systems  Constitutional: Positive for fatigue and unexpected weight change.  HENT: Positive for tinnitus.   Cardiovascular: Positive for leg swelling.  Gastrointestinal: Positive for constipation.  Endocrine: Positive for cold intolerance and polydipsia.  Neurological: Positive for tremors, speech difficulty and weakness.       Memory loss  Hematological: Bruises/bleeds easily.  Psychiatric/Behavioral: Positive for confusion and dysphoric mood. The patient is nervous/anxious.     Objective:  Neurologic Exam  Physical Exam Physical Examination:   Filed  Vitals:   05/06/13 1133  BP: 175/68  Pulse: 49  Temp: 97.5 F (36.4 C)    General Examination: The patient is a very pleasant 79 y.o. female in  no acute distress. She is calm and cooperative with the exam. She denies Auditory Hallucinations and Visual Hallucinations.   HEENT: Normocephalic, atraumatic, pupils are equal, round and reactive to light and accommodation. Funduscopic exam is normal with sharp disc margins noted. Extraocular tracking shows mild saccadic breakdown without nystagmus noted. Hearing is impaired mildly. Tympanic membranes are clear bilaterally. Face is symmetric with no facial masking and normal facial sensation. There is no lip, neck or jaw tremor. Neck is mildly rigid with intact passive ROM. There are no carotid bruits on auscultation. Oropharynx exam reveals moderate mouth dryness. No significant airway crowding is noted. Mallampati is class II. Tongue protrudes centrally and palate elevates symmetrically.    Chest: is clear to auscultation without wheezing, rhonchi or crackles noted.  Heart: sounds are regular and normal without murmurs, rubs or gallops noted.   Abdomen: is soft, non-tender and non-distended with normal bowel sounds appreciated on auscultation.  Extremities: There is no pitting edema in the distal lower extremities bilaterally. Pedal pulses are intact.  Skin: is warm and dry with no trophic changes noted.  Musculoskeletal: exam reveals no obvious joint deformities, tenderness or joint swelling or erythema.  Neurologically:  Mental status: The patient is awake and alert, paying good  attention. She is able to partially provide the history. Her daughter and husband provide details. She is oriented to: place, time/date, situation, day of week, month of year and year. Her memory, attention, language and knowledge are impaired. There is no aphasia, agnosia, apraxia or anomia. There is a mild degree of bradyphrenia. Speech is mildly hypophonic with mild  dysarthria noted. Mood is congruent and affect is blunted.   Her MMSE was not repeated today.  Motor exam: Normal bulk, and strength for age is noted. Tone is Not rigid with absence of cogwheeling in the extremities. There is overall mild psychomotor retardation. there is no drift or rebound. There is no tremor. She has mild oral facial dyskinesias in particular with lipsmacking. Reflexes are 1+ in the upper extremities and 1+ in the lower extremities. Toes are downgoing bilaterally. Fine motor skills: Finger taps, hand movements, and rapid alternating patting are mildly impaired bilaterally. Foot taps and foot agility are mildly impaired bilaterally.   Cerebellar testing shows no dysmetria or intention tremor on finger to nose testing. Heel to shin is unremarkable. There is no truncal or gait ataxia.   Sensory exam is intact to light touch, pinprick, vibration, temperature sense and proprioception in the upper and lower extremities.   Gait, station and balance: She stands up from the seated position with mild difficulty and needs some assistance. No veering to one side is noted. No leaning to one side. Posture is mildly stooped. Stance is wide-based. She is able to walk without her walker for a few steps but walks cautiously. With a walker she walks more securely. She turns in 3 steps. Tandem walk is not possible. Balance is mildly impaired.     Assessment and Plan:   Assessment and Plan:  In summary, Jenny [name] is a very pleasant 79 y.o.-year old female with a history of memory loss, her history is confounded by her long-standing history of severe depression which has flared up recently. Her history is also confounded by multiple medications. She was not able to tolerate the additional trial of Aricept and also Exelon patch was not tolerated. At this juncture I would like to hold off on any additional memory medication but would like to see if she can  reduce some of her psychotropic  medications. In particular I would like to see if she can come off of clonazepam and reduce the dose of her Lamictal. I would like to reevaluate her in 3 months and at that visit we can revisit trying her on another memory drug.   I had a long chat with the patient and her family about my findings and the diagnosis of memory loss, its prognosis and treatment options. Implications of diagnosis explained at length with the patient and caregiver. We talked about medical treatments and non-pharmacological approaches. We talked about maintaining a healthy lifestyle in general and staying active mentally and physically. I encouraged the patient to eat healthy, exercise daily and keep well hydrated, to keep a scheduled bedtime and wake time routine, to not skip any meals and eat healthy snacks in between meals and to have protein with every meal. I stressed the importance of regular exercise, within of course the patient's own mobility limitations. I encouraged the patient to keep up with current events by reading the news paper or watching the news. She is advised to stop drinking diet sodas and encouraged to drink more water. I answered all their questions today and the patient and her family were in agreement with the above outlined plan. I would like to see the patient back in 3 months, sooner if the need arises and encouraged them to call with any interim questions, concerns, problems, updates.

## 2013-05-06 NOTE — Patient Instructions (Addendum)
I think overall you are doing fairly well but I do want to suggest a few things today:  Remember to drink plenty of fluid, eat healthy meals and do not skip any meals. Try to eat protein with a every meal and eat a healthy snack such as fruit or nuts in between meals. Try to keep a regular sleep-wake schedule and try to exercise daily, particularly in the form of walking with your walker, 20 minutes a day, if you can, starting with 5-10 minutes.   Engage in social activities in your community and with your family and try to keep up with current events by reading the newspaper or watching the news.   As far as your medications are concerned, I would like to suggest no changes. We may try you on another memory drug down the road. Talk with your psychiatrist about coming off of clonazepam and reducing the lamictal.    As far as diagnostic testing: no new test from my end.  I would like to see you back in 3 months, sooner if we need to. Please call us with any interim questions, concerns, problems, updates or refill requests.  Brett Canales is my clinical assistant and will answer any of your questions and relay your messages to me and also relay most of my messages to you.  Our phone number is [phone]. We also have an after hours call service for urgent matters and there is a physician on-call for urgent questions. For any emergencies you know to call 911 or go to the nearest emergency room.

## 2013-05-07 ENCOUNTER — Emergency Department (HOSPITAL_COMMUNITY): Payer: Medicare Other

## 2013-05-07 ENCOUNTER — Emergency Department (HOSPITAL_COMMUNITY)
Admission: EM | Admit: 2013-05-07 | Discharge: 2013-05-07 | Disposition: A | Discharge status: Skilled Nursing Facility | Payer: Medicare Other | Attending: Emergency Medicine | Admitting: Emergency Medicine

## 2013-05-07 ENCOUNTER — Encounter (HOSPITAL_COMMUNITY): Payer: Self-pay | Admitting: Emergency Medicine

## 2013-05-07 DIAGNOSIS — E785 Hyperlipidemia, unspecified: Secondary | ICD-10-CM | POA: Insufficient documentation

## 2013-05-07 DIAGNOSIS — IMO0002 Reserved for concepts with insufficient information to code with codable children: Secondary | ICD-10-CM | POA: Insufficient documentation

## 2013-05-07 DIAGNOSIS — M169 Osteoarthritis of hip, unspecified: Secondary | ICD-10-CM | POA: Insufficient documentation

## 2013-05-07 DIAGNOSIS — Z8781 Personal history of (healed) traumatic fracture: Secondary | ICD-10-CM | POA: Insufficient documentation

## 2013-05-07 DIAGNOSIS — G309 Alzheimer's disease, unspecified: Secondary | ICD-10-CM | POA: Insufficient documentation

## 2013-05-07 DIAGNOSIS — R011 Cardiac murmur, unspecified: Secondary | ICD-10-CM | POA: Insufficient documentation

## 2013-05-07 DIAGNOSIS — G894 Chronic pain syndrome: Secondary | ICD-10-CM | POA: Insufficient documentation

## 2013-05-07 DIAGNOSIS — M25519 Pain in unspecified shoulder: Secondary | ICD-10-CM | POA: Insufficient documentation

## 2013-05-07 DIAGNOSIS — Z7982 Long term (current) use of aspirin: Secondary | ICD-10-CM | POA: Insufficient documentation

## 2013-05-07 DIAGNOSIS — I509 Heart failure, unspecified: Secondary | ICD-10-CM | POA: Insufficient documentation

## 2013-05-07 DIAGNOSIS — Z87891 Personal history of nicotine dependence: Secondary | ICD-10-CM | POA: Insufficient documentation

## 2013-05-07 DIAGNOSIS — F411 Generalized anxiety disorder: Secondary | ICD-10-CM | POA: Insufficient documentation

## 2013-05-07 DIAGNOSIS — F0390 Unspecified dementia without behavioral disturbance: Secondary | ICD-10-CM | POA: Insufficient documentation

## 2013-05-07 DIAGNOSIS — F329 Major depressive disorder, single episode, unspecified: Secondary | ICD-10-CM | POA: Insufficient documentation

## 2013-05-07 DIAGNOSIS — M1610 Unilateral primary osteoarthritis, unspecified hip: Secondary | ICD-10-CM | POA: Insufficient documentation

## 2013-05-07 DIAGNOSIS — Z8669 Personal history of other diseases of the nervous system and sense organs: Secondary | ICD-10-CM | POA: Insufficient documentation

## 2013-05-07 DIAGNOSIS — Z8679 Personal history of other diseases of the circulatory system: Secondary | ICD-10-CM | POA: Insufficient documentation

## 2013-05-07 DIAGNOSIS — Z8709 Personal history of other diseases of the respiratory system: Secondary | ICD-10-CM | POA: Insufficient documentation

## 2013-05-07 DIAGNOSIS — F0280 Dementia in other diseases classified elsewhere without behavioral disturbance: Secondary | ICD-10-CM | POA: Insufficient documentation

## 2013-05-07 DIAGNOSIS — Z79899 Other long term (current) drug therapy: Secondary | ICD-10-CM | POA: Insufficient documentation

## 2013-05-07 DIAGNOSIS — Z8719 Personal history of other diseases of the digestive system: Secondary | ICD-10-CM | POA: Insufficient documentation

## 2013-05-07 DIAGNOSIS — Z87440 Personal history of urinary (tract) infections: Secondary | ICD-10-CM | POA: Insufficient documentation

## 2013-05-07 DIAGNOSIS — Z8639 Personal history of other endocrine, nutritional and metabolic disease: Secondary | ICD-10-CM | POA: Insufficient documentation

## 2013-05-07 DIAGNOSIS — Z862 Personal history of diseases of the blood and blood-forming organs and certain disorders involving the immune mechanism: Secondary | ICD-10-CM | POA: Insufficient documentation

## 2013-05-07 DIAGNOSIS — Z8701 Personal history of pneumonia (recurrent): Secondary | ICD-10-CM | POA: Insufficient documentation

## 2013-05-07 DIAGNOSIS — E039 Hypothyroidism, unspecified: Secondary | ICD-10-CM | POA: Insufficient documentation

## 2013-05-07 DIAGNOSIS — I2510 Atherosclerotic heart disease of native coronary artery without angina pectoris: Secondary | ICD-10-CM | POA: Insufficient documentation

## 2013-05-07 DIAGNOSIS — Z880 Allergy status to penicillin: Secondary | ICD-10-CM | POA: Insufficient documentation

## 2013-05-07 DIAGNOSIS — Z8739 Personal history of other diseases of the musculoskeletal system and connective tissue: Secondary | ICD-10-CM | POA: Insufficient documentation

## 2013-05-07 DIAGNOSIS — F3289 Other specified depressive episodes: Secondary | ICD-10-CM | POA: Insufficient documentation

## 2013-05-07 DIAGNOSIS — Z872 Personal history of diseases of the skin and subcutaneous tissue: Secondary | ICD-10-CM | POA: Insufficient documentation

## 2013-05-07 DIAGNOSIS — M25511 Pain in right shoulder: Secondary | ICD-10-CM

## 2013-05-07 DIAGNOSIS — I10 Essential (primary) hypertension: Secondary | ICD-10-CM | POA: Insufficient documentation

## 2013-05-07 MED ORDER — OXYCODONE-ACETAMINOPHEN 5-325 MG PO TABS
1.0000 | ORAL_TABLET | Freq: Once | ORAL | Status: AC
  Administered 2013-05-07: 1 via ORAL
  Filled 2013-05-07: qty 1

## 2013-05-07 MED ORDER — TRAMADOL HCL 50 MG PO TABS: 50.0000 mg | ORAL_TABLET | Freq: Four times a day (QID) | ORAL | Status: DC | PRN

## 2013-05-07 MED ORDER — IBUPROFEN 200 MG PO TABS
600.0000 mg | ORAL_TABLET | Freq: Once | ORAL | Status: AC
  Administered 2013-05-07: 600 mg via ORAL
  Filled 2013-05-07: qty 3

## 2013-05-07 MED ORDER — PREDNISONE 20 MG PO TABS: 20.0000 mg | ORAL_TABLET | Freq: Every day | ORAL | Status: DC

## 2013-05-07 NOTE — ED Notes (Signed)
Pt with hx of TIA's and daughter asking for that to be ruled out.

## 2013-05-07 NOTE — ED Notes (Signed)
Family reports increased confusion over past couple months. PMH dementia.  Saw neurologist 2 days ago.

## 2013-05-07 NOTE — ED Notes (Signed)
Bed:WA07<BR> Expected date:<BR> Expected time:<BR> Means of arrival:<BR> Comments:<BR>

## 2013-05-07 NOTE — ED Notes (Signed)
Per EMS pt from nursing home and c/o of right shoulder pain that radiates down arm with pain 8/10. Range of Motion and pulses intact. Stroke scale negative. EKG Normal by EMS. Hr 53, BP 184/90. History of Stroke. Denies Fall or injury. Pain started a day ago.

## 2013-05-07 NOTE — Progress Notes (Signed)
CSW from Independent living at [facility]. Pt plans to return to independent apartment with pt spouse when medically stable.   Catha Gosselin, LCSWA  [phone] 05/07/2013 1422pm

## 2013-05-08 NOTE — ED Provider Notes (Signed)
History    CSN: [id number] Arrival date & time 05/07/13  1150  First MD Initiated Contact with Patient 05/07/13 1215     Chief Complaint  Patient presents with  . Arm Pain   (Consider location/radiation/quality/duration/timing/severity/associated sxs/prior Treatment) HPI.... level by caveat for dementia.   Sharp Right shoulder pain for several weeks.   No radicular symptoms. No head or neck pain. Patient uses a walker and puts extra stress on her shoulders.  Severity is mild to moderate.  Palpation makes pain worse. Past Medical History  Diagnosis Date  . Hypertension   . Coronary artery disease   . Heart murmur   . Hypothyroidism 05-02-12    tx. Synthroid  . Headache(784.0) 05-02-12    past hx. migrianes-many yrs ago  . Arthritis 05-02-12    osteoarthritis-rt. hip  . Pneumonia 05-02-12    mild case  . 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 Acute upper respiratory infections of unspecified site 2014  . Unspecified venous (peripheral) insufficiency 2013  . Senile osteoporosis 2013  . Unspecified closed fracture of pelvis 10/13/2012  . Cellulitis and abscess of leg, except foot 2013  . Altered mental status 2013  . Chronic pain syndrome 2013  . Urinary tract infection, site not specified   . Hyposmolality and/or hyponatremia 05/2012  . Congestive heart failure, unspecified 05/2012  . Pneumonia, organism unspecified 05/2012  . Pressure ulcer, buttock(707.05) 05/2012  . Hypoxemia 05/2012  . Reflux esophagitis 3013  . Unspecified constipation 2013  . Acute posthemorrhagic anemia 05/2012  . Coronary atherosclerosis of native coronary artery 05/2012  . Pain in joint, pelvic region and thigh 04/2012  . Dysphagia, unspecified(787.20) 03/2012  . Benign neoplasm of adrenal gland 10/2011  . Alzheimer's disease 06/2011  . Other seborrheic keratosis 2012  . Hypertensive renal disease, benign 2012  . Pain in joint, lower leg 2012  . Memory loss 09/2010  . Spinal stenosis, lumbar region, without  neurogenic claudication 2011  . Unspecified vitamin D deficiency 2010  . Unspecified disorder of kidney and ureter 2010  . Edema 2010  . Other malaise and fatigue 2009  . Congenital factor VIII disorder 2009  . Senile dementia with delusional features 2008  . Generalized hyperhidrosis 2007  . Irritable bowel syndrome 2007  . Unspecified chronic bronchitis 2006  . Depression 06/2005     depression only,tx. oral meds  . Shortness of breath 2005  . Anxiety 2004  . Unspecified late effects of cerebrovascular disease 2004  . Migraine with aura, without mention of intractable migraine without mention of status migrainosus 2003  . Tinnitus 2003  . Other symptoms involving cardiovascular system 2003  . Hyperlipidemia 2002   Past Surgical History  Procedure Laterality Date  . Tonsillectomy    . Tubal ligation  05-02-12  .  tummy tuck  05-02-12    many yrs ago  . Cataract extraction, bilateral  05-02-12    bilateral  . Retinal detachment surgery  05-02-12    left  . Total hip arthroplasty  05/09/2012    Procedure: TOTAL HIP ARTHROPLASTY ANTERIOR APPROACH;  Surgeon: Kathryne Hitch, MD;  Location: [facility];  Service: Orthopedics;  Laterality: Right;   Family History  Problem Relation Age of Onset  . Diabetes Father    History  Substance Use Topics  . Smoking status: Former Smoker    Quit date: 10/10/2008  . Smokeless tobacco: Never Used  . Alcohol Use: No   OB History   Grav Para Term Preterm Abortions  TAB SAB Ect Mult Living                 Review of Systems  Unable to perform ROS: Dementia    Allergies  Penicillins  Home Medications   Current Outpatient Rx  Name  Route  Sig  Dispense  Refill  . acetaminophen (TYLENOL) 325 MG tablet   Oral   Take 325 mg by mouth. One daily         . Ascorbic Acid (VITAMIN C) 1000 MG tablet   Oral   Take 1,000 mg by mouth daily with breakfast.         . aspirin EC 81 MG tablet   Oral   Take 81 mg by mouth daily.          . brimonidine-timolol (COMBIGAN) 0.2-0.5 % ophthalmic solution   Both Eyes   Place 1 drop into both eyes every 12 (twelve) hours.           . calcitonin, salmon, (MIACALCIN/FORTICAL) 200 UNIT/ACT nasal spray   Nasal   Place 1 spray into the nose daily.   3.7 mL   0   . calcium-vitamin D (OSCAL WITH D) 500-200 MG-UNIT per tablet      Take one tablet daily for calcium supplementation         . clonazePAM (KLONOPIN) 0.5 MG tablet   Oral   Take 0.25 mg by mouth 3 (three) times daily as needed. Take one three times a day         . furosemide (LASIX) 80 MG tablet   Oral   Take 1 tablet (80 mg total) by mouth daily with breakfast.         . lamoTRIgine (LAMICTAL) 200 MG tablet   Oral   Take 200 mg by mouth daily.         Marland Kitchen levothyroxine (SYNTHROID, LEVOTHROID) 88 MCG tablet   Oral   Take 88 mcg by mouth daily with breakfast. For thyroid         . multivitamin-lutein (OCUVITE-LUTEIN) CAPS   Oral   Take 1 capsule by mouth daily.         . naproxen sodium (ANAPROX) 220 MG tablet      Take one tablet up to 3 times daily if needed for pain         . nebivolol (BYSTOLIC) 10 MG tablet   Oral   Take 10 mg by mouth daily with breakfast. To control blood pressure         . potassium chloride SA (K-DUR,KLOR-CON) 20 MEQ tablet   Oral   Take 1 tablet (20 mEq total) by mouth 2 (two) times daily. Take one twice daily   60 tablet   3     Dispense as written.   . senna (SENOKOT) 8.6 MG TABS   Oral   Take 2 tablets by mouth at bedtime.         . sertraline (ZOLOFT) 100 MG tablet   Oral   Take 100 mg by mouth daily.          . traZODone (DESYREL) 50 MG tablet      50 mg. Take one tablet at bedtime         . cycloSPORINE (RESTASIS) 0.05 % ophthalmic emulsion   Both Eyes   Place 1 drop into both eyes 2 (two) times daily.         . predniSONE (DELTASONE) 20 MG tablet   Oral  Take 1 tablet (20 mg total) by mouth daily.   10 tablet   0   .  PRESCRIPTION MEDICATION      3-4 capsules once. Pt took 3-4 clindamycin before surgery on 04/30/2013 once. Pharmacy did not have record of this-per patient and spouse         . traMADol (ULTRAM) 50 MG tablet   Oral   Take 1 tablet (50 mg total) by mouth every 6 (six) hours as needed for pain.   30 tablet   0    BP 178/91  Pulse 55  Temp(Src) 98.5 F (36.9 C) (Oral)  Resp 16  Wt 153 lb (69.4 kg)  BMI 27.52 kg/m2  SpO2 96% Physical Exam  Nursing note and vitals reviewed. Constitutional: She is oriented to person, place, and time. She appears well-developed and well-nourished.  HENT:  Head: Normocephalic and atraumatic.  Eyes: Conjunctivae and EOM are normal. Pupils are equal, round, and reactive to light.  Neck: Normal range of motion. Neck supple.  Cardiovascular: Normal rate, regular rhythm and normal heart sounds.   Pulmonary/Chest: Effort normal and breath sounds normal.  Abdominal: Soft. Bowel sounds are normal.  Musculoskeletal:  Tender surrounding the right rotator cuff.  Pain with range of motion, especially abduction  Neurological: She is alert and oriented to person, place, and time.  Skin: Skin is warm and dry.  Psychiatric: She has a normal mood and affect.  Slight dementia    ED Course  Procedures (including critical care time) Labs Reviewed - No data to display Dg Shoulder Right  05/07/2013   *RADIOLOGY REPORT*  Clinical Data: Arm pain since last night, no known injury.  RIGHT SHOULDER - 2+ VIEW  Comparison: None.  Findings:  No definite fracture or dislocation.  There is moderate to severe degenerative change of the right glenohumeral joint with joint space loss, subchondral sclerosis inferiorly-directed osteophytosis.  Grossly normal appearance of the acromioclavicular joint.  No definite evidence of calcific tendonitis.  Limited visualization of the adjacent thorax demonstrates atherosclerotic calcifications within the thoracic aorta.  Regional soft tissues  are normal.  IMPRESSION: 1.  No acute findings. 2.  Moderate to severe degenerative changes of the right glenohumeral joint.   Original Report Authenticated By: Tacey Ruiz, MD   1. Right shoulder pain     MDM  History and physical consistent with musculoskeletal pain, not CVA or cardiac related pain.  X-ray right shoulder shows severe degenerative changes in the glenohumeral joint  Donnetta Hutching, MD 05/08/13 1910

## 2013-05-19 ENCOUNTER — Other Ambulatory Visit: Payer: Self-pay | Admitting: Internal Medicine

## 2013-05-23 ENCOUNTER — Other Ambulatory Visit: Payer: Self-pay | Admitting: Internal Medicine

## 2013-06-04 ENCOUNTER — Non-Acute Institutional Stay: Payer: Medicare Other | Admitting: Internal Medicine

## 2013-06-04 ENCOUNTER — Encounter: Payer: Self-pay | Admitting: Internal Medicine

## 2013-06-04 VITALS — BP 142/86 | HR 67 | Ht 62.0 in | Wt 153.0 lb

## 2013-06-04 DIAGNOSIS — R0602 Shortness of breath: Secondary | ICD-10-CM

## 2013-06-04 DIAGNOSIS — M25519 Pain in unspecified shoulder: Secondary | ICD-10-CM

## 2013-06-04 DIAGNOSIS — M25511 Pain in right shoulder: Secondary | ICD-10-CM

## 2013-06-04 NOTE — Progress Notes (Signed)
Subjective:    Patient ID: Jenny [name], female    DOB: [birth year], 79 y.o.   MRN: [id number]  HPI Dyspnea. Getting worse for several weeks and perhaps months. In thinking back over her history, it appears that she was short of breath when she stayed in the skilled nursing facility in [facility] several months ago. Exertion is the triggering factor. She is short of breath after walking 100 feet or less. She denies cough, chest pain, dizziness, sputum production, nocturnal fevers.  Right shoulder pain. Went to ER 1 month ago. They advised her to call Dr. Magnus Ivan, ortho. She has not done this. There is pain with raising the right arm and with rotation of the shoulder. Current Outpatient Prescriptions on File Prior to Visit  Medication Sig Dispense Refill  . acetaminophen (TYLENOL) 325 MG tablet Take 325 mg by mouth. One daily as needed      . Ascorbic Acid (VITAMIN C) 1000 MG tablet Take 1,000 mg by mouth daily with breakfast.      . aspirin EC 81 MG tablet Take 81 mg by mouth daily.      . brimonidine-timolol (COMBIGAN) 0.2-0.5 % ophthalmic solution Place 1 drop into both eyes every 12 (twelve) hours.        Marland Kitchen BYSTOLIC 10 MG tablet TAKE (1) TABLET DAILY FOR HIGH BLOOD PRESSURE.  30 tablet  3  . clonazePAM (KLONOPIN) 0.5 MG tablet Take 0.25 mg by mouth 3 (three) times daily as needed. Take one three times a day      . cycloSPORINE (RESTASIS) 0.05 % ophthalmic emulsion Place 1 drop into both eyes 2 (two) times daily.      . furosemide (LASIX) 40 MG tablet TAKE 1 TABLET TWICE DAILY TO PREVENT FLUID RETENTION.  60 tablet  3  . lamoTRIgine (LAMICTAL) 200 MG tablet Take 200 mg by mouth daily.      Marland Kitchen levothyroxine (SYNTHROID, LEVOTHROID) 88 MCG tablet Take 88 mcg by mouth daily with breakfast. For thyroid      . multivitamin-lutein (OCUVITE-LUTEIN) CAPS Take 1 capsule by mouth daily.      . potassium chloride SA (K-DUR,KLOR-CON) 20 MEQ tablet Take 1 tablet (20 mEq total) by mouth 2 (two)  times daily. Take one twice daily  60 tablet  3  . PRESCRIPTION MEDICATION 3-4 capsules once. Pt took 3-4 clindamycin before surgery on 04/30/2013 once. Pharmacy did not have record of this-per patient and spouse      . senna (SENOKOT) 8.6 MG TABS Take 2 tablets by mouth at bedtime.      . sertraline (ZOLOFT) 100 MG tablet Take 50 mg by mouth daily.       . traZODone (DESYREL) 50 MG tablet 50 mg. Take one tablet at bedtime       No current facility-administered medications on file prior to visit.      Review of Systems  Constitutional: Negative for fever, chills, appetite change and unexpected weight change.  HENT: Positive for hearing loss.   Eyes: Negative.   Respiratory: Positive for shortness of breath. Negative for cough, choking, chest tightness and wheezing.   Cardiovascular: Negative for chest pain, palpitations and leg swelling.  Endocrine: Negative.   Musculoskeletal: Positive for myalgias, back pain, arthralgias and gait problem.  Skin:       Multiple senile ecchymoses.  Neurological: Negative for dizziness, tremors, seizures, facial asymmetry, speech difficulty, weakness and numbness.       Progressive memory loss is present.  Hematological: Negative.  Psychiatric/Behavioral:       History of bipolar disorder, with depression being the dominant affect usually. Next chronic anxiety and nervousness. She is confused because of her progressing dementia. Her episodes of agitation.       Objective:BP 142/86  Pulse 67  Ht 5\' 2"  (1.575 m)  Wt 153 lb (69.4 kg)  BMI 27.98 kg/m2  SpO2 95%    Physical Exam  Constitutional:  The only. Frail. Currently distressed about her shoulder pains.  HENT:  And if normal. External auditory canals normal. Partial hearing loss bilaterally.  Eyes: EOM are normal. Pupils are equal, round, and reactive to light.  Wears corrective lenses.  Neck: Neck supple. No JVD present. No tracheal deviation present. No thyromegaly present.   Cardiovascular: Normal rate and normal heart sounds.  Exam reveals no gallop and no friction rub.   No murmur heard. Pulmonary/Chest: Effort normal. No respiratory distress. She has no wheezes. She has rales. She exhibits no tenderness.  Abdominal: Soft. Bowel sounds are normal. She exhibits no distension and no mass. There is no tenderness.  Musculoskeletal: She exhibits no edema and no tenderness.  Limited range of motion of the right shoulder. Is pain with elevation of the arm or rotational movements of the shoulder.  Lymphadenopathy:    She has no cervical adenopathy.  Neurological:  Conversational, but there is a significant memory deficit. She does however want her husband is in attendance with her for details.  Skin:  Multiple senile ecchymoses, otherwise normal  Psychiatric: She has a normal mood and affect. Her behavior is normal.       Assessment & Plan:  SOB (shortness of breath) : There is evidence of O2 sat dropping when she walks. O2 sat at rest was 95%, following walking it dropped to 89%. She showed shortness of breath after walking. Etiology for this problem is not established. Although she may require referral to a cardiologist as well as pulmonologist, it seems prudent to refer her to a pulmonologist first for an opinion and any testing that is necessary, such as spirometry and/or perfusion test.  - Plan: Ambulatory referral to Pulmonology  Pain in joint, shoulder region, right: Patient advised to call her orthopedist, Dr. Magnus Ivan

## 2013-06-07 DIAGNOSIS — M25519 Pain in unspecified shoulder: Secondary | ICD-10-CM | POA: Insufficient documentation

## 2013-06-15 ENCOUNTER — Ambulatory Visit (INDEPENDENT_AMBULATORY_CARE_PROVIDER_SITE_OTHER): Payer: Medicare Other | Admitting: Internal Medicine

## 2013-06-15 ENCOUNTER — Encounter: Payer: Self-pay | Admitting: Internal Medicine

## 2013-06-15 ENCOUNTER — Ambulatory Visit (INDEPENDENT_AMBULATORY_CARE_PROVIDER_SITE_OTHER)
Admission: RE | Admit: 2013-06-15 | Discharge: 2013-06-15 | Disposition: A | Discharge status: Home / Self Care | Payer: Medicare Other | Source: Ambulatory Visit | Attending: Internal Medicine | Admitting: Internal Medicine

## 2013-06-15 ENCOUNTER — Other Ambulatory Visit (INDEPENDENT_AMBULATORY_CARE_PROVIDER_SITE_OTHER): Payer: Medicare Other

## 2013-06-15 VITALS — BP 112/80 | HR 50 | Temp 98.0°F | Ht 64.0 in | Wt 153.2 lb

## 2013-06-15 DIAGNOSIS — R0602 Shortness of breath: Secondary | ICD-10-CM

## 2013-06-15 LAB — BASIC METABOLIC PANEL
CO2: 27 mEq/L (ref 19–32)
Calcium: 9.4 mg/dL (ref 8.4–10.5)
Sodium: 131 mEq/L — ABNORMAL LOW (ref 135–145)

## 2013-06-15 LAB — CBC WITH DIFFERENTIAL/PLATELET
Basophils Relative: 0.8 % (ref 0.0–3.0)
Eosinophils Relative: 4.3 % (ref 0.0–5.0)
Hemoglobin: 11.8 g/dL — ABNORMAL LOW (ref 12.0–15.0)
Lymphocytes Relative: 21 % (ref 12.0–46.0)
MCHC: 33.2 g/dL (ref 30.0–36.0)
Monocytes Relative: 16.8 % — ABNORMAL HIGH (ref 3.0–12.0)
Neutro Abs: 5.6 10*3/uL (ref 1.4–7.7)
RBC: 4.29 Mil/uL (ref 3.87–5.11)

## 2013-06-15 LAB — BRAIN NATRIURETIC PEPTIDE: Pro B Natriuretic peptide (BNP): 519 pg/mL — ABNORMAL HIGH (ref 0.0–100.0)

## 2013-06-15 NOTE — Progress Notes (Signed)
  Subjective:    Patient ID: Jenny [name], female    DOB: [birth year]   MRN: [id number]  HPI  77 yowf living in indep living at [facility] qit smoking 2010 referred 06/15/2013 to Pulmonary clinic by Dr Art Chilton Si to pulmonary clinic for eval of sob. Previously had "neg pulmonary w/u" in 2007 at [facility] but paper chart from visit not available  06/15/2013 1st pulmonary ov in EMR era/ Mohd. Derflinger  cc doe x walking in halls stops twice walking to cafeteria  due to sob and back/ legs give out x about a year, indolent onset, minimally progressive and perfectly reproducible daily.   No obvious daytime variabilty or assoc chronic cough or cp or chest tightness, subjective wheeze overt sinus or hb symptoms. No unusual exp hx or h/o childhood pna/ asthma or knowledge of premature birth.   Sleeping ok without nocturnal  or early am exacerbation  of respiratory  c/o's or need for noct saba. Also denies any obvious fluctuation of symptoms with weather or environmental changes or other aggravating or alleviating factors except as outlined above   Review of Systems  Constitutional: Negative for fever and unexpected weight change.  HENT: Positive for trouble swallowing and dental problem. Negative for ear pain, nosebleeds, congestion, sore throat, rhinorrhea, sneezing, postnasal drip and sinus pressure.   Eyes: Negative for redness and itching.  Respiratory: Positive for shortness of breath. Negative for cough, chest tightness and wheezing.   Cardiovascular: Negative for palpitations and leg swelling.  Gastrointestinal: Negative for nausea and vomiting.  Genitourinary: Negative for dysuria.  Musculoskeletal: Positive for joint swelling and arthralgias.  Skin: Negative for rash.  Neurological: Positive for headaches.  Hematological: Does not bruise/bleed easily.  Psychiatric/Behavioral: Positive for dysphoric mood. The patient is nervous/anxious.        Objective:   Physical Exam  Wt Readings from Last 3  Encounters:  06/15/13 153 lb 3.2 oz (69.491 kg)  06/04/13 153 lb (69.4 kg)  05/07/13 153 lb (69.4 kg)    Elderly wf nad walks with rolling walker HEENT mild turbinate edema.  Oropharynx no thrush or excess pnd or cobblestoning.  No JVD or cervical adenopathy. Mild accessory muscle hypertrophy. Trachea midline, nl thryroid. Chest was hyperinflated by percussion with diminished breath sounds and moderate increased exp time without wheeze. Hoover sign positive at mid inspiration. Regular rate and rhythm without murmur gallop or rub or increase P2 or edema.  Abd: no hsm, nl excursion. Ext warm without cyanosis or clubbing.     CXR  06/15/2013 :  No acute cardiopulmonary disease identified.      Assessment & Plan:

## 2013-06-15 NOTE — Patient Instructions (Addendum)
Please remember to go to the lab and x-ray department downstairs for your tests - we will call you with the results when they are available.  GERD (REFLUX)  is an extremely common cause of respiratory symptoms just like yours, many times with no significant heartburn at all.    It can be treated with medication, but also with lifestyle changes including avoidance of late meals, excessive alcohol, smoking cessation, and avoid fatty foods, chocolate, peppermint, colas, red wine, and acidic juices such as orange juice.  NO MINT OR MENTHOL PRODUCTS SO NO COUGH DROPS  USE SUGARLESS CANDY INSTEAD (jolley ranchers or Stover's)  NO OIL BASED VITAMINS - use powdered substitutes.      Please schedule a follow up office visit in 4 weeks, sooner if needed with pfts

## 2013-06-15 NOTE — Assessment & Plan Note (Addendum)
-    06/15/2013  Walked RA  2 laps @ 185 ft each stopped due to legs gave out, no desat    When respiratory symptoms begin or become refractory well after a patient reports complete smoking cessation,  Especially when this wasn't the case while they were smoking, a red flag is raised based on the work of Dr Primitivo Gauze which states:  if you quit smoking when your best day FEV1 is still well preserved it is highly unlikely you will progress to severe disease.  That is to say, once the smoking stops,  the symptoms should not suddenly erupt or markedly worsen.  If so, the differential diagnosis should include  obesity/deconditioning,  LPR/Reflux/Aspiration syndromes,  occult CHF, or  especially side effect of medications commonly used in this population.    ? Acid reflux > always difficult to exclude > rec diet  ? Anxiety/depression/ deconditioning > dx of exclusion, will see back in 4 weeks for pft's but do not rec any change inr for now

## 2013-06-16 NOTE — Progress Notes (Signed)
Quick Note:  Spoke with pt and notified of results per Dr. Wert. Pt verbalized understanding and denied any questions.  ______ 

## 2013-06-17 ENCOUNTER — Encounter: Payer: Self-pay | Admitting: Internal Medicine

## 2013-06-17 DIAGNOSIS — J449 Chronic obstructive pulmonary disease, unspecified: Secondary | ICD-10-CM | POA: Insufficient documentation

## 2013-06-18 ENCOUNTER — Non-Acute Institutional Stay: Payer: Medicare Other | Admitting: Internal Medicine

## 2013-06-18 ENCOUNTER — Encounter: Payer: Self-pay | Admitting: Internal Medicine

## 2013-06-18 VITALS — BP 152/80 | HR 60 | Ht 64.0 in | Wt 151.0 lb

## 2013-06-18 DIAGNOSIS — M48061 Spinal stenosis, lumbar region without neurogenic claudication: Secondary | ICD-10-CM

## 2013-06-18 DIAGNOSIS — J449 Chronic obstructive pulmonary disease, unspecified: Secondary | ICD-10-CM

## 2013-06-18 DIAGNOSIS — E039 Hypothyroidism, unspecified: Secondary | ICD-10-CM

## 2013-06-18 DIAGNOSIS — F32A Depression, unspecified: Secondary | ICD-10-CM

## 2013-06-18 DIAGNOSIS — R0602 Shortness of breath: Secondary | ICD-10-CM

## 2013-06-18 DIAGNOSIS — J42 Unspecified chronic bronchitis: Secondary | ICD-10-CM

## 2013-06-18 DIAGNOSIS — F329 Major depressive disorder, single episode, unspecified: Secondary | ICD-10-CM

## 2013-06-18 DIAGNOSIS — M1990 Unspecified osteoarthritis, unspecified site: Secondary | ICD-10-CM

## 2013-06-18 NOTE — Patient Instructions (Signed)
Apply Aquaphor to toenails twice weekly. Use Aspercreme to fingers to help arthritic pains. Continue all other medications.

## 2013-06-18 NOTE — Progress Notes (Signed)
Subjective:    Patient ID: Jenny [name], female    DOB: Dec 12, 1926, 79 y.o.   MRN: [id number]  HPI Saw Dr. Magnus Ivan about her right shoulder. He did Xray and gave injection. That has helped.  Saw Dr. Sherene Sires. He is suspicious of GERD. Did xray that was OK. Has scheduled PFT.   Bronchitis, chronic: Denies cough or sputum production  Osteoarthritis: Right 2nd finger has pain in the morning at the DIP.  Spinal stenosis of lumbar region: Back pains have improved  Shortness of breath: continues and seems related more to exertion  Depressive disorder: Continues to see Dr. Emerson Monte  Hypothyroidism: Stable  COPD GOLD I: This patient most likely is related to this, however, I can't rule out possibility of cardiac issues.    Current Outpatient Prescriptions on File Prior to Visit  Medication Sig Dispense Refill  . acetaminophen (TYLENOL) 325 MG tablet Take 325 mg by mouth. One daily as needed      . Ascorbic Acid (VITAMIN C) 1000 MG tablet Take 1,000 mg by mouth daily with breakfast.      . aspirin EC 81 MG tablet Take 81 mg by mouth daily.      . brimonidine-timolol (COMBIGAN) 0.2-0.5 % ophthalmic solution Place 1 drop into both eyes every 12 (twelve) hours.        Marland Kitchen BYSTOLIC 10 MG tablet TAKE (1) TABLET DAILY FOR HIGH BLOOD PRESSURE.  30 tablet  3  . calcitonin, salmon, (MIACALCIN/FORTICAL) 200 UNIT/ACT nasal spray Place 1 spray into the nose. One spray daily      . calcium-vitamin D (OSCAL WITH D) 500-200 MG-UNIT per tablet Take one tablet three times daily for calcium supplementation      . clonazePAM (KLONOPIN) 0.5 MG tablet Take 0.25 mg by mouth 3 (three) times daily as needed. Take one three times a day      . furosemide (LASIX) 40 MG tablet TAKE 1 TABLET TWICE DAILY TO PREVENT FLUID RETENTION.  60 tablet  3  . lamoTRIgine (LAMICTAL) 200 MG tablet Take 200 mg by mouth daily.      Marland Kitchen levothyroxine (SYNTHROID, LEVOTHROID) 88 MCG tablet Take 88 mcg by mouth daily with breakfast.  For thyroid      . multivitamin-lutein (OCUVITE-LUTEIN) CAPS Take 1 capsule by mouth daily.      . naproxen sodium (ANAPROX) 220 MG tablet Take 220 mg by mouth. Take one daily      . potassium chloride SA (K-DUR,KLOR-CON) 20 MEQ tablet Take 1 tablet (20 mEq total) by mouth 2 (two) times daily. Take one twice daily  60 tablet  3  . PRESCRIPTION MEDICATION 3-4 capsules once. Pt took 3-4 clindamycin before surgery on 04/30/2013 once. Pharmacy did not have record of this-per patient and spouse      . senna (SENOKOT) 8.6 MG TABS Take 2 tablets by mouth at bedtime.      . sertraline (ZOLOFT) 100 MG tablet Take 50 mg by mouth daily.       . traZODone (DESYREL) 50 MG tablet 50 mg. Take one tablet at bedtime       No current facility-administered medications on file prior to visit.    Review of Systems  Constitutional: Negative for fever, chills, appetite change and unexpected weight change.  HENT: Positive for hearing loss.   Eyes: Negative.   Respiratory: Positive for shortness of breath. Negative for cough, choking, chest tightness and wheezing.   Cardiovascular: Negative for chest pain, palpitations and leg swelling.  Gastrointestinal: Negative.   Endocrine: Negative.   Musculoskeletal: Positive for myalgias, back pain, arthralgias and gait problem.       Continues to have significant right shoulder discomfort. It has improved since she was last seen by me.  Skin:       Multiple senile ecchymoses.  Neurological: Negative for dizziness, tremors, seizures, facial asymmetry, speech difficulty, weakness and numbness.       Progressive memory loss is present.  Hematological: Negative.   Psychiatric/Behavioral:       History of bipolar disorder, with depression being the dominant affect usually. Next chronic anxiety and nervousness. She is confused because of her progressing dementia. Her episodes of agitation.       Objective:BP 152/80  Pulse 60  Ht 5\' 4"  (1.626 m)  Wt 151 lb (68.493 kg)   BMI 25.91 kg/m2    Physical Exam  Constitutional:  The only. Frail. Currently distressed about her shoulder pains.  HENT:  And if normal. External auditory canals normal. Partial hearing loss bilaterally.  Eyes: EOM are normal. Pupils are equal, round, and reactive to light.  Wears corrective lenses.  Neck: Neck supple. No JVD present. No tracheal deviation present. No thyromegaly present.  Cardiovascular: Normal rate and normal heart sounds.  Exam reveals no gallop and no friction rub.   No murmur heard. Pulmonary/Chest: Effort normal. No respiratory distress. She has no wheezes. She has rales. She exhibits no tenderness.  Abdominal: Soft. Bowel sounds are normal. She exhibits no distension and no mass. There is no tenderness.  Musculoskeletal: She exhibits no edema and no tenderness.  Limited range of motion of the right shoulder. Has pain with elevation of the arm or rotational movements of the shoulder.  Lymphadenopathy:    She has no cervical adenopathy.  Neurological:  Conversational, but there is a significant memory deficit. She does however want her husband is in attendance with her for details.  Skin:  Multiple senile ecchymoses, otherwise normal  Psychiatric: She has a normal mood and affect. Her behavior is normal.   Patient has brittle nails especially toenail.     Assessment & Plan:  Shortness of breath: Under evaluation. She was to have pulmonary function tests. Depending upon the results of these tests, I would consider referring her to a cardiologist.  Osteoarthritis: Improved. Some finger discomfort. Recommended she try Aspercreme.  Spinal stenosis of lumbar region: Back discomfort has improved  Depressive disorder: Stable  Hypothyroidism: Compensated  COPD GOLD I: Possibly associated with her complaints of dyspnea  Bronchitis, chronic: Uncertain whether this is associated with does not seem to be active with this problem at the present time.  Brittle nails:  Constipation this was an irreversible problem. She can get some improvement in appearance by applying Aquaphor twice weekly.

## 2013-07-22 ENCOUNTER — Ambulatory Visit: Payer: Medicare Other | Admitting: Internal Medicine

## 2013-07-23 ENCOUNTER — Other Ambulatory Visit: Payer: Self-pay | Admitting: *Deleted

## 2013-07-23 MED ORDER — CALCITONIN (SALMON) 200 UNIT/ACT NA SOLN: NASAL | Status: DC

## 2013-07-25 ENCOUNTER — Other Ambulatory Visit: Payer: Self-pay | Admitting: Internal Medicine

## 2013-08-03 ENCOUNTER — Ambulatory Visit: Payer: Medicare Other | Admitting: Neurology

## 2013-08-03 ENCOUNTER — Encounter: Payer: Self-pay | Admitting: Neurology

## 2013-08-03 ENCOUNTER — Ambulatory Visit (INDEPENDENT_AMBULATORY_CARE_PROVIDER_SITE_OTHER): Payer: Medicare Other | Admitting: Neurology

## 2013-08-03 VITALS — BP 159/78 | HR 60 | Temp 98.0°F | Ht 62.5 in | Wt 152.0 lb

## 2013-08-03 DIAGNOSIS — F0280 Dementia in other diseases classified elsewhere without behavioral disturbance: Secondary | ICD-10-CM

## 2013-08-03 DIAGNOSIS — F411 Generalized anxiety disorder: Secondary | ICD-10-CM

## 2013-08-03 DIAGNOSIS — Z79899 Other long term (current) drug therapy: Secondary | ICD-10-CM

## 2013-08-03 DIAGNOSIS — G244 Idiopathic orofacial dystonia: Secondary | ICD-10-CM

## 2013-08-03 DIAGNOSIS — F329 Major depressive disorder, single episode, unspecified: Secondary | ICD-10-CM

## 2013-08-03 DIAGNOSIS — R413 Other amnesia: Secondary | ICD-10-CM

## 2013-08-03 MED ORDER — MEMANTINE HCL ER 21 MG PO CP24: 21.0000 mg | ORAL_CAPSULE | Freq: Every day | ORAL | Status: DC

## 2013-08-03 NOTE — Patient Instructions (Addendum)
I think overall you are doing fairly well but I do want to suggest a few things today:  Remember to drink plenty of fluid, eat healthy meals and do not skip any meals. Try to eat protein with a every meal and eat a healthy snack such as fruit or nuts in between meals. Try to keep a regular sleep-wake schedule and try to exercise daily, particularly in the form of walking, 20-30 minutes a day, if you can.   Engage in social activities in your community and with your family and try to keep up with current events by reading the newspaper or watching the news.   As far as your medications are concerned, I would like to suggest trial of namenda: Pls start the starter pack and use as directed. This will be a 4 week supply, then fill prescription for Namenda XR 21 mg, once daily, which will be a free 30 day supply with coupon. Call for concerns, side effects and if you cannot fill prescription due to shortage at pharmacies. We can give you another starter kit to bridge.   As far as diagnostic testing: no new test.   I would like to see you back in 3 months, sooner if we need to. Please call us with any interim questions, concerns, problems, updates or refill requests.  Brett Canales is my clinical assistant and will answer any of your questions and relay your messages to me and also relay most of my messages to you.  Our phone number is [phone]. We also have an after hours call service for urgent matters and there is a physician on-call for urgent questions. For any emergencies you know to call 911 or go to the nearest emergency room.

## 2013-08-03 NOTE — Progress Notes (Signed)
Subjective:    Patient ID: Jenny [name] is a 79 y.o. female.  HPI  Interim history:   Jenny [name] is a very friendly 79 year old right-handed woman who presents for followup consultation of her memory loss. She is accompanied by her husband and daughter again today. I first met her on 05/06/13, at which time I felt that her depression and also her psychotropic medications were confounding the picture. She was not able to tolerate Aricept and Exelon.  She previously followed with Dr. Avie Echevaria and was last seen by him on 12/24/2012, at which time he felt that she had progressed in her memory loss. Her MMSE at that time was 23, down from 27. He restarted her on donepezil at the time. Her falls assessment tool score was 12 at the time. She has an underlying medical history of heart disease, stroke, chronic back pain, depression, thyroid disease and hypertension. She is currently on trazodone, clonazepam, Combigan, Restasis, MiraLax, Aleve, senna, omeprazole, potassium, Lasix, Lamictal 200 mg at bedtime, Synthroid, baby aspirin, Zoloft 50 mg daily, calcium and vitamin D and Bystolic. She was taken off Abilify.  She has a history of bipolar affective disorder, dizziness, memory loss, anxiety, TIA, migraines, and memory loss. MRI brain with and without contrast in August 2004 showed chronic small vessel disease including the pons and cerebral deep white matter and frontotemporal atrophy. MRI of the brain with and without contrast dated May 2009 showed atrophy, prominent frontally and temporally, right greater than left with moderate nonspecific periventricular and subcortical white matter disease most consistent with small vessel ischemia. Carotid Doppler study in February 2013, retrograde flow in the left vertebral artery and high-grade left proximal subclavian stenosis. This was not much different from March 2010. Echocardiogram from June 2004 showed normal left ventricular function and minimal mitral valve  prolapse. She lives [facility] with her husband. She has had problems with word finding and naming. She had experienced depression and nausea with the Aricept. She has a history of back pain. MRI L-spine without contrast in March 2011 showed right-sided disease at L2-3, L3-4 and L4-5 with right-sided disease at L1-2. She had spinal canal stenosis at L3-4 and L4-5. She has been through physical therapy twice and uses a TENS unit. She has no bowel or bladder incontinence. She walks with a walker and a cane. She had improvement of her symptoms after epidural steroid injections. She sees an orthopedic doctor for right knee pain. In February 2013 her MMSE was 27, clock drawing was 4, and a multiple latency was 13. In February 2014 her MMSE was 23, clock drawing was 4, animal fluency was 14. She has had problems with her teeth and multiple new cavities. She has been using a lot of diet coke and hard candies.  She has had ongoing abnormal lip and mouth movements, which are a little better. She is off Xanax and off pain medications. She is taking Klonopin 0.25 mg tid. She sees Dr. Nolen Mu in Psychiatry.   Her Past Medical History Is Significant For: Past Medical History  Diagnosis Date  . Hypertension   . Coronary artery disease   . Heart murmur   . Hypothyroidism 05-02-12    tx. Synthroid  . Headache(784.0) 05-02-12    past hx. migrianes-many yrs ago  . Arthritis 05-02-12    osteoarthritis-rt. hip  . Pneumonia 05-02-12    mild case  . 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 Acute upper respiratory infections of unspecified site 2014  .  Unspecified venous (peripheral) insufficiency 2013  . Senile osteoporosis 2013  . Unspecified closed fracture of pelvis 10/13/2012  . Cellulitis and abscess of leg, except foot 2013  . Altered mental status 2013  . Chronic pain syndrome 2013  . Urinary tract infection, site not specified   . Hyposmolality and/or hyponatremia 05/2012  . Congestive heart failure,  unspecified 05/2012  . Pneumonia, organism unspecified 05/2012  . Pressure ulcer, buttock(707.05) 05/2012  . Hypoxemia 05/2012  . Reflux esophagitis 3013  . Unspecified constipation 2013  . Acute posthemorrhagic anemia 05/2012  . Coronary atherosclerosis of native coronary artery 05/2012  . Pain in joint, pelvic region and thigh 04/2012  . Dysphagia, unspecified(787.20) 03/2012  . Benign neoplasm of adrenal gland 10/2011  . Alzheimer's disease 06/2011  . Other seborrheic keratosis 2012  . Hypertensive renal disease, benign 2012  . Pain in joint, lower leg 2012  . Memory loss 09/2010  . Spinal stenosis, lumbar region, without neurogenic claudication 2011  . Unspecified vitamin D deficiency 2010  . Unspecified disorder of kidney and ureter 2010  . Edema 2010  . Other malaise and fatigue 2009  . Congenital factor VIII disorder 2009  . Senile dementia with delusional features 2008  . Generalized hyperhidrosis 2007  . Irritable bowel syndrome 2007  . Unspecified chronic bronchitis 2006  . Depression 06/2005     depression only,tx. oral meds  . Shortness of breath 2005  . Anxiety 2004  . Unspecified late effects of cerebrovascular disease 2004  . Migraine with aura, without mention of intractable migraine without mention of status migrainosus 2003  . Tinnitus 2003  . Other symptoms involving cardiovascular system 2003  . Hyperlipidemia 2002    Her Past Surgical History Is Significant For: Past Surgical History  Procedure Laterality Date  . Tonsillectomy    . Tubal ligation  05-02-12  .  tummy tuck  05-02-12    many yrs ago  . Cataract extraction, bilateral  05-02-12    bilateral  . Retinal detachment surgery  05-02-12    left  . Total hip arthroplasty  05/09/2012    Procedure: TOTAL HIP ARTHROPLASTY ANTERIOR APPROACH;  Surgeon: Kathryne Hitch, MD;  Location: [facility];  Service: Orthopedics;  Laterality: Right;    Her Family History Is Significant For: Family History  Problem  Relation Age of Onset  . Diabetes Father     Her Social History Is Significant For: History   Social History  . Marital Status: Married    Spouse Name: N/A    Number of Children: N/A  . Years of Education: N/A   Occupational History  . retired    Social History Main Topics  . Smoking status: Former Smoker -- 1.00 packs/day for 62 years    Types: Cigarettes    Quit date: 10/10/2008  . Smokeless tobacco: Never Used  . Alcohol Use: No  . Drug Use: No  . Sexual Activity: No   Other Topics Concern  . None   Social History Narrative  . None    Her Allergies Are:  Allergies  Allergen Reactions  . Penicillins Rash  :   Her Current Medications Are:  Outpatient Encounter Prescriptions as of 08/03/2013  Medication Sig Dispense Refill  . acetaminophen (TYLENOL) 325 MG tablet Take 325 mg by mouth. One daily as needed      . Ascorbic Acid (VITAMIN C) 1000 MG tablet Take 1,000 mg by mouth daily with breakfast.      . aspirin EC  81 MG tablet Take 81 mg by mouth daily.      . brimonidine-timolol (COMBIGAN) 0.2-0.5 % ophthalmic solution Place 1 drop into both eyes every 12 (twelve) hours.        Marland Kitchen BYSTOLIC 10 MG tablet TAKE (1) TABLET DAILY FOR HIGH BLOOD PRESSURE.  30 tablet  3  . calcitonin, salmon, (MIACALCIN/FORTICAL) 200 UNIT/ACT nasal spray Use one spray in one nostril once daily. Alternate nostrils daily  3.7 mL  3  . calcium-vitamin D (OSCAL WITH D) 500-200 MG-UNIT per tablet Take one tablet three times daily for calcium supplementation      . clonazePAM (KLONOPIN) 0.5 MG tablet Take 0.25 mg by mouth 3 (three) times daily as needed. Take one three times a day      . furosemide (LASIX) 40 MG tablet TAKE 1 TABLET TWICE DAILY TO PREVENT FLUID RETENTION.  60 tablet  3  . lamoTRIgine (LAMICTAL) 200 MG tablet Take 200 mg by mouth daily.      Marland Kitchen levothyroxine (SYNTHROID, LEVOTHROID) 88 MCG tablet Take 88 mcg by mouth daily with breakfast. For thyroid      . multivitamin-lutein  (OCUVITE-LUTEIN) CAPS Take 1 capsule by mouth daily.      . naproxen sodium (ANAPROX) 220 MG tablet Take 220 mg by mouth. Take one daily      . potassium chloride SA (K-DUR,KLOR-CON) 20 MEQ tablet TAKE 1 TABLET TWICE DAILY.  60 tablet  1  . PRESCRIPTION MEDICATION 3-4 capsules once. Pt took 3-4 clindamycin before surgery on 04/30/2013 once. Pharmacy did not have record of this-per patient and spouse      . senna (SENOKOT) 8.6 MG TABS Take 2 tablets by mouth at bedtime.      . sertraline (ZOLOFT) 100 MG tablet Take 50 mg by mouth daily.       . traZODone (DESYREL) 50 MG tablet 50 mg. Take one tablet at bedtime       No facility-administered encounter medications on file as of 08/03/2013.  :  Review of Systems  HENT: Positive for tinnitus.   Neurological:       Memory loss  Psychiatric/Behavioral: Positive for confusion and dysphoric mood. The patient is nervous/anxious.     Objective:  Neurologic Exam  Physical Exam Physical Examination:   Filed Vitals:   08/03/13 1521  BP: 159/78  Pulse: 60  Temp: 98 F (36.7 C)   General Examination: The patient is a very pleasant 79 y.o. female in no acute distress. She is calm and cooperative with the exam. She denies Auditory Hallucinations and Visual Hallucinations. She is well groomed and situated in a chair.   HEENT: Normocephalic, atraumatic, pupils are equal, round and reactive to light and accommodation. Extraocular tracking shows mild saccadic breakdown without nystagmus noted. Hearing is impaired mildly. Face is symmetric with no facial masking and normal facial sensation. There is no lip, neck or jaw tremor. Neck is mildly rigid with intact passive ROM. There are no carotid bruits on auscultation. Oropharynx exam reveals moderate mouth dryness and she has mild lip smacking and tongue sucking noises. No significant airway crowding is noted. Mallampati is class II. Tongue protrudes centrally and palate elevates symmetrically.   Chest: is  clear to auscultation without wheezing, rhonchi or crackles noted.  Heart: sounds are regular and normal with a 3/6 murmurs.   Abdomen: is soft, non-tender and non-distended with normal bowel sounds appreciated on auscultation.  Extremities: There is no pitting edema in the distal lower extremities bilaterally. Pedal  pulses are intact.  Skin: is warm and dry with no trophic changes noted.  Musculoskeletal: exam reveals no obvious joint deformities, tenderness or joint swelling or erythema.  Neurologically:  Mental status: The patient is awake and alert, paying good  attention. She is able to partially provide the history. Her daughter and husband provide details. She is oriented to: place, time/date, situation, day of week, month of year and year. Her memory, attention, language and knowledge are impaired. There is no aphasia, agnosia, apraxia or anomia. There is a mild degree of bradyphrenia. Speech is mildly hypophonic with mild dysarthria noted. Mood is congruent and affect is blunted.   Her MMSE is 23/30, CDT was 4/4 and AFT was 10.   Motor exam: Normal bulk, and strength for age is noted. Tone is not rigid with absence of cogwheeling in the extremities. There is overall mild psychomotor retardation. there is no drift or rebound. There is no tremor. She has mild oral facial dyskinesias in particular with lipsmacking. Reflexes are 1+ in the upper extremities and 1+ in the lower extremities. Toes are downgoing bilaterally. Fine motor skills: Finger taps, hand movements, and rapid alternating patting are mildly impaired bilaterally. Foot taps and foot agility are mildly impaired bilaterally.   Cerebellar testing shows no dysmetria or intention tremor on finger to nose testing. There is no truncal or gait ataxia.   Sensory exam is intact to light touch.   Gait, station and balance: She stands up from the seated position with mild difficulty and needs some assistance. No veering to one side is  noted. No leaning to one side. Posture is mildly stooped. Stance is wide-based. She is able to walk without her walker for a few steps but walks cautiously. With a walker she walks more securely and maneuvers it well. She turns in 3 steps. Tandem walk is not possible. Balance is mildly impaired.   Assessment and Plan:   In summary, SACORA HAWBAKER is a very pleasant 79 year old female with a history of memory loss, likely Alzheimer's dementia, with inability to tolerate Aricept (twice) and Exelon patch recently. She has a confounding Hx of long-standing history of severe depression and anxiety. She  is on multiple medications and her psychiatrist has tried to streamline her medication. She had developed oropharyngeal dyskinesias which are still apparent now. She has severe mouth dryness. Her MMSE is stable at 23, her clock drawing is stable at 4 out of 4 and her animal fluency test was a little less than before at 10 as opposed to 14 in February of this year. At this juncture I would like to try her on Namenda. It has a different mechanism of action than the anticholinergics. I talked to her family and her at length about this and and I would like to get her started on regular nontender start her can't even though it is no longer being made. We do not have a starter kit available in sample for the Namenda XR. I talked to them about potential side effects. I would like to get her on Namenda gradually with the idea that in about 4 weeks she will be taking 10 mg twice daily at the maintenance dose. At that time I would like for her to try a 30 day free trial of Namenda XR 21 mg once daily. If there continues to be a shortage on long-acting Namenda at the time we can perhaps a bridge her with another Namenda starter kit to use at 10 mg  twice daily until they long-acting strengths are available. There is potentially a 3-6 week back up at this time. I reassured her that her exam is stable for the most part. Her husband  adds that she has become a little bit more outgoing recently which is reassuring as well. We talked about maintaining a healthy lifestyle in general and staying active mentally and physically. I encouraged the patient to eat healthy, exercise daily and keep well hydrated, to keep a scheduled bedtime and wake time routine, to not skip any meals and eat healthy snacks in between meals and to have protein with every meal. I stressed the importance of regular exercise, within of course the patient's own mobility limitations. I encouraged the patient to keep up with current events by reading the news paper or watching the news. She is advised to stop drinking diet sodas and encouraged to drink more water. I answered all their questions today and the patient and her family were in agreement with the above outlined plan. I would like to see the patient back in 3 months, sooner if the need arises and encouraged them to call with any interim questions, concerns, problems, updates.

## 2013-08-06 ENCOUNTER — Encounter: Payer: Self-pay | Admitting: Internal Medicine

## 2013-08-06 ENCOUNTER — Ambulatory Visit (INDEPENDENT_AMBULATORY_CARE_PROVIDER_SITE_OTHER): Payer: Medicare Other | Admitting: Internal Medicine

## 2013-08-06 VITALS — BP 130/84 | HR 60 | Temp 98.7°F | Ht 61.0 in | Wt 152.0 lb

## 2013-08-06 DIAGNOSIS — R0602 Shortness of breath: Secondary | ICD-10-CM

## 2013-08-06 DIAGNOSIS — J449 Chronic obstructive pulmonary disease, unspecified: Secondary | ICD-10-CM

## 2013-08-06 LAB — PULMONARY FUNCTION TEST

## 2013-08-06 MED ORDER — TIOTROPIUM BROMIDE MONOHYDRATE 18 MCG IN CAPS: 18.0000 ug | ORAL_CAPSULE | Freq: Every day | RESPIRATORY_TRACT | Status: DC

## 2013-08-06 NOTE — Patient Instructions (Addendum)
spiriva one capsule daily first thing in Am x  trial of one month and fill the prescription if you think it's working to help your wind when you walk - if not return here for follow up     If you are satisfied with your treatment plan let your doctor know and he/she can either refill your medications or you can return here when your prescription runs out.     If in any way you are not 100% satisfied,  please tell us.  If 100% better, tell your friends!

## 2013-08-06 NOTE — Progress Notes (Signed)
PFT done today. 

## 2013-08-06 NOTE — Progress Notes (Signed)
  Subjective:    Patient ID: Jenny [name], female    DOB: Apr 29, 1927   MRN: [id number]    Brief patient profile:  31 yowf living in indep living at [facility] qit smoking 2010 referred 06/15/2013 to Pulmonary clinic by Dr Jenny [name] to pulmonary clinic for eval of sob. Previously had "neg pulmonary w/u" in 2007 at [facility]      History of Present Illness   06/15/2013 1st pulmonary ov in EMR era/ Jenny [name]  cc doe x walking in halls stops twice walking to cafeteria  due to sob and back/ legs give out x about a year, indolent onset, minimally progressive and perfectly reproducible daily.  rec GERD diet  08/06/2013 f/u ov/Jenny [name] re: doe Chief Complaint  Patient presents with  . Followup with PFT    Breathing is unchanged since the last visit.  No new co's today.   still having to stop twice when walking to cafeteria  No obvious day to day or daytime variabilty or assoc chronic cough or cp or chest tightness, subjective wheeze overt sinus or hb symptoms. No unusual exp hx or h/o childhood pna/ asthma or knowledge of premature birth.  Sleeping ok without nocturnal  or early am exacerbation  of respiratory  c/o's or need for noct saba. Also denies any obvious fluctuation of symptoms with weather or environmental changes or other aggravating or alleviating factors except as outlined above   Current Medications, Allergies, Complete Past Medical History, Past Surgical History, Family History, and Social History were reviewed in [employer] record.  ROS  The following are not active complaints unless bolded sore throat, dysphagia, dental problems, itching, sneezing,  nasal congestion or excess/ purulent secretions, ear ache,   fever, chills, sweats, unintended wt loss, pleuritic or exertional cp, hemoptysis,  orthopnea pnd or leg swelling, presyncope, palpitations, heartburn, abdominal pain, anorexia, nausea, vomiting, diarrhea  or change in bowel or urinary habits, change in stools  or urine, dysuria,hematuria,  rash, arthralgias, visual complaints, headache, numbness weakness or ataxia or problems with walking or coordination,  change in mood/affect or memory.             Objective:   Physical Exam  . Wt Readings from Last 3 Encounters:  08/06/13 152 lb (68.947 kg)  08/03/13 152 lb (68.947 kg)  06/18/13 151 lb (68.493 kg)       Elderly wf nad walks with rolling walker HEENT mild turbinate edema.  Oropharynx no thrush or excess pnd or cobblestoning.  No JVD or cervical adenopathy. Mild accessory muscle hypertrophy. Trachea midline, nl thryroid. Chest was hyperinflated by percussion with diminished breath sounds and moderate increased exp time without wheeze. Hoover sign positive at mid inspiration. Regular rate and rhythm without murmur gallop or rub or increase P2 or edema.  Abd: no hsm, nl excursion. Ext warm without cyanosis or clubbing.     CXR  06/15/2013 :  No acute cardiopulmonary disease identified.      Assessment & Plan:

## 2013-08-07 NOTE — Assessment & Plan Note (Addendum)
-   PFTs' 10/12/2005 FEV1  1.65 ( 96%) ratio 53 and 12 % better p B2 with nl dlco - PFT's 08/06/2013  FEV1 0.96 (62%) ratio 54 and 15% better p B2 and DLCO 46 corrects to 77%  - Trial of spiriva 08/06/13   Unfortunately continue to smoke after dx with significant copd but really not active enough to benefit at this point from really aggressive rx so will try simple additon of LAMA to her present regimen and use her activity tolerance to determine response  The proper method of use, as well as anticipated side effects, of a metered-dose inhaler are discussed and demonstrated to the patient. Improved effectiveness after extensive coaching during this visit to a level of approximately  90%

## 2013-08-11 HISTORY — PX: DENTAL SURGERY: SHX609

## 2013-08-27 ENCOUNTER — Encounter: Payer: Self-pay | Admitting: Internal Medicine

## 2013-08-27 ENCOUNTER — Non-Acute Institutional Stay: Payer: Medicare Other | Admitting: Internal Medicine

## 2013-08-27 VITALS — BP 134/86 | HR 68 | Ht 61.0 in | Wt 152.0 lb

## 2013-08-27 DIAGNOSIS — J449 Chronic obstructive pulmonary disease, unspecified: Secondary | ICD-10-CM

## 2013-08-27 DIAGNOSIS — F411 Generalized anxiety disorder: Secondary | ICD-10-CM

## 2013-08-27 DIAGNOSIS — F3160 Bipolar disorder, current episode mixed, unspecified: Secondary | ICD-10-CM

## 2013-08-27 DIAGNOSIS — N189 Chronic kidney disease, unspecified: Secondary | ICD-10-CM

## 2013-08-27 DIAGNOSIS — R0602 Shortness of breath: Secondary | ICD-10-CM

## 2013-08-27 DIAGNOSIS — F0280 Dementia in other diseases classified elsewhere without behavioral disturbance: Secondary | ICD-10-CM

## 2013-08-27 DIAGNOSIS — I129 Hypertensive chronic kidney disease with stage 1 through stage 4 chronic kidney disease, or unspecified chronic kidney disease: Secondary | ICD-10-CM

## 2013-08-27 DIAGNOSIS — E039 Hypothyroidism, unspecified: Secondary | ICD-10-CM

## 2013-08-27 DIAGNOSIS — F329 Major depressive disorder, single episode, unspecified: Secondary | ICD-10-CM

## 2013-08-27 DIAGNOSIS — R269 Unspecified abnormalities of gait and mobility: Secondary | ICD-10-CM

## 2013-08-27 DIAGNOSIS — F419 Anxiety disorder, unspecified: Secondary | ICD-10-CM

## 2013-08-27 DIAGNOSIS — E785 Hyperlipidemia, unspecified: Secondary | ICD-10-CM

## 2013-08-27 NOTE — Progress Notes (Signed)
Subjective:    Patient ID: Jenny [name], female    DOB: [birth year], 79 y.o.   MRN: [id number] Chief Complaint  Patient presents with  . Medical Managment of Chronic Issues    blood pressure, depression, CHF, thyroid. Here with husband. Husband thinks her memory is worse.      HPI Shortness of breath: She continues to say that she wants to walk more, but does not make time for it. Her husband is willing to be with her when she is walking because of her risk of falling. She recognizes that she just sits most of the day. She remains dyspneic with only mild exertion.  COPD GOLD II: She is being followed by Dr. Sherene Sires (less than 08/06/13). He recently tried her on Spiriva, but she did not do well with this and stopped.  Hypertensive renal disease, benign: Chronic and stable condition  Dementia in conditions classified elsewhere without behavioral disturbance(294.10): Patient has recently seen Dr. Frances Furbish, neurologist and Dr. Emerson Monte, psychiatrist. Dr. Frances Furbish wanted her to try Namenda. She has not had any effects of intolerance or side effects, but does not feel that it's been very helpful to her. She would like to stop this medication.  Anxiety: Chronic issue. Currently treated with clonazepam, Lamictal, sertraline, and trazodone.  Depressive disorder: Followed by Dr. Emerson Monte. Last visit 07/20/13. Treated with Lamictal, sertraline, and trazodone.  Hyperlipidemia: untreated at this time. Followup lab requested next visit.  Hypothyroidism: Seems to be stable.   Abnormality of gait: At risk for falling. She has chronic back pains and groin pains. She has had a previous pelvic fracture. She has arthritis of the knees. She uses a walker when walking.  Recently had 3 teeth removed on 08/11/13 by Dr. Shea Evans. She tolerated the procedure well. She is eating.  Shoulder pain: Continue right shoulder pains. She has seen her orthopedist, Dr. Rayburn Ma. The second steroid injection in the right  shoulder on 08/10/13.  Current Outpatient Prescriptions on File Prior to Visit  Medication Sig Dispense Refill  . acetaminophen (TYLENOL) 325 MG tablet Take 325 mg by mouth. One daily as needed      . Ascorbic Acid (VITAMIN C) 1000 MG tablet Take 1,000 mg by mouth daily with breakfast.      . aspirin EC 81 MG tablet Take 81 mg by mouth daily.      . brimonidine-timolol (COMBIGAN) 0.2-0.5 % ophthalmic solution Place 1 drop into both eyes every 12 (twelve) hours.        Marland Kitchen BYSTOLIC 10 MG tablet TAKE (1) TABLET DAILY FOR HIGH BLOOD PRESSURE.  30 tablet  3  . calcitonin, salmon, (MIACALCIN/FORTICAL) 200 UNIT/ACT nasal spray Use one spray in one nostril once daily. Alternate nostrils daily  3.7 mL  3  . calcium-vitamin D (OSCAL WITH D) 500-200 MG-UNIT per tablet Take one tablet three times daily for calcium supplementation      . clonazePAM (KLONOPIN) 0.5 MG tablet Take 0.25 mg by mouth 3 (three) times daily as needed. Take one three times a day      . furosemide (LASIX) 40 MG tablet TAKE 1 TABLET TWICE DAILY TO PREVENT FLUID RETENTION.  60 tablet  3  . lamoTRIgine (LAMICTAL) 200 MG tablet Take 200 mg by mouth daily.      Marland Kitchen levothyroxine (SYNTHROID, LEVOTHROID) 88 MCG tablet Take 88 mcg by mouth daily with breakfast. For thyroid      . Memantine HCl ER (NAMENDA XR) 21 MG CP24 Take 21 mg by  mouth daily with breakfast.  30 capsule  0  . multivitamin-lutein (OCUVITE-LUTEIN) CAPS Take 1 capsule by mouth daily.      . naproxen sodium (ANAPROX) 220 MG tablet Take 220 mg by mouth. Take one daily      . potassium chloride SA (K-DUR,KLOR-CON) 20 MEQ tablet TAKE 1 TABLET TWICE DAILY.  60 tablet  1  . PRESCRIPTION MEDICATION 3-4 capsules once. Pt took 3-4 clindamycin before surgery on 04/30/2013 once. Pharmacy did not have record of this-per patient and spouse      . senna (SENOKOT) 8.6 MG TABS Take 2 tablets by mouth at bedtime.      . sertraline (ZOLOFT) 100 MG tablet Take 50 mg by mouth daily.       .  traZODone (DESYREL) 50 MG tablet 50 mg. Take one tablet at bedtime       No current facility-administered medications on file prior to visit.    Review of Systems  Constitutional: Negative for fever, chills, appetite change and unexpected weight change.  HENT: Positive for hearing loss.   Eyes: Negative.   Respiratory: Positive for shortness of breath. Negative for cough, choking, chest tightness and wheezing.   Cardiovascular: Negative for chest pain, palpitations and leg swelling.  Gastrointestinal: Negative.   Endocrine: Negative.   Musculoskeletal: Positive for arthralgias, back pain, gait problem and myalgias.       Continues to have significant right shoulder discomfort. It has improved since she was last seen by me.  Skin:       Multiple senile ecchymoses.  Neurological: Negative for dizziness, tremors, seizures, facial asymmetry, speech difficulty, weakness and numbness.       Progressive memory loss is present.  Hematological: Negative.   Psychiatric/Behavioral:       History of bipolar disorder, with depression being the dominant affect usually. Next chronic anxiety and nervousness. She is confused because of her progressing dementia. Her episodes of agitation.       Objective:BP 134/86  Pulse 68  Ht 5\' 1"  (1.549 m)  Wt 152 lb (68.947 kg)  BMI 28.74 kg/m2    Physical Exam  Constitutional:  Elderly. Frail. Currently distressed about her shoulder pains.  HENT:   External auditory canals normal. Partial hearing loss bilaterally.  Eyes: EOM are normal. Pupils are equal, round, and reactive to light.  Wears corrective lenses.  Neck: Neck supple. No JVD present. No tracheal deviation present. No thyromegaly present.  Cardiovascular: Normal rate and normal heart sounds.  Exam reveals no gallop and no friction rub.   No murmur heard. Pulmonary/Chest: Effort normal. No respiratory distress. She has no wheezes. She has rales. She exhibits no tenderness.  Abdominal: Soft.  Bowel sounds are normal. She exhibits no distension and no mass. There is no tenderness.  Musculoskeletal: She exhibits no edema and no tenderness.  Limited range of motion of the right shoulder. Has pain with elevation of the arm or rotational movements of the shoulder.  Lymphadenopathy:    She has no cervical adenopathy.  Neurological:  Conversational, but there is a significant memory deficit.  Skin:  Multiple senile ecchymoses, otherwise normal  Psychiatric: She has a normal mood and affect. Her behavior is normal.          Assessment & Plan:  Shortness of breath: The patient should continue to attempt to exercise. Walking is willing to do this. Another way might be to use the Nu-step exercise machine that is available to her in the fitness center.  COPD  GOLD II: Continue to follow Dr. Sherene Sires. Patient has already stopped her Spiriva.  Hypertensive renal disease, benign: Stable  Dementia in conditions classified elsewhere without behavioral disturbance(294.10): Gradually regressing dementia of uncertain etiology. It is difficult for me to tell whether this is a vascular dementia or Alzheimer's or a combination of the 2.  Anxiety: Controlled on current medications  Depressive disorder: Controlled on current medications  Hyperlipidemia: Recheck lab next visit  Hypothyroidism: Recheck lab next visit  Abnormality of gait: Continue to be aware of potential for falls. Recommended exercise to help strengthen her musculature. She should be assisted by another individual and use her walker when out walking.  Bipolar I disorder, most recent episode mixed: Continue visits with Dr. Emerson Monte.  Shoulder pain: Continue to followup with Dr. Magnus Ivan.

## 2013-08-28 DIAGNOSIS — F3160 Bipolar disorder, current episode mixed, unspecified: Secondary | ICD-10-CM | POA: Insufficient documentation

## 2013-08-28 DIAGNOSIS — R269 Unspecified abnormalities of gait and mobility: Secondary | ICD-10-CM | POA: Insufficient documentation

## 2013-08-28 NOTE — Patient Instructions (Signed)
Continue current medications as listed. 

## 2013-09-02 ENCOUNTER — Ambulatory Visit (INDEPENDENT_AMBULATORY_CARE_PROVIDER_SITE_OTHER): Payer: Medicare Other | Admitting: Ophthalmology

## 2013-09-02 DIAGNOSIS — H3530 Unspecified macular degeneration: Secondary | ICD-10-CM

## 2013-09-02 DIAGNOSIS — H33009 Unspecified retinal detachment with retinal break, unspecified eye: Secondary | ICD-10-CM

## 2013-09-02 DIAGNOSIS — I10 Essential (primary) hypertension: Secondary | ICD-10-CM

## 2013-09-02 DIAGNOSIS — H35039 Hypertensive retinopathy, unspecified eye: Secondary | ICD-10-CM

## 2013-09-02 DIAGNOSIS — H43819 Vitreous degeneration, unspecified eye: Secondary | ICD-10-CM

## 2013-09-21 ENCOUNTER — Other Ambulatory Visit: Payer: Self-pay | Admitting: Internal Medicine

## 2013-09-24 ENCOUNTER — Encounter: Payer: Self-pay | Admitting: Internal Medicine

## 2013-09-24 ENCOUNTER — Non-Acute Institutional Stay: Payer: Medicare Other | Admitting: Internal Medicine

## 2013-09-24 VITALS — BP 144/88 | HR 64 | Ht 62.0 in | Wt 151.0 lb

## 2013-09-24 DIAGNOSIS — F0392 Unspecified dementia, unspecified severity, with psychotic disturbance: Secondary | ICD-10-CM

## 2013-09-24 DIAGNOSIS — M1990 Unspecified osteoarthritis, unspecified site: Secondary | ICD-10-CM

## 2013-09-24 DIAGNOSIS — R269 Unspecified abnormalities of gait and mobility: Secondary | ICD-10-CM

## 2013-09-24 DIAGNOSIS — J4489 Other specified chronic obstructive pulmonary disease: Secondary | ICD-10-CM

## 2013-09-24 DIAGNOSIS — F329 Major depressive disorder, single episode, unspecified: Secondary | ICD-10-CM

## 2013-09-24 DIAGNOSIS — J449 Chronic obstructive pulmonary disease, unspecified: Secondary | ICD-10-CM

## 2013-09-24 DIAGNOSIS — F3160 Bipolar disorder, current episode mixed, unspecified: Secondary | ICD-10-CM

## 2013-09-24 DIAGNOSIS — F3289 Other specified depressive episodes: Secondary | ICD-10-CM

## 2013-09-24 DIAGNOSIS — F0280 Dementia in other diseases classified elsewhere without behavioral disturbance: Secondary | ICD-10-CM

## 2013-09-24 DIAGNOSIS — F0390 Unspecified dementia without behavioral disturbance: Secondary | ICD-10-CM

## 2013-09-24 NOTE — Progress Notes (Signed)
Subjective:    Patient ID: Jenny [name], female    DOB: Mar 15, 1927, 79 y.o.   MRN: [id number]  Chief Complaint  Patient presents with  . Medical Managment of Chronic Issues    Family wants evaluation for transfer to [facility], for trial period of perhaps 30 days for assement of her condition.  List of problems: depression, memory, breating, pain in shoulder, dry mouth.    HPI Patient's husband and daughter state that Jenny [name] seems to be regressing. She no longer is walking. She does continue to dress herself. She needs encouragement to take meals. They believe she is depressed. Jenny [name] her family would like to have another psychiatrist evaluate her.  Reasons for stopping walking are not clear. The patient does have shortness of breath. She has stopped her Spiriva for her COPD. She denies pains in the back or pelvis or knees that would stop her from walking. She just doesn't seem interested in walking anymore.  A lengthy discussion about her shoulder pain was felt at the time of her last office visit. He has seen Dr. Magnus Ivan and he gave her injections to the shoulder area. I recommended exercises. She has not done the exercises.    Mr. Schlichting is not in good health and is not coping well with the situation. The patient and her family believe that if she was admitted for a few weeks in the [facility] area, but she could be encouraged to do more for herself and resume walking. It would also give [place] trachea was some time for himself.   Current Outpatient Prescriptions on File Prior to Visit  Medication Sig Dispense Refill  . acetaminophen (TYLENOL) 325 MG tablet Take 325 mg by mouth. One daily as needed      . Ascorbic Acid (VITAMIN C) 1000 MG tablet Take 1,000 mg by mouth daily with breakfast.      . aspirin EC 81 MG tablet Take 81 mg by mouth daily.      . brimonidine-timolol (COMBIGAN) 0.2-0.5 % ophthalmic solution Place 1 drop into both eyes every 12 (twelve) hours.        Marland Kitchen BYSTOLIC 10 MG  tablet TAKE (1) TABLET DAILY FOR HIGH BLOOD PRESSURE.  30 tablet  3  . calcitonin, salmon, (MIACALCIN/FORTICAL) 200 UNIT/ACT nasal spray Use one spray in one nostril once daily. Alternate nostrils daily  3.7 mL  3  . calcium-vitamin D (OSCAL WITH D) 500-200 MG-UNIT per tablet Take one tablet three times daily for calcium supplementation      . clonazePAM (KLONOPIN) 0.5 MG tablet Take 0.25 mg by mouth 3 (three) times daily as needed. Take one three times a day      . furosemide (LASIX) 40 MG tablet TAKE 1 TABLET TWICE DAILY TO PREVENT FLUID RETENTION.  60 tablet  3  . lamoTRIgine (LAMICTAL) 200 MG tablet Take 200 mg by mouth daily.      . multivitamin-lutein (OCUVITE-LUTEIN) CAPS Take 1 capsule by mouth daily.      . naproxen sodium (ANAPROX) 220 MG tablet Take 220 mg by mouth. Take one daily      . potassium chloride SA (K-DUR,KLOR-CON) 20 MEQ tablet TAKE 1 TABLET TWICE DAILY.  60 tablet  1  . PRESCRIPTION MEDICATION 3-4 capsules once. Pt took 3-4 clindamycin before surgery on 04/30/2013 once. Pharmacy did not have record of this-per patient and spouse      . senna (SENOKOT) 8.6 MG TABS Take 2 tablets by mouth at bedtime.      Marland Kitchen  sertraline (ZOLOFT) 100 MG tablet Take 50 mg by mouth daily.       Marland Kitchen SYNTHROID 88 MCG tablet TAKE 1 TABLET ONCE DAILY FOR THYROID.  90 tablet  5  . traZODone (DESYREL) 50 MG tablet 50 mg. Take one tablet at bedtime       No current facility-administered medications on file prior to visit.    Review of Systems  Constitutional: Negative for fever, chills, appetite change and unexpected weight change.  HENT: Positive for hearing loss.   Eyes: Negative.   Respiratory: Positive for shortness of breath. Negative for cough, choking, chest tightness and wheezing.   Cardiovascular: Negative for chest pain, palpitations and leg swelling.  Gastrointestinal: Negative.   Endocrine: Negative.   Musculoskeletal: Positive for arthralgias, back pain, gait problem and myalgias.        Continues to have significant right shoulder discomfort. It has improved since she was last seen by me.  Skin:       Multiple senile ecchymoses.  Neurological: Negative for dizziness, tremors, seizures, facial asymmetry, speech difficulty, weakness and numbness.       Progressive memory loss is present.  Hematological: Negative.   Psychiatric/Behavioral:       History of bipolar disorder, with depression being the dominant affect usually. Next chronic anxiety and nervousness. She is confused because of her progressing dementia. Her episodes of agitation.       Objective:BP 144/88  Pulse 64  Ht 5\' 2"  (1.575 m)  Wt 151 lb (68.493 kg)  BMI 27.61 kg/m2    Physical Exam  Constitutional:  Elderly. Frail. Currently distressed about her shoulder pains.  HENT:   External auditory canals normal. Partial hearing loss bilaterally.  Eyes: EOM are normal. Pupils are equal, round, and reactive to light.  Wears corrective lenses.  Neck: Neck supple. No JVD present. No tracheal deviation present. No thyromegaly present.  Cardiovascular: Normal rate and normal heart sounds.  Exam reveals no gallop and no friction rub.   No murmur heard. Pulmonary/Chest: Effort normal. No respiratory distress. She has no wheezes. She has rales. She exhibits no tenderness.  Abdominal: Soft. Bowel sounds are normal. She exhibits no distension and no mass. There is no tenderness.  Musculoskeletal: She exhibits no edema and no tenderness.  Limited range of motion of the right shoulder. Has pain with elevation of the arm or rotational movements of the shoulder.  Lymphadenopathy:    She has no cervical adenopathy.  Neurological:  Conversational, but there is a significant memory deficit.  Skin:  Multiple senile ecchymoses, otherwise normal  Psychiatric: She has a normal mood and affect. Her behavior is normal.          Assessment & Plan:  1. Dementia in conditions classified elsewhere without behavioral  disturbance(294.10)  we will seek to get the patient to another psychiatrist. We have recommended either Dr. Donell Beers or Dr. Jennelle Human.  2. Abnormality of gait Generalized weakness and unstable gait. Will refer to physical therapy once she has been moved to [facility].  3. Bipolar I disorder, most recent episode mixed Psychiatric evaluation and recommendations will be sought  4. Depressive disorder Psychiatric evaluation and recommendations will be sought  5. COPD GOLD II Recommended resume Spiriva  6. Senile dementia with delusional features Memory disorder has progressed.  7. Osteoarthritis Generalized osteoarthritis. Shoulders are the most uncomfortable area at this time. We again recommended that she resume shoulder exercises. Her daughter, who has been a physical therapist, was in attendance at this meeting  and is in agreement to resuming exercises for the shoulders.

## 2013-09-24 NOTE — Patient Instructions (Signed)
I will check with [facility] about a stay in [facility]. We will schedule an appt with another psychiatrist. Use medication as on your list. Do shoulder exercises.

## 2013-09-29 ENCOUNTER — Other Ambulatory Visit: Payer: Self-pay | Admitting: *Deleted

## 2013-09-29 MED ORDER — NEBIVOLOL HCL 10 MG PO TABS: ORAL_TABLET | ORAL | Status: DC

## 2013-09-30 ENCOUNTER — Other Ambulatory Visit: Payer: Self-pay | Admitting: *Deleted

## 2013-09-30 MED ORDER — METOPROLOL TARTRATE 50 MG PO TABS: ORAL_TABLET | ORAL | Status: DC

## 2013-10-09 ENCOUNTER — Other Ambulatory Visit: Payer: Self-pay | Admitting: Internal Medicine

## 2013-10-13 ENCOUNTER — Other Ambulatory Visit: Payer: Self-pay | Admitting: *Deleted

## 2013-10-13 MED ORDER — FUROSEMIDE 40 MG PO TABS: ORAL_TABLET | ORAL | Status: DC

## 2013-10-16 ENCOUNTER — Encounter: Payer: Self-pay | Admitting: Internal Medicine

## 2013-10-16 ENCOUNTER — Non-Acute Institutional Stay: Payer: Medicare Other | Admitting: Internal Medicine

## 2013-10-16 DIAGNOSIS — F3160 Bipolar disorder, current episode mixed, unspecified: Secondary | ICD-10-CM

## 2013-10-16 DIAGNOSIS — J4489 Other specified chronic obstructive pulmonary disease: Secondary | ICD-10-CM

## 2013-10-16 DIAGNOSIS — R609 Edema, unspecified: Secondary | ICD-10-CM

## 2013-10-16 DIAGNOSIS — R269 Unspecified abnormalities of gait and mobility: Secondary | ICD-10-CM

## 2013-10-16 DIAGNOSIS — K5900 Constipation, unspecified: Secondary | ICD-10-CM

## 2013-10-16 DIAGNOSIS — R0602 Shortness of breath: Secondary | ICD-10-CM

## 2013-10-16 DIAGNOSIS — J449 Chronic obstructive pulmonary disease, unspecified: Secondary | ICD-10-CM

## 2013-10-16 DIAGNOSIS — R5381 Other malaise: Secondary | ICD-10-CM

## 2013-10-16 DIAGNOSIS — F0280 Dementia in other diseases classified elsewhere without behavioral disturbance: Secondary | ICD-10-CM

## 2013-10-16 NOTE — Progress Notes (Signed)
Patient ID: Jenny [name], female   DOB: May 18, 1927, 79 y.o.   MRN: [id number]    Nursing Home Location:  [facility] , room 819  Place of Service: ALF (13)  PCP: Kimber Relic, MD  Code Status: Living will  Allergies  Allergen Reactions  . Penicillins Rash    Chief Complaint  Patient presents with  . Medical Managment of Chronic Issues    Debility    HPI:  Patient was admitted to the assisted living area of [facility] because of regression and her ability to care for herself. She has done well since admission approximately 3 weeks ago. She is walking more. She is engaged socially. Her appetite has improved.  Patient previously had complained of dyspnea and shoulder pain. Both of these problems are doing quite well. She has no dyspnea now.  Patient has a history of manic depressive illness. Her nerves seem improved. Her family had expressed an interest in seeking another psychiatric opinion. We recommended Dr. Chancy Hurter. I don't believe an appointment has been scheduled by them.    Past Medical History  Diagnosis Date  . Hypertension   . Coronary artery disease   . Heart murmur   . Hypothyroidism 05-02-12    tx. Synthroid  . Headache(784.0) 05-02-12    past hx. migrianes-many yrs ago  . Arthritis 05-02-12    osteoarthritis-rt. hip  . Pneumonia 05-02-12    mild case  . 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 Acute upper respiratory infections of unspecified site 2014  . Unspecified venous (peripheral) insufficiency 2013  . Senile osteoporosis 2013  . Unspecified closed fracture of pelvis 10/13/2012  . Cellulitis and abscess of leg, except foot 2013  . Altered mental status 2013  . Chronic pain syndrome 2013  . Urinary tract infection, site not specified   . Hyposmolality and/or hyponatremia 05/2012  . Congestive heart failure, unspecified 05/2012  . Pneumonia, organism unspecified 05/2012  . Pressure ulcer, buttock(707.05) 05/2012  . Hypoxemia 05/2012  .  Reflux esophagitis 3013  . Unspecified constipation 2013  . Acute posthemorrhagic anemia 05/2012  . Coronary atherosclerosis of native coronary artery 05/2012  . Pain in joint, pelvic region and thigh 04/2012  . Dysphagia, unspecified(787.20) 03/2012  . Benign neoplasm of adrenal gland 10/2011  . Alzheimer's disease 06/2011  . Other seborrheic keratosis 2012  . Hypertensive renal disease, benign 2012  . Pain in joint, lower leg 2012  . Memory loss 09/2010  . Spinal stenosis, lumbar region, without neurogenic claudication 2011  . Unspecified vitamin D deficiency 2010  . Unspecified disorder of kidney and ureter 2010  . Edema 2010  . Other malaise and fatigue 2009  . Congenital factor VIII disorder 2009  . Senile dementia with delusional features 2008  . Generalized hyperhidrosis 2007  . Irritable bowel syndrome 2007  . Unspecified chronic bronchitis 2006  . Depression 06/2005     depression only,tx. oral meds  . Shortness of breath 2005  . Anxiety 2004  . Unspecified late effects of cerebrovascular disease 2004  . Migraine with aura, without mention of intractable migraine without mention of status migrainosus 2003  . Tinnitus 2003  . Other symptoms involving cardiovascular system 2003  . Hyperlipidemia 2002    Past Surgical History  Procedure Laterality Date  . Tonsillectomy    . Tubal ligation  05-02-12  .  tummy tuck  05-02-12    many yrs ago  . Cataract extraction, bilateral  05-02-12    bilateral  .  Retinal detachment surgery  05-02-12    left  . Total hip arthroplasty  05/09/2012    Procedure: TOTAL HIP ARTHROPLASTY ANTERIOR APPROACH;  Surgeon: Kathryne Hitch, MD;  Location: [facility]S;  Service: Orthopedics;  Laterality: Right;  . Dental surgery  08/11/13    3 teeth surgical removed by Dr. Elmo Putt Dermatology: Yetta Barre Cardiology: Diona Browner GI: Evette Cristal Orthopedics: Magnus Ivan Orthopedics: Otelia Sergeant Psychiatry: Emerson Monte Pulmonary: Sherene Sires Neurology:  Frances Furbish   Social History: History   Social History  . Marital Status: Married    Spouse Name: N/A    Number of Children: N/A  . Years of Education: N/A   Occupational History  . retired    Social History Main Topics  . Smoking status: Former Smoker -- 1.00 packs/day for 62 years    Types: Cigarettes    Quit date: 10/10/2008  . Smokeless tobacco: Never Used  . Alcohol Use: No  . Drug Use: No  . Sexual Activity: No   Other Topics Concern  . None   Social History Narrative  . None    Family History Family Status  Relation Status Death Age  . Mother Deceased     CVA  . Father Deceased     CVA, diabetes mellitus  . Daughter Alive   . Son Alive   . Maternal Aunt Deceased   . Paternal Uncle Deceased   . Maternal Grandmother Deceased   . Maternal Grandfather Deceased   . Paternal Grandmother Deceased   . Paternal Grandfather Deceased   . Daughter Alive      Medications: Patient's Medications  New Prescriptions   No medications on file  Previous Medications   ACETAMINOPHEN (TYLENOL) 325 MG TABLET    Take 325 mg by mouth. One daily as needed   ASCORBIC ACID (VITAMIN C) 1000 MG TABLET    Take 1,000 mg by mouth daily with breakfast.   ASPIRIN EC 81 MG TABLET    Take 81 mg by mouth daily.   BRIMONIDINE-TIMOLOL (COMBIGAN) 0.2-0.5 % OPHTHALMIC SOLUTION    Place 1 drop into both eyes every 12 (twelve) hours.     CALCITONIN, SALMON, (MIACALCIN/FORTICAL) 200 UNIT/ACT NASAL SPRAY    Use one spray in one nostril once daily. Alternate nostrils daily   CALCIUM-VITAMIN D (OSCAL WITH D) 500-200 MG-UNIT PER TABLET    Take one tablet three times daily for calcium supplementation   CLONAZEPAM (KLONOPIN) 0.5 MG TABLET    Take 0.25 mg by mouth 3 (three) times daily as needed. Take one three times a day   FUROSEMIDE (LASIX) 40 MG TABLET    Take one tablet twice daily to prevent fluid retention.   LAMOTRIGINE (LAMICTAL) 200 MG TABLET    Take 200 mg by mouth daily.   METOPROLOL  (LOPRESSOR) 50 MG TABLET    Take one tablet by mouth twice daily to control blood pressure   MULTIVITAMIN-LUTEIN (OCUVITE-LUTEIN) CAPS    Take 1 capsule by mouth daily.   NAPROXEN SODIUM (ANAPROX) 220 MG TABLET    Take 220 mg by mouth. Take one daily   POTASSIUM CHLORIDE SA (K-DUR,KLOR-CON) 20 MEQ TABLET    TAKE 1 TABLET TWICE DAILY.   PRESCRIPTION MEDICATION    3-4 capsules once. Pt took 3-4 clindamycin before surgery on 04/30/2013 once. Pharmacy did not have record of this-per patient and spouse   SENNA (SENOKOT) 8.6 MG TABS    Take 2 tablets by mouth at bedtime.   SERTRALINE (ZOLOFT) 100 MG TABLET  Take 50 mg by mouth daily.    SYNTHROID 88 MCG TABLET    TAKE 1 TABLET ONCE DAILY FOR THYROID.   TRAZODONE (DESYREL) 50 MG TABLET    50 mg. Take one tablet at bedtime  Modified Medications   No medications on file  Discontinued Medications   No medications on file    Immunization History  Administered Date(s) Administered  . Influenza Whole 08/12/2012, 08/26/2013  . Pneumococcal Polysaccharide-23 11/13/1999  . Td 11/12/2006     Review of Systems  Constitutional: Negative for fever, chills, weight loss, malaise/fatigue and diaphoresis.  HENT: Positive for hearing loss. Negative for congestion, ear pain and sore throat.   Eyes: Negative for pain, discharge and redness.  Respiratory: Negative for cough, hemoptysis, sputum production, shortness of breath and wheezing.   Cardiovascular: Negative for chest pain, palpitations, orthopnea, claudication, leg swelling and PND.  Gastrointestinal: Negative for heartburn, nausea, vomiting, abdominal pain, diarrhea, constipation and blood in stool.  Genitourinary: Positive for frequency. Negative for dysuria, urgency, hematuria and flank pain.  Musculoskeletal: Positive for joint pain. Negative for back pain, falls, myalgias and neck pain.  Skin: Negative for itching and rash.  Neurological: Negative for dizziness, tingling, tremors, sensory change,  speech change, focal weakness, seizures, loss of consciousness, weakness and headaches.  Endo/Heme/Allergies: Negative for environmental allergies and polydipsia. Does not bruise/bleed easily.  Psychiatric/Behavioral: Positive for depression and memory loss. Negative for hallucinations. The patient is nervous/anxious. The patient does not have insomnia.      Filed Vitals:   10/16/13 1543  BP: 120/80  Pulse: 72  Temp: 98.6 F (37 C)  Resp: 14   Physical Exam  Constitutional:  Elderly. Frail. Currently distressed about her shoulder pains.  HENT:   External auditory canals normal. Partial hearing loss bilaterally.  Eyes: EOM are normal. Pupils are equal, round, and reactive to light.  Wears corrective lenses.  Neck: Neck supple. No JVD present. No tracheal deviation present. No thyromegaly present.  Cardiovascular: Normal rate and normal heart sounds.  Exam reveals no gallop and no friction rub.   No murmur heard. Pulmonary/Chest: Effort normal. No respiratory distress. She has no wheezes. She has rales. She exhibits no tenderness.  Abdominal: Soft. Bowel sounds are normal. She exhibits no distension and no mass. There is no tenderness.  Musculoskeletal: She exhibits no edema and no tenderness.  Limited range of motion of the right shoulder. Has pain with elevation of the arm or rotational movements of the shoulder.  Lymphadenopathy:    She has no cervical adenopathy.  Neurological:  Conversational, but there is a significant memory deficit.  Skin:  Multiple senile ecchymoses, otherwise normal  Psychiatric: She has a normal mood and affect. Her behavior is normal.      Labs reviewed: CBC: @LAB1YEARLOOKBACK (WBC:3, HGB:3, MCV:3, PLATELET:3) Basic Metabolic Panel: ) Recent Labs  06/15/13 1531  NA 131*  K 5.0  CL 95*  CO2 27  GLUCOSE 98  BUN 21  CREATININE 1.3*  CALCIUM 9.4   Liver Function Tests: No results found for this basename: AST, ALT, ALKPHOS, BILITOT, PROT,  ALBUMIN,  in the last 8760 hours No results found for this basename: LIPASE, AMYLASE,  in the last 8760 hours No results found for this basename: AMMONIA,  in the last 8760 hours CBC:  Recent Labs  06/15/13 1531  WBC 9.9  NEUTROABS 5.6  HGB 11.8*  HCT 35.7*  MCV 83.1  PLT 469.0*   Cardiac Enzymes: No results found for this basename: CKTOTAL,  CKMB, CKMBINDEX, TROPONINI,  in the last 8760 hours BNP: No components found with this basename: POCBNP,  CBG: No results found for this basename: GLUCAP,  in the last 8760 hours TSH: No results found for this basename: TSH,  in the last 8760 hours A1C: No results found for this basename: HGBA1C   Lipid Panel: No results found for this basename: CHOL, HDL, LDLCALC, TRIG, CHOLHDL, LDLDIRECT,  in the last 8760 hours   Assessment/Plan 1. Debility Patient appears stronger than when I saw her in November 2014. She is doing more for herself.   2. Shortness of breath Improved   3. Abnormality of gait continues to use rolling walker with 4 wheels and brakes   4. Bipolar I disorder, most recent episode mixed  alert and cheerful today. No signs of depression.   5. Edema  resolved on furosemide   6. Dementia in conditions classified elsewhere without behavioral disturbance(294.10)  patient is unable to recall recent visits with me   7. COPD GOLD II  seems improved   8. Constipation Improved   Plan: Family would like patient to remain at the assisted living level of care. Her husband is not in good health and had great difficulty with having to "do everything for her". Patient is to requiring significant assistance with virtually all ADL. Forms were completed for insurance.

## 2013-11-15 ENCOUNTER — Emergency Department (HOSPITAL_COMMUNITY): Payer: Medicare Other

## 2013-11-15 ENCOUNTER — Inpatient Hospital Stay (HOSPITAL_COMMUNITY): Payer: Medicare Other

## 2013-11-15 ENCOUNTER — Inpatient Hospital Stay (HOSPITAL_COMMUNITY)
Admission: EM | Admit: 2013-11-15 | Discharge: 2013-11-20 | DRG: 291 | Disposition: A | Discharge status: Skilled Nursing Facility | Payer: Medicare Other | Attending: Internal Medicine | Admitting: Internal Medicine

## 2013-11-15 ENCOUNTER — Encounter (HOSPITAL_COMMUNITY): Payer: Self-pay | Admitting: Emergency Medicine

## 2013-11-15 DIAGNOSIS — M25519 Pain in unspecified shoulder: Secondary | ICD-10-CM

## 2013-11-15 DIAGNOSIS — G894 Chronic pain syndrome: Secondary | ICD-10-CM | POA: Diagnosis present

## 2013-11-15 DIAGNOSIS — M25569 Pain in unspecified knee: Secondary | ICD-10-CM

## 2013-11-15 DIAGNOSIS — F22 Delusional disorders: Secondary | ICD-10-CM

## 2013-11-15 DIAGNOSIS — I129 Hypertensive chronic kidney disease with stage 1 through stage 4 chronic kidney disease, or unspecified chronic kidney disease: Secondary | ICD-10-CM

## 2013-11-15 DIAGNOSIS — F419 Anxiety disorder, unspecified: Secondary | ICD-10-CM | POA: Diagnosis present

## 2013-11-15 DIAGNOSIS — R5383 Other fatigue: Secondary | ICD-10-CM | POA: Diagnosis present

## 2013-11-15 DIAGNOSIS — R42 Dizziness and giddiness: Secondary | ICD-10-CM

## 2013-11-15 DIAGNOSIS — I5033 Acute on chronic diastolic (congestive) heart failure: Principal | ICD-10-CM | POA: Diagnosis present

## 2013-11-15 DIAGNOSIS — E559 Vitamin D deficiency, unspecified: Secondary | ICD-10-CM

## 2013-11-15 DIAGNOSIS — J449 Chronic obstructive pulmonary disease, unspecified: Secondary | ICD-10-CM

## 2013-11-15 DIAGNOSIS — I509 Heart failure, unspecified: Secondary | ICD-10-CM | POA: Diagnosis present

## 2013-11-15 DIAGNOSIS — Z833 Family history of diabetes mellitus: Secondary | ICD-10-CM

## 2013-11-15 DIAGNOSIS — F329 Major depressive disorder, single episode, unspecified: Secondary | ICD-10-CM | POA: Diagnosis present

## 2013-11-15 DIAGNOSIS — Z880 Allergy status to penicillin: Secondary | ICD-10-CM

## 2013-11-15 DIAGNOSIS — E039 Hypothyroidism, unspecified: Secondary | ICD-10-CM | POA: Diagnosis present

## 2013-11-15 DIAGNOSIS — D649 Anemia, unspecified: Secondary | ICD-10-CM | POA: Diagnosis present

## 2013-11-15 DIAGNOSIS — R5381 Other malaise: Secondary | ICD-10-CM | POA: Diagnosis present

## 2013-11-15 DIAGNOSIS — I4891 Unspecified atrial fibrillation: Secondary | ICD-10-CM | POA: Diagnosis present

## 2013-11-15 DIAGNOSIS — S32519D Fracture of superior rim of unspecified pubis, subsequent encounter for fracture with routine healing: Secondary | ICD-10-CM

## 2013-11-15 DIAGNOSIS — F0392 Unspecified dementia, unspecified severity, with psychotic disturbance: Secondary | ICD-10-CM

## 2013-11-15 DIAGNOSIS — K1370 Unspecified lesions of oral mucosa: Secondary | ICD-10-CM | POA: Diagnosis present

## 2013-11-15 DIAGNOSIS — N183 Chronic kidney disease, stage 3 unspecified: Secondary | ICD-10-CM | POA: Diagnosis present

## 2013-11-15 DIAGNOSIS — Z79899 Other long term (current) drug therapy: Secondary | ICD-10-CM

## 2013-11-15 DIAGNOSIS — J42 Unspecified chronic bronchitis: Secondary | ICD-10-CM

## 2013-11-15 DIAGNOSIS — K5900 Constipation, unspecified: Secondary | ICD-10-CM | POA: Diagnosis present

## 2013-11-15 DIAGNOSIS — E871 Hypo-osmolality and hyponatremia: Secondary | ICD-10-CM | POA: Diagnosis present

## 2013-11-15 DIAGNOSIS — F3289 Other specified depressive episodes: Secondary | ICD-10-CM | POA: Diagnosis present

## 2013-11-15 DIAGNOSIS — G43109 Migraine with aura, not intractable, without status migrainosus: Secondary | ICD-10-CM

## 2013-11-15 DIAGNOSIS — F32A Depression, unspecified: Secondary | ICD-10-CM | POA: Diagnosis present

## 2013-11-15 DIAGNOSIS — Z96649 Presence of unspecified artificial hip joint: Secondary | ICD-10-CM

## 2013-11-15 DIAGNOSIS — R609 Edema, unspecified: Secondary | ICD-10-CM

## 2013-11-15 DIAGNOSIS — F0280 Dementia in other diseases classified elsewhere without behavioral disturbance: Secondary | ICD-10-CM | POA: Diagnosis present

## 2013-11-15 DIAGNOSIS — R0602 Shortness of breath: Secondary | ICD-10-CM

## 2013-11-15 DIAGNOSIS — K922 Gastrointestinal hemorrhage, unspecified: Secondary | ICD-10-CM | POA: Diagnosis present

## 2013-11-15 DIAGNOSIS — I480 Paroxysmal atrial fibrillation: Secondary | ICD-10-CM | POA: Diagnosis not present

## 2013-11-15 DIAGNOSIS — E785 Hyperlipidemia, unspecified: Secondary | ICD-10-CM | POA: Diagnosis present

## 2013-11-15 DIAGNOSIS — E876 Hypokalemia: Secondary | ICD-10-CM | POA: Diagnosis present

## 2013-11-15 DIAGNOSIS — I2510 Atherosclerotic heart disease of native coronary artery without angina pectoris: Secondary | ICD-10-CM

## 2013-11-15 DIAGNOSIS — I6990 Unspecified sequelae of unspecified cerebrovascular disease: Secondary | ICD-10-CM

## 2013-11-15 DIAGNOSIS — R0989 Other specified symptoms and signs involving the circulatory and respiratory systems: Secondary | ICD-10-CM

## 2013-11-15 DIAGNOSIS — I5031 Acute diastolic (congestive) heart failure: Secondary | ICD-10-CM

## 2013-11-15 DIAGNOSIS — M810 Age-related osteoporosis without current pathological fracture: Secondary | ICD-10-CM | POA: Diagnosis present

## 2013-11-15 DIAGNOSIS — F3160 Bipolar disorder, current episode mixed, unspecified: Secondary | ICD-10-CM

## 2013-11-15 DIAGNOSIS — I6930 Unspecified sequelae of cerebral infarction: Secondary | ICD-10-CM

## 2013-11-15 DIAGNOSIS — Q7649 Other congenital malformations of spine, not associated with scoliosis: Secondary | ICD-10-CM

## 2013-11-15 DIAGNOSIS — K589 Irritable bowel syndrome without diarrhea: Secondary | ICD-10-CM

## 2013-11-15 DIAGNOSIS — R269 Unspecified abnormalities of gait and mobility: Secondary | ICD-10-CM

## 2013-11-15 DIAGNOSIS — Z9849 Cataract extraction status, unspecified eye: Secondary | ICD-10-CM

## 2013-11-15 DIAGNOSIS — F411 Generalized anxiety disorder: Secondary | ICD-10-CM | POA: Diagnosis present

## 2013-11-15 DIAGNOSIS — F0390 Unspecified dementia without behavioral disturbance: Secondary | ICD-10-CM

## 2013-11-15 DIAGNOSIS — J189 Pneumonia, unspecified organism: Secondary | ICD-10-CM | POA: Diagnosis present

## 2013-11-15 DIAGNOSIS — L821 Other seborrheic keratosis: Secondary | ICD-10-CM

## 2013-11-15 DIAGNOSIS — M169 Osteoarthritis of hip, unspecified: Secondary | ICD-10-CM

## 2013-11-15 DIAGNOSIS — M48061 Spinal stenosis, lumbar region without neurogenic claudication: Secondary | ICD-10-CM

## 2013-11-15 DIAGNOSIS — Z9851 Tubal ligation status: Secondary | ICD-10-CM

## 2013-11-15 DIAGNOSIS — R079 Chest pain, unspecified: Secondary | ICD-10-CM

## 2013-11-15 DIAGNOSIS — G309 Alzheimer's disease, unspecified: Secondary | ICD-10-CM | POA: Diagnosis present

## 2013-11-15 DIAGNOSIS — Z87891 Personal history of nicotine dependence: Secondary | ICD-10-CM

## 2013-11-15 DIAGNOSIS — M1990 Unspecified osteoarthritis, unspecified site: Secondary | ICD-10-CM

## 2013-11-15 LAB — CBC WITH DIFFERENTIAL/PLATELET
BASOS PCT: 0 % (ref 0–1)
Basophils Absolute: 0 10*3/uL (ref 0.0–0.1)
EOS ABS: 0.5 10*3/uL (ref 0.0–0.7)
EOS PCT: 3 % (ref 0–5)
HEMATOCRIT: 24.1 % — AB (ref 36.0–46.0)
HEMOGLOBIN: 8 g/dL — AB (ref 12.0–15.0)
Lymphocytes Relative: 7 % — ABNORMAL LOW (ref 12–46)
Lymphs Abs: 1.1 10*3/uL (ref 0.7–4.0)
MCH: 23.4 pg — AB (ref 26.0–34.0)
MCHC: 33.2 g/dL (ref 30.0–36.0)
MCV: 70.5 fL — AB (ref 78.0–100.0)
MONO ABS: 2.7 10*3/uL — AB (ref 0.1–1.0)
MONOS PCT: 15 % — AB (ref 3–12)
NEUTROS ABS: 13 10*3/uL — AB (ref 1.7–7.7)
Neutrophils Relative %: 75 % (ref 43–77)
Platelets: 552 10*3/uL — ABNORMAL HIGH (ref 150–400)
RBC: 3.42 MIL/uL — ABNORMAL LOW (ref 3.87–5.11)
RDW: 15.8 % — AB (ref 11.5–15.5)
WBC: 17.3 10*3/uL — ABNORMAL HIGH (ref 4.0–10.5)

## 2013-11-15 LAB — COMPREHENSIVE METABOLIC PANEL
ALBUMIN: 2.7 g/dL — AB (ref 3.5–5.2)
ALT: 13 U/L (ref 0–35)
AST: 15 U/L (ref 0–37)
Alkaline Phosphatase: 83 U/L (ref 39–117)
BUN: 29 mg/dL — AB (ref 6–23)
CALCIUM: 9 mg/dL (ref 8.4–10.5)
CHLORIDE: 92 meq/L — AB (ref 96–112)
CO2: 24 mEq/L (ref 19–32)
CREATININE: 1.47 mg/dL — AB (ref 0.50–1.10)
GFR calc Af Amer: 37 mL/min — ABNORMAL LOW (ref >90)
GFR calc non Af Amer: 32 mL/min — ABNORMAL LOW (ref >90)
Glucose, Bld: 155 mg/dL — ABNORMAL HIGH (ref 70–99)
Potassium: 4.6 mEq/L (ref 3.7–5.3)
Sodium: 131 mEq/L — ABNORMAL LOW (ref 137–147)
Total Protein: 6.7 g/dL (ref 6.0–8.3)

## 2013-11-15 LAB — CBC
HCT: 22.9 % — ABNORMAL LOW (ref 36.0–46.0)
Hemoglobin: 7.4 g/dL — ABNORMAL LOW (ref 12.0–15.0)
MCH: 23.3 pg — AB (ref 26.0–34.0)
MCHC: 32.3 g/dL (ref 30.0–36.0)
MCV: 72 fL — ABNORMAL LOW (ref 78.0–100.0)
Platelets: 493 10*3/uL — ABNORMAL HIGH (ref 150–400)
RBC: 3.18 MIL/uL — ABNORMAL LOW (ref 3.87–5.11)
RDW: 15.7 % — ABNORMAL HIGH (ref 11.5–15.5)
WBC: 13.8 10*3/uL — ABNORMAL HIGH (ref 4.0–10.5)

## 2013-11-15 LAB — PRO B NATRIURETIC PEPTIDE: Pro B Natriuretic peptide (BNP): 4643 pg/mL — ABNORMAL HIGH (ref 0–450)

## 2013-11-15 LAB — INFLUENZA PANEL BY PCR (TYPE A & B)
H1N1 flu by pcr: NOT DETECTED
Influenza A By PCR: NEGATIVE
Influenza B By PCR: NEGATIVE

## 2013-11-15 LAB — CREATININE, SERUM
Creatinine, Ser: 1.29 mg/dL — ABNORMAL HIGH (ref 0.50–1.10)
GFR calc Af Amer: 43 mL/min — ABNORMAL LOW (ref >90)
GFR calc non Af Amer: 37 mL/min — ABNORMAL LOW (ref >90)

## 2013-11-15 LAB — TROPONIN I
Troponin I: <0.3 ng/mL (ref <0.30)
Troponin I: <0.3 ng/mL (ref <0.30)

## 2013-11-15 MED ORDER — SODIUM CHLORIDE 0.9 % IJ SOLN
3.0000 mL | Freq: Two times a day (BID) | INTRAMUSCULAR | Status: DC
  Administered 2013-11-16 – 2013-11-17 (×4): 3 mL via INTRAVENOUS
  Administered 2013-11-18: 22:00:00 via INTRAVENOUS
  Administered 2013-11-18 – 2013-11-19 (×2): 3 mL via INTRAVENOUS

## 2013-11-15 MED ORDER — OXYCODONE HCL 5 MG PO TABS
5.0000 mg | ORAL_TABLET | ORAL | Status: DC | PRN
  Administered 2013-11-19: 5 mg via ORAL
  Filled 2013-11-15: qty 1

## 2013-11-15 MED ORDER — NEBIVOLOL HCL 10 MG PO TABS
10.0000 mg | ORAL_TABLET | Freq: Every day | ORAL | Status: DC
  Administered 2013-11-15 – 2013-11-20 (×6): 10 mg via ORAL
  Filled 2013-11-15 (×7): qty 1

## 2013-11-15 MED ORDER — BRIMONIDINE TARTRATE-TIMOLOL 0.2-0.5 % OP SOLN: 1.0000 [drp] | Freq: Two times a day (BID) | OPHTHALMIC | Status: DC

## 2013-11-15 MED ORDER — ASPIRIN EC 81 MG PO TBEC
81.0000 mg | DELAYED_RELEASE_TABLET | Freq: Every day | ORAL | Status: DC
  Administered 2013-11-15 – 2013-11-20 (×6): 81 mg via ORAL
  Filled 2013-11-15 (×7): qty 1

## 2013-11-15 MED ORDER — SODIUM CHLORIDE 0.9 % IV SOLN
INTRAVENOUS | Status: AC
  Administered 2013-11-15: 12:00:00 via INTRAVENOUS

## 2013-11-15 MED ORDER — CLONAZEPAM 0.5 MG PO TABS
0.2500 mg | ORAL_TABLET | Freq: Three times a day (TID) | ORAL | Status: DC | PRN
  Administered 2013-11-15 – 2013-11-19 (×9): 0.25 mg via ORAL
  Filled 2013-11-15 (×8): qty 1

## 2013-11-15 MED ORDER — MORPHINE SULFATE 2 MG/ML IJ SOLN
2.0000 mg | Freq: Once | INTRAMUSCULAR | Status: DC
  Filled 2013-11-15: qty 1

## 2013-11-15 MED ORDER — MAGIC MOUTHWASH W/LIDOCAINE
15.0000 mL | Freq: Three times a day (TID) | ORAL | Status: DC | PRN
  Filled 2013-11-15: qty 15

## 2013-11-15 MED ORDER — TRAZODONE HCL 50 MG PO TABS
50.0000 mg | ORAL_TABLET | Freq: Every day | ORAL | Status: DC
  Administered 2013-11-15 – 2013-11-17 (×2): 50 mg via ORAL
  Filled 2013-11-15 (×3): qty 1

## 2013-11-15 MED ORDER — DIPHENHYDRAMINE HCL 50 MG/ML IJ SOLN
25.0000 mg | Freq: Once | INTRAMUSCULAR | Status: AC
  Administered 2013-11-15: 25 mg via INTRAVENOUS
  Filled 2013-11-15: qty 1

## 2013-11-15 MED ORDER — DEXTROSE 5 % IV SOLN
1.0000 g | Freq: Three times a day (TID) | INTRAVENOUS | Status: DC
  Administered 2013-11-15 – 2013-11-19 (×11): 1 g via INTRAVENOUS
  Filled 2013-11-15 (×15): qty 1

## 2013-11-15 MED ORDER — CALCITONIN (SALMON) 200 UNIT/ACT NA SOLN
1.0000 | Freq: Every day | NASAL | Status: DC
Start: 2013-11-15 — End: 2013-11-20
  Administered 2013-11-15 – 2013-11-19 (×4): 1 via NASAL
  Filled 2013-11-15 (×2): qty 3.7

## 2013-11-15 MED ORDER — PREDNISONE 5 MG PO TABS
5.0000 mg | ORAL_TABLET | Freq: Every day | ORAL | Status: AC
  Administered 2013-11-16 – 2013-11-18 (×3): 5 mg via ORAL
  Filled 2013-11-15 (×3): qty 1

## 2013-11-15 MED ORDER — ACETAMINOPHEN 325 MG PO TABS: 325.0000 mg | ORAL_TABLET | Freq: Every day | ORAL | Status: DC | PRN

## 2013-11-15 MED ORDER — LEVOFLOXACIN IN D5W 750 MG/150ML IV SOLN
750.0000 mg | Freq: Once | INTRAVENOUS | Status: DC
  Administered 2013-11-15: 750 mg via INTRAVENOUS
  Filled 2013-11-15: qty 150

## 2013-11-15 MED ORDER — METHYLPREDNISOLONE SODIUM SUCC 125 MG IJ SOLR
125.0000 mg | Freq: Once | INTRAMUSCULAR | Status: AC
  Administered 2013-11-15: 125 mg via INTRAVENOUS
  Filled 2013-11-15: qty 2

## 2013-11-15 MED ORDER — DEXTROSE 5 % IV SOLN
500.0000 mg | INTRAVENOUS | Status: DC
  Administered 2013-11-15 – 2013-11-18 (×4): 500 mg via INTRAVENOUS
  Filled 2013-11-15 (×5): qty 500

## 2013-11-15 MED ORDER — SERTRALINE HCL 50 MG PO TABS
50.0000 mg | ORAL_TABLET | Freq: Every day | ORAL | Status: DC
  Administered 2013-11-15 – 2013-11-18 (×4): 50 mg via ORAL
  Filled 2013-11-15 (×6): qty 1

## 2013-11-15 MED ORDER — TIMOLOL MALEATE 0.5 % OP SOLN
1.0000 [drp] | Freq: Two times a day (BID) | OPHTHALMIC | Status: DC
  Administered 2013-11-15 – 2013-11-20 (×11): 1 [drp] via OPHTHALMIC
  Filled 2013-11-15: qty 5

## 2013-11-15 MED ORDER — SODIUM CHLORIDE 0.9 % IV BOLUS (SEPSIS)
500.0000 mL | Freq: Once | INTRAVENOUS | Status: AC
  Administered 2013-11-15: 500 mL via INTRAVENOUS

## 2013-11-15 MED ORDER — LAMOTRIGINE 200 MG PO TABS
200.0000 mg | ORAL_TABLET | Freq: Every day | ORAL | Status: DC
  Administered 2013-11-15 – 2013-11-20 (×6): 200 mg via ORAL
  Filled 2013-11-15 (×7): qty 1

## 2013-11-15 MED ORDER — LEVOTHYROXINE SODIUM 88 MCG PO TABS
88.0000 ug | ORAL_TABLET | Freq: Every day | ORAL | Status: DC
  Administered 2013-11-15 – 2013-11-20 (×6): 88 ug via ORAL
  Filled 2013-11-15 (×7): qty 1

## 2013-11-15 MED ORDER — ONDANSETRON HCL 4 MG PO TABS: 4.0000 mg | ORAL_TABLET | Freq: Four times a day (QID) | ORAL | Status: DC | PRN

## 2013-11-15 MED ORDER — ONDANSETRON HCL 4 MG/2ML IJ SOLN
4.0000 mg | Freq: Four times a day (QID) | INTRAMUSCULAR | Status: DC | PRN
Start: 2013-11-15 — End: 2013-11-20

## 2013-11-15 MED ORDER — BRIMONIDINE TARTRATE 0.2 % OP SOLN
1.0000 [drp] | Freq: Two times a day (BID) | OPHTHALMIC | Status: DC
  Administered 2013-11-15 – 2013-11-20 (×11): 1 [drp] via OPHTHALMIC
  Filled 2013-11-15: qty 5

## 2013-11-15 MED ORDER — ENOXAPARIN SODIUM 40 MG/0.4ML ~~LOC~~ SOLN
40.0000 mg | SUBCUTANEOUS | Status: DC
Start: 2013-11-15 — End: 2013-11-17
  Administered 2013-11-15 – 2013-11-17 (×2): 40 mg via SUBCUTANEOUS
  Filled 2013-11-15 (×3): qty 0.4

## 2013-11-15 NOTE — ED Notes (Signed)
Patient family called due to rash developing on right arm at IV site. Patient states arm is aching and itching. Hives noted to right arm . Levaquin infusion stopped, IV flushed and blood returned.  Dr Darrick Meigs called ordered IV benadryl and IV solumedrol.   Patient states within 10 minutes of infusion stopped and meds given that arm felt better and hives were decreasing.

## 2013-11-15 NOTE — H&P (Signed)
PCP:   Estill Dooms, MD   Chief Complaint:  Fatigue  HPI: 80 year old female with past medical history of hypertension, hypothyroidism, CAD, CHF who resides at assisted living facility was brought to the ED after patient has been experiencing generalized fatigue over the past 2 weeks. Patient also complains of chest pain which has been intermittent and not associated with shortness of breath. She also denies nausea vomiting or diarrhea. She denies fever no dysuria. Patient was seen by interpretation at the assisted living facility and was started on prednisone 10 mg by mouth daily on 12/3 for possible allergic reaction, as patient complained of burning in her tongue. Today in the ED patient is found to have leukocytosis, portable chest x-ray showed possible left lower lobe infiltrate.  Allergies:   Allergies  Allergen Reactions  . Penicillins Rash      Past Medical History  Diagnosis Date  . Hypertension   . Coronary artery disease   . Heart murmur   . Hypothyroidism 05-02-12    tx. Synthroid  . Headache(784.0) 05-02-12    past hx. migrianes-many yrs ago  . Arthritis 05-02-12    osteoarthritis-rt. hip  . Pneumonia 05-02-12    mild case  . 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 Acute upper respiratory infections of unspecified site 2014  . Unspecified venous (peripheral) insufficiency 2013  . Senile osteoporosis 2013  . Unspecified closed fracture of pelvis 10/13/2012  . Cellulitis and abscess of leg, except foot 2013  . Altered mental status 2013  . Chronic pain syndrome 2013  . Urinary tract infection, site not specified   . Hyposmolality and/or hyponatremia 05/2012  . Congestive heart failure, unspecified 05/2012  . Pneumonia, organism unspecified 05/2012  . Pressure ulcer, buttock(707.05) 05/2012  . Hypoxemia 05/2012  . Reflux esophagitis 3013  . Unspecified constipation 2013  . Acute posthemorrhagic anemia 05/2012  . Coronary atherosclerosis of native coronary artery 05/2012  .  Pain in joint, pelvic region and thigh 04/2012  . Dysphagia, unspecified(787.20) 03/2012  . Benign neoplasm of adrenal gland 10/2011  . Alzheimer's disease 06/2011  . Other seborrheic keratosis 2012  . Hypertensive renal disease, benign 2012  . Pain in joint, lower leg 2012  . Memory loss 09/2010  . Spinal stenosis, lumbar region, without neurogenic claudication 2011  . Unspecified vitamin D deficiency 2010  . Unspecified disorder of kidney and ureter 2010  . Edema 2010  . Other malaise and fatigue 2009  . Congenital factor VIII disorder 2009  . Senile dementia with delusional features 2008  . Generalized hyperhidrosis 2007  . Irritable bowel syndrome 2007  . Unspecified chronic bronchitis 2006  . Depression 06/2005     depression only,tx. oral meds  . Shortness of breath 2005  . Anxiety 2004  . Unspecified late effects of cerebrovascular disease 2004  . Migraine with aura, without mention of intractable migraine without mention of status migrainosus 2003  . Tinnitus 2003  . Other symptoms involving cardiovascular system 2003  . Hyperlipidemia 2002    Past Surgical History  Procedure Laterality Date  . Tonsillectomy    . Tubal ligation  05-02-12  .  tummy tuck  05-02-12    many yrs ago  . Cataract extraction, bilateral  05-02-12    bilateral  . Retinal detachment surgery  05-02-12    left  . Total hip arthroplasty  05/09/2012    Procedure: TOTAL HIP ARTHROPLASTY ANTERIOR APPROACH;  Surgeon: Mcarthur Rossetti, MD;  Location: [facility];  Service: Orthopedics;  Laterality: Right;  . Dental surgery  08/11/13    3 teeth surgical removed by Dr. Lewanda Rife    Prior to Admission medications   Medication Sig Start Date End Date Taking? Authorizing Provider  acetaminophen (TYLENOL) 325 MG tablet Take 325 mg by mouth daily as needed for mild pain. One daily as needed   Yes Historical Provider, MD  Ascorbic Acid (VITAMIN C) 1000 MG tablet Take 1,000 mg by mouth daily with breakfast.   Yes  Historical Provider, MD  aspirin EC 81 MG tablet Take 81 mg by mouth daily.   Yes Historical Provider, MD  brimonidine-timolol (COMBIGAN) 0.2-0.5 % ophthalmic solution Place 1 drop into both eyes every 12 (twelve) hours.    Yes Historical Provider, MD  calcitonin, salmon, (MIACALCIN/FORTICAL) 200 UNIT/ACT nasal spray Place 1 spray into alternate nostrils daily. Use one spray in one nostril once daily. Alternate nostrils daily 07/23/13  Yes Tiffany L Reed, DO  calcium-vitamin D (OSCAL WITH D) 500-200 MG-UNIT per tablet Take 1 tablet by mouth 3 (three) times daily. Take one tablet three times daily for calcium supplementation 03/12/13  Yes Estill Dooms, MD  furosemide (LASIX) 40 MG tablet Take 40 mg by mouth 2 (two) times daily. Take one tablet twice daily to prevent fluid retention. 10/13/13  Yes Mahima Bubba Camp, MD  lamoTRIgine (LAMICTAL) 200 MG tablet Take 200 mg by mouth daily.   Yes Historical Provider, MD  levothyroxine (SYNTHROID, LEVOTHROID) 88 MCG tablet Take 88 mcg by mouth daily before breakfast.   Yes Historical Provider, MD  multivitamin-lutein (OCUVITE-LUTEIN) CAPS Take 1 capsule by mouth daily.   Yes Historical Provider, MD  naproxen sodium (ANAPROX) 220 MG tablet Take 220 mg by mouth daily. Take one daily   Yes Historical Provider, MD  nebivolol (BYSTOLIC) 10 MG tablet Take 10 mg by mouth daily.   Yes Historical Provider, MD  potassium chloride SA (K-DUR,KLOR-CON) 20 MEQ tablet Take 20 mEq by mouth 2 (two) times daily.   Yes Historical Provider, MD  senna (SENOKOT) 8.6 MG TABS Take 2 tablets by mouth at bedtime.   Yes Historical Provider, MD  sertraline (ZOLOFT) 100 MG tablet Take 50 mg by mouth daily.    Yes Historical Provider, MD  traZODone (DESYREL) 50 MG tablet Take 50 mg by mouth at bedtime. Take one tablet at bedtime 02/21/13  Yes Historical Provider, MD  clonazePAM (KLONOPIN) 0.5 MG tablet Take 0.25 mg by mouth 3 (three) times daily as needed. Take one three times a day    Historical  Provider, MD    Social History:  reports that she quit smoking about 5 years ago. Her smoking use included Cigarettes. She has a 62 pack-year smoking history. She has never used smokeless tobacco. She reports that she does not drink alcohol or use illicit drugs.  Family History  Problem Relation Age of Onset  . Diabetes Father      All the positives are listed in BOLD  Review of Systems:  HEENT: Headache, blurred vision, runny nose, sore throat Neck: Hypothyroidism, hyperthyroidism,,lymphadenopathy Chest : Shortness of breath, history of COPD, Asthma Heart : Chest pain, history of coronary arterey disease GI:  Nausea, vomiting, diarrhea, constipation, GERD GU: Dysuria, urgency, frequency of urination, hematuria Neuro: Stroke, seizures, syncope Psych: Depression, anxiety, hallucinations   Physical Exam: Blood pressure 133/65, pulse 70, temperature 99.8 F (37.7 C), temperature source Oral, resp. rate 25, SpO2 96.00%. Constitutional:   Patient is a well-developed and well-nourished female* in no acute distress and cooperative  with exam. Head: Normocephalic and atraumatic Mouth: Mucus membranes moist Eyes: PERRL, EOMI, conjunctivae normal Neck: Supple, No Thyromegaly Cardiovascular: RRR, +3 / 6 systolic murmur auscultated at the mitral area Pulmonary/Chest: Bibasilar crackles Abdominal: Soft. Non-tender, non-distended, bowel sounds are normal, no masses, organomegaly, or guarding present.  Neurological: A&O x3, Strenght is normal and symmetric bilaterally, cranial nerve II-XII are grossly intact, no focal motor deficit, sensory intact to light touch bilaterally.  Extremities : No Cyanosis, Clubbing or Edema   Labs on Admission:  Results for orders placed during the hospital encounter of 11/15/13 (from the past 48 hour(s))  CBC WITH DIFFERENTIAL     Status: Abnormal   Collection Time    11/15/13  5:00 AM      Result Value Range   WBC 17.3 (*) 4.0 - 10.5 K/uL   RBC 3.42 (*)  3.87 - 5.11 MIL/uL   Hemoglobin 8.0 (*) 12.0 - 15.0 g/dL   HCT 24.1 (*) 36.0 - 46.0 %   MCV 70.5 (*) 78.0 - 100.0 fL   MCH 23.4 (*) 26.0 - 34.0 pg   MCHC 33.2  30.0 - 36.0 g/dL   RDW 15.8 (*) 11.5 - 15.5 %   Platelets 552 (*) 150 - 400 K/uL   Neutrophils Relative % 75  43 - 77 %   Neutro Abs 13.0 (*) 1.7 - 7.7 K/uL   Lymphocytes Relative 7 (*) 12 - 46 %   Lymphs Abs 1.1  0.7 - 4.0 K/uL   Monocytes Relative 15 (*) 3 - 12 %   Monocytes Absolute 2.7 (*) 0.1 - 1.0 K/uL   Eosinophils Relative 3  0 - 5 %   Eosinophils Absolute 0.5  0.0 - 0.7 K/uL   Basophils Relative 0  0 - 1 %   Basophils Absolute 0.0  0.0 - 0.1 K/uL  COMPREHENSIVE METABOLIC PANEL     Status: Abnormal   Collection Time    11/15/13  5:00 AM      Result Value Range   Sodium 131 (*) 137 - 147 mEq/L   Comment: Please note change in reference range.   Potassium 4.6  3.7 - 5.3 mEq/L   Comment: Please note change in reference range.   Chloride 92 (*) 96 - 112 mEq/L   CO2 24  19 - 32 mEq/L   Glucose, Bld 155 (*) 70 - 99 mg/dL   BUN 29 (*) 6 - 23 mg/dL   Creatinine, Ser 1.47 (*) 0.50 - 1.10 mg/dL   Calcium 9.0  8.4 - 10.5 mg/dL   Total Protein 6.7  6.0 - 8.3 g/dL   Albumin 2.7 (*) 3.5 - 5.2 g/dL   AST 15  0 - 37 U/L   ALT 13  0 - 35 U/L   Alkaline Phosphatase 83  39 - 117 U/L   Total Bilirubin <0.2 (*) 0.3 - 1.2 mg/dL   GFR calc non Af Amer 32 (*) >90 mL/min   GFR calc Af Amer 37 (*) >90 mL/min   Comment: (NOTE)     The eGFR has been calculated using the CKD EPI equation.     This calculation has not been validated in all clinical situations.     eGFR's persistently <90 mL/min signify possible Chronic Kidney     Disease.  TROPONIN I     Status: None   Collection Time    11/15/13  5:07 AM      Result Value Range   Troponin I <0.30  <0.30 ng/mL  Comment:            Due to the release kinetics of cTnI,     a negative result within the first hours     of the onset of symptoms does not rule out     myocardial  infarction with certainty.     If myocardial infarction is still suspected,     repeat the test at appropriate intervals.    Radiological Exams on Admission: Dg Chest Port 1 View  11/15/2013   CLINICAL DATA:  Chest pain  EXAM: PORTABLE CHEST - 1 VIEW  COMPARISON:  Prior radiograph from 06/15/2013  FINDINGS: The cardiac and mediastinal silhouettes are stable in size and contour, and remain within normal limits. Prominent atherosclerotic calcifications noted within the aortic arch.  The lungs are normally inflated. There is patchy left lower lobe airspace opacity, worrisome for possible pneumonia. No definite pleural effusion identified, although the left costophrenic angles poorly evaluated. Mild diffuse pulmonary vascular congestion is present without frank pulmonary edema. No pneumothorax. The right lung is clear.  No acute osseous abnormality identified.  IMPRESSION: Left lower lobe infiltrate, worrisome for pneumonia.   Electronically Signed   By: Jeannine Boga M.D.   On: 11/15/2013 06:14    Assessment/Plan Principal Problem:   Community acquired pneumonia Active Problems:   Fatigue   Hypothyroidism   Chronic kidney disease, stage III (moderate)   Pneumonia  ? Pneumonia- patient came with leukocytosis, portable chest x-ray shows left lower lobe infiltrate worrisome for pneumonia. Patient will be started on Levaquin, will repeat chest x-ray PA and lateral, also obtained urinary strep pneumo antigen and Legionella antigen. Patient's leukocytosis could be secondary to prednisone she was taking since December 30. Patient has been afebrile, will also obtain blood cultures x2.  ? Diastolic heart failure- patient's symptoms may have been secondary to pulmonary edema due to diastolic heart failure. Patient has been on Lasix 40 mg by mouth daily at the facility. I will obtain a BNP, 2-D echo to  rule out underlying exacerbation of diastolic heart failure.  Chronic kidney disease- patient has  CK D. stage III, her baseline creatinine is 1.3 and today her creatinine is 1.47. We'll continue to monitor the BMP in the hospital  Chest pain- patient has history of CAD and hypertension. EKG shows nonspecific ST-T changes. We'll obtain 3 more sets of cardiac enzymes and monitor the patient in telemetry  Leukocytosis- likely due to prednisone therapy versus pneumonia. Prednisone will be tapered over next 3 days. We'll give 5 mg prednisone daily for 3 days then stop as patient has been taking prednisone 10 mg by mouth daily since 12/30  Mouth sores- patient complaining of burning in the mouth, will give Magic mouthwash with lidocaine 3 times a day when necessary.  DVT prophylaxis- Lovenox  Code status: Patient is full code  Family discussion: Discussed with patient's   Time Spent on Admission: *65 min  [facility] Pager: [phone] 11/15/2013, 8:11 AM  If 7PM-7AM, please contact night-coverage  [email or web address]  Password [email or web address]

## 2013-11-15 NOTE — ED Provider Notes (Signed)
CSN: [id number]     Arrival date & time 11/15/13  0355 History   First MD Initiated Contact with Patient 11/15/13 0430     Chief Complaint  Patient presents with  . Chest Pain   (Consider location/radiation/quality/duration/timing/severity/associated sxs/prior Treatment) HPI Patient presents with 24 hours of left upper chest pain. The pain is worse with deep inspiration. There is no radiation. Patient has no shortness of breath or cough. Patient denies any fevers or chills. Patient's had no nausea or vomiting. Patient denies similar pain like this in the past. Brought in by EMS. Daughter states the patient was recently started on prednisone and her dose of Klonopin was increased. Past Medical History  Diagnosis Date  . Hypertension   . Coronary artery disease   . Heart murmur   . Hypothyroidism 05-02-12    tx. Synthroid  . Headache(784.0) 05-02-12    past hx. migrianes-many yrs ago  . Arthritis 05-02-12    osteoarthritis-rt. hip  . Pneumonia 05-02-12    mild case  . 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 Acute upper respiratory infections of unspecified site 2014  . Unspecified venous (peripheral) insufficiency 2013  . Senile osteoporosis 2013  . Unspecified closed fracture of pelvis 10/13/2012  . Cellulitis and abscess of leg, except foot 2013  . Altered mental status 2013  . Chronic pain syndrome 2013  . Urinary tract infection, site not specified   . Hyposmolality and/or hyponatremia 05/2012  . Congestive heart failure, unspecified 05/2012  . Pneumonia, organism unspecified 05/2012  . Pressure ulcer, buttock(707.05) 05/2012  . Hypoxemia 05/2012  . Reflux esophagitis 3013  . Unspecified constipation 2013  . Acute posthemorrhagic anemia 05/2012  . Coronary atherosclerosis of native coronary artery 05/2012  . Pain in joint, pelvic region and thigh 04/2012  . Dysphagia, unspecified(787.20) 03/2012  . Benign neoplasm of adrenal gland 10/2011  . Alzheimer's disease 06/2011  . Other seborrheic  keratosis 2012  . Hypertensive renal disease, benign 2012  . Pain in joint, lower leg 2012  . Memory loss 09/2010  . Spinal stenosis, lumbar region, without neurogenic claudication 2011  . Unspecified vitamin D deficiency 2010  . Unspecified disorder of kidney and ureter 2010  . Edema 2010  . Other malaise and fatigue 2009  . Congenital factor VIII disorder 2009  . Senile dementia with delusional features 2008  . Generalized hyperhidrosis 2007  . Irritable bowel syndrome 2007  . Unspecified chronic bronchitis 2006  . Depression 06/2005     depression only,tx. oral meds  . Shortness of breath 2005  . Anxiety 2004  . Unspecified late effects of cerebrovascular disease 2004  . Migraine with aura, without mention of intractable migraine without mention of status migrainosus 2003  . Tinnitus 2003  . Other symptoms involving cardiovascular system 2003  . Hyperlipidemia 2002   Past Surgical History  Procedure Laterality Date  . Tonsillectomy    . Tubal ligation  05-02-12  .  tummy tuck  05-02-12    many yrs ago  . Cataract extraction, bilateral  05-02-12    bilateral  . Retinal detachment surgery  05-02-12    left  . Total hip arthroplasty  05/09/2012    Procedure: TOTAL HIP ARTHROPLASTY ANTERIOR APPROACH;  Surgeon: Mcarthur Rossetti, MD;  Location: [facility];  Service: Orthopedics;  Laterality: Right;  . Dental surgery  08/11/13    3 teeth surgical removed by Dr. Lewanda Rife   Family History  Problem Relation Age of Onset  . Diabetes Father  History  Substance Use Topics  . Smoking status: Former Smoker -- 1.00 packs/day for 62 years    Types: Cigarettes    Quit date: 10/10/2008  . Smokeless tobacco: Never Used  . Alcohol Use: No   OB History   Grav Para Term Preterm Abortions TAB SAB Ect Mult Living                 Review of Systems  Constitutional: Negative for fever and chills.  Respiratory: Negative for cough and shortness of breath.   Cardiovascular: Positive for  chest pain. Negative for palpitations and leg swelling.  Gastrointestinal: Negative for nausea, vomiting, abdominal pain and diarrhea.  Musculoskeletal: Negative for back pain, neck pain and neck stiffness.  Skin: Negative for rash and wound.  Neurological: Negative for dizziness, weakness, light-headedness, numbness and headaches.  All other systems reviewed and are negative.    Allergies  Penicillins  Home Medications   Current Outpatient Rx  Name  Route  Sig  Dispense  Refill  . acetaminophen (TYLENOL) 325 MG tablet   Oral   Take 325 mg by mouth. One daily as needed         . Ascorbic Acid (VITAMIN C) 1000 MG tablet   Oral   Take 1,000 mg by mouth daily with breakfast.         . aspirin EC 81 MG tablet   Oral   Take 81 mg by mouth daily.         . brimonidine-timolol (COMBIGAN) 0.2-0.5 % ophthalmic solution   Both Eyes   Place 1 drop into both eyes every 12 (twelve) hours.           . calcitonin, salmon, (MIACALCIN/FORTICAL) 200 UNIT/ACT nasal spray      Use one spray in one nostril once daily. Alternate nostrils daily   3.7 mL   3   . calcium-vitamin D (OSCAL WITH D) 500-200 MG-UNIT per tablet      Take one tablet three times daily for calcium supplementation         . clonazePAM (KLONOPIN) 0.5 MG tablet   Oral   Take 0.25 mg by mouth 3 (three) times daily as needed. Take one three times a day         . furosemide (LASIX) 40 MG tablet      Take one tablet twice daily to prevent fluid retention.   60 tablet   3   . lamoTRIgine (LAMICTAL) 200 MG tablet   Oral   Take 200 mg by mouth daily.         . metoprolol (LOPRESSOR) 50 MG tablet      Take one tablet by mouth twice daily to control blood pressure   60 tablet   5   . multivitamin-lutein (OCUVITE-LUTEIN) CAPS   Oral   Take 1 capsule by mouth daily.         . naproxen sodium (ANAPROX) 220 MG tablet   Oral   Take 220 mg by mouth. Take one daily         . potassium chloride SA  (K-DUR,KLOR-CON) 20 MEQ tablet      TAKE 1 TABLET TWICE DAILY.   60 tablet   1   . PRESCRIPTION MEDICATION      3-4 capsules once. Pt took 3-4 clindamycin before surgery on 04/30/2013 once. Pharmacy did not have record of this-per patient and spouse         . senna (SENOKOT) 8.6 MG TABS  Oral   Take 2 tablets by mouth at bedtime.         . sertraline (ZOLOFT) 100 MG tablet   Oral   Take 50 mg by mouth daily.          Marland Kitchen SYNTHROID 88 MCG tablet      TAKE 1 TABLET ONCE DAILY FOR THYROID.   90 tablet   5   . traZODone (DESYREL) 50 MG tablet      50 mg. Take one tablet at bedtime          BP 133/65  Pulse 70  Temp(Src) 99.8 F (37.7 C) (Oral)  Resp 25  SpO2 96% Physical Exam  Nursing note and vitals reviewed. Constitutional: She is oriented to person, place, and time. She appears well-developed and well-nourished. No distress.  HENT:  Head: Normocephalic and atraumatic.  Mouth/Throat: Oropharynx is clear and moist.  Eyes: EOM are normal. Pupils are equal, round, and reactive to light.  Neck: Normal range of motion. Neck supple.  Cardiovascular: Normal rate and regular rhythm.   Pulmonary/Chest: Effort normal and breath sounds normal. No respiratory distress. She has no wheezes. She has no rales. She exhibits tenderness (mild chest tenderness to palpation right greater than left. No crepitance or deformity.).  Abdominal: Soft. Bowel sounds are normal. She exhibits no distension and no mass. There is no tenderness. There is no rebound and no guarding.  Musculoskeletal: Normal range of motion. She exhibits no edema and no tenderness.  No lower extremity swelling or pain.  Neurological: She is alert and oriented to person, place, and time.  Patient is confused but alert. She follows commands. Moves all extremities without deficit. Sensation grossly intact.  Skin: Skin is warm and dry. No rash noted. No erythema.  Psychiatric: She has a normal mood and affect. Her  behavior is normal.    ED Course  Procedures (including critical care time) Labs Review Labs Reviewed  CBC WITH DIFFERENTIAL  COMPREHENSIVE METABOLIC PANEL  TROPONIN I  URINALYSIS, ROUTINE W REFLEX MICROSCOPIC   Imaging Review No results found.  EKG Interpretation    Date/Time:  Sunday November 15 2013 05:13:16 EST Ventricular Rate:  71 PR Interval:  175 QRS Duration: 90 QT Interval:  386 QTC Calculation: 419 R Axis:   24 Text Interpretation:  Sinus rhythm Borderline ST elevation, lateral leads Confirmed by Lita Mains  MD, Meoshia Billing ([id number]) on 11/15/2013 7:04:34 AM            MDM   Since hospitalizations. She lives in assisted living facility. We'll treat his community-acquired pneumonia admit to hospitalist service.  Discussed with Dr. Darrick Meigs. Will admit to telemetry bed.  Julianne Rice, MD 11/15/13 [phone]

## 2013-11-15 NOTE — ED Notes (Addendum)
Patient with chest pain that started some time during the night.  Patient does have shortness of breath with exertion.  Pain is5/10.  No nausea or vomiting.  Patient was given 324mg  of ASA.

## 2013-11-15 NOTE — Progress Notes (Signed)
ANTIBIOTIC CONSULT NOTE - INITIAL  Pharmacy Consult for aztreonam Indication: Community acquired pneumonia  Allergies  Allergen Reactions  . Levaquin [Levofloxacin In D5w] Hives  . Penicillins Rash    Patient Measurements: Height: 5\' 4"  (162.6 cm) Weight: 151 lb 14.4 oz (68.9 kg) IBW/kg (Calculated) : 54.7 Adjusted Body Weight:   Vital Signs: Temp: 98.8 F (37.1 C) (01/04 1150) Temp src: Oral (01/04 1150) BP: 105/41 mmHg (01/04 1150) Pulse Rate: 76 (01/04 1150) Intake/Output from previous day:   Intake/Output from this shift: Total I/O In: 360 [P.O.:360] Out: -   Labs:  Recent Labs  11/15/13 0500  WBC 17.3*  HGB 8.0*  PLT 552*  CREATININE 1.47*   Estimated Creatinine Clearance: 27.2 ml/min (by C-G formula based on Cr of 1.47). No results found for this basename: VANCOTROUGH, VANCOPEAK, VANCORANDOM, GENTTROUGH, GENTPEAK, GENTRANDOM, TOBRATROUGH, TOBRAPEAK, TOBRARND, AMIKACINPEAK, AMIKACINTROU, AMIKACIN,  in the last 72 hours   Microbiology: No results found for this or any previous visit (from the past 720 hour(s)).  Medical History: Past Medical History  Diagnosis Date  . Hypertension   . Coronary artery disease   . Heart murmur   . Hypothyroidism 05-02-12    tx. Synthroid  . Headache(784.0) 05-02-12    past hx. migrianes-many yrs ago  . Arthritis 05-02-12    osteoarthritis-rt. hip  . Pneumonia 05-02-12    mild case  . 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 Acute upper respiratory infections of unspecified site 2014  . Unspecified venous (peripheral) insufficiency 2013  . Senile osteoporosis 2013  . Unspecified closed fracture of pelvis 10/13/2012  . Cellulitis and abscess of leg, except foot 2013  . Altered mental status 2013  . Chronic pain syndrome 2013  . Urinary tract infection, site not specified   . Hyposmolality and/or hyponatremia 05/2012  . Congestive heart failure, unspecified 05/2012  . Pneumonia, organism unspecified 05/2012  . Pressure ulcer,  buttock(707.05) 05/2012  . Hypoxemia 05/2012  . Reflux esophagitis 3013  . Unspecified constipation 2013  . Acute posthemorrhagic anemia 05/2012  . Coronary atherosclerosis of native coronary artery 05/2012  . Pain in joint, pelvic region and thigh 04/2012  . Dysphagia, unspecified(787.20) 03/2012  . Benign neoplasm of adrenal gland 10/2011  . Alzheimer's disease 06/2011  . Other seborrheic keratosis 2012  . Hypertensive renal disease, benign 2012  . Pain in joint, lower leg 2012  . Memory loss 09/2010  . Spinal stenosis, lumbar region, without neurogenic claudication 2011  . Unspecified vitamin D deficiency 2010  . Unspecified disorder of kidney and ureter 2010  . Edema 2010  . Other malaise and fatigue 2009  . Congenital factor VIII disorder 2009  . Senile dementia with delusional features 2008  . Generalized hyperhidrosis 2007  . Irritable bowel syndrome 2007  . Unspecified chronic bronchitis 2006  . Depression 06/2005     depression only,tx. oral meds  . Shortness of breath 2005  . Anxiety 2004  . Unspecified late effects of cerebrovascular disease 2004  . Migraine with aura, without mention of intractable migraine without mention of status migrainosus 2003  . Tinnitus 2003  . Other symptoms involving cardiovascular system 2003  . Hyperlipidemia 2002   Assessment: 80 year old female presents from ALF with general fatigue over the past 2 weeks. No fevers noted, wbc elevated at 17. Creatinine close to baseline at 1.47. Noted patient appears to have had allergic rxn to levaquin in the ED - allergy list updated. New orders received for aztreonam.  Goal of  Therapy:  Resolution of infection  Plan:  Follow up culture results Aztreonam 1 g q8 hours  Georgina Peer 11/15/2013,12:14 PM

## 2013-11-15 NOTE — ED Notes (Signed)
Contacted radiology about the delay in the pt's chest x-ray result.

## 2013-11-15 NOTE — ED Notes (Signed)
Admitting MD at bedside.

## 2013-11-16 DIAGNOSIS — E871 Hypo-osmolality and hyponatremia: Secondary | ICD-10-CM | POA: Diagnosis present

## 2013-11-16 DIAGNOSIS — I5031 Acute diastolic (congestive) heart failure: Secondary | ICD-10-CM

## 2013-11-16 DIAGNOSIS — I369 Nonrheumatic tricuspid valve disorder, unspecified: Secondary | ICD-10-CM

## 2013-11-16 DIAGNOSIS — D649 Anemia, unspecified: Secondary | ICD-10-CM | POA: Diagnosis present

## 2013-11-16 DIAGNOSIS — I509 Heart failure, unspecified: Secondary | ICD-10-CM

## 2013-11-16 LAB — COMPREHENSIVE METABOLIC PANEL
ALT: 13 U/L (ref 0–35)
AST: 18 U/L (ref 0–37)
Albumin: 2.2 g/dL — ABNORMAL LOW (ref 3.5–5.2)
Alkaline Phosphatase: 74 U/L (ref 39–117)
BUN: 28 mg/dL — ABNORMAL HIGH (ref 6–23)
CO2: 22 meq/L (ref 19–32)
CREATININE: 1.12 mg/dL — AB (ref 0.50–1.10)
Calcium: 8 mg/dL — ABNORMAL LOW (ref 8.4–10.5)
Chloride: 91 mEq/L — ABNORMAL LOW (ref 96–112)
GFR calc Af Amer: 51 mL/min — ABNORMAL LOW (ref >90)
GFR, EST NON AFRICAN AMERICAN: 44 mL/min — AB (ref >90)
Glucose, Bld: 167 mg/dL — ABNORMAL HIGH (ref 70–99)
Potassium: 4.7 mEq/L (ref 3.7–5.3)
Sodium: 127 mEq/L — ABNORMAL LOW (ref 137–147)
Total Protein: 6 g/dL (ref 6.0–8.3)

## 2013-11-16 LAB — LEGIONELLA ANTIGEN, URINE: Legionella Antigen, Urine: NEGATIVE

## 2013-11-16 LAB — URINALYSIS, ROUTINE W REFLEX MICROSCOPIC
Bilirubin Urine: NEGATIVE
Glucose, UA: NEGATIVE mg/dL
Hgb urine dipstick: NEGATIVE
KETONES UR: NEGATIVE mg/dL
LEUKOCYTES UA: NEGATIVE
NITRITE: NEGATIVE
PH: 5 (ref 5.0–8.0)
PROTEIN: NEGATIVE mg/dL
Specific Gravity, Urine: 1.009 (ref 1.005–1.030)
Urobilinogen, UA: 0.2 mg/dL (ref 0.0–1.0)

## 2013-11-16 LAB — FOLATE: FOLATE: 13.9 ng/mL

## 2013-11-16 LAB — CBC
HEMATOCRIT: 19.9 % — AB (ref 36.0–46.0)
Hemoglobin: 6.6 g/dL — CL (ref 12.0–15.0)
MCH: 23.3 pg — ABNORMAL LOW (ref 26.0–34.0)
MCHC: 33.2 g/dL (ref 30.0–36.0)
MCV: 70.3 fL — AB (ref 78.0–100.0)
Platelets: 486 10*3/uL — ABNORMAL HIGH (ref 150–400)
RBC: 2.83 MIL/uL — ABNORMAL LOW (ref 3.87–5.11)
RDW: 15.7 % — ABNORMAL HIGH (ref 11.5–15.5)
WBC: 17.1 10*3/uL — ABNORMAL HIGH (ref 4.0–10.5)

## 2013-11-16 LAB — IRON AND TIBC
Iron: 16 ug/dL — ABNORMAL LOW (ref 42–135)
SATURATION RATIOS: 5 % — AB (ref 20–55)
TIBC: 336 ug/dL (ref 250–470)
UIBC: 320 ug/dL (ref 125–400)

## 2013-11-16 LAB — ABO/RH: ABO/RH(D): O POS

## 2013-11-16 LAB — OCCULT BLOOD X 1 CARD TO LAB, STOOL: Fecal Occult Bld: POSITIVE — AB

## 2013-11-16 LAB — PREPARE RBC (CROSSMATCH)

## 2013-11-16 LAB — FERRITIN: Ferritin: 60 ng/mL (ref 10–291)

## 2013-11-16 LAB — STREP PNEUMONIAE URINARY ANTIGEN: Strep Pneumo Urinary Antigen: NEGATIVE

## 2013-11-16 LAB — VITAMIN B12: Vitamin B-12: 528 pg/mL (ref 211–911)

## 2013-11-16 LAB — TROPONIN I: Troponin I: <0.3 ng/mL (ref <0.30)

## 2013-11-16 MED ORDER — ACETAMINOPHEN 325 MG PO TABS
650.0000 mg | ORAL_TABLET | Freq: Once | ORAL | Status: AC
  Administered 2013-11-16: 650 mg via ORAL
  Filled 2013-11-16: qty 2

## 2013-11-16 MED ORDER — SENNOSIDES-DOCUSATE SODIUM 8.6-50 MG PO TABS
1.0000 | ORAL_TABLET | Freq: Every day | ORAL | Status: DC
  Administered 2013-11-17 – 2013-11-19 (×4): 1 via ORAL
  Filled 2013-11-16 (×5): qty 1

## 2013-11-16 MED ORDER — BISACODYL 10 MG RE SUPP
10.0000 mg | Freq: Every day | RECTAL | Status: DC | PRN
  Administered 2013-11-16: 10 mg via RECTAL
  Filled 2013-11-16: qty 1

## 2013-11-16 MED ORDER — POLYETHYLENE GLYCOL 3350 17 G PO PACK
17.0000 g | PACK | Freq: Every day | ORAL | Status: DC
  Administered 2013-11-16 – 2013-11-20 (×5): 17 g via ORAL
  Filled 2013-11-16 (×5): qty 1

## 2013-11-16 MED ORDER — FUROSEMIDE 10 MG/ML IJ SOLN
60.0000 mg | Freq: Two times a day (BID) | INTRAMUSCULAR | Status: DC
  Administered 2013-11-16 – 2013-11-17 (×2): 60 mg via INTRAVENOUS
  Filled 2013-11-16 (×4): qty 6

## 2013-11-16 MED ORDER — FUROSEMIDE 10 MG/ML IJ SOLN
40.0000 mg | Freq: Once | INTRAMUSCULAR | Status: AC
  Administered 2013-11-16: 40 mg via INTRAVENOUS
  Filled 2013-11-16: qty 4

## 2013-11-16 MED ORDER — DIPHENHYDRAMINE HCL 25 MG PO CAPS
25.0000 mg | ORAL_CAPSULE | Freq: Once | ORAL | Status: AC
  Administered 2013-11-16: 25 mg via ORAL
  Filled 2013-11-16: qty 1

## 2013-11-16 NOTE — Progress Notes (Signed)
2nd unit PRBC started. Patient tolerating well. VSS. Will continue to monitor.Cindee Salt

## 2013-11-16 NOTE — Progress Notes (Signed)
Page Raliegh Ip Schorr N.P. With critical Hbg 6.6 and Hct. 19.9

## 2013-11-16 NOTE — Progress Notes (Signed)
[facility] PROGRESS NOTE  Jenny [name] [id number] DOB: 06-Sep-1927 DOA: 11/15/2013 PCP: Estill Dooms, MD  Assessment/Plan: #1community acquired pneumonia Chest x-ray consistent with a left lower lobe pneumonia. Patient noted to have a leukocytosis on admission. Urine strep pneumococcus antigen pending. Urine Legionella antigen pending. Patient was initially given IV Levaquin however developed a rash and this was discontinued. Patient on IV azithromycin and IV Azactam. Blood cultures pending. Follow.  #2 fatigue Questionable etiology. Likely secondary to symptomatic anemia versus acute CHF exacerbation. Cardiac enzymes were cycled which were negative x3.pro BNP which was obtained was elevated at 4643. 2-D echo at a normal ejection fraction with no wall motion abnormalities.patient noted to have a hemoglobin of 6.6. Anemia panel is pending. Check a TSH. Patient will be typed and crossed and transfused 2 units packed red blood cells.PT/OT. Follow.  #3 probable acute diastolic CHF exacerbation Some clinical improvement. Cardiac enzymes negative x3. Pro BNP was elevated. 2-D echo with normal EF and no wall motion abnormalities. We'll place on Lasix 60 mg IV every 12.strict I/Os. Daily weights. Continue aspirin, beta blocker.  #4 symptomatic anemia Patient with no overt GI bleed. Will check FOBT. Check an anemia panel. Type and cross and transfuse 2 units packed red blood cells. Follow H&H.  #5 chronic kidney disease stage III Stable. Follow.  #6 chest pain Likely secondary to problem #1. Patient currently chest pain-free. Cardiac enzymes negative x3. EKG with nonspecific ST-T wave changes. 2-D echo with normal EF with no wall motion abnormalities. Continue treatment as in #1 and #3. Follow.  #7 leukocytosis Likely secondary to problem #1 versus steroids. Patient had been on prednisone prior to admission. Patient on a prednisone taper. Patient on empiric IV azithromycin and IV  Azactam. Follow.  #8 hyponatremia Likely secondary to hypervolemic hyponatremia secondary to acute diastolic CHF. Patient on IV Lasix. Follow.  #9 depression Stable.continue Zoloft.  #10 dementia Stable.  #12 hypothyroidism Check a TSH. Continue Synthroid.  #13 prophylaxis Lovenox for DVT prophylaxis.  Code Status: Full Family Communication: updated patient and daughter via telephone. Disposition Plan: home when medically stable.   Consultants:  none  Procedures:  2 units packed red blood cells 11/16/2013  2-D echo 11/16/2013  Chest x-ray 11/15/2013,  Antibiotics:  IV Levaquin 11/15/2013---> 11/15/2013  IV azithromycin 11/15/2013  IV Azactam 11/15/2013  HPI/Subjective: Patient states feeling better. Patient had a bowel movement today. Per nursing patient had to strain. Patient denies any overt GI bleed.  Objective: Filed Vitals:   11/16/13 1354  BP: 150/84  Pulse: 67  Temp: 97.6 F (36.4 C)  Resp: 18    Intake/Output Summary (Last 24 hours) at 11/16/13 1400 Last data filed at 11/16/13 1333  Gross per 24 hour  Intake 1447.5 ml  Output    500 ml  Net  947.5 ml   Filed Weights   11/15/13 1146 11/16/13 0607  Weight: 68.9 kg (151 lb 14.4 oz) 71.578 kg (157 lb 12.8 oz)    Exam:   General:  NAD  Cardiovascular: RRR  Respiratory: Minimal bibasilar crackles  Abdomen: Soft/NT/ND/+BS  Musculoskeletal: No c/c/e   Data Reviewed: Basic Metabolic Panel:  Recent Labs Lab 11/15/13 0500 11/15/13 1325 11/16/13 0502  NA 131*  --  127*  K 4.6  --  4.7  CL 92*  --  91*  CO2 24  --  22  GLUCOSE 155*  --  167*  BUN 29*  --  28*  CREATININE 1.47* 1.29* 1.12*  CALCIUM 9.0  --  8.0*   Liver Function Tests:  Recent Labs Lab 11/15/13 0500 11/16/13 0502  AST 15 18  ALT 13 13  ALKPHOS 83 74  BILITOT <0.2* <0.2*  PROT 6.7 6.0  ALBUMIN 2.7* 2.2*   No results found for this basename: LIPASE, AMYLASE,  in the last 168 hours No results found  for this basename: AMMONIA,  in the last 168 hours CBC:  Recent Labs Lab 11/15/13 0500 11/15/13 1325 11/16/13 0502  WBC 17.3* 13.8* 17.1*  NEUTROABS 13.0*  --   --   HGB 8.0* 7.4* 6.6*  HCT 24.1* 22.9* 19.9*  MCV 70.5* 72.0* 70.3*  PLT 552* 493* 486*   Cardiac Enzymes:  Recent Labs Lab 11/15/13 0507 11/15/13 1325 11/15/13 1815 11/16/13 0045  TROPONINI <0.30 <0.30 <0.30 <0.30   BNP (last 3 results)  Recent Labs  06/15/13 1531 11/15/13 1325  PROBNP 519.0* 4643.0*   CBG: No results found for this basename: GLUCAP,  in the last 168 hours  Recent Results (from the past 240 hour(s))  CULTURE, BLOOD (ROUTINE X 2)     Status: None   Collection Time    11/15/13  6:45 AM      Result Value Range Status   Specimen Description BLOOD RIGHT ARM   Final   Special Requests BOTTLES DRAWN AEROBIC AND ANAEROBIC 5CC   Final   Culture  Setup Time     Final   Value: 11/15/2013 16:12     Performed at [facility]   Culture     Final   Value:        BLOOD CULTURE RECEIVED NO GROWTH TO DATE CULTURE WILL BE HELD FOR 5 DAYS BEFORE ISSUING A FINAL NEGATIVE REPORT     Performed at [facility]   Report Status PENDING   Incomplete  CULTURE, BLOOD (ROUTINE X 2)     Status: None   Collection Time    11/15/13  6:45 AM      Result Value Range Status   Specimen Description BLOOD LEFT ARM   Final   Special Requests BOTTLES DRAWN AEROBIC AND ANAEROBIC 10CC   Final   Culture  Setup Time     Final   Value: 11/15/2013 16:12     Performed at [facility]   Culture     Final   Value:        BLOOD CULTURE RECEIVED NO GROWTH TO DATE CULTURE WILL BE HELD FOR 5 DAYS BEFORE ISSUING A FINAL NEGATIVE REPORT     Performed at [facility]   Report Status PENDING   Incomplete     Studies: Dg Chest 1 View  11/15/2013   CLINICAL DATA:  Cough.  Evaluate for pneumonia versus edema.  EXAM: CHEST - 1 VIEW  COMPARISON:  Portable examination same date. Two-view study 06/15/2013.   FINDINGS: This examination consists of a lateral view only. The left basilar opacity appears parenchymal. No significant pleural effusion is identified. Assessment for edema on a lateral view is limited. Atherosclerosis and spondylosis are noted.  IMPRESSION: Persistent left lower lobe airspace disease suspicious for pneumonia. No significant pleural effusion.   Electronically Signed   By: Camie Patience M.D.   On: 11/15/2013 09:34   Dg Chest Port 1 View  11/15/2013   CLINICAL DATA:  Chest pain  EXAM: PORTABLE CHEST - 1 VIEW  COMPARISON:  Prior radiograph from 06/15/2013  FINDINGS: The cardiac and mediastinal silhouettes are stable in size and contour, and remain within  normal limits. Prominent atherosclerotic calcifications noted within the aortic arch.  The lungs are normally inflated. There is patchy left lower lobe airspace opacity, worrisome for possible pneumonia. No definite pleural effusion identified, although the left costophrenic angles poorly evaluated. Mild diffuse pulmonary vascular congestion is present without frank pulmonary edema. No pneumothorax. The right lung is clear.  No acute osseous abnormality identified.  IMPRESSION: Left lower lobe infiltrate, worrisome for pneumonia.   Electronically Signed   By: Jeannine Boga M.D.   On: 11/15/2013 06:14    Scheduled Meds: . aspirin EC  81 mg Oral Daily  . azithromycin  500 mg Intravenous Q24H  . aztreonam  1 g Intravenous Q8H  . brimonidine  1 drop Both Eyes Q12H   And  . timolol  1 drop Both Eyes BID  . calcitonin (salmon)  1 spray Alternating Nares Daily  . enoxaparin (LOVENOX) injection  40 mg Subcutaneous Q24H  . furosemide  60 mg Intravenous BID  . lamoTRIgine  200 mg Oral Daily  . levothyroxine  88 mcg Oral QAC breakfast  . nebivolol  10 mg Oral Daily  . predniSONE  5 mg Oral Q breakfast  . sertraline  50 mg Oral Daily  . sodium chloride  3 mL Intravenous Q12H  . traZODone  50 mg Oral QHS   Continuous Infusions:    Principal Problem:   Community acquired pneumonia Active Problems:   Fatigue   Hypothyroidism   Depressive disorder   Dementia in conditions classified elsewhere without behavioral disturbance(294.10)   Chronic kidney disease, stage III (moderate)   Pneumonia   Anemia   Hyponatremia    Time spent: 39 mins    [employer] MD [facility] Pager [phone]. If 7PM-7AM, please contact night-coverage at [email or web address], password [facility] 11/16/2013, 2:00 PM  LOS: 1 day

## 2013-11-16 NOTE — Progress Notes (Signed)
Echocardiogram 2D Echocardiogram has been performed.  Jenny [name] 11/16/2013, 10:03 AM

## 2013-11-16 NOTE — Progress Notes (Signed)
1st unit Blood transfusion complete. VSS. Call bell near. Will monitor. Awaiting lasix.Cindee Salt

## 2013-11-16 NOTE — Progress Notes (Signed)
Utilization Review Completed.Jenny [name] T1/03/2014  

## 2013-11-16 NOTE — Progress Notes (Signed)
Patient having difficulty passing stool. Dulcolax was given. Formed stool noted to rectum. Patient straining to go. Instructed patient not to strain. Patient stated "I usually give myself several suppositories at home until I go." Patient trying to self impact; instructed otherwise. Patient remains in bathroom. Will continue to monitor.Cindee Salt

## 2013-11-16 NOTE — Progress Notes (Signed)
Patient passed very large formed stool. Patient cleaned and assisted back to bed. Requesting laxative for PM. Will inform MD. Call bell and daughter near.Cindee Salt

## 2013-11-16 NOTE — Progress Notes (Signed)
Clinical Social Work Department BRIEF PSYCHOSOCIAL ASSESSMENT 11/16/2013  Patient:  MESHAWN, OCONNOR     Account Number:  [id number]     Admit date:  11/15/2013  Clinical Social Worker:  Megan Salon  Date/Time:  11/16/2013 12:00 N  Referred by:  RN  Date Referred:  11/16/2013 Referred for  ALF Placement   Other Referral:   Interview type:  Other - See comment Other interview type:   CSW spoke to patient and patient's daughter by bedside    PSYCHOSOCIAL DATA Living Status:  FACILITY Admitted from facility:  [facility] Level of care:  Assisted Living Primary support name:  [facility] Primary support relationship to patient:  CHILD, ADULT Degree of support available:   Good    CURRENT CONCERNS Current Concerns  Post-Acute Placement   Other Concerns:    SOCIAL WORK ASSESSMENT / PLAN CSW received referral that patient is from [facility]. CSW went into room and introduced self to patient and patient's daughter by bedside. CSW explained that we are waiting on PT evaluation to determine if patient needs a higher level of care. Patient and daughter understood. CSW will keep [facility] updated and will assist at dc. If patient needs SNF, patient can go to SNF at [facility].   Assessment/plan status:  Psychosocial Support/Ongoing Assessment of Needs Other assessment/ plan:   Information/referral to community resources:   SNF information    PATIENT'S/FAMILY'S RESPONSE TO PLAN OF CARE: Patient is agreeable to the plan.       Jeanette Caprice, MSW, [place]

## 2013-11-16 NOTE — Evaluation (Signed)
Physical Therapy Evaluation Patient Details Name: Jenny [name] MRN: [id number] DOB: [birth year] Today's Date: 11/16/2013 Time: 7829-5621 PT Time Calculation (min): 34 min  PT Assessment / Plan / Recommendation History of Present Illness  80 year old female with past medical history of hypertension, hypothyroidism, CAD, CHF who resides at assisted living facility was brought to the ED after patient has been experiencing generalized fatigue over the past 2 weeks. Patient also complains of chest pain which has been intermittent and not associated with shortness of breath. She also denies nausea vomiting or diarrhea. She denies fever no dysuria. Patient was seen by interpretation at the assisted living facility and was started on prednisone 10 mg by mouth daily on 12/3 for possible allergic reaction, as patient complained of burning in her tongue.  Clinical Impression  Pt presents today with decreased balance and activity tolerance due to CAP.  Requires min/guard to min assist for most mobility at this time, therefore recommend continued acute services to address deficits.  PT recommends either ALF back to [facility] if they can provide min/guard to min assist, however if not, graddaughter states they have a SNF that she could go to short term then back to ALF.      PT Assessment  Patient needs continued PT services    Follow Up Recommendations  Other (comment) (ALF if can provide min assist to min/guard, otherwise will need ST SNF)    Does the patient have the potential to tolerate intense rehabilitation      Barriers to Discharge   Unsure if [facility] able to provide min/guard to min assist at times    Equipment Recommendations  Rolling walker with 5" wheels    Recommendations for Other Services     Frequency Min 3X/week    Precautions / Restrictions Precautions Precautions: Fall Restrictions Weight Bearing Restrictions: No   Pertinent Vitals/Pain No pain      Mobility  Bed  Mobility Bed Mobility: Supine to Sit Supine to Sit: 5: Supervision;HOB elevated;With rails Details for Bed Mobility Assistance: Performed bed mobility with supervision for safety with use of handrails to assist.  Transfers Transfers: Sit to Stand;Stand to Sit Sit to Stand: 4: Min guard;From bed;From toilet Stand to Sit: 4: Min guard;To chair/3-in-1;To toilet Details for Transfer Assistance: Performed transfers several times in order to use restroom.  Performed all at min/guard for safety with min facilitation for increased forward weight shift.   Ambulation/Gait Ambulation/Gait Assistance: 4: Min assist Ambulation Distance (Feet): 55 Feet Assistive device: Rolling walker Ambulation/Gait Assistance Details: Performed gait in hallway with use of RW with cues for upright posture and technique when turning with RW.  Ambulated on RA with SaO2 at 94% pregait, 91% during gait and 91% following gait.  RN notified.  Gait Pattern: Step-through pattern;Decreased stride length;Trunk flexed    Exercises     PT Diagnosis: Difficulty walking;Generalized weakness  PT Problem List: Decreased strength;Decreased activity tolerance;Decreased balance;Decreased mobility;Decreased knowledge of use of DME;Decreased safety awareness;Decreased knowledge of precautions;Cardiopulmonary status limiting activity PT Treatment Interventions: DME instruction;Gait training;Functional mobility training;Therapeutic activities;Therapeutic exercise;Balance training;Patient/family education     PT Goals(Current goals can be found in the care plan section) Acute Rehab PT Goals Patient Stated Goal: to return to [facility] PT Goal Formulation: With patient/family Time For Goal Achievement: 11/23/13 Potential to Achieve Goals: Good  Visit Information  Last PT Received On: 11/16/13 Assistance Needed: +1 History of Present Illness: 80 year old female with past medical history of hypertension, hypothyroidism, CAD, CHF who  resides  at assisted living facility was brought to the ED after patient has been experiencing generalized fatigue over the past 2 weeks. Patient also complains of chest pain which has been intermittent and not associated with shortness of breath. She also denies nausea vomiting or diarrhea. She denies fever no dysuria. Patient was seen by interpretation at the assisted living facility and was started on prednisone 10 mg by mouth daily on 12/3 for possible allergic reaction, as patient complained of burning in her tongue.       Prior Functioning  Home Living Family/patient expects to be discharged to:: Assisted living Home Equipment: Walker - 4 wheels Prior Function Level of Independence: Needs assistance ADL's / Homemaking Assistance Needed: Pt states she was receiving assistance with bathing, however she states that she was also assisting.      Cognition  Cognition Arousal/Alertness: Awake/alert Behavior During Therapy: WFL for tasks assessed/performed Overall Cognitive Status: Within Functional Limits for tasks assessed    Extremity/Trunk Assessment Lower Extremity Assessment Lower Extremity Assessment: Generalized weakness Cervical / Trunk Assessment Cervical / Trunk Assessment: Kyphotic   Balance    End of Session PT - End of Session Equipment Utilized During Treatment: Gait belt Activity Tolerance: Patient tolerated treatment well Patient left: in chair;with call bell/phone within reach;with family/visitor present Nurse Communication: Mobility status  GP     Denice Bors 11/16/2013, 4:38 PM

## 2013-11-16 NOTE — Progress Notes (Signed)
Page Dr. Grandville Silos for critical hbg and hct results.

## 2013-11-17 DIAGNOSIS — I480 Paroxysmal atrial fibrillation: Secondary | ICD-10-CM | POA: Diagnosis not present

## 2013-11-17 DIAGNOSIS — I4891 Unspecified atrial fibrillation: Secondary | ICD-10-CM

## 2013-11-17 HISTORY — DX: Paroxysmal atrial fibrillation: I48.0

## 2013-11-17 LAB — BASIC METABOLIC PANEL
BUN: 26 mg/dL — ABNORMAL HIGH (ref 6–23)
BUN: 25 mg/dL — ABNORMAL HIGH (ref 6–23)
CALCIUM: 8.4 mg/dL (ref 8.4–10.5)
CO2: 22 meq/L (ref 19–32)
CO2: 22 meq/L (ref 19–32)
Calcium: 8.3 mg/dL — ABNORMAL LOW (ref 8.4–10.5)
Chloride: 87 mEq/L — ABNORMAL LOW (ref 96–112)
Chloride: 85 mEq/L — ABNORMAL LOW (ref 96–112)
Creatinine, Ser: 1.08 mg/dL (ref 0.50–1.10)
Creatinine, Ser: 1.17 mg/dL — ABNORMAL HIGH (ref 0.50–1.10)
GFR calc Af Amer: 53 mL/min — ABNORMAL LOW (ref >90)
GFR calc Af Amer: 48 mL/min — ABNORMAL LOW (ref >90)
GFR calc non Af Amer: 46 mL/min — ABNORMAL LOW (ref >90)
GFR calc non Af Amer: 42 mL/min — ABNORMAL LOW (ref >90)
Glucose, Bld: 131 mg/dL — ABNORMAL HIGH (ref 70–99)
Glucose, Bld: 190 mg/dL — ABNORMAL HIGH (ref 70–99)
POTASSIUM: 4 meq/L (ref 3.7–5.3)
Potassium: 4.1 mEq/L (ref 3.7–5.3)
SODIUM: 125 meq/L — AB (ref 137–147)
SODIUM: 121 meq/L — AB (ref 137–147)

## 2013-11-17 LAB — CBC
HCT: 29.1 % — ABNORMAL LOW (ref 36.0–46.0)
HCT: 28.8 % — ABNORMAL LOW (ref 36.0–46.0)
HEMATOCRIT: 28.8 % — AB (ref 36.0–46.0)
HEMOGLOBIN: 9.6 g/dL — AB (ref 12.0–15.0)
HEMOGLOBIN: 9.9 g/dL — AB (ref 12.0–15.0)
Hemoglobin: 9.6 g/dL — ABNORMAL LOW (ref 12.0–15.0)
MCH: 25.4 pg — ABNORMAL LOW (ref 26.0–34.0)
MCH: 24.9 pg — ABNORMAL LOW (ref 26.0–34.0)
MCH: 24.9 pg — ABNORMAL LOW (ref 26.0–34.0)
MCHC: 34 g/dL (ref 30.0–36.0)
MCHC: 33.3 g/dL (ref 30.0–36.0)
MCHC: 33.3 g/dL (ref 30.0–36.0)
MCV: 74.8 fL — AB (ref 78.0–100.0)
MCV: 74.6 fL — AB (ref 78.0–100.0)
MCV: 74.6 fL — AB (ref 78.0–100.0)
Platelets: 512 10*3/uL — ABNORMAL HIGH (ref 150–400)
Platelets: 460 10*3/uL — ABNORMAL HIGH (ref 150–400)
Platelets: 432 10*3/uL — ABNORMAL HIGH (ref 150–400)
RBC: 3.9 MIL/uL (ref 3.87–5.11)
RBC: 3.85 MIL/uL — ABNORMAL LOW (ref 3.87–5.11)
RBC: 3.86 MIL/uL — AB (ref 3.87–5.11)
RDW: 17.8 % — ABNORMAL HIGH (ref 11.5–15.5)
RDW: 17.4 % — ABNORMAL HIGH (ref 11.5–15.5)
RDW: 17.4 % — ABNORMAL HIGH (ref 11.5–15.5)
WBC: 14.9 10*3/uL — ABNORMAL HIGH (ref 4.0–10.5)
WBC: 13.7 10*3/uL — AB (ref 4.0–10.5)
WBC: 13.1 10*3/uL — AB (ref 4.0–10.5)

## 2013-11-17 LAB — TYPE AND SCREEN
ABO/RH(D): O POS
Antibody Screen: NEGATIVE
Unit division: 0
Unit division: 0
Unit division: 0

## 2013-11-17 LAB — OCCULT BLOOD X 1 CARD TO LAB, STOOL: FECAL OCCULT BLD: POSITIVE — AB

## 2013-11-17 LAB — OSMOLALITY: Osmolality: 263 mOsm/kg — ABNORMAL LOW (ref 275–300)

## 2013-11-17 LAB — TSH: TSH: 1.123 u[IU]/mL (ref 0.350–4.500)

## 2013-11-17 LAB — OSMOLALITY, URINE: Osmolality, Ur: 199 mOsm/kg — ABNORMAL LOW (ref 390–1090)

## 2013-11-17 LAB — PRO B NATRIURETIC PEPTIDE: PRO B NATRI PEPTIDE: 10172 pg/mL — AB (ref 0–450)

## 2013-11-17 LAB — SODIUM, URINE, RANDOM: SODIUM UR: 41 meq/L

## 2013-11-17 LAB — CREATININE, URINE, RANDOM: CREATININE, URINE: 23.53 mg/dL

## 2013-11-17 LAB — TROPONIN I: Troponin I: <0.3 ng/mL (ref <0.30)

## 2013-11-17 MED ORDER — FUROSEMIDE 40 MG PO TABS
40.0000 mg | ORAL_TABLET | Freq: Two times a day (BID) | ORAL | Status: DC
  Administered 2013-11-17 – 2013-11-20 (×6): 40 mg via ORAL
  Filled 2013-11-17 (×9): qty 1

## 2013-11-17 NOTE — Progress Notes (Signed)
CSW has left voicemail for [facility] to discuss possible SNF for short term. CSW awaiting phone call.   Jeanette Caprice, MSW, [place]

## 2013-11-17 NOTE — Evaluation (Signed)
Occupational Therapy Evaluation Patient Details Name: Jenny [name] MRN: [id number] DOB: 29-Dec-1926 Today's Date: 11/17/2013 Time: 3710-6269 OT Time Calculation (min): 23 min  OT Assessment / Plan / Recommendation History of present illness 80 year old female with past medical history of hypertension, hypothyroidism, CAD, CHF who resides at assisted living facility was brought to the ED after patient has been experiencing generalized fatigue over the past 2 weeks. Patient also complains of chest pain which has been intermittent and not associated with shortness of breath. She also denies nausea vomiting or diarrhea. She denies fever no dysuria. Patient was seen by interpretation at the assisted living facility and was started on prednisone 10 mg by mouth daily on 12/3 for possible allergic reaction, as patient complained of burning in her tongue.   Clinical Impression   Pt admitted with above.  She presents to OT with the below listed deficits and will benefit from continued OT to maximize safety and independence with BADLs.  Currently, she requires min - min guard assist with all BADLs, but fatigues rapidly on 2L 02.  Feel she should discharge to SNF and then allow them to transition her back to ALF    OT Assessment  Patient needs continued OT Services    Follow Up Recommendations  SNF;Supervision/Assistance - 24 hour    Barriers to Discharge Decreased caregiver support    Equipment Recommendations  None recommended by OT    Recommendations for Other Services    Frequency  Min 2X/week    Precautions / Restrictions Precautions Precautions: Fall Restrictions Weight Bearing Restrictions: No   Pertinent Vitals/Pain     ADL  Eating/Feeding: Independent Where Assessed - Eating/Feeding: Chair Grooming: Wash/dry hands;Wash/dry face;Teeth care;Brushing hair;Min guard Where Assessed - Grooming: Supported standing Upper Body Bathing: Set up;Supervision/safety Where Assessed - Upper Body  Bathing: Supported sitting Lower Body Bathing: Minimal assistance Where Assessed - Lower Body Bathing: Supported sit to stand Upper Body Dressing: Supervision/safety Where Assessed - Upper Body Dressing: Unsupported sitting Lower Body Dressing: Minimal assistance Where Assessed - Lower Body Dressing: Supported sit to Lobbyist: Minimal assistance Toilet Transfer Method: Sit to stand;Stand pivot Science writer: Comfort height toilet Toileting - Water quality scientist and Hygiene: Min guard Where Assessed - Best boy and Hygiene: Standing Equipment Used: Rolling walker;Other (comment) (02) Transfers/Ambulation Related to ADLs: min guard assist with RW.  Required min A sit to stand due to posterior bias and LOB ADL Comments: Pt fatigues rapidly with grooming activities standing at sink     OT Diagnosis: Generalized weakness;Cognitive deficits  OT Problem List: Decreased strength;Decreased activity tolerance;Impaired balance (sitting and/or standing);Decreased safety awareness;Decreased knowledge of use of DME or AE OT Treatment Interventions: Self-care/ADL training;DME and/or AE instruction;Therapeutic activities;Balance training;Patient/family education   OT Goals(Current goals can be found in the care plan section) Acute Rehab OT Goals Patient Stated Goal: to [facility] to [facility] OT Goal Formulation: With patient Time For Goal Achievement: 11/24/13 Potential to Achieve Goals: Good ADL Goals Pt Will Perform Grooming: with supervision;standing Pt Will Perform Upper Body Bathing: with set-up;sitting Pt Will Perform Lower Body Bathing: with supervision;sit to/from stand Pt Will Perform Upper Body Dressing: with set-up;sitting Pt Will Perform Lower Body Dressing: with supervision;sit to/from stand Pt Will Transfer to Toilet: with supervision;ambulating;regular height toilet;grab bars Pt Will Perform Toileting - Clothing Manipulation and  hygiene: with supervision;sit to/from stand  Visit Information  Last OT Received On: 11/17/13 Assistance Needed: +1 History of Present Illness: 80 year old female with past medical history of  hypertension, hypothyroidism, CAD, CHF who resides at assisted living facility was brought to the ED after patient has been experiencing generalized fatigue over the past 2 weeks. Patient also complains of chest pain which has been intermittent and not associated with shortness of breath. She also denies nausea vomiting or diarrhea. She denies fever no dysuria. Patient was seen by interpretation at the assisted living facility and was started on prednisone 10 mg by mouth daily on 12/3 for possible allergic reaction, as patient complained of burning in her tongue.       Prior [place] expects to be discharged to:: Skilled nursing facility Home Equipment: Gilford Rile - 4 wheels Additional Comments: Pt lives in ALF at [place]: Needs assistance ADL's / Homemaking Assistance Needed: Pt reports she had assistance with showering and IADLs.   She does walk to dining room for meals Communication Communication: No difficulties Dominant Hand: Right         Vision/Perception     Cognition  Cognition Arousal/Alertness: Awake/alert Behavior During Therapy: WFL for tasks assessed/performed Overall Cognitive Status: History of cognitive impairments - at baseline (h/o Alzheimer's) Memory: Decreased short-term memory    Extremity/Trunk Assessment Upper Extremity Assessment Upper Extremity Assessment: Overall WFL for tasks assessed Lower Extremity Assessment Lower Extremity Assessment: Defer to PT evaluation Cervical / Trunk Assessment Cervical / Trunk Assessment: Kyphotic     Mobility Bed Mobility Bed Mobility: Not assessed Transfers Transfers: Sit to Stand;Stand to Sit Sit to Stand: 4: Min assist;With upper extremity  assist;From chair/3-in-1 Stand to Sit: 4: Min guard;With upper extremity assist;To chair/3-in-1 Details for Transfer Assistance: pt required min A for initial sit to stand due to posterior bias and LOB     Exercise     Balance     End of Session OT - End of Session Equipment Utilized During Treatment: Oxygen;Rolling walker Activity Tolerance: Patient limited by fatigue Patient left: in chair;with call bell/phone within reach;with nursing/sitter in room Nurse Communication: Mobility status  GO     Lucille Passy M 11/17/2013, 12:04 PM

## 2013-11-17 NOTE — Progress Notes (Addendum)
Pt  Converted to new onset afib. Heart rate in 120's. Confirmed by ekg. Called Levi Aland, NP made her aware. Vs stable.  Pt asymptomatic. No new orders, will continue to monitor.

## 2013-11-17 NOTE — Progress Notes (Signed)
CRITICAL VALUE ALERT  Critical value received:  Na 121   Date of notification:  11/17/13  Time of notification:  1500  Critical value read back:yes  Nurse who received alert:  Sharyn Lull, RN    MD notified (1st page):  Dr. Grandville Silos  Time of first page:  3:10   MD notified (2nd page): Dr. Mercy  paged and aware Per Dr. Biagio Borg request  Time of second page: 3:20   Responding MD:  Reinaldo Raddle   Time MD responded:  3:13 Grandville Silos) 3:22 Mercy )

## 2013-11-17 NOTE — Consult Note (Signed)
[facility]  Patient name: Jenny [name] Medical record number: [id number] Date of birth: October 24, 1927 Age: 80 y.o. Gender: female  Primary Care Provider: Estill Dooms, MD  Consult: Consulted for Hyponatremia  History of Present Illness: Jenny [name] is a 80 y.o. female with a past medical history of HTN, hypothyroidism, CAD and CHF who resides at an assisted living facility ([facility]). She persistent, generalized fatigue and decrease of appetitie over the past 2 weeks. She reports not eating as well as her usual, drinking about 5-6 8 ounce glasses of water/ice per day. On admission she had complaints of CP and SOB. She has cxr with LLL PNA. She has since had a drop in Hgb and noted to have GI bleed, she has completed 2U RBC. She has no history of A.dib, however she is noted to to be in A.fib with RVR.   She does not recall when her last labs were completed. She  Reports never having low sodium in the past. No recent adjustments in medications, with the exception of Lasix started yesterday for a BNP >10K. She reports she has noticed a decrease in urine over the last few days prior to admission. She denied nausea, vomit or diarrhea over the course of her illness.    Review Of Systems: Per HPI with the following additions: Otherwise 12 point review of systems was performed and was unremarkable.  Patient Active Problem List   Diagnosis Date Noted  . PAF (paroxysmal atrial fibrillation) 11/17/2013  . Anemia 11/16/2013  . Hyponatremia 11/16/2013  . Community acquired pneumonia 11/15/2013  . Pneumonia 11/15/2013  . Debility 10/16/2013  . Abnormality of gait 08/28/2013  . Bipolar I disorder, most recent episode mixed 08/28/2013  . COPD GOLD II 06/17/2013  . Pain in joint, shoulder region 06/07/2013  . Chronic kidney disease, stage III (moderate) 03/12/2013  . 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 Fracture of superior pubic ramus 10/10/2012  . CAD  (coronary artery disease) 10/10/2012  . Constipation 10/10/2012  . Degenerative arthritis of hip 05/09/2012  . Keratosis, seborrheic 02/01/2011  . Hypertensive renal disease, benign 12/26/2010  . Pain in joint involving lower leg 12/26/2010  . Spinal stenosis of lumbar region 01/30/2010  . Unspecified vitamin D deficiency 09/06/2009  . Edema 07/21/2009  . Fatigue 06/17/2008  . Senile dementia with delusional features 04/10/2007  . Spastic colon 02/20/2006  . Bronchitis, chronic 09/06/2005  . Hypothyroidism 06/21/2005  . Depressive disorder 06/21/2005  . Encounter for long-term (current) use of medications 06/21/2005  . Osteoarthritis 06/28/2004  . Shortness of breath 06/28/2004  . Dementia in conditions classified elsewhere without behavioral disturbance(294.10) 11/03/2003  . Anxiety 11/03/2003  . Late effects of CVA (cerebrovascular accident) 06/18/2003  . Migraine with aura, without mention of intractable migraine without mention of status migrainosus 09/29/2002  . Tinnitus 09/29/2002  . Carotid bruit 09/29/2002  . Vertigo 06/02/2002  . Hyperlipidemia 09/30/2001   Past Medical History: Past Medical History  Diagnosis Date  . Hypertension   . Coronary artery disease   . Heart murmur   . Hypothyroidism 05-02-12    tx. Synthroid  . Headache(784.0) 05-02-12    past hx. migrianes-many yrs ago  . Arthritis 05-02-12    osteoarthritis-rt. hip  . Pneumonia 05-02-12    mild case  . 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 Acute upper respiratory infections of unspecified site 2014  . Unspecified venous (peripheral) insufficiency 2013  . Senile osteoporosis 2013  . Unspecified closed fracture of  pelvis 10/13/2012  . Cellulitis and abscess of leg, except foot 2013  . Altered mental status 2013  . Chronic pain syndrome 2013  . Urinary tract infection, site not specified   . Hyposmolality and/or hyponatremia 05/2012  . Congestive heart failure, unspecified 05/2012  . Pneumonia, organism  unspecified 05/2012  . Pressure ulcer, buttock(707.05) 05/2012  . Hypoxemia 05/2012  . Reflux esophagitis 3013  . Unspecified constipation 2013  . Acute posthemorrhagic anemia 05/2012  . Coronary atherosclerosis of native coronary artery 05/2012  . Pain in joint, pelvic region and thigh 04/2012  . Dysphagia, unspecified(787.20) 03/2012  . Benign neoplasm of adrenal gland 10/2011  . Alzheimer's disease 06/2011  . Other seborrheic keratosis 2012  . Hypertensive renal disease, benign 2012  . Pain in joint, lower leg 2012  . Memory loss 09/2010  . Spinal stenosis, lumbar region, without neurogenic claudication 2011  . Unspecified vitamin D deficiency 2010  . Unspecified disorder of kidney and ureter 2010  . Edema 2010  . Other malaise and fatigue 2009  . Congenital factor VIII disorder 2009  . Senile dementia with delusional features 2008  . Generalized hyperhidrosis 2007  . Irritable bowel syndrome 2007  . Unspecified chronic bronchitis 2006  . Depression 06/2005     depression only,tx. oral meds  . Shortness of breath 2005  . Anxiety 2004  . Unspecified late effects of cerebrovascular disease 2004  . Migraine with aura, without mention of intractable migraine without mention of status migrainosus 2003  . Tinnitus 2003  . Other symptoms involving cardiovascular system 2003  . Hyperlipidemia 2002   Past Surgical History: Past Surgical History  Procedure Laterality Date  . Tonsillectomy    . Tubal ligation  05-02-12  .  tummy tuck  05-02-12    many yrs ago  . Cataract extraction, bilateral  05-02-12    bilateral  . Retinal detachment surgery  05-02-12    left  . Total hip arthroplasty  05/09/2012    Procedure: TOTAL HIP ARTHROPLASTY ANTERIOR APPROACH;  Surgeon: Mcarthur Rossetti, MD;  Location: [facility]S;  Service: Orthopedics;  Laterality: Right;  . Dental surgery  08/11/13    3 teeth surgical removed by Dr. Lewanda Rife   Social History: History  Substance Use Topics  . Smoking  status: Former Smoker -- 1.00 packs/day for 62 years    Types: Cigarettes    Quit date: 10/10/2008  . Smokeless tobacco: Never Used  . Alcohol Use: No   Additional social history: patient lives in an assisted living facility. Please also refer to relevant sections of EMR.  Family History: Family History  Problem Relation Age of Onset  . Diabetes Father    Allergies and Medications: Allergies  Allergen Reactions  . Levaquin [Levofloxacin In D5w] Hives  . Penicillins Rash   No current facility-administered medications on file prior to encounter.   Current Outpatient Prescriptions on File Prior to Encounter  Medication Sig Dispense Refill  . acetaminophen (TYLENOL) 325 MG tablet Take 325 mg by mouth daily as needed for mild pain. One daily as needed      . Ascorbic Acid (VITAMIN C) 1000 MG tablet Take 1,000 mg by mouth daily with breakfast.      . aspirin EC 81 MG tablet Take 81 mg by mouth daily.      . brimonidine-timolol (COMBIGAN) 0.2-0.5 % ophthalmic solution Place 1 drop into both eyes every 12 (twelve) hours.       . calcium-vitamin D (OSCAL WITH D)  500-200 MG-UNIT per tablet Take 1 tablet by mouth 3 (three) times daily. Take one tablet three times daily for calcium supplementation      . lamoTRIgine (LAMICTAL) 200 MG tablet Take 200 mg by mouth daily.      . multivitamin-lutein (OCUVITE-LUTEIN) CAPS Take 1 capsule by mouth daily.      . naproxen sodium (ANAPROX) 220 MG tablet Take 220 mg by mouth daily. Take one daily      . senna (SENOKOT) 8.6 MG TABS Take 2 tablets by mouth at bedtime.      . sertraline (ZOLOFT) 100 MG tablet Take 50 mg by mouth daily.       . traZODone (DESYREL) 50 MG tablet Take 50 mg by mouth at bedtime. Take one tablet at bedtime      . clonazePAM (KLONOPIN) 0.5 MG tablet Take 0.25 mg by mouth 3 (three) times daily as needed. Take one three times a day        Objective: BP 133/67  Pulse 96  Temp(Src) 97.6 F (36.4 C) (Oral)  Resp 20  Ht 5\' 4"   (1.626 m)  Wt 159 lb 6.4 oz (72.303 kg)  BMI 27.35 kg/m2  SpO2 97% Exam: General: NAD. Up at bedside chair eating breakfast.  HEENT: MMM, actively drinking water.  Cardiovascular: RRR.  Respiratory: Bibasilar crackles present bilaterally L>R. No wheezing or rhonchi noted. Cough present. Abdomen: Sodt. NT, mildly distended. Active BS. No masses.  Extremities: No erythema or trace edema noted. No TTP bilaterally  Neuro: alert. Oriented x3. CN intact, no focal deficits noted.  Labs and Imaging: CBC BMET   Recent Labs Lab 11/17/13 0408  WBC 13.1*  HGB 9.6*  HCT 28.8*  PLT 460*    Recent Labs Lab 11/17/13 0408  NA 125*  K 4.0  CL 87*  CO2 22  BUN 26*  CREATININE 1.08  GLUCOSE 131*  CALCIUM 8.3*     Dg Chest 1 View  11/15/2013   CLINICAL DATA:  Cough.  Evaluate for pneumonia versus edema.  EXAM: CHEST - 1 VIEW  COMPARISON:  Portable examination same date. Two-view study 06/15/2013.  FINDINGS: This examination consists of a lateral view only. The left basilar opacity appears parenchymal. No significant pleural effusion is identified. Assessment for edema on a lateral view is limited. Atherosclerosis and spondylosis are noted.  IMPRESSION: Persistent left lower lobe airspace disease suspicious for pneumonia. No significant pleural effusion.   Electronically Signed   By: Camie Patience M.D.   On: 11/15/2013 09:34   Dg Chest Port 1 View  11/15/2013   CLINICAL DATA:  Chest pain  EXAM: PORTABLE CHEST - 1 VIEW  COMPARISON:  Prior radiograph from 06/15/2013  FINDINGS: The cardiac and mediastinal silhouettes are stable in size and contour, and remain within normal limits. Prominent atherosclerotic calcifications noted within the aortic arch.  The lungs are normally inflated. There is patchy left lower lobe airspace opacity, worrisome for possible pneumonia. No definite pleural effusion identified, although the left costophrenic angles poorly evaluated. Mild diffuse pulmonary vascular congestion  is present without frank pulmonary edema. No pneumothorax. The right lung is clear.  No acute osseous abnormality identified.  IMPRESSION: Left lower lobe infiltrate, worrisome for pneumonia.   Electronically Signed   By: Jeannine Boga M.D.   On: 11/15/2013 06:14   Assessment: 1.) hyponatremia: 125 today; appears to be an acute drop during admission (131- 125) , patient has had a low baseline for Na in the past.  2.) Hypothyroid:  Last TSH 2.3 (10/10/2012) 3.) CHF: Echo 7/0/4888: Systolic function was normal. EF: ~60%. Grade 2 diastolic dysfunction. BNP >10K.  4.)  PNA: treating with azithro; recent steroid use, taper in progress.  5.)  Anemia: current GI bleed. Receiving blood products.   Plan: 1.) TSH 1.1 2.) urine studies osmo 199 after lasix; UrNA 41, Ur Cr 23.53.FeNa 1.51%. Suggesting intrinsic renal causes for  hyponatremia.  3.) consider DC Trazadone; Currently patient is taking two possible offending agents to cause hyponatremia.  4.) Fluid restriction to 1200 ml, although I suspect this is not going to be an issue for her.  5.) DC IV Lasix; lasix PO 40 mg BID.  6.) Strict I/O 7.) This is likely close to her normal. Possible component of osmo reset, however difficult to know d/[name] Lasix administration prior to studies. SSRI also maybe causing some mild hyponatremia.    Ma Hillock, DO PGY-2 [facility] 11/17/2013, 11:37 AM I have seen and examined this patient and agree with plan per Dr Raoul Pitch.  [phone] WF with hx hyponatremia dating back to labs from 2013. Overall she is not a good historian.  She ialso on 2 SSRI's.  I suspect she has underlying reset osmostat +/- component of SIADH.  Her urine is quite dilute with OSM 199 but this was done after lasix.  For now, fluid restrict to 1200cc/d, accurate I/O's, cont lasix at 40mg  po bid, and DC trazadone.  Daily bmet Jenny Mealy T,MD 11/17/2013 1:58 PM

## 2013-11-17 NOTE — Progress Notes (Signed)
MD made aware of Pt's positive hem stool. New orders given and activated. Will continue to monitor.

## 2013-11-17 NOTE — Progress Notes (Signed)
Event: Notified by RN that pt has converted to A-Fib w/ RVR w/ current rate of 115-120. 12-lead EKG obtained and reveals A-Fib w/ RVR w/ rate of 116. RN reports pt denies CP, SOB or other symptoms. No known h/o A-Fib. NP to bedside. Subjective: Pt denies any known h/o A-Fib or other abnormal heart rhythms. Pt denies CP, SOB. Nausea, dizziness or other symptoms.  Objective: Ms Jenny [name] is an 80 y/o female with past medical history of hypertension, hypothyroidism, CAD and CHF who resides at an assisted living facility. She was admitted on the am of  11/15/2012 after being brought to the ED w/ c/o persistent, generalized fatigue over the past 2 weeks. At the time of admission pt also reported intermittent CP over 2 weeks that at times was associated w/ SOB. ED eval was notable for leukocytosis and PCXR revealed LLL infiltrate. Pt had a hgb of 8.0 on admission and this am her hgb was 6.6 for which she has completed 2 units of PRBC's. Pt was hem positive today w/o signs of overt GI bleed. No known h/o GI bleed. F/u CBC at 2330 reveals hgb of 9.9 after PRBC's. EKG on admission revealed NSR, Troponins have remained negative x 4. Current EKG reveals A-Fib w/ RVR w/ rate of 116. At bedside pt noted sitting on side of bed eating crackers and soda. She is alert and appropriate. BBS diminished but otherwise CTA. Current VS T-97.3, BP-120/77, P-110-120's, R-20 w/ 02 sast of 96% on @L  [place].  Assessment/Plan: 1. A-Fib w/ RVR (New onset): Asymptomatic. Will continue to trend troponin's. Pt's rate currently low 100's to 115. Will monitor rate for now. Low threshold to attempt cardizem bolus/qtt if rate trends to greater than 120-130's sustained. Given anemia of unclear source and hem positive stool will hold anticoagulation for now. Discussed pt w/ Dr Marin Comment who is in agreement w/ plan. Will continue to monitor closely on telemetry.  Jeryl Columbia, NP-C [facility] Pager [phone]

## 2013-11-17 NOTE — Progress Notes (Signed)
[facility] PROGRESS NOTE  Jenny [name] ZOX:[id number] DOB: 06/16/1927 DOA: 11/15/2013 PCP: Estill Dooms, MD  Assessment/Plan: #1community acquired pneumonia Chest x-ray consistent with a left lower lobe pneumonia. Patient noted to have a leukocytosis on admission. Urine strep pneumococcus antigen is negative. Urine Legionella antigen is negative. Patient was initially given IV Levaquin however developed a rash and this was discontinued. Continue IV azithromycin and IV Azactam. Blood cultures pending. Follow.  #2 fatigue Questionable etiology. Likely secondary to symptomatic anemia versus acute CHF exacerbation. Clinical improvement. Cardiac enzymes were cycled which were negative x3.pro BNP now is 10172 from 4643. 2-D echo at a normal ejection fraction with no wall motion abnormalities.patient noted to have a hemoglobin of 6.6 yesterday. Patient is status post 2 units packed red blood cells with appropriate response. Hemoglobin is currently at 9.9. Anemia panel is pending.  TSH pending. PT/OT. Follow.  #3 probable acute diastolic CHF exacerbation Some clinical improvement. Cardiac enzymes negative x3. Pro BNP was elevated. 2-D echo with normal EF and no wall motion abnormalities. Continue Lasix 60 mg IV every 12. Strict I/Os. Daily weights. Continue aspirin, beta blocker.  #4 symptomatic anemia Patient with no overt GI bleed. FOBT positive. Patient with no overt GI bleed. Maybe secondary to straining. Patient is status post 2 units packed red blood cells with appropriate response. Follow H&H. If hemoglobin drops or patient has overt bleed may consider GI consultation at that time. Follow for now.  #5. PAF Patient noted to go into atrial fibrillation however currently in normal sinus rhythm. Likely secondary to symptomatic anemia and pneumonia. Cardiac enzymes negative x3. TSH is pending. 2-D echo with a normal EF and no wall motion abnormalities. Continue bysystolic. Hold on  anticoagulation secondary to anemia.  #6 chronic kidney disease stage III Stable. Follow.  #7 chest pain Likely secondary to problem #1. Patient currently chest pain-free. Cardiac enzymes negative x3. EKG with nonspecific ST-T wave changes. 2-D echo with normal EF with no wall motion abnormalities. Continue treatment as in #1 and #3. Follow.  #8 leukocytosis Likely secondary to problem #1 versus steroids. Patient had been on prednisone prior to admission. Patient on a prednisone taper. Patient on empiric IV azithromycin and IV Azactam. Follow.  #9 hyponatremia ?? Etiology. Likely secondary to hypervolemic hyponatremia secondary to acute diastolic CHF. Patient on IV Lasix. Hyponatremia not improving. Check a urine sodium. Check a urine creatinine. Check a serum osmolality. Check a urine osmolality. D/C trazodone. Will consult with renal for further evaluation and rxcs,  #10 depression Stable.continue Zoloft.  #11 dementia Stable.  #12 hypothyroidism Check a TSH. Continue Synthroid.  #13 prophylaxis SCDs for DVT prophylaxis.  Code Status: Full Family Communication: updated patient and daughter at bedside. Disposition Plan: home when medically stable.   Consultants:  none  Procedures:  2 units packed red blood cells 11/16/2013  2-D echo 11/16/2013  Chest x-ray 11/15/2013,  Antibiotics:  IV Levaquin 11/15/2013---> 11/15/2013  IV azithromycin 11/15/2013  IV Azactam 11/15/2013  HPI/Subjective: Patient states feeling better. Patient had a bowel movement yesterday. Per nursing patient had to strain. Patient denies any overt GI bleed. Patient feeling very anxious.  Objective: Filed Vitals:   11/17/13 0517  BP: 133/67  Pulse: 96  Temp: 97.6 F (36.4 C)  Resp: 20    Intake/Output Summary (Last 24 hours) at 11/17/13 1041 Last data filed at 11/16/13 1834  Gross per 24 hour  Intake    385 ml  Output      0 ml  Net    385 ml   Filed Weights   11/15/13 1146  11/16/13 0607 11/17/13 0518  Weight: 68.9 kg (151 lb 14.4 oz) 71.578 kg (157 lb 12.8 oz) 72.303 kg (159 lb 6.4 oz)    Exam:   General:  NAD  Cardiovascular: RRR  Respiratory: Minimal bibasilar crackles  Abdomen: Soft/NT/ND/+BS  Musculoskeletal: No c/c/ trace BLE edema  Data Reviewed: Basic Metabolic Panel:  Recent Labs Lab 11/15/13 0500 11/15/13 1325 11/16/13 0502 11/17/13 0408  NA 131*  --  127* 125*  K 4.6  --  4.7 4.0  CL 92*  --  91* 87*  CO2 24  --  22 22  GLUCOSE 155*  --  167* 131*  BUN 29*  --  28* 26*  CREATININE 1.47* 1.29* 1.12* 1.08  CALCIUM 9.0  --  8.0* 8.3*   Liver Function Tests:  Recent Labs Lab 11/15/13 0500 11/16/13 0502  AST 15 18  ALT 13 13  ALKPHOS 83 74  BILITOT <0.2* <0.2*  PROT 6.7 6.0  ALBUMIN 2.7* 2.2*   No results found for this basename: LIPASE, AMYLASE,  in the last 168 hours No results found for this basename: AMMONIA,  in the last 168 hours CBC:  Recent Labs Lab 11/15/13 0500 11/15/13 1325 11/16/13 0502 11/16/13 2330 11/17/13 0408  WBC 17.3* 13.8* 17.1* 14.9* 13.1*  NEUTROABS 13.0*  --   --   --   --   HGB 8.0* 7.4* 6.6* 9.9* 9.6*  HCT 24.1* 22.9* 19.9* 29.1* 28.8*  MCV 70.5* 72.0* 70.3* 74.6* 74.6*  PLT 552* 493* 486* 512* 460*   Cardiac Enzymes:  Recent Labs Lab 11/15/13 0507 11/15/13 1325 11/15/13 1815 11/16/13 0045 11/17/13 0740  TROPONINI <0.30 <0.30 <0.30 <0.30 <0.30   BNP (last 3 results)  Recent Labs  06/15/13 1531 11/15/13 1325 11/17/13 0800  PROBNP 519.0* 4643.0* 10172.0*   CBG: No results found for this basename: GLUCAP,  in the last 168 hours  Recent Results (from the past 240 hour(s))  CULTURE, BLOOD (ROUTINE X 2)     Status: None   Collection Time    11/15/13  6:45 AM      Result Value Range Status   Specimen Description BLOOD RIGHT ARM   Final   Special Requests BOTTLES DRAWN AEROBIC AND ANAEROBIC 5CC   Final   Culture  Setup Time     Final   Value: 11/15/2013 16:12      Performed at [facility]   Culture     Final   Value:        BLOOD CULTURE RECEIVED NO GROWTH TO DATE CULTURE WILL BE HELD FOR 5 DAYS BEFORE ISSUING A FINAL NEGATIVE REPORT     Performed at [facility]   Report Status PENDING   Incomplete  CULTURE, BLOOD (ROUTINE X 2)     Status: None   Collection Time    11/15/13  6:45 AM      Result Value Range Status   Specimen Description BLOOD LEFT ARM   Final   Special Requests BOTTLES DRAWN AEROBIC AND ANAEROBIC 10CC   Final   Culture  Setup Time     Final   Value: 11/15/2013 16:12     Performed at [facility]   Culture     Final   Value:        BLOOD CULTURE RECEIVED NO GROWTH TO DATE CULTURE WILL BE HELD FOR 5 DAYS BEFORE ISSUING A FINAL NEGATIVE  REPORT     Performed at [facility]   Report Status PENDING   Incomplete     Studies: No results found.  Scheduled Meds: . aspirin EC  81 mg Oral Daily  . azithromycin  500 mg Intravenous Q24H  . aztreonam  1 g Intravenous Q8H  . brimonidine  1 drop Both Eyes Q12H   And  . timolol  1 drop Both Eyes BID  . calcitonin (salmon)  1 spray Alternating Nares Daily  . enoxaparin (LOVENOX) injection  40 mg Subcutaneous Q24H  . furosemide  60 mg Intravenous BID  . lamoTRIgine  200 mg Oral Daily  . levothyroxine  88 mcg Oral QAC breakfast  . nebivolol  10 mg Oral Daily  . polyethylene glycol  17 g Oral Daily  . predniSONE  5 mg Oral Q breakfast  . senna-docusate  1 tablet Oral QHS  . sertraline  50 mg Oral Daily  . sodium chloride  3 mL Intravenous Q12H  . traZODone  50 mg Oral QHS   Continuous Infusions:   Principal Problem:   Community acquired pneumonia Active Problems:   Fatigue   Hypothyroidism   Depressive disorder   Dementia in conditions classified elsewhere without behavioral disturbance(294.10)   Anxiety   Hyperlipidemia   Constipation   Chronic kidney disease, stage III (moderate)   Debility   Pneumonia   Anemia   Hyponatremia   PAF  (paroxysmal atrial fibrillation)    Time spent: 35 mins    [facility] MD [facility] Pager [phone]. If 7PM-7AM, please contact night-coverage at [email or web address], password [facility] 11/17/2013, 10:41 AM  LOS: 2 days

## 2013-11-17 NOTE — Care Management Note (Signed)
    Page 1 of 1   11/20/2013     4:28:28 PM   CARE MANAGEMENT NOTE 11/20/2013  Patient:  Jenny [name], Jenny [name]   Account Number:  [id number]  Date Initiated:  11/17/2013  Documentation initiated by:  Azeneth Carbonell  Subjective/Objective Assessment:   PT ADM ON 11/15/13 WITH PNA, CHF.  PTA, PT RESIDES AT [facility].     Action/Plan:   CSW CONSULTED TO FACILITATE RETURN TO ALF WHEN MEDICALLY STABLE FOR DC.  WILL FOLLOW PROGRESS.   Anticipated DC Date:  11/19/2013   Anticipated DC Plan:  SKILLED NURSING FACILITY  In-house referral  Clinical Social Worker      DC Planning Services  CM consult      Choice offered to / List presented to:             Status of service:  Completed, signed off Medicare Important Message given?   (If response is "NO", the following Medicare IM given date fields will be blank) Date Medicare IM given:   Date Additional Medicare IM given:    Discharge Disposition:  [place]  Per UR Regulation:  Reviewed for med. necessity/level of care/duration of stay  If discussed at [place] of Stay Meetings, dates discussed:    Comments:  11/20/13 Glendi Mohiuddin,RN,BSN [phone] PT DISCHARGED TO SNF TODAY, PER CSW ARRANGEMENTS.  11/18/13 Jden Want,RN,BSN [phone] PT/OT RECOMMENDING SNF LOC AT DC.  [facility] SNF LEVEL AVAILABLE; PASSAR PENDING; WILL LIKELY BE ABLE TO DC ON 1/8 AFTER PASSAR # OBTAINED.  CSW TO FOLLOW.

## 2013-11-18 DIAGNOSIS — E039 Hypothyroidism, unspecified: Secondary | ICD-10-CM

## 2013-11-18 DIAGNOSIS — F0280 Dementia in other diseases classified elsewhere without behavioral disturbance: Secondary | ICD-10-CM

## 2013-11-18 LAB — BASIC METABOLIC PANEL
BUN: 23 mg/dL (ref 6–23)
CHLORIDE: 88 meq/L — AB (ref 96–112)
CO2: 23 meq/L (ref 19–32)
CREATININE: 1.07 mg/dL (ref 0.50–1.10)
Calcium: 8.3 mg/dL — ABNORMAL LOW (ref 8.4–10.5)
GFR calc Af Amer: 54 mL/min — ABNORMAL LOW (ref >90)
GFR calc non Af Amer: 46 mL/min — ABNORMAL LOW (ref >90)
GLUCOSE: 101 mg/dL — AB (ref 70–99)
POTASSIUM: 4.1 meq/L (ref 3.7–5.3)
Sodium: 125 mEq/L — ABNORMAL LOW (ref 137–147)

## 2013-11-18 NOTE — Progress Notes (Signed)
Physical Therapy Treatment Patient Details Name: Jenny [name] MRN: [id number] DOB: November 15, 1926 Today's Date: 11/18/2013 Time: 8469-6295 PT Time Calculation (min): 17 min  PT Assessment / Plan / Recommendation  History of Present Illness 80 year old female with past medical history of hypertension, hypothyroidism, CAD, CHF who resides at assisted living facility was brought to the ED after patient has been experiencing generalized fatigue over the past 2 weeks. Patient also complains of chest pain which has been intermittent and not associated with shortness of breath. She also denies nausea vomiting or diarrhea. She denies fever no dysuria. Patient was seen by interpretation at the assisted living facility and was started on prednisone 10 mg by mouth daily on 12/3 for possible allergic reaction, as patient complained of burning in her tongue.   PT Comments   Pt improving well.  Expect will not need follow up once d/c'd from hospital.  Follow Up Recommendations  No PT follow up     Does the patient have the potential to tolerate intense rehabilitation     Barriers to Discharge        Equipment Recommendations  Rolling walker with 5" wheels    Recommendations for Other Services    Frequency Min 3X/week   Progress towards PT Goals Progress towards PT goals: Progressing toward goals  Plan Current plan remains appropriate    Precautions / Restrictions Precautions Precautions: Fall Restrictions Weight Bearing Restrictions: No   Pertinent Vitals/Pain 91% sats on RA at rest.  89-91% sats on RA during gait,  quicker recovery     Mobility  Bed Mobility Overal bed mobility: Modified Independent Transfers Overall transfer level: Needs assistance Equipment used: Rolling walker (2 wheeled) Transfers: Sit to/from Stand Sit to Stand: Supervision General transfer comment: cues for transfer safety Ambulation/Gait Ambulation/Gait assistance: Min guard Ambulation Distance (Feet): 200  Feet Assistive device: Rolling walker (2 wheeled) Gait Pattern/deviations: Step-through pattern;Drifts right/left General Gait Details: slower     Exercises     PT Diagnosis:    PT Problem List:   PT Treatment Interventions:     PT Goals (current goals can now be found in the care plan section) Acute Rehab PT Goals Patient Stated Goal: to return to [facility] PT Goal Formulation: With patient/family Time For Goal Achievement: 11/23/13 Potential to Achieve Goals: Good  Visit Information  Last PT Received On: 11/18/13 Assistance Needed: +1 History of Present Illness: 80 year old female with past medical history of hypertension, hypothyroidism, CAD, CHF who resides at assisted living facility was brought to the ED after patient has been experiencing generalized fatigue over the past 2 weeks. Patient also complains of chest pain which has been intermittent and not associated with shortness of breath. She also denies nausea vomiting or diarrhea. She denies fever no dysuria. Patient was seen by interpretation at the assisted living facility and was started on prednisone 10 mg by mouth daily on 12/3 for possible allergic reaction, as patient complained of burning in her tongue.    Subjective Data  Subjective: [facility] is going to have a meeting wit me to see where I'm going to go--AL or SNF Patient Stated Goal: to return to [facility]   Cognition  Cognition Arousal/Alertness: Awake/alert Behavior During Therapy: WFL for tasks assessed/performed Overall Cognitive Status: Within Functional Limits for tasks assessed    Balance     End of Session PT - End of Session Activity Tolerance: Patient tolerated treatment well Patient left: in chair;with call bell/phone within reach;with family/visitor present Nurse Communication:  Mobility status   GP     Belford Pascucci, [name] 11/18/2013, 4:29 PM 11/18/2013  Donnella Sham, [name] [phone]  (pager)

## 2013-11-18 NOTE — Progress Notes (Signed)
Patient ID: Jenny [name], female   DOB: Jun 21, 1927, 80 y.o.   MRN: [id number] [facility]  Patient name: Jenny [name] Medical record number: [id number] Date of birth: [birth year] Age: 80 y.o. Gender: female  Primary Care Provider: Estill Dooms, MD   Subjective: Patient doing well.   Objective: Temp:  [97.3 F (36.3 C)-98.4 F (36.9 C)] 98.4 F (36.9 C) (01/07 0542) Pulse Rate:  [65-85] 70 (01/07 0542) Resp:  [18] 18 (01/07 0542) BP: (128-145)/(55-70) 145/63 mmHg (01/07 0542) SpO2:  [98 %-99 %] 99 % (01/07 0542) Weight:  [157 lb 3.2 oz (71.305 kg)] 157 lb 3.2 oz (71.305 kg) (01/07 0542) Weight change: -2 lb 3.2 oz (-0.998 kg)  Intake/Output Summary (Last 24 hours) at 11/18/13 0812 Last data filed at 11/17/13 1700  Gross per 24 hour  Intake    660 ml  Output    300 ml  Net    360 ml    Physical Exam: General: NAD.  HEENT: MMM.  Cardiovascular: RRR. 2/6 SM present  Respiratory: CTAB Abdomen: Sodt. NTND Active BS. No masses.  Extremities: No erythema, No edema noted. No TTP bilaterally  Neuro: alert. Oriented x3. CN intact, no focal deficits noted.   Laboratory:  Recent Labs Lab 11/16/13 2330 11/17/13 0408 11/17/13 1535  WBC 14.9* 13.1* 13.7*  HGB 9.9* 9.6* 9.6*  HCT 29.1* 28.8* 28.8*  PLT 512* 460* 432*    Recent Labs Lab 11/15/13 0500  11/16/13 0502 11/17/13 0408 11/17/13 1415 11/18/13 0300  NA 131*  --  127* 125* 121* 125*  K 4.6  --  4.7 4.0 4.1 4.1  CL 92*  --  91* 87* 85* 88*  CO2 24  --  22 22 22 23   BUN 29*  --  28* 26* 25* 23  CREATININE 1.47*  < > 1.12* 1.08 1.17* 1.07  CALCIUM 9.0  --  8.0* 8.3* 8.4 8.3*  PROT 6.7  --  6.0  --   --   --   BILITOT <0.2*  --  <0.2*  --   --   --   ALKPHOS 83  --  74  --   --   --   ALT 13  --  13  --   --   --   AST 15  --  18  --   --   --   GLUCOSE 155*  --  167* 131* 190* 101*  < > = values in this interval not displayed.  TSH: 1.123  Assessment:  1.) hyponatremia: drop yesterday  to 121, today returned to 125; urine studies osmo 199 after lasix; UrNA 41, Ur Cr 23.53. Patient has had a low Na baseline in the past. Trazadone DC'd yesterday. Zoloft continued at this time.  2.) Hypothyroid: TSH normal 3.) CHF: Echo 12/17/537: Systolic function was normal. EF: ~60%. Grade 2 diastolic dysfunction. BNP >10K.  PO lasix  4.) PNA: treating with azithro; recent steroid use, taper in progress.  5.) Anemia: current GI bleed. Received blood products 1/6. Currently stable.   Plan:  1.) Continue Fluid restriction to 1200 ml/day  2.) Continue PO 40 mg BID.  3.) Strict I/O  4.) This is likely close to her normal. Possible component of osmo reset, however difficult to know d/t Lasix administration prior to studies. SSRI also maybe causing some mild hyponatremia, currently still receiving zoloft.   Ma Hillock, DO 11/18/2013, 6:21 AM PGY-2, [place] I have seen and examined  this patient and agree with plan per Dr Raoul Pitch.  SNa slightly better.  Doubt I/O's are accurate.  Cont current plan for now with daily SNa. Axell Trigueros T,MD 11/18/2013 9:23 AM

## 2013-11-18 NOTE — Progress Notes (Signed)
ANTIBIOTIC CONSULT NOTE - INITIAL  Pharmacy Consult for aztreonam Indication: Community acquired pneumonia  Allergies  Allergen Reactions  . Levaquin [Levofloxacin In D5w] Hives  . Penicillins Rash    Patient Measurements: Height: 5\' 4"  (162.6 cm) Weight: 157 lb 3.2 oz (71.305 kg) IBW/kg (Calculated) : 54.7 Adjusted Body Weight:   Vital Signs: Temp: 98.4 F (36.9 C) (01/07 0542) Temp src: Oral (01/07 0542) BP: 130/69 mmHg (01/07 0920) Pulse Rate: 69 (01/07 0920) Intake/Output from previous day: 01/06 0701 - 01/07 0700 In: 780 [P.O.:780] Out: 1100 [Urine:1100] Intake/Output from this shift: Total I/O In: 490 [P.O.:490] Out: 2000 [Urine:2000]  Labs:  Recent Labs  11/16/13 2330 11/17/13 0408 11/17/13 1104 11/17/13 1415 11/17/13 1535 11/18/13 0300  WBC 14.9* 13.1*  --   --  13.7*  --   HGB 9.9* 9.6*  --   --  9.6*  --   PLT 512* 460*  --   --  432*  --   LABCREA  --   --  23.53  --   --   --   CREATININE  --  1.08  --  1.17*  --  1.07   Estimated Creatinine Clearance: 37.9 ml/min (by C-G formula based on Cr of 1.07). No results found for this basename: VANCOTROUGH, VANCOPEAK, VANCORANDOM, GENTTROUGH, GENTPEAK, GENTRANDOM, TOBRATROUGH, TOBRAPEAK, TOBRARND, AMIKACINPEAK, AMIKACINTROU, AMIKACIN,  in the last 72 hours    Assessment: 80 year old female presents from ALF with general fatigue. Noted patient appears to have had allergic rxn to levaquin in the ED - allergy list updated. Currently on Aztreonam and azithromycin for CAP. WBC trending down. Afebrile, CrCl stable. Per hospitalist, possible switch to oral antibiotics soon.   1/4: Aztreonam >> 1/4 Azithromycin>>   1/4 Blood Cx x2 >>    Goal of Therapy:  Resolution of infection  Plan:  -Follow up culture results -Aztreonam 1 g q8 hours -Azithromycin 500 mg IV Q 24 hours per MD    Albertina Parr, PharmD.  Clinical Pharmacist Pager [phone]

## 2013-11-18 NOTE — Progress Notes (Signed)
[facility] PROGRESS NOTE  Jenny [name] VOJ:[id number] DOB: Jul 16, 1927 DOA: 11/15/2013 PCP: Estill Dooms, MD  Assessment/Plan: 1. Community acquired PNA:  Chest x-ray consistent with a left lower lobe pneumonia. Patient noted to have a leukocytosis on admission. Urine strep pneumococcus antigen is negative. Urine Legionella antigen is negative. Patient was initially given IV Levaquin however developed a rash and this was discontinued. Continue IV azithromycin and IV Azactam. Blood cultures pending- negative so far. Is on d3/10 of abx. Is responding well and will consider switching to oral abx (azithro 500 daily and Amox 875 BID)  2. Acute diastolic CHF exacerbation: Cardiac enzymes negative x3. Pro BNP was elevated. 2-D echo with normal EF and no wall motion abnormalities. Continue Lasix 40 mg PO BID. Strict I/Os. Daily weights. Continue aspirin, beta blocker.  3. Symptomatic anemia:  FOBT positive. Patient with no overt GI bleed. Maybe secondary to straining. Patient is status post 2 units packed red blood cells with appropriate response. Follow H&H. If hemoglobin drops or patient has overt bleed may consider GI consultation at that time.   4. PAF: NSR currently. Likely secondary to symptomatic anemia and pneumonia. Cardiac enzymes negative x3. TSH is pending. 2-D echo with a normal EF and no wall motion abnormalities. Continue bysystolic. Hold on anticoagulation secondary to anemia.  5. CKD III: stable- improved  6. Hyponatremia: has a low baseline Na and liklely reset to lower level.  Trazodone stopped. Improved today 120->125.  Renal following.  Appreciate input.  7. Depression: continue zoloft for now.   8. Dementia: stable.  Has private sitter in room at this time.  9. Hypothyroidism: continue synthroid. TSH normal.  10. Prophylaxix: SCDs due to anemia ? Bleed.  11. Dispo: plan is for dc to SNF  Code Status: full Family Communication: spoke with patient and caregiver at  bedside Disposition Plan: will remain inpatient   Consultants:  Renal Dr. Mercy Moore 11/17/13  Procedures: 2 units packed red blood cells 11/16/2013  2-D echo 11/16/2013  Chest x-ray 11/15/2013,  Antibiotics: IV Levaquin 11/15/2013---> 11/15/2013  IV azithromycin 11/15/2013  IV Azactam 11/15/2013   HPI/Subjective: Doing well this morning. No complaints. Sitting up comfortable.  Objective: Filed Vitals:   11/18/13 0920  BP: 130/69  Pulse: 69  Temp:   Resp:     Intake/Output Summary (Last 24 hours) at 11/18/13 1101 Last data filed at 11/17/13 1700  Gross per 24 hour  Intake    660 ml  Output    300 ml  Net    360 ml   Filed Weights   11/16/13 0607 11/17/13 0518 11/18/13 0542  Weight: 71.578 kg (157 lb 12.8 oz) 72.303 kg (159 lb 6.4 oz) 71.305 kg (157 lb 3.2 oz)    Exam:   General:  No distress. Comfortable.  Cardiovascular: RRR no mrg  Respiratory: scattered crackles, good air movement  Abdomen: BS+, soft, non tender  Musculoskeletal: no edema  Data Reviewed: Basic Metabolic Panel:  Recent Labs Lab 11/15/13 0500 11/15/13 1325 11/16/13 0502 11/17/13 0408 11/17/13 1415 11/18/13 0300  NA 131*  --  127* 125* 121* 125*  K 4.6  --  4.7 4.0 4.1 4.1  CL 92*  --  91* 87* 85* 88*  CO2 24  --  22 22 22 23   GLUCOSE 155*  --  167* 131* 190* 101*  BUN 29*  --  28* 26* 25* 23  CREATININE 1.47* 1.29* 1.12* 1.08 1.17* 1.07  CALCIUM 9.0  --  8.0* 8.3* 8.4  8.3*   Liver Function Tests:  Recent Labs Lab 11/15/13 0500 11/16/13 0502  AST 15 18  ALT 13 13  ALKPHOS 83 74  BILITOT <0.2* <0.2*  PROT 6.7 6.0  ALBUMIN 2.7* 2.2*   No results found for this basename: LIPASE, AMYLASE,  in the last 168 hours No results found for this basename: AMMONIA,  in the last 168 hours CBC:  Recent Labs Lab 11/15/13 0500 11/15/13 1325 11/16/13 0502 11/16/13 2330 11/17/13 0408 11/17/13 1535  WBC 17.3* 13.8* 17.1* 14.9* 13.1* 13.7*  NEUTROABS 13.0*  --   --   --    --   --   HGB 8.0* 7.4* 6.6* 9.9* 9.6* 9.6*  HCT 24.1* 22.9* 19.9* 29.1* 28.8* 28.8*  MCV 70.5* 72.0* 70.3* 74.6* 74.6* 74.8*  PLT 552* 493* 486* 512* 460* 432*   Cardiac Enzymes:  Recent Labs Lab 11/15/13 0507 11/15/13 1325 11/15/13 1815 11/16/13 0045 11/17/13 0740  TROPONINI <0.30 <0.30 <0.30 <0.30 <0.30   BNP (last 3 results)  Recent Labs  06/15/13 1531 11/15/13 1325 11/17/13 0800  PROBNP 519.0* 4643.0* 10172.0*   CBG: No results found for this basename: GLUCAP,  in the last 168 hours  Recent Results (from the past 240 hour(s))  CULTURE, BLOOD (ROUTINE X 2)     Status: None   Collection Time    11/15/13  6:45 AM      Result Value Range Status   Specimen Description BLOOD RIGHT ARM   Final   Special Requests BOTTLES DRAWN AEROBIC AND ANAEROBIC 5CC   Final   Culture  Setup Time     Final   Value: 11/15/2013 16:12     Performed at [facility]   Culture     Final   Value:        BLOOD CULTURE RECEIVED NO GROWTH TO DATE CULTURE WILL BE HELD FOR 5 DAYS BEFORE ISSUING A FINAL NEGATIVE REPORT     Performed at [facility]   Report Status PENDING   Incomplete  CULTURE, BLOOD (ROUTINE X 2)     Status: None   Collection Time    11/15/13  6:45 AM      Result Value Range Status   Specimen Description BLOOD LEFT ARM   Final   Special Requests BOTTLES DRAWN AEROBIC AND ANAEROBIC 10CC   Final   Culture  Setup Time     Final   Value: 11/15/2013 16:12     Performed at [facility]   Culture     Final   Value:        BLOOD CULTURE RECEIVED NO GROWTH TO DATE CULTURE WILL BE HELD FOR 5 DAYS BEFORE ISSUING A FINAL NEGATIVE REPORT     Performed at [facility]   Report Status PENDING   Incomplete     Studies: No results found.  Scheduled Meds: . aspirin EC  81 mg Oral Daily  . azithromycin  500 mg Intravenous Q24H  . aztreonam  1 g Intravenous Q8H  . brimonidine  1 drop Both Eyes Q12H   And  . timolol  1 drop Both Eyes BID  . calcitonin  (salmon)  1 spray Alternating Nares Daily  . furosemide  40 mg Oral BID  . lamoTRIgine  200 mg Oral Daily  . levothyroxine  88 mcg Oral QAC breakfast  . nebivolol  10 mg Oral Daily  . polyethylene glycol  17 g Oral Daily  . senna-docusate  1 tablet Oral QHS  .  sertraline  50 mg Oral Daily  . sodium chloride  3 mL Intravenous Q12H   Continuous Infusions:   Principal Problem:   Community acquired pneumonia Active Problems:   Fatigue   Hypothyroidism   Depressive disorder   Dementia in conditions classified elsewhere without behavioral disturbance(294.10)   Anxiety   Hyperlipidemia   Constipation   Chronic kidney disease, stage III (moderate)   Debility   Pneumonia   Anemia   Hyponatremia   PAF (paroxysmal atrial fibrillation)    Time spent: 35 minutes   [facility] Pager [phone] If 7PM-7AM, please contact night-coverage at [email or web address], password [facility] 11/18/2013, 11:01 AM  LOS: 3 days

## 2013-11-18 NOTE — Progress Notes (Signed)
CSW spoke to patient about SNF option. Patient in good spirits when social worker walked in. Patient stated she wants to go to SNF for short term at [facility]. CSW awaiting level 2 [place] number.  Jeanette Caprice, MSW, [place]

## 2013-11-19 ENCOUNTER — Other Ambulatory Visit: Payer: Self-pay

## 2013-11-19 ENCOUNTER — Encounter: Payer: Self-pay | Admitting: Internal Medicine

## 2013-11-19 DIAGNOSIS — F411 Generalized anxiety disorder: Secondary | ICD-10-CM

## 2013-11-19 LAB — BASIC METABOLIC PANEL
BUN: 20 mg/dL (ref 6–23)
BUN: 19 mg/dL (ref 6–23)
CO2: 24 mEq/L (ref 19–32)
CO2: 22 mEq/L (ref 19–32)
CREATININE: 1.02 mg/dL (ref 0.50–1.10)
Calcium: 8.8 mg/dL (ref 8.4–10.5)
Calcium: 8.5 mg/dL (ref 8.4–10.5)
Chloride: 86 mEq/L — ABNORMAL LOW (ref 96–112)
Chloride: 86 mEq/L — ABNORMAL LOW (ref 96–112)
Creatinine, Ser: 0.97 mg/dL (ref 0.50–1.10)
GFR calc Af Amer: 60 mL/min — ABNORMAL LOW (ref >90)
GFR calc non Af Amer: 52 mL/min — ABNORMAL LOW (ref >90)
GFR, EST AFRICAN AMERICAN: 57 mL/min — AB (ref >90)
GFR, EST NON AFRICAN AMERICAN: 49 mL/min — AB (ref >90)
Glucose, Bld: 111 mg/dL — ABNORMAL HIGH (ref 70–99)
Glucose, Bld: 130 mg/dL — ABNORMAL HIGH (ref 70–99)
Potassium: 4.2 mEq/L (ref 3.7–5.3)
Potassium: 4 mEq/L (ref 3.7–5.3)
Sodium: 126 mEq/L — ABNORMAL LOW (ref 137–147)
Sodium: 125 mEq/L — ABNORMAL LOW (ref 137–147)

## 2013-11-19 LAB — CBC
HCT: 30.2 % — ABNORMAL LOW (ref 36.0–46.0)
HEMATOCRIT: 30.2 % — AB (ref 36.0–46.0)
HEMOGLOBIN: 10 g/dL — AB (ref 12.0–15.0)
Hemoglobin: 9.8 g/dL — ABNORMAL LOW (ref 12.0–15.0)
MCH: 24.9 pg — AB (ref 26.0–34.0)
MCH: 24.4 pg — ABNORMAL LOW (ref 26.0–34.0)
MCHC: 33.1 g/dL (ref 30.0–36.0)
MCHC: 32.5 g/dL (ref 30.0–36.0)
MCV: 75.1 fL — AB (ref 78.0–100.0)
MCV: 75.3 fL — ABNORMAL LOW (ref 78.0–100.0)
Platelets: 494 10*3/uL — ABNORMAL HIGH (ref 150–400)
Platelets: 461 10*3/uL — ABNORMAL HIGH (ref 150–400)
RBC: 4.02 MIL/uL (ref 3.87–5.11)
RBC: 4.01 MIL/uL (ref 3.87–5.11)
RDW: 17.8 % — AB (ref 11.5–15.5)
RDW: 17.9 % — ABNORMAL HIGH (ref 11.5–15.5)
WBC: 13.5 10*3/uL — ABNORMAL HIGH (ref 4.0–10.5)
WBC: 12.5 10*3/uL — ABNORMAL HIGH (ref 4.0–10.5)

## 2013-11-19 LAB — TROPONIN I: Troponin I: <0.3 ng/mL (ref <0.30)

## 2013-11-19 MED ORDER — DILTIAZEM HCL 25 MG/5ML IV SOLN
10.0000 mg | Freq: Once | INTRAVENOUS | Status: AC
  Administered 2013-11-19: 10 mg via INTRAVENOUS
  Filled 2013-11-19: qty 5

## 2013-11-19 MED ORDER — SERTRALINE HCL 25 MG PO TABS
25.0000 mg | ORAL_TABLET | Freq: Every day | ORAL | Status: DC
  Administered 2013-11-19 – 2013-11-20 (×2): 25 mg via ORAL
  Filled 2013-11-19 (×2): qty 1

## 2013-11-19 MED ORDER — AZITHROMYCIN 500 MG PO TABS
500.0000 mg | ORAL_TABLET | Freq: Every day | ORAL | Status: DC
  Administered 2013-11-19 – 2013-11-20 (×2): 500 mg via ORAL
  Filled 2013-11-19 (×2): qty 1

## 2013-11-19 MED ORDER — DILTIAZEM HCL 100 MG IV SOLR
5.0000 mg/h | INTRAVENOUS | Status: DC
  Administered 2013-11-19: 5 mg/h via INTRAVENOUS
  Filled 2013-11-19: qty 100

## 2013-11-19 MED ORDER — DILTIAZEM HCL 25 MG/5ML IV SOLN: 15.0000 mg | Freq: Once | INTRAVENOUS | Status: DC

## 2013-11-19 NOTE — Progress Notes (Signed)
[facility] PROGRESS NOTE  Jenny [name]:[id number] DOB: 04/15/1927 DOA: 11/15/2013 PCP: Estill Dooms, MD  Assessment/Plan: 1. Community acquired PNA:  Chest x-ray consistent with a left lower lobe pneumonia. Leucocytosis continues to improve. Afebrile. Urine strep pneumococcus antigen is negative. Urine Legionella antigen is negative. Patient was initially given IV Levaquin however developed a rash and this was discontinued. Continue IV azithromycin and IV Azactam. Blood cultures negative so far. Is on d4/10 of abx. Is responding well. Will switching to oral abx (azithro 500 daily and Amox 875 BID) today.  2. Acute diastolic CHF exacerbation: Cardiac enzymes negative x3 at admission. Pro BNP was elevated to >10,000 . 2-D echo 11/16/13 with normal EF and no wall motion abnormalities. Continue Lasix 40 mg PO BID. Strict I/Os. Daily weights. Continue aspirin, beta blocker.  3. Symptomatic anemia:  Stable at 9.8 today. FOBT positive. Patient with no current bleed, low MCV, low iron normal TIBC. Patient is status post 2 units packed red blood cells with appropriate response. If hemoglobin drops or patient has overt bleed may consider GI consultation at that time. Threshold for transfusion 8. Will add iron supplement.  4. PAF: AFIB with RVR overnight.  Received cardizem bolus, no improvement now on drip. TSH has been normal. 2-D echo 11/16/13 with a normal EF and no wall motion abnormalities. No anticoagulation secondary to anemia and question of GI bleed.  5. CKD III: stable- improved  6. Hyponatremia: has a low baseline Na and liklely reset to lower level.  Trazodone stopped. Stable at 125 today.  Renal following.  Appreciate input. Will need to consider stopping zoloft.   7. Depression: stable. Patient is open to stopping zoloft.  Will decrease dose today.  8. Dementia: stable.  Has private sitter in room at this time.  9. Hypothyroidism: continue synthroid. TSH  normal.  10. Prophylaxix: SCDs due to anemia ? Bleed.  11. Dispo: plan is for dc to SNF, will not be able to go today due to afib overnight.  Code Status: full Family Communication: spoke with patient and caregiver at bedside Disposition Plan: will remain inpatient   Consultants:  Renal Dr. Mercy Moore 11/17/13  Procedures: 2 units packed red blood cells 11/16/2013  2-D echo 11/16/2013  Chest x-ray 11/15/2013,  Antibiotics: IV Levaquin 11/15/2013---> 11/15/2013  IV azithromycin 11/15/2013  IV Azactam 11/15/2013   HPI/Subjective: Doing well this morning. No complaints. Sitting up comfortable.  Objective: Filed Vitals:   11/19/13 0324  BP: 145/96  Pulse: 125  Temp: 98.5 F (36.9 C)  Resp: 20    Intake/Output Summary (Last 24 hours) at 11/19/13 0710 Last data filed at 11/19/13 3664  Gross per 24 hour  Intake   1510 ml  Output   4053 ml  Net  -2543 ml   Filed Weights   11/17/13 0518 11/18/13 0542 11/19/13 0324  Weight: 72.303 kg (159 lb 6.4 oz) 71.305 kg (157 lb 3.2 oz) 71.124 kg (156 lb 12.8 oz)    Exam:   General:  No distress. Comfortable. Eating breakfast, conversant.  Cardiovascular: irregularly irregular, tachycardic  Respiratory: scattered crackles, good air movement  Abdomen: BS+, soft, non tender  Musculoskeletal: no edema  Data Reviewed: Basic Metabolic Panel:  Recent Labs Lab 11/17/13 0408 11/17/13 1415 11/18/13 0300 11/19/13 0325 11/19/13 0515  NA 125* 121* 125* 126* 125*  K 4.0 4.1 4.1 4.2 4.0  CL 87* 85* 88* 86* 86*  CO2 22 22 23 24 22   GLUCOSE 131* 190* 101* 111* 130*  BUN 26* 25* 23 20 19   CREATININE 1.08 1.17* 1.07 1.02 0.97  CALCIUM 8.3* 8.4 8.3* 8.8 8.5   Liver Function Tests:  Recent Labs Lab 11/15/13 0500 11/16/13 0502  AST 15 18  ALT 13 13  ALKPHOS 83 74  BILITOT <0.2* <0.2*  PROT 6.7 6.0  ALBUMIN 2.7* 2.2*   No results found for this basename: LIPASE, AMYLASE,  in the last 168 hours No results found for  this basename: AMMONIA,  in the last 168 hours CBC:  Recent Labs Lab 11/15/13 0500  11/16/13 2330 11/17/13 0408 11/17/13 1535 11/19/13 0325 11/19/13 0515  WBC 17.3*  < > 14.9* 13.1* 13.7* 13.5* 12.5*  NEUTROABS 13.0*  --   --   --   --   --   --   HGB 8.0*  < > 9.9* 9.6* 9.6* 10.0* 9.8*  HCT 24.1*  < > 29.1* 28.8* 28.8* 30.2* 30.2*  MCV 70.5*  < > 74.6* 74.6* 74.8* 75.1* 75.3*  PLT 552*  < > 512* 460* 432* 494* 461*  < > = values in this interval not displayed. Cardiac Enzymes:  Recent Labs Lab 11/15/13 1325 11/15/13 1815 11/16/13 0045 11/17/13 0740 11/19/13 0515  TROPONINI <0.30 <0.30 <0.30 <0.30 <0.30   BNP (last 3 results)  Recent Labs  06/15/13 1531 11/15/13 1325 11/17/13 0800  PROBNP 519.0* 4643.0* 10172.0*   CBG: No results found for this basename: GLUCAP,  in the last 168 hours  Recent Results (from the past 240 hour(s))  CULTURE, BLOOD (ROUTINE X 2)     Status: None   Collection Time    11/15/13  6:45 AM      Result Value Range Status   Specimen Description BLOOD RIGHT ARM   Final   Special Requests BOTTLES DRAWN AEROBIC AND ANAEROBIC 5CC   Final   Culture  Setup Time     Final   Value: 11/15/2013 16:12     Performed at [facility]   Culture     Final   Value:        BLOOD CULTURE RECEIVED NO GROWTH TO DATE CULTURE WILL BE HELD FOR 5 DAYS BEFORE ISSUING A FINAL NEGATIVE REPORT     Performed at [facility]   Report Status PENDING   Incomplete  CULTURE, BLOOD (ROUTINE X 2)     Status: None   Collection Time    11/15/13  6:45 AM      Result Value Range Status   Specimen Description BLOOD LEFT ARM   Final   Special Requests BOTTLES DRAWN AEROBIC AND ANAEROBIC 10CC   Final   Culture  Setup Time     Final   Value: 11/15/2013 16:12     Performed at [facility]   Culture     Final   Value:        BLOOD CULTURE RECEIVED NO GROWTH TO DATE CULTURE WILL BE HELD FOR 5 DAYS BEFORE ISSUING A FINAL NEGATIVE REPORT     Performed at  [facility]   Report Status PENDING   Incomplete     Studies: No results found.  Scheduled Meds: . aspirin EC  81 mg Oral Daily  . azithromycin  500 mg Intravenous Q24H  . aztreonam  1 g Intravenous Q8H  . brimonidine  1 drop Both Eyes Q12H   And  . timolol  1 drop Both Eyes BID  . calcitonin (salmon)  1 spray Alternating Nares Daily  . furosemide  40 mg Oral BID  . lamoTRIgine  200 mg Oral Daily  . levothyroxine  88 mcg Oral QAC breakfast  . nebivolol  10 mg Oral Daily  . polyethylene glycol  17 g Oral Daily  . senna-docusate  1 tablet Oral QHS  . sertraline  50 mg Oral Daily  . sodium chloride  3 mL Intravenous Q12H   Continuous Infusions: . diltiazem (CARDIZEM) infusion      Principal Problem:   Community acquired pneumonia Active Problems:   Fatigue   Hypothyroidism   Depressive disorder   Dementia in conditions classified elsewhere without behavioral disturbance(294.10)   Anxiety   Hyperlipidemia   Constipation   Chronic kidney disease, stage III (moderate)   Debility   Pneumonia   Anemia   Hyponatremia   PAF (paroxysmal atrial fibrillation)    Time spent: 35 minutes   [place] [facility] Pager [phone] If 7PM-7AM, please contact night-coverage at [email or web address], password [facility] 11/19/2013, 7:10 AM  LOS: 4 days

## 2013-11-19 NOTE — Progress Notes (Signed)
Patient ID: GLADY [name], female   DOB: 1927-11-11, 81 y.o.   MRN: [id number] [facility]  Patient name: Jenny [name] Medical record number: [id number] Date of birth: 1927-04-01 Age: 80 y.o. Gender: female  Primary Care Provider: Estill Dooms, MD   Subjective: Patient feels good this morning with great appetite. She is concerned about her "new heart thing."   Objective: Temp:  [98 F (36.7 C)-98.5 F (36.9 C)] 98.5 F (36.9 C) (01/08 0324) Pulse Rate:  [61-125] 125 (01/08 0324) Resp:  [17-20] 20 (01/08 0324) BP: (125-145)/(65-96) 145/96 mmHg (01/08 0324) SpO2:  [92 %-95 %] 92 % (01/08 0324) Weight:  [156 lb 12.8 oz (71.124 kg)] 156 lb 12.8 oz (71.124 kg) (01/08 0324) Weight change: -6.4 oz (-0.181 kg)  Intake/Output Summary (Last 24 hours) at 11/19/13 0727 Last data filed at 11/19/13 8756  Gross per 24 hour  Intake   1510 ml  Output   4053 ml  Net  -2543 ml    Physical Exam: General: NAD.  HEENT: MMM.  Cardiovascular: RRR. 2/6 SM present  Respiratory: CTAB Abdomen: Soft. NTND Active BS. No masses.  Extremities: No erythema, No edema noted. No TTP bilaterally  Neuro: alert. Oriented x3. CN intact, no focal deficits noted.   Laboratory:  Recent Labs Lab 11/17/13 1535 11/19/13 0325 11/19/13 0515  WBC 13.7* 13.5* 12.5*  HGB 9.6* 10.0* 9.8*  HCT 28.8* 30.2* 30.2*  PLT 432* 494* 461*    Recent Labs Lab 11/15/13 0500  11/16/13 0502  11/18/13 0300 11/19/13 0325 11/19/13 0515  NA 131*  --  127*  < > 125* 126* 125*  K 4.6  --  4.7  < > 4.1 4.2 4.0  CL 92*  --  91*  < > 88* 86* 86*  CO2 24  --  22  < > 23 24 22   BUN 29*  --  28*  < > 23 20 19   CREATININE 1.47*  < > 1.12*  < > 1.07 1.02 0.97  CALCIUM 9.0  --  8.0*  < > 8.3* 8.8 8.5  PROT 6.7  --  6.0  --   --   --   --   BILITOT <0.2*  --  <0.2*  --   --   --   --   ALKPHOS 83  --  74  --   --   --   --   ALT 13  --  13  --   --   --   --   AST 15  --  18  --   --   --   --   GLUCOSE 155*   --  167*  < > 101* 111* 130*  < > = values in this interval not displayed.  TSH: 1.123  Assessment:  1.) hyponatremia: Stable today 125. urine studies osmo 199 after lasix; UrNA 41, Ur Cr 23.53. Patient has had a low Na baseline in the past. Trazadone DC'd this admission.  Zoloft continued at this time.  2.) Hypothyroid: TSH normal 3.) CHF: Echo 02/12/3294: Systolic function was normal. EF: ~60%. Grade 2 diastolic dysfunction. BNP >10K.  PO lasix  4.) PNA: treating with azithro; recent steroid use, taper in progress.  5.) Anemia: current GI bleed. Received blood products (2u) 1/5. Currently stable.   Plan:  1.) Continue Fluid restriction to 1200 ml/day  2.) Continue PO 40 mg BID.  3.) Strict I/O: improved with good output.  4.) Na 125, hyponatremia,  is likely close to her normal. Possible component of osmo reset, however difficult to know d/t Lasix administration prior to studies. SSRI also maybe causing some mild hyponatremia, currently still receiving zoloft. Continue to monitor [place], DO 11/19/2013, 7:27 AM PGY-2 I have seen and examined this patient and agree with plan per Dr Raoul Pitch.  SNa about the same.  UO excellent.  Leave on current dose of lasix but if puts out another 4 liters today. Then will decrease dose.  Suspect she will need to be on lasix long term. Elizar Alpern T,MD 11/19/2013 10:35 AM

## 2013-11-19 NOTE — Progress Notes (Signed)
Comments:   Notified by RN that pt back in A-Fib w/ RVR w/ rate in 130-140's, sustained. Asymptomatic. Likely secondary to symptomatic anemia and pneumonia. First episode 11/17/2012 but pt converted back to NSR. At bedside pt noted resting quietly in NAD. Remaining VSS. Troponin's pending. Will attempt Cardizem bolus and if no improvement in rate will start Cardzem drip. Will continue to hold on anticoagulation secondary to anemia. Will continue to monitor closely on telemetry and defer further changes in pt's plan of care to rounding MD.   Jeryl [place], NP-C [facility] Pager [phone]

## 2013-11-19 NOTE — Progress Notes (Signed)
Physical Therapy Treatment Patient Details Name: Jenny [name] MRN: [id number] DOB: 29-Apr-1927 Today's Date: 11/19/2013 Time: 8841-6606 PT Time Calculation (min): 23 min  PT Assessment / Plan / Recommendation  History of Present Illness 80 year old female with past medical history of hypertension, hypothyroidism, CAD, CHF who resides at assisted living facility was brought to the ED after patient has been experiencing generalized fatigue over the past 2 weeks. Patient also complains of chest pain which has been intermittent and not associated with shortness of breath. She also denies nausea vomiting or diarrhea. She denies fever no dysuria. Patient was seen by interpretation at the assisted living facility and was started on prednisone 10 mg by mouth daily on 12/3 for possible allergic reaction, as patient complained of burning in her tongue.   PT Comments   Pt concerned about overdoing herself with the walking just after waking up, but EHR though in afib stayed between 90 and infrequently the low 110's bpm.  sats on 2L stay around 92%.   Follow Up Recommendations  SNF     Does the patient have the potential to tolerate intense rehabilitation     Barriers to Discharge        Equipment Recommendations  Rolling walker with 5" wheels    Recommendations for Other Services    Frequency Min 3X/week   Progress towards PT Goals Progress towards PT goals: Progressing toward goals  Plan Current plan remains appropriate    Precautions / Restrictions Precautions Precautions: Fall Restrictions Weight Bearing Restrictions: No   Pertinent Vitals/Pain     Mobility  Bed Mobility Overal bed mobility: Modified Independent Transfers Overall transfer level: Needs assistance Equipment used: Rolling walker (2 wheeled) Transfers: Sit to/from Stand Sit to Stand: Min guard General transfer comment: pt reported feeling weak and out of breath today and needed a little for lifting and coming forward  assist Ambulation/Gait Ambulation/Gait assistance: Min assist Ambulation Distance (Feet): 80 Feet (times 2) Assistive device: Rolling walker (2 wheeled) Gait Pattern/deviations: Step-through pattern General Gait Details: slower     Exercises     PT Diagnosis:    PT Problem List:   PT Treatment Interventions:     PT Goals (current goals can now be found in the care plan section) Acute Rehab PT Goals Patient Stated Goal: to return to [facility] PT Goal Formulation: With patient/family Time For Goal Achievement: 11/23/13 Potential to Achieve Goals: Good  Visit Information  Last PT Received On: 11/19/13 Assistance Needed: +1 History of Present Illness: 80 year old female with past medical history of hypertension, hypothyroidism, CAD, CHF who resides at assisted living facility was brought to the ED after patient has been experiencing generalized fatigue over the past 2 weeks. Patient also complains of chest pain which has been intermittent and not associated with shortness of breath. She also denies nausea vomiting or diarrhea. She denies fever no dysuria. Patient was seen by interpretation at the assisted living facility and was started on prednisone 10 mg by mouth daily on 12/3 for possible allergic reaction, as patient complained of burning in her tongue.    Subjective Data  Subjective: So I'm going to the skilled part of [facility] for a while? Patient Stated Goal: to return to [facility]   Cognition  Cognition Arousal/Alertness: Awake/alert Behavior During Therapy: WFL for tasks assessed/performed Overall Cognitive Status: Within Functional Limits for tasks assessed    Balance  Balance Overall balance assessment: Needs assistance Sitting-balance support: No upper extremity supported Sitting balance-Leahy Scale: Good Standing  balance support: Bilateral upper extremity supported Standing balance-Leahy Scale: Fair  End of Session PT - End of Session Equipment Utilized  During Treatment: Oxygen Activity Tolerance: Patient tolerated treatment well;Other (comment) (pt reported feeling SOB, but vitals stable) Patient left: in chair;with call bell/phone within reach;with family/visitor present Nurse Communication: Mobility status   GP     Travonna Swindle, [name] 11/19/2013, 6:36 PM 11/19/2013  Donnella Sham, PT [phone]  (pager)

## 2013-11-19 NOTE — Progress Notes (Signed)
CSW continuing to follow patient for disposition. CSW updated [facility] facility with clinicals.  Jeanette Caprice, MSW, [place]

## 2013-11-19 NOTE — Progress Notes (Signed)
Notified hospitalist this am that patient converted to Afib with rvr rate 120-130's. Order for one time dose 10mg  of cardizem. Pt continue to be afib. Order for cardizem given. Monitoring will continue.

## 2013-11-20 LAB — RENAL FUNCTION PANEL
ALBUMIN: 2.3 g/dL — AB (ref 3.5–5.2)
BUN: 18 mg/dL (ref 6–23)
CALCIUM: 8.3 mg/dL — AB (ref 8.4–10.5)
CO2: 23 mEq/L (ref 19–32)
Chloride: 88 mEq/L — ABNORMAL LOW (ref 96–112)
Creatinine, Ser: 0.96 mg/dL (ref 0.50–1.10)
GFR calc Af Amer: 61 mL/min — ABNORMAL LOW (ref >90)
GFR calc non Af Amer: 53 mL/min — ABNORMAL LOW (ref >90)
GLUCOSE: 109 mg/dL — AB (ref 70–99)
PHOSPHORUS: 2.8 mg/dL (ref 2.3–4.6)
Potassium: 3.6 mEq/L — ABNORMAL LOW (ref 3.7–5.3)
Sodium: 125 mEq/L — ABNORMAL LOW (ref 137–147)

## 2013-11-20 LAB — CBC
HEMATOCRIT: 27 % — AB (ref 36.0–46.0)
Hemoglobin: 9.3 g/dL — ABNORMAL LOW (ref 12.0–15.0)
MCH: 25.6 pg — ABNORMAL LOW (ref 26.0–34.0)
MCHC: 34.4 g/dL (ref 30.0–36.0)
MCV: 74.4 fL — AB (ref 78.0–100.0)
Platelets: 427 10*3/uL — ABNORMAL HIGH (ref 150–400)
RBC: 3.63 MIL/uL — ABNORMAL LOW (ref 3.87–5.11)
RDW: 18.4 % — AB (ref 11.5–15.5)
WBC: 10 10*3/uL (ref 4.0–10.5)

## 2013-11-20 MED ORDER — AZITHROMYCIN 500 MG PO TABS: 500.0000 mg | ORAL_TABLET | Freq: Every day | ORAL | Status: DC

## 2013-11-20 MED ORDER — SERTRALINE HCL 25 MG PO TABS: 25.0000 mg | ORAL_TABLET | Freq: Every day | ORAL | Status: DC

## 2013-11-20 MED ORDER — HYDRALAZINE HCL 20 MG/ML IJ SOLN
5.0000 mg | Freq: Four times a day (QID) | INTRAMUSCULAR | Status: DC | PRN
  Administered 2013-11-20: 5 mg via INTRAVENOUS
  Filled 2013-11-20: qty 1

## 2013-11-20 MED ORDER — ASPIRIN 325 MG PO TABS: 325.0000 mg | ORAL_TABLET | Freq: Every day | ORAL | Status: DC

## 2013-11-20 MED ORDER — FUROSEMIDE 40 MG PO TABS: 20.0000 mg | ORAL_TABLET | Freq: Two times a day (BID) | ORAL | Status: DC

## 2013-11-20 MED ORDER — POTASSIUM CHLORIDE CRYS ER 20 MEQ PO TBCR
30.0000 meq | EXTENDED_RELEASE_TABLET | Freq: Once | ORAL | Status: AC
  Administered 2013-11-20: 30 meq via ORAL
  Filled 2013-11-20: qty 1

## 2013-11-20 NOTE — Progress Notes (Signed)
Patient ID: Jenny [name], female   DOB: April 13, 1927, 80 y.o.   MRN: [id number] [facility]  Patient name: Jenny [name] Medical record number: [id number] Date of birth: 1926-11-23 Age: 80 y.o. Gender: female  Primary Care Provider: Estill Dooms, MD   Subjective: pt reports she feels much better and would like to go home. She and nurse report she had good UOP yesterday, although it is not recorded. Appetite is good.  Objective: Temp:  [97.4 F (36.3 C)-98 F (36.7 C)] 97.4 F (36.3 C) (01/09 0436) Pulse Rate:  [58-111] 66 (01/09 0436) Resp:  [17-20] 20 (01/09 0436) BP: (106-162)/(62-82) 162/82 mmHg (01/09 0436) SpO2:  [93 %-95 %] 93 % (01/09 0436) Weight:  [157 lb 1.6 oz (71.26 kg)] 157 lb 1.6 oz (71.26 kg) (01/09 0444) Weight change: 4.8 oz (0.136 kg)  Intake/Output Summary (Last 24 hours) at 11/20/13 0817 Last data filed at 11/19/13 1700  Gross per 24 hour  Intake    360 ml  Output    401 ml  Net    -41 ml   UOP: Not recorded in chart  Physical Exam: General: NAD.  HEENT: MMM.  Cardiovascular: RRR. 3/6 SM present  Respiratory: Mild crackles in bases bilaterally. No wheeze.  Abdomen: Soft. NTND Active BS. No masses.  Extremities: No erythema, trace edema noted. No TTP bilaterally  Neuro: alert. Oriented x3. CN intact, no focal deficits noted.   Laboratory:  Recent Labs Lab 11/19/13 0325 11/19/13 0515 11/20/13 0534  WBC 13.5* 12.5* 10.0  HGB 10.0* 9.8* 9.3*  HCT 30.2* 30.2* 27.0*  PLT 494* 461* 427*    Recent Labs Lab 11/15/13 0500  11/16/13 0502  11/19/13 0325 11/19/13 0515 11/20/13 0534  NA 131*  --  127*  < > 126* 125* 125*  K 4.6  --  4.7  < > 4.2 4.0 3.6*  CL 92*  --  91*  < > 86* 86* 88*  CO2 24  --  22  < > 24 22 23   BUN 29*  --  28*  < > 20 19 18   CREATININE 1.47*  < > 1.12*  < > 1.02 0.97 0.96  CALCIUM 9.0  --  8.0*  < > 8.8 8.5 8.3*  PROT 6.7  --  6.0  --   --   --   --   BILITOT <0.2*  --  <0.2*  --   --   --   --    ALKPHOS 83  --  74  --   --   --   --   ALT 13  --  13  --   --   --   --   AST 15  --  18  --   --   --   --   GLUCOSE 155*  --  167*  < > 111* 130* 109*  < > = values in this interval not displayed.  TSH: 1.123  Assessment:  1.) hyponatremia: Stable today @ 125. urine studies osmo 199 after lasix; UrNA 41, Ur Cr 23.53. Patient has had a low Na baseline in the past. Trazadone DC'd this admission.  Zoloft continued at this time.  2.) Hypothyroid: TSH normal 3.) CHF: Echo 06/18/5642: Systolic function was normal. EF: ~60%. Grade 2 diastolic dysfunction. BNP >10K.  PO lasix  4.) PNA: treating with azithro; recent steroid use, taper in progress.  5.) Anemia: current GI bleed. Received blood products (2u) 1/5. Decreasing slowly.  6.) Hypokalemia: K steadily decreasing (3.6 today)  Plan:  1.) Continue Fluid restriction to 1200 ml/day  2.) Continue PO 40 mg BID.  3.) Strict I/O 4.) Supplement Potassium 5.) Na 125, hyponatremia,  is likely close to her normal. Possible component of osmo reset, however difficult to know d/t Lasix administration prior to studies. SSRI also maybe causing some mild hyponatremia, currently still receiving zoloft. Continue to monitor [name], DO 11/20/2013, 8:17 AM PGY-2 I have seen and examined this patient and agree with plan per Dr Raoul Pitch.  SNa stable. Note plans for DC.  Would Dc on lasix 20mg  BID.  Plan discussed with pt and Daughter. [name],MD 11/20/2013 10:13 AM

## 2013-11-20 NOTE — Discharge Summary (Signed)
Physician Discharge Summary  Jenny [name] QQP:[id number] DOB: 1927-02-23 DOA: 11/15/2013  PCP: Estill Dooms, MD  Admit date: 11/15/2013 Discharge date: 11/20/2013  Time spent: 50 minutes  Recommendations for Outpatient Follow-up:  Will need to follow up with PCP Dr. Nyoka Cowden next week.  At that time medications will need to be reconciled as multiple changes have been made during admission.  A BMET and CBC should be checked to follow hyponatremia and anemia.  If HR remains >60 beta blocker should be resumed at that time.   Discharge Diagnoses:  Principal Problem:   Community acquired pneumonia Active Problems:   Fatigue   Hypothyroidism   Depressive disorder   Dementia in conditions classified elsewhere without behavioral disturbance(294.10)   Anxiety   Hyperlipidemia   Constipation   Chronic kidney disease, stage III (moderate)   Debility   Pneumonia   Anemia   Hyponatremia   PAF (paroxysmal atrial fibrillation)   Discharge Condition: good  Diet recommendation: heart healthy  Filed Weights   11/18/13 0542 11/19/13 0324 11/20/13 0444  Weight: 71.305 kg (157 lb 3.2 oz) 71.124 kg (156 lb 12.8 oz) 71.26 kg (157 lb 1.6 oz)    History of present illness:  80 year old female with past medical history of hypertension, hypothyroidism, CAD, CHF who resides at assisted living facility was brought to the ED after patient has been experiencing generalized fatigue over the past 2 weeks. Patient also complains of chest pain which has been intermittent and not associated with shortness of breath. She also denies nausea vomiting or diarrhea. She denies fever no dysuria. Patient was seen by interpretation at the assisted living facility and was started on prednisone 10 mg by mouth daily on 12/3 for possible allergic reaction, as patient complained of burning in her tongue.  Today in the ED patient is found to have leukocytosis, portable chest x-ray showed possible left lower lobe  infiltrate.  Hospital Course:  1.  Community acquired pneumonia: Chest x-ray consistent with a left lower lobe pneumonia. Patient noted to have a leukocytosis on admission. Urine strep pneumococcus antigen is negative. Urine Legionella antigen is negative. Patient was initially given IV Levaquin however developed a rash and this was discontinued. Transitioned to IV azithromycin and IV Azactam. Blood cultures negative. Is discharged on Azithromycin 500 mg for 4 additional days to complete 10 d course.  2. PAF (paroxysmal atrial fibrillation)Hypothyroidism: Had A fib with RVR on 11/18/13 treated with cardizem drip.  Converted to NSR brady at 40 and has remained fairly bradycardic in the 50's for over 24 hours. Beta blockers currently being held. This will need to be reassessed by PCP.  BB will likely need to be restarted soon. 2-D echo with a normal EF and no wall motion abnormalities  3.   Hyponatremia: She seems to have a chronic mild hyponatremia.  During admission Na dropped from 131 to 125.  She was evaluated by nephrology.  Trazodone discontinued and zoloft dose decreased in attempt to improve sodium level.  So far remains low.     4.  Anemia:  No overt bleed, though FOBT was positive. Iron levels low, TIBC normal, ferritin 60. Hgb dropped to 6.6 with initial fluid resuscitation and she received 2 U prbs with appropriate response.  Hgb has been stable since.  5. Chronic kidney disease, stage III (moderate): stable.  Furosemide dose decreased on discharge due to large volume UOP. 6.  Depressive disorder/anxiety: stable.  Note several medication changes have been made.  7.   Dementia in conditions classified elsewhere without behavioral disturbance: stable 8. Hypothyroidism: TSH normal. No changes   Procedures: 2 units packed red blood cells 11/16/2013  2-D echo 11/16/2013   Chest x-ray 11/15/2013,  Consultations:  nephrology  Discharge Exam: Filed Vitals:   11/20/13 0436  BP: 162/82  Pulse: 66  Temp: 97.4 F (36.3 C)  Resp: 20    General: alert, no distress Cardiovascular: irreg, rate controled, no mrg Respiratory: bibasilar crackles, no distress, good air movement EXT: no edema  Discharge Instructions  Discharge Orders   Future Appointments Provider Department Dept Phone   12/09/2013 11:30 AM Star Age, MD [facility] [phone]   10/04/2014 2:15 PM Hayden Pedro, MD [place] [phone]   Future Orders Complete By Expires   Call MD for:  difficulty breathing, headache or visual disturbances  As directed    Call MD for:  extreme fatigue  As directed    Call MD for:  persistant nausea and vomiting  As directed    Call MD for:  temperature >100.4  As directed    Diet - low sodium heart healthy  As directed    Increase activity slowly  As directed        Medication List    STOP taking these medications       aspirin EC 81 MG tablet     furosemide 40 MG tablet  Commonly known as:  LASIX     nebivolol 10 MG tablet  Commonly known as:  BYSTOLIC     traZODone 50 MG tablet  Commonly known as:  DESYREL      TAKE these medications       acetaminophen 325 MG tablet  Commonly known as:  TYLENOL  Take 325 mg by mouth daily as needed for mild pain. One daily as needed     azithromycin 500 MG tablet  Commonly known as:  ZITHROMAX  Take 1 tablet (500 mg total) by mouth daily.     calcitonin (salmon) 200 UNIT/ACT nasal spray  Commonly known as:  MIACALCIN/FORTICAL  Place 1 spray into alternate nostrils daily. Use one spray in one nostril once daily. Alternate nostrils daily     calcium-vitamin D 500-200 MG-UNIT per tablet  Commonly known as:  OSCAL WITH D  Take 1 tablet by mouth 3 (three) times daily. Take one tablet three times daily for calcium supplementation     clonazePAM  0.5 MG tablet  Commonly known as:  KLONOPIN  Take 0.25 mg by mouth 3 (three) times daily as needed. Take one three times a day     COMBIGAN 0.2-0.5 % ophthalmic solution  Generic drug:  brimonidine-timolol  Place 1 drop into both eyes every 12 (twelve) hours.     lamoTRIgine 200 MG tablet  Commonly known as:  LAMICTAL  Take 200 mg by mouth daily.     levothyroxine 88 MCG tablet  Commonly known as:  SYNTHROID, LEVOTHROID  Take 88 mcg by mouth daily before breakfast.     multivitamin-lutein Caps capsule  Take 1 capsule by mouth daily.     naproxen sodium 220 MG tablet  Commonly known as:  ANAPROX  Take 220 mg by mouth daily. Take one daily     potassium chloride SA 20  MEQ tablet  Commonly known as:  K-DUR,KLOR-CON  Take 20 mEq by mouth 2 (two) times daily.     senna 8.6 MG Tabs tablet  Commonly known as:  SENOKOT  Take 2 tablets by mouth at bedtime.     sertraline 25 MG tablet  Commonly known as:  ZOLOFT  Take 1 tablet (25 mg total) by mouth daily.     vitamin C 1000 MG tablet  Take 1,000 mg by mouth daily with breakfast.       Allergies  Allergen Reactions  . Levaquin [Levofloxacin In D5w] Hives  . Penicillins Rash       Follow-up Information   Follow up with GREEN, Viviann Spare, MD In 1 week.   Specialty:  Internal Medicine   Contact information:   [address] [place] [address] [phone]        The results of significant diagnostics from this hospitalization (including imaging, microbiology, ancillary and laboratory) are listed below for reference.    Significant Diagnostic Studies: Dg Chest 1 View  11/15/2013   CLINICAL DATA:  Cough.  Evaluate for pneumonia versus edema.  EXAM: CHEST - 1 VIEW  COMPARISON:  Portable examination same date. Two-view study 06/15/2013.  FINDINGS: This examination consists of a lateral view only. The left basilar opacity appears parenchymal. No significant pleural effusion is identified. Assessment for edema on a  lateral view is limited. Atherosclerosis and spondylosis are noted.  IMPRESSION: Persistent left lower lobe airspace disease suspicious for pneumonia. No significant pleural effusion.   Electronically Signed   By: Camie Patience M.D.   On: 11/15/2013 09:34   Dg Chest Port 1 View  11/15/2013   CLINICAL DATA:  Chest pain  EXAM: PORTABLE CHEST - 1 VIEW  COMPARISON:  Prior radiograph from 06/15/2013  FINDINGS: The cardiac and mediastinal silhouettes are stable in size and contour, and remain within normal limits. Prominent atherosclerotic calcifications noted within the aortic arch.  The lungs are normally inflated. There is patchy left lower lobe airspace opacity, worrisome for possible pneumonia. No definite pleural effusion identified, although the left costophrenic angles poorly evaluated. Mild diffuse pulmonary vascular congestion is present without frank pulmonary edema. No pneumothorax. The right lung is clear.  No acute osseous abnormality identified.  IMPRESSION: Left lower lobe infiltrate, worrisome for pneumonia.   Electronically Signed   By: Jeannine Boga M.D.   On: 11/15/2013 06:14    Microbiology: Recent Results (from the past 240 hour(s))  CULTURE, BLOOD (ROUTINE X 2)     Status: None   Collection Time    11/15/13  6:45 AM      Result Value Range Status   Specimen Description BLOOD RIGHT ARM   Final   Special Requests BOTTLES DRAWN AEROBIC AND ANAEROBIC 5CC   Final   Culture  Setup Time     Final   Value: 11/15/2013 16:12     Performed at [facility]   Culture     Final   Value:        BLOOD CULTURE RECEIVED NO GROWTH TO DATE CULTURE WILL BE HELD FOR 5 DAYS BEFORE ISSUING A FINAL NEGATIVE REPORT     Performed at [facility]   Report Status PENDING   Incomplete  CULTURE, BLOOD (ROUTINE X 2)     Status: None   Collection Time    11/15/13  6:45 AM      Result Value Range Status   Specimen Description BLOOD LEFT ARM  Final   Special Requests BOTTLES DRAWN  AEROBIC AND ANAEROBIC 10CC   Final   Culture  Setup Time     Final   Value: 11/15/2013 16:12     Performed at [facility]   Culture     Final   Value:        BLOOD CULTURE RECEIVED NO GROWTH TO DATE CULTURE WILL BE HELD FOR 5 DAYS BEFORE ISSUING A FINAL NEGATIVE REPORT     Performed at [facility]   Report Status PENDING   Incomplete     Labs: Basic Metabolic Panel:  Recent Labs Lab 11/17/13 1415 11/18/13 0300 11/19/13 0325 11/19/13 0515 11/20/13 0534  NA 121* 125* 126* 125* 125*  K 4.1 4.1 4.2 4.0 3.6*  CL 85* 88* 86* 86* 88*  CO2 22 23 24 22 23   GLUCOSE 190* 101* 111* 130* 109*  BUN 25* 23 20 19 18   CREATININE 1.17* 1.07 1.02 0.97 0.96  CALCIUM 8.4 8.3* 8.8 8.5 8.3*  PHOS  --   --   --   --  2.8   Liver Function Tests:  Recent Labs Lab 11/15/13 0500 11/16/13 0502 11/20/13 0534  AST 15 18  --   ALT 13 13  --   ALKPHOS 83 74  --   BILITOT <0.2* <0.2*  --   PROT 6.7 6.0  --   ALBUMIN 2.7* 2.2* 2.3*   No results found for this basename: LIPASE, AMYLASE,  in the last 168 hours No results found for this basename: AMMONIA,  in the last 168 hours CBC:  Recent Labs Lab 11/15/13 0500  11/17/13 0408 11/17/13 1535 11/19/13 0325 11/19/13 0515 11/20/13 0534  WBC 17.3*  < > 13.1* 13.7* 13.5* 12.5* 10.0  NEUTROABS 13.0*  --   --   --   --   --   --   HGB 8.0*  < > 9.6* 9.6* 10.0* 9.8* 9.3*  HCT 24.1*  < > 28.8* 28.8* 30.2* 30.2* 27.0*  MCV 70.5*  < > 74.6* 74.8* 75.1* 75.3* 74.4*  PLT 552*  < > 460* 432* 494* 461* 427*  < > = values in this interval not displayed. Cardiac Enzymes:  Recent Labs Lab 11/15/13 1325 11/15/13 1815 11/16/13 0045 11/17/13 0740 11/19/13 0515  TROPONINI <0.30 <0.30 <0.30 <0.30 <0.30   BNP: BNP (last 3 results)  Recent Labs  06/15/13 1531 11/15/13 1325 11/17/13 0800  PROBNP 519.0* 4643.0* 10172.0*   CBG: No results found for this basename: GLUCAP,  in the last 168  hours     Signed:  Bryam Taborda  [facility] 11/20/2013, 1:13 PM

## 2013-11-20 NOTE — Discharge Instructions (Signed)
You should follow up with Dr. Nyoka Cowden next week. Hold bystolic until seen by Dr. Nyoka Cowden next week. Note that dose of Lasix has been decreased to 20 mg twice a day. Note that trazodone has been stopped. Note that dose of zoloft has been decreased to 25 mg daily. Note that dose of Aspirin has been increased to 325 mg daily.

## 2013-11-20 NOTE — Progress Notes (Signed)
Discharged to snf  Report called

## 2013-11-20 NOTE — Progress Notes (Signed)
CSW spoke with patient and patient's daughter about dc plans today. Patient stated she was so happy to be going back to [facility] today. CSW asked how patient would like to be transferred there and the daughter stated EMS. CSW updated [facility] and will help facilitate dc.  Jeanette Caprice, MSW, [place]

## 2013-11-20 NOTE — Progress Notes (Signed)
Clinical Social Worker facilitated patient discharge by contacting the patient, family and facility, [facility]. Patient agreeable to this plan and arranging transport via EMS. CSW will sign off, as social work intervention is no longer needed.  Jeanette Caprice, MSW, [place]

## 2013-11-21 LAB — CULTURE, BLOOD (ROUTINE X 2)
CULTURE: NO GROWTH
Culture: NO GROWTH

## 2013-11-23 ENCOUNTER — Non-Acute Institutional Stay (SKILLED_NURSING_FACILITY): Payer: Medicare Other | Admitting: Nurse Practitioner

## 2013-11-23 DIAGNOSIS — F0280 Dementia in other diseases classified elsewhere without behavioral disturbance: Secondary | ICD-10-CM

## 2013-11-23 DIAGNOSIS — F411 Generalized anxiety disorder: Secondary | ICD-10-CM

## 2013-11-23 DIAGNOSIS — N183 Chronic kidney disease, stage 3 unspecified: Secondary | ICD-10-CM

## 2013-11-23 DIAGNOSIS — E871 Hypo-osmolality and hyponatremia: Secondary | ICD-10-CM

## 2013-11-23 DIAGNOSIS — R079 Chest pain, unspecified: Secondary | ICD-10-CM

## 2013-11-23 DIAGNOSIS — F419 Anxiety disorder, unspecified: Secondary | ICD-10-CM

## 2013-11-23 DIAGNOSIS — E039 Hypothyroidism, unspecified: Secondary | ICD-10-CM

## 2013-11-23 DIAGNOSIS — J189 Pneumonia, unspecified organism: Secondary | ICD-10-CM

## 2013-11-23 DIAGNOSIS — D649 Anemia, unspecified: Secondary | ICD-10-CM

## 2013-11-23 DIAGNOSIS — I471 Supraventricular tachycardia: Secondary | ICD-10-CM

## 2013-11-23 DIAGNOSIS — M48061 Spinal stenosis, lumbar region without neurogenic claudication: Secondary | ICD-10-CM

## 2013-11-23 DIAGNOSIS — K5900 Constipation, unspecified: Secondary | ICD-10-CM

## 2013-11-26 ENCOUNTER — Encounter: Payer: Self-pay | Admitting: Nurse Practitioner

## 2013-11-26 DIAGNOSIS — I471 Supraventricular tachycardia, unspecified: Secondary | ICD-10-CM | POA: Insufficient documentation

## 2013-11-26 DIAGNOSIS — R079 Chest pain, unspecified: Secondary | ICD-10-CM | POA: Insufficient documentation

## 2013-11-26 LAB — CBC AND DIFFERENTIAL
HEMATOCRIT: 30 % — AB (ref 36–46)
Hemoglobin: 9.4 g/dL — AB (ref 12.0–16.0)
PLATELETS: 544 10*3/uL — AB (ref 150–399)
WBC: 7.4 10^3/mL

## 2013-11-26 LAB — BASIC METABOLIC PANEL
BUN: 12 mg/dL (ref 4–21)
Creatinine: 1.1 mg/dL (ref 0.5–1.1)
Glucose: 97 mg/dL
Potassium: 4.9 mmol/L (ref 3.4–5.3)
Sodium: 134 mmol/L — AB (ref 137–147)

## 2013-11-26 NOTE — Assessment & Plan Note (Addendum)
Memory deficits noted.

## 2013-11-26 NOTE — Assessment & Plan Note (Signed)
No overt bleed, though FOBT was positive. Iron levels low, TIBC normal, ferritin 60. Hgb dropped to 6.6 with initial fluid resuscitation and she received 2 U prbs with appropriate response. Update CBC

## 2013-11-26 NOTE — Assessment & Plan Note (Signed)
stage III (moderate): stable. Furosemide dose decreased. Update CMP

## 2013-11-26 NOTE — Progress Notes (Signed)
Patient ID: Jenny [name], female   DOB: 01/19/1927, 80 y.o.   MRN: [id number]   Code Status: DNR  Allergies  Allergen Reactions  . Levaquin [Levofloxacin In D5w] Hives  . Penicillins Rash    Chief Complaint  Patient presents with  . Medical Managment of Chronic Issues  . Hospitalization Follow-up    HPI: Patient is a 80 y.o. female seen in the SNF at [facility] today for evaluation of anxiety, resolved chest pain-s/p NTG,  and other chronic medical conditions. Hospitalized 11/15/13-11/20/13 for PNA.  She has a past medical history of hypertension, hypothyroidism, CAD, CHF who resides at assisted living presented to the ED after patient had been experiencing generalized fatigue over the past 2 weeks. Patient also complained of  chest pain which has been intermittent and not associated with shortness of breath. In the ED patient is found to have leukocytosis, portable chest x-ray showed possible left lower lobe infiltrate-Azithromycin to be completed  Problem List Items Addressed This Visit   Anemia     No overt bleed, though FOBT was positive. Iron levels low, TIBC normal, ferritin 60. Hgb dropped to 6.6 with initial fluid resuscitation and she received 2 U prbs with appropriate response. Update CBC    Anxiety     F/u Psychiatrist, currently on Zoloft and prn Clonazepam and Lamictal. off Abilify andTrazodone.       Chest pain     Resolved after Clonazepam and NTG--cardiac vs anxiety-EKG to evaluate further.     Chronic kidney disease, stage III (moderate) (Chronic)     stage III (moderate): stable. Furosemide dose decreased. Update CMP    Community acquired pneumonia - Primary     Chest x-ray consistent with a left lower lobe pneumonia. Patient noted to have a leukocytosis on admission. Urine strep pneumococcus antigen is negative. Urine Legionella antigen is negative. Patient was initially given IV Levaquin however developed a rash and this was discontinued. Transitioned to  IV azithromycin and IV Azactam. Blood cultures negative. Is discharged on Azithromycin 500 mg for 4 additional days to complete 10 d course.    Constipation     Stable, takes Senna.     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       Hyponatremia     She seems to have a chronic mild hyponatremia. During admission Na dropped from 131 to 125. She was evaluated by nephrology. Trazodone discontinued and zoloft dose decreased in attempt to improve sodium level. Update CMP     Hypothyroidism (Chronic)     Takes Levothyroxine 6mcg      Paroxysmal supraventricular tachycardia     PAF (paroxysmal atrial fibrillation)Hypothyroidism: Had A fib with RVR on 11/18/13 treated with cardizem drip. Converted to NSR brady at 40 and has remained fairly bradycardic in the 50's for over 24 hours. 2-D echo with a normal EF and no wall motion abnormalities. HR in 50s. Obtain EKG     Spinal stenosis of lumbar region (Chronic)     Managed with Naproxen.        Review of Systems:  Review of Systems  Constitutional: Negative for fever, chills, weight loss, malaise/fatigue and diaphoresis.  HENT: Positive for hearing loss. Negative for congestion, ear pain and sore throat.   Eyes: Negative for pain, discharge and redness.  Respiratory: Negative for cough, hemoptysis, sputum production, shortness of breath and wheezing.   Cardiovascular: Negative for chest pain, palpitations, orthopnea, claudication, leg  swelling and PND.  Gastrointestinal: Negative for heartburn, nausea, vomiting, abdominal pain, diarrhea, constipation and blood in stool.  Genitourinary: Positive for frequency. Negative for dysuria, urgency, hematuria and flank pain.  Musculoskeletal: Positive for joint pain. Negative for back pain, falls, myalgias and neck pain.  Skin: Negative for itching and rash.  Neurological: Negative for dizziness, tingling, tremors,  sensory change, speech change, focal weakness, seizures, loss of consciousness, weakness and headaches.  Endo/Heme/Allergies: Negative for environmental allergies and polydipsia. Does not bruise/bleed easily.  Psychiatric/Behavioral: Positive for depression and memory loss. Negative for hallucinations. The patient is nervous/anxious. The patient does not have insomnia.      Past Medical History  Diagnosis Date  . Hypertension   . Coronary artery disease   . Heart murmur   . Hypothyroidism 05-02-12    tx. Synthroid  . Headache(784.0) 05-02-12    past hx. migrianes-many yrs ago  . Arthritis 05-02-12    osteoarthritis-rt. hip  . Pneumonia 05-02-12    mild case  . 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 Acute upper respiratory infections of unspecified site 2014  . Unspecified venous (peripheral) insufficiency 2013  . Senile osteoporosis 2013  . Unspecified closed fracture of pelvis 10/13/2012  . Cellulitis and abscess of leg, except foot 2013  . Altered mental status 2013  . Chronic pain syndrome 2013  . Urinary tract infection, site not specified   . Hyposmolality and/or hyponatremia 05/2012  . Congestive heart failure, unspecified 05/2012  . Pneumonia, organism unspecified 05/2012  . Pressure ulcer, buttock(707.05) 05/2012  . Hypoxemia 05/2012  . Reflux esophagitis 3013  . Unspecified constipation 2013  . Acute posthemorrhagic anemia 05/2012  . Coronary atherosclerosis of native coronary artery 05/2012  . Pain in joint, pelvic region and thigh 04/2012  . Dysphagia, unspecified(787.20) 03/2012  . Benign neoplasm of adrenal gland 10/2011  . Alzheimer's disease 06/2011  . Other seborrheic keratosis 2012  . Hypertensive renal disease, benign 2012  . Pain in joint, lower leg 2012  . Memory loss 09/2010  . Spinal stenosis, lumbar region, without neurogenic claudication 2011  . Unspecified vitamin D deficiency 2010  . Unspecified disorder of kidney and ureter 2010  . Edema 2010  . Other malaise and  fatigue 2009  . Congenital factor VIII disorder 2009  . Senile dementia with delusional features 2008  . Generalized hyperhidrosis 2007  . Irritable bowel syndrome 2007  . Unspecified chronic bronchitis 2006  . Depression 06/2005     depression only,tx. oral meds  . Shortness of breath 2005  . Anxiety 2004  . Unspecified late effects of cerebrovascular disease 2004  . Migraine with aura, without mention of intractable migraine without mention of status migrainosus 2003  . Tinnitus 2003  . Other symptoms involving cardiovascular system 2003  . Hyperlipidemia 2002   Past Surgical History  Procedure Laterality Date  . Tonsillectomy    . Tubal ligation  05-02-12  .  tummy tuck  05-02-12    many yrs ago  . Cataract extraction, bilateral  05-02-12    bilateral  . Retinal detachment surgery  05-02-12    left  . Total hip arthroplasty  05/09/2012    Procedure: TOTAL HIP ARTHROPLASTY ANTERIOR APPROACH;  Surgeon: Mcarthur Rossetti, MD;  Location: [facility] ORS;  Service: Orthopedics;  Laterality: Right;  . Dental surgery  08/11/13    3 teeth surgical removed by Dr. Lewanda Rife   Social History:   reports that she quit smoking about 5 years ago. Her smoking use  included Cigarettes. She has a 62 pack-year smoking history. She has never used smokeless tobacco. She reports that she does not drink alcohol or use illicit drugs.  Family History  Problem Relation Age of Onset  . Diabetes Father     Medications: Patient's Medications  New Prescriptions   No medications on file  Previous Medications   ACETAMINOPHEN (TYLENOL) 325 MG TABLET    Take 325 mg by mouth daily as needed for mild pain. One daily as needed   ASCORBIC ACID (VITAMIN C) 1000 MG TABLET    Take 1,000 mg by mouth daily with breakfast.   ASPIRIN 325 MG TABLET    Take 1 tablet (325 mg total) by mouth daily.   BRIMONIDINE-TIMOLOL (COMBIGAN) 0.2-0.5 % OPHTHALMIC SOLUTION    Place 1 drop into both eyes every 12 (twelve) hours.     CALCITONIN, SALMON, (MIACALCIN/FORTICAL) 200 UNIT/ACT NASAL SPRAY    Place 1 spray into alternate nostrils daily. Use one spray in one nostril once daily. Alternate nostrils daily   CALCIUM-VITAMIN D (OSCAL WITH D) 500-200 MG-UNIT PER TABLET    Take 1 tablet by mouth 3 (three) times daily. Take one tablet three times daily for calcium supplementation   CLONAZEPAM (KLONOPIN) 0.5 MG TABLET    Take 0.25 mg by mouth 3 (three) times daily as needed. Take one three times a day   FUROSEMIDE (LASIX) 40 MG TABLET    Take 0.5 tablets (20 mg total) by mouth 2 (two) times daily. Take one tablet twice daily to prevent fluid retention.   LAMOTRIGINE (LAMICTAL) 200 MG TABLET    Take 200 mg by mouth daily.   LEVOTHYROXINE (SYNTHROID, LEVOTHROID) 88 MCG TABLET    Take 88 mcg by mouth daily before breakfast.   MULTIVITAMIN-LUTEIN (OCUVITE-LUTEIN) CAPS    Take 1 capsule by mouth daily.   NAPROXEN SODIUM (ANAPROX) 220 MG TABLET    Take 220 mg by mouth daily. Take one daily   POTASSIUM CHLORIDE SA (K-DUR,KLOR-CON) 20 MEQ TABLET    Take 20 mEq by mouth 2 (two) times daily.   SENNA (SENOKOT) 8.6 MG TABS    Take 2 tablets by mouth at bedtime.   SERTRALINE (ZOLOFT) 25 MG TABLET    Take 1 tablet (25 mg total) by mouth daily.  Modified Medications   No medications on file  Discontinued Medications   AZITHROMYCIN (ZITHROMAX) 500 MG TABLET    Take 1 tablet (500 mg total) by mouth daily.     Physical Exam: Physical Exam  Constitutional:  Elderly. Frail. Currently distressed with her husband's discharge from SNF where he roomed across hers.   HENT:   External auditory canals normal. Partial hearing loss bilaterally.  Eyes: EOM are normal. Pupils are equal, round, and reactive to light.  Wears corrective lenses.  Neck: Neck supple. No JVD present. No tracheal deviation present. No thyromegaly present.  Cardiovascular: Normal rate and normal heart sounds.  Exam reveals no gallop and no friction rub.   No murmur  heard. Pulmonary/Chest: Effort normal. No respiratory distress. She has no wheezes. She has rales. She exhibits no tenderness.  Abdominal: Soft. Bowel sounds are normal. She exhibits no distension and no mass. There is no tenderness.  Musculoskeletal: She exhibits no edema and no tenderness.  Limited range of motion of the right shoulder. Has pain with elevation of the arm or rotational movements of the shoulder.  Lymphadenopathy:    She has no cervical adenopathy.  Neurological:  Conversational, but there is a significant  memory deficit.  Skin:  Multiple senile ecchymoses, otherwise normal  Psychiatric: She has a normal mood and affect. Her behavior is normal.    Filed Vitals:   11/23/13 1654  BP: 130/80  Pulse: 70  Temp: 98.6 F (37 C)  TempSrc: Tympanic  Resp: 18      Labs reviewed: Basic Metabolic Panel:  Recent Labs  11/17/13 0408  11/19/13 0325 11/19/13 0515 11/20/13 0534  NA 125*  < > 126* 125* 125*  K 4.0  < > 4.2 4.0 3.6*  CL 87*  < > 86* 86* 88*  CO2 22  < > 24 22 23   GLUCOSE 131*  < > 111* 130* 109*  BUN 26*  < > 20 19 18   CREATININE 1.08  < > 1.02 0.97 0.96  CALCIUM 8.3*  < > 8.8 8.5 8.3*  PHOS  --   --   --   --  2.8  TSH 1.123  --   --   --   --   < > = values in this interval not displayed. Liver Function Tests:  Recent Labs  11/15/13 0500 11/16/13 0502 11/20/13 0534  AST 15 18  --   ALT 13 13  --   ALKPHOS 83 74  --   BILITOT <0.2* <0.2*  --   PROT 6.7 6.0  --   ALBUMIN 2.7* 2.2* 2.3*   CBC:  Recent Labs  06/15/13 1531 11/15/13 0500  11/19/13 0325 11/19/13 0515 11/20/13 0534  WBC 9.9 17.3*  < > 13.5* 12.5* 10.0  NEUTROABS 5.6 13.0*  --   --   --   --   HGB 11.8* 8.0*  < > 10.0* 9.8* 9.3*  HCT 35.7* 24.1*  < > 30.2* 30.2* 27.0*  MCV 83.1 70.5*  < > 75.1* 75.3* 74.4*  PLT 469.0* 552*  < > 494* 461* 427*  < > = values in this interval not displayed.  Past Procedures:  11/15/2013 CLINICAL DATA: Cough. Evaluate for pneumonia  versus edema. EXAM: CHEST - IMPRESSION: Persistent left lower lobe airspace disease suspicious for pneumonia. No significant pleural effusion.   11/15/2013 CLINICAL DATA: Chest pain EXAM: PORTABLE CHEST - IMPRESSION: Left lower lobe infiltrate, worrisome for pneumonia.   Assessment/Plan Community acquired pneumonia Chest x-ray consistent with a left lower lobe pneumonia. Patient noted to have a leukocytosis on admission. Urine strep pneumococcus antigen is negative. Urine Legionella antigen is negative. Patient was initially given IV Levaquin however developed a rash and this was discontinued. Transitioned to IV azithromycin and IV Azactam. Blood cultures negative. Is discharged on Azithromycin 500 mg for 4 additional days to complete 10 d course.  Paroxysmal supraventricular tachycardia PAF (paroxysmal atrial fibrillation)Hypothyroidism: Had A fib with RVR on 11/18/13 treated with cardizem drip. Converted to NSR brady at 40 and has remained fairly bradycardic in the 50's for over 24 hours. 2-D echo with a normal EF and no wall motion abnormalities. HR in 50s. Obtain EKG   Hyponatremia She seems to have a chronic mild hyponatremia. During admission Na dropped from 131 to 125. She was evaluated by nephrology. Trazodone discontinued and zoloft dose decreased in attempt to improve sodium level. Update CMP   Anemia No overt bleed, though FOBT was positive. Iron levels low, TIBC normal, ferritin 60. Hgb dropped to 6.6 with initial fluid resuscitation and she received 2 U prbs with appropriate response. Update CBC  Anxiety F/u Psychiatrist, currently on Zoloft and prn Clonazepam and Lamictal. off Abilify andTrazodone.  Chronic kidney disease, stage III (moderate) stage III (moderate): stable. Furosemide dose decreased. Update CMP  Dementia in conditions classified elsewhere without behavioral disturbance(294.10) Memory deficits noted.     Hypothyroidism Takes Levothyroxine  29mcg    Spinal stenosis of lumbar region Managed with Naproxen.   Constipation Stable, takes Senna.   Chest pain Resolved after Clonazepam and NTG--cardiac vs anxiety-EKG to evaluate further.     Family/ Staff Communication: observe the patient  Goals of Care: AL  Labs/tests ordered: CBC, CMP, EKG

## 2013-11-26 NOTE — Assessment & Plan Note (Signed)
Resolved after Clonazepam and NTG--cardiac vs anxiety-EKG to evaluate further.

## 2013-11-26 NOTE — Assessment & Plan Note (Addendum)
F/u Psychiatrist, currently on Zoloft and prn Clonazepam and Lamictal. off Abilify andTrazodone.

## 2013-11-26 NOTE — Assessment & Plan Note (Signed)
Chest x-ray consistent with a left lower lobe pneumonia. Patient noted to have a leukocytosis on admission. Urine strep pneumococcus antigen is negative. Urine Legionella antigen is negative. Patient was initially given IV Levaquin however developed a rash and this was discontinued. Transitioned to IV azithromycin and IV Azactam. Blood cultures negative. Is discharged on Azithromycin 500 mg for 4 additional days to complete 10 d course.

## 2013-11-26 NOTE — Assessment & Plan Note (Signed)
Stable, takes Senna 

## 2013-11-26 NOTE — Assessment & Plan Note (Addendum)
PAF (paroxysmal atrial fibrillation)Hypothyroidism: Had A fib with RVR on 11/18/13 treated with cardizem drip. Converted to NSR brady at 40 and has remained fairly bradycardic in the 50's for over 24 hours. 2-D echo with a normal EF and no wall motion abnormalities. HR in 50s. Obtain EKG

## 2013-11-26 NOTE — Assessment & Plan Note (Addendum)
Managed with Naproxen.  

## 2013-11-26 NOTE — Assessment & Plan Note (Signed)
Takes Levothyroxine 31mcg

## 2013-11-26 NOTE — Assessment & Plan Note (Signed)
She seems to have a chronic mild hyponatremia. During admission Na dropped from 131 to 125. She was evaluated by nephrology. Trazodone discontinued and zoloft dose decreased in attempt to improve sodium level. Update CMP

## 2013-11-30 ENCOUNTER — Encounter: Payer: Self-pay | Admitting: Nurse Practitioner

## 2013-11-30 ENCOUNTER — Non-Acute Institutional Stay (SKILLED_NURSING_FACILITY): Payer: Medicare Other | Admitting: Nurse Practitioner

## 2013-11-30 DIAGNOSIS — R609 Edema, unspecified: Secondary | ICD-10-CM

## 2013-11-30 DIAGNOSIS — R079 Chest pain, unspecified: Secondary | ICD-10-CM

## 2013-11-30 DIAGNOSIS — F411 Generalized anxiety disorder: Secondary | ICD-10-CM

## 2013-11-30 DIAGNOSIS — F419 Anxiety disorder, unspecified: Secondary | ICD-10-CM

## 2013-11-30 DIAGNOSIS — K5900 Constipation, unspecified: Secondary | ICD-10-CM

## 2013-11-30 DIAGNOSIS — E871 Hypo-osmolality and hyponatremia: Secondary | ICD-10-CM

## 2013-11-30 DIAGNOSIS — D649 Anemia, unspecified: Secondary | ICD-10-CM

## 2013-11-30 DIAGNOSIS — E039 Hypothyroidism, unspecified: Secondary | ICD-10-CM

## 2013-11-30 DIAGNOSIS — J189 Pneumonia, unspecified organism: Secondary | ICD-10-CM

## 2013-11-30 DIAGNOSIS — I2510 Atherosclerotic heart disease of native coronary artery without angina pectoris: Secondary | ICD-10-CM

## 2013-11-30 NOTE — Assessment & Plan Note (Addendum)
Takes Levothyroxine 88mcg, update TSH 

## 2013-11-30 NOTE — Assessment & Plan Note (Signed)
Chest x-ray consistent with a left lower lobe pneumonia. Fully treated and clinically healed.

## 2013-11-30 NOTE — Assessment & Plan Note (Signed)
Resolved after Clonazepam and NTG--cardiac vs anxiety-EKG 11/26/13 SR, vent rate 74, no ST elevation or depression. No further c/o chest pain.

## 2013-11-30 NOTE — Assessment & Plan Note (Signed)
Has prn NTG available to her.

## 2013-11-30 NOTE — Assessment & Plan Note (Addendum)
She seems to have a chronic mild hyponatremia. During admission Na dropped from 131 to 125. She was evaluated by nephrology. Trazodone discontinued and zoloft dose decreased in attempt to improve sodium level. Updated serum Na was 134 11/26/13. Continue with Furosemide 20mg  bid. Dc fluid restriction. F/u BMP

## 2013-11-30 NOTE — Assessment & Plan Note (Addendum)
No overt bleed, though FOBT was positive. Iron levels low, TIBC normal, ferritin 60. Hgb dropped to 6.6 with initial fluid resuscitation and she received 2 U prbs with appropriate response. Hgb 9.4 with lower MCV76.6, MCH 24,4-will supplement with Fe 325mg  daily, check CBC, Iron, B12, Folate in one week. Change Naproxen to prn. Start Omeprazole 20mg  daily for GI protection.

## 2013-11-30 NOTE — Assessment & Plan Note (Signed)
F/u Psychiatrist, currently on Zoloft and prn Clonazepam and Lamictal. off Abilify andTrazodone. Stable.

## 2013-11-30 NOTE — Assessment & Plan Note (Signed)
Stable, takes Senna I qhs.    

## 2013-11-30 NOTE — Assessment & Plan Note (Addendum)
Trace only. Continue Furosemide 20mg bid and Kcl 20meq bid.   

## 2013-11-30 NOTE — Progress Notes (Signed)
Patient ID: Jenny [name], female   DOB: Nov 30, 1926, 80 y.o.   MRN: [id number]   Code Status: DNR  Allergies  Allergen Reactions  . Levaquin [Levofloxacin In D5w] Hives  . Penicillins Rash    Chief Complaint  Patient presents with  . Medical Managment of Chronic Issues    anemia  . Acute Visit    HPI: Patient is a 80 y.o. female seen in the SNF at [facility] today for evaluation of anemia, hyponatremia, anxiety, resolved chest pain-s/p NTG,  and other chronic medical conditions. Hospitalized 11/15/13-11/20/13 for PNA.    Problem List Items Addressed This Visit   Anemia - Primary     No overt bleed, though FOBT was positive. Iron levels low, TIBC normal, ferritin 60. Hgb dropped to 6.6 with initial fluid resuscitation and she received 2 U prbs with appropriate response. Hgb 9.4 with lower MCV76.6, MCH 24,4-will supplement with Fe 325mg  daily, check CBC, Iron, B12, Folate in one week. Change Naproxen to prn. Start Omeprazole 20mg  daily for GI protection.       Anxiety     F/u Psychiatrist, currently on Zoloft and prn Clonazepam and Lamictal. off Abilify andTrazodone. Stable.         CAD (coronary artery disease)     Has prn NTG available to her.     Chest pain     Resolved after Clonazepam and NTG--cardiac vs anxiety-EKG 11/26/13 SR, vent rate 74, no ST elevation or depression. No further c/o chest pain.       Community acquired pneumonia     Chest x-ray consistent with a left lower lobe pneumonia. Fully treated and clinically healed.       Constipation     Stable, takes Senna I qhs.      Edema (Chronic)     Trace only. Continue Furosemide 20mg  bid and Kcl 4meq bid.     Hyponatremia     She seems to have a chronic mild hyponatremia. During admission Na dropped from 131 to 125. She was evaluated by nephrology. Trazodone discontinued and zoloft dose decreased in attempt to improve sodium level. Updated serum Na was 134 11/26/13. Continue with Furosemide 20mg   bid. Dc fluid restriction. F/u BMP      Hypothyroidism (Chronic)     Takes Levothyroxine 61mcg, update TSH           Review of Systems:  Review of Systems  Constitutional: Negative for fever, chills, weight loss, malaise/fatigue and diaphoresis.  HENT: Positive for hearing loss. Negative for congestion, ear pain and sore throat.   Eyes: Negative for pain, discharge and redness.  Respiratory: Negative for cough, hemoptysis, sputum production, shortness of breath and wheezing.   Cardiovascular: Negative for chest pain, palpitations, orthopnea, claudication, leg swelling and PND.  Gastrointestinal: Negative for heartburn, nausea, vomiting, abdominal pain, diarrhea, constipation and blood in stool.  Genitourinary: Positive for frequency. Negative for dysuria, urgency, hematuria and flank pain.  Musculoskeletal: Positive for joint pain. Negative for back pain, falls, myalgias and neck pain.  Skin: Negative for itching and rash.  Neurological: Negative for dizziness, tingling, tremors, sensory change, speech change, focal weakness, seizures, loss of consciousness, weakness and headaches.  Endo/Heme/Allergies: Negative for environmental allergies and polydipsia. Does not bruise/bleed easily.  Psychiatric/Behavioral: Positive for depression and memory loss. Negative for hallucinations. The patient is nervous/anxious. The patient does not have insomnia.      Past Medical History  Diagnosis Date  . Hypertension   . Coronary artery disease   .  Heart murmur   . Hypothyroidism 05-02-12    tx. Synthroid  . Headache(784.0) 05-02-12    past hx. migrianes-many yrs ago  . Arthritis 05-02-12    osteoarthritis-rt. hip  . Pneumonia 05-02-12    mild case  . 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 Acute upper respiratory infections of unspecified site 2014  . Unspecified venous (peripheral) insufficiency 2013  . Senile osteoporosis 2013  . Unspecified closed fracture of pelvis 10/13/2012  . Cellulitis  and abscess of leg, except foot 2013  . Altered mental status 2013  . Chronic pain syndrome 2013  . Urinary tract infection, site not specified   . Hyposmolality and/or hyponatremia 05/2012  . Congestive heart failure, unspecified 05/2012  . Pneumonia, organism unspecified 05/2012  . Pressure ulcer, buttock(707.05) 05/2012  . Hypoxemia 05/2012  . Reflux esophagitis 3013  . Unspecified constipation 2013  . Acute posthemorrhagic anemia 05/2012  . Coronary atherosclerosis of native coronary artery 05/2012  . Pain in joint, pelvic region and thigh 04/2012  . Dysphagia, unspecified(787.20) 03/2012  . Benign neoplasm of adrenal gland 10/2011  . Alzheimer's disease 06/2011  . Other seborrheic keratosis 2012  . Hypertensive renal disease, benign 2012  . Pain in joint, lower leg 2012  . Memory loss 09/2010  . Spinal stenosis, lumbar region, without neurogenic claudication 2011  . Unspecified vitamin D deficiency 2010  . Unspecified disorder of kidney and ureter 2010  . Edema 2010  . Other malaise and fatigue 2009  . Congenital factor VIII disorder 2009  . Senile dementia with delusional features 2008  . Generalized hyperhidrosis 2007  . Irritable bowel syndrome 2007  . Unspecified chronic bronchitis 2006  . Depression 06/2005     depression only,tx. oral meds  . Shortness of breath 2005  . Anxiety 2004  . Unspecified late effects of cerebrovascular disease 2004  . Migraine with aura, without mention of intractable migraine without mention of status migrainosus 2003  . Tinnitus 2003  . Other symptoms involving cardiovascular system 2003  . Hyperlipidemia 2002   Past Surgical History  Procedure Laterality Date  . Tonsillectomy    . Tubal ligation  05-02-12  .  tummy tuck  05-02-12    many yrs ago  . Cataract extraction, bilateral  05-02-12    bilateral  . Retinal detachment surgery  05-02-12    left  . Total hip arthroplasty  05/09/2012    Procedure: TOTAL HIP ARTHROPLASTY ANTERIOR APPROACH;   Surgeon: Mcarthur Rossetti, MD;  Location: [facility]S;  Service: Orthopedics;  Laterality: Right;  . Dental surgery  08/11/13    3 teeth surgical removed by Dr. Lewanda Rife   Social History:   reports that she quit smoking about 5 years ago. Her smoking use included Cigarettes. She has a 62 pack-year smoking history. She has never used smokeless tobacco. She reports that she does not drink alcohol or use illicit drugs.  Family History  Problem Relation Age of Onset  . Diabetes Father     Medications: Patient's Medications  New Prescriptions   No medications on file  Previous Medications   ACETAMINOPHEN (TYLENOL) 325 MG TABLET    Take 325 mg by mouth daily as needed for mild pain. One daily as needed   ASCORBIC ACID (VITAMIN C) 1000 MG TABLET    Take 1,000 mg by mouth daily with breakfast.   ASPIRIN 325 MG TABLET    Take 1 tablet (325 mg total) by mouth daily.   BRIMONIDINE-TIMOLOL (COMBIGAN) 0.2-0.5 % OPHTHALMIC  SOLUTION    Place 1 drop into both eyes every 12 (twelve) hours.    CALCITONIN, SALMON, (MIACALCIN/FORTICAL) 200 UNIT/ACT NASAL SPRAY    Place 1 spray into alternate nostrils daily. Use one spray in one nostril once daily. Alternate nostrils daily   CALCIUM-VITAMIN D (OSCAL WITH D) 500-200 MG-UNIT PER TABLET    Take 1 tablet by mouth 3 (three) times daily. Take one tablet three times daily for calcium supplementation   CLONAZEPAM (KLONOPIN) 0.5 MG TABLET    Take 0.25 mg by mouth 3 (three) times daily as needed. Take one three times a day   FUROSEMIDE (LASIX) 40 MG TABLET    Take 0.5 tablets (20 mg total) by mouth 2 (two) times daily. Take one tablet twice daily to prevent fluid retention.   LAMOTRIGINE (LAMICTAL) 200 MG TABLET    Take 200 mg by mouth daily.   LEVOTHYROXINE (SYNTHROID, LEVOTHROID) 88 MCG TABLET    Take 88 mcg by mouth daily before breakfast.   MULTIVITAMIN-LUTEIN (OCUVITE-LUTEIN) CAPS    Take 1 capsule by mouth daily.   NAPROXEN SODIUM (ANAPROX) 220 MG TABLET    Take  220 mg by mouth daily. Take one daily   POTASSIUM CHLORIDE SA (K-DUR,KLOR-CON) 20 MEQ TABLET    Take 20 mEq by mouth 2 (two) times daily.   SENNA (SENOKOT) 8.6 MG TABS    Take 2 tablets by mouth at bedtime.   SERTRALINE (ZOLOFT) 25 MG TABLET    Take 1 tablet (25 mg total) by mouth daily.  Modified Medications   No medications on file  Discontinued Medications   No medications on file     Physical Exam: Physical Exam  Constitutional:  Elderly. Frail. Currently distressed with her husband's discharge from SNF where he roomed across hers.   HENT:   External auditory canals normal. Partial hearing loss bilaterally.  Eyes: EOM are normal. Pupils are equal, round, and reactive to light.  Wears corrective lenses.  Neck: Neck supple. No JVD present. No tracheal deviation present. No thyromegaly present.  Cardiovascular: Normal rate and normal heart sounds.  Exam reveals no gallop and no friction rub.   No murmur heard. Pulmonary/Chest: Effort normal. No respiratory distress. She has no wheezes. She has rales. She exhibits no tenderness.  Abdominal: Soft. Bowel sounds are normal. She exhibits no distension and no mass. There is no tenderness.  Musculoskeletal: She exhibits no edema and no tenderness.  Limited range of motion of the right shoulder. Has pain with elevation of the arm or rotational movements of the shoulder.  Lymphadenopathy:    She has no cervical adenopathy.  Neurological:  Conversational, but there is a significant memory deficit.  Skin:  Multiple senile ecchymoses, otherwise normal  Psychiatric: She has a normal mood and affect. Her behavior is normal.    Filed Vitals:   11/30/13 1602  BP: 150/78  Pulse: 76  Temp: 98.2 F (36.8 C)  TempSrc: Tympanic  Resp: 18      Labs reviewed: Basic Metabolic Panel:  Recent Labs  11/17/13 0408  11/19/13 0325 11/19/13 0515 11/20/13 0534 11/26/13  NA 125*  < > 126* 125* 125* 134*  K 4.0  < > 4.2 4.0 3.6* 4.9  CL 87*   < > 86* 86* 88*  --   CO2 22  < > 24 22 23   --   GLUCOSE 131*  < > 111* 130* 109*  --   BUN 26*  < > 20 19 18 12   CREATININE  1.08  < > 1.02 0.97 0.96 1.1  CALCIUM 8.3*  < > 8.8 8.5 8.3*  --   PHOS  --   --   --   --  2.8  --   TSH 1.123  --   --   --   --   --   < > = values in this interval not displayed. Liver Function Tests:  Recent Labs  11/15/13 0500 11/16/13 0502 11/20/13 0534  AST 15 18  --   ALT 13 13  --   ALKPHOS 83 74  --   BILITOT <0.2* <0.2*  --   PROT 6.7 6.0  --   ALBUMIN 2.7* 2.2* 2.3*   CBC:  Recent Labs  06/15/13 1531 11/15/13 0500  11/19/13 0325 11/19/13 0515 11/20/13 0534 11/26/13  WBC 9.9 17.3*  < > 13.5* 12.5* 10.0 7.4  NEUTROABS 5.6 13.0*  --   --   --   --   --   HGB 11.8* 8.0*  < > 10.0* 9.8* 9.3* 9.4*  HCT 35.7* 24.1*  < > 30.2* 30.2* 27.0* 30*  MCV 83.1 70.5*  < > 75.1* 75.3* 74.4*  --   PLT 469.0* 552*  < > 494* 461* 427* 544*  < > = values in this interval not displayed.  Past Procedures:  11/15/2013 CLINICAL DATA: Cough. Evaluate for pneumonia versus edema. EXAM: CHEST - IMPRESSION: Persistent left lower lobe airspace disease suspicious for pneumonia. No significant pleural effusion.   11/15/2013 CLINICAL DATA: Chest pain EXAM: PORTABLE CHEST - IMPRESSION: Left lower lobe infiltrate, worrisome for pneumonia.   Assessment/Plan Anemia No overt bleed, though FOBT was positive. Iron levels low, TIBC normal, ferritin 60. Hgb dropped to 6.6 with initial fluid resuscitation and she received 2 U prbs with appropriate response. Hgb 9.4 with lower MCV76.6, MCH 24,4-will supplement with Fe 325mg  daily, check CBC, Iron, B12, Folate in one week. Change Naproxen to prn. Start Omeprazole 20mg  daily for GI protection.     Anxiety F/u Psychiatrist, currently on Zoloft and prn Clonazepam and Lamictal. off Abilify andTrazodone. Stable.       Chest pain Resolved after Clonazepam and NTG--cardiac vs anxiety-EKG 11/26/13 SR, vent rate 74, no ST elevation  or depression. No further c/o chest pain.     Edema Trace only. Continue Furosemide 20mg  bid and Kcl 57meq bid.   Hypothyroidism Takes Levothyroxine 100mcg, update TSH      Hyponatremia She seems to have a chronic mild hyponatremia. During admission Na dropped from 131 to 125. She was evaluated by nephrology. Trazodone discontinued and zoloft dose decreased in attempt to improve sodium level. Updated serum Na was 134 11/26/13. Continue with Furosemide 20mg  bid. Dc fluid restriction. F/u BMP    Constipation Stable, takes Senna I qhs.    CAD (coronary artery disease) Has prn NTG available to her.   Community acquired pneumonia Chest x-ray consistent with a left lower lobe pneumonia. Fully treated and clinically healed.       Family/ Staff Communication: observe the patient  Goals of Care: AL  Labs/tests ordered: CBC and BMP in one week.   Total time >35 minutes, >50% of time with the patient.

## 2013-12-09 ENCOUNTER — Ambulatory Visit: Payer: Medicare Other | Admitting: Neurology

## 2014-01-14 ENCOUNTER — Non-Acute Institutional Stay: Payer: Medicare Other | Admitting: Nurse Practitioner

## 2014-01-14 ENCOUNTER — Encounter: Payer: Self-pay | Admitting: Nurse Practitioner

## 2014-01-14 DIAGNOSIS — E039 Hypothyroidism, unspecified: Secondary | ICD-10-CM

## 2014-01-14 DIAGNOSIS — I129 Hypertensive chronic kidney disease with stage 1 through stage 4 chronic kidney disease, or unspecified chronic kidney disease: Secondary | ICD-10-CM

## 2014-01-14 DIAGNOSIS — N189 Chronic kidney disease, unspecified: Secondary | ICD-10-CM

## 2014-01-14 DIAGNOSIS — F0280 Dementia in other diseases classified elsewhere without behavioral disturbance: Secondary | ICD-10-CM

## 2014-01-14 DIAGNOSIS — J42 Unspecified chronic bronchitis: Secondary | ICD-10-CM

## 2014-01-14 DIAGNOSIS — F419 Anxiety disorder, unspecified: Secondary | ICD-10-CM

## 2014-01-14 DIAGNOSIS — R609 Edema, unspecified: Secondary | ICD-10-CM

## 2014-01-14 DIAGNOSIS — I2510 Atherosclerotic heart disease of native coronary artery without angina pectoris: Secondary | ICD-10-CM

## 2014-01-14 DIAGNOSIS — D649 Anemia, unspecified: Secondary | ICD-10-CM

## 2014-01-14 DIAGNOSIS — F411 Generalized anxiety disorder: Secondary | ICD-10-CM

## 2014-01-14 DIAGNOSIS — K5900 Constipation, unspecified: Secondary | ICD-10-CM

## 2014-01-14 NOTE — Assessment & Plan Note (Signed)
Controlled, takes Furosemide 20mg  bid, update CMP

## 2014-01-14 NOTE — Assessment & Plan Note (Signed)
Trace only. Continue Furosemide 20mg  bid and Kcl 13meq bid.

## 2014-01-14 NOTE — Assessment & Plan Note (Signed)
No overt bleed, though FOBT was positive. Iron levels low, TIBC normal, ferritin 60. Hgb dropped to 6.6 with initial fluid resuscitation and she received 2 U prbs with appropriate response. Hgb 9.4 with lower MCV76.6, MCH 24,4-takes  Fe 325mg  daily-update CBC. Takes Omeprazole 20mg  daily for GI protection.

## 2014-01-14 NOTE — Progress Notes (Signed)
Patient ID: Jenny [name], female   DOB: [birth year], 80 y.o.   MRN: [id number]   Code Status: Full Code MOST  Allergies  Allergen Reactions  . Levaquin [Levofloxacin In D5w] Hives  . Penicillins Rash    Chief Complaint  Patient presents with  . Medical Managment of Chronic Issues  . Acute Visit  . Anxiety    HPI: Patient is a 80 y.o. female seen in the AL at [facility] today for evaluation of anemia, hyponatremia, anxiety, resolved chest pain-s/p NTG,  and other chronic medical conditions.   Hospitalized 11/15/13-11/20/13 for PNA.    Problem List Items Addressed This Visit   Hypothyroidism (Chronic)     akes Levothyroxine 59mcg, TSH 0.583 12/01/13      Hypertensive renal disease, benign     Controlled, takes Furosemide 20mg  bid, update CMP    Edema (Chronic)     Trace only. Continue Furosemide 20mg  bid and Kcl 3meq bid.      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 Obtain MMSE      Constipation     Stable, takes Senna I qhs.       CAD (coronary artery disease)     Has prn NTG available to her. No chest pain since last visit.      Bronchitis, chronic     Stable.     Anxiety - Primary     F/u Psychiatrist, currently on Zoloft 25mg  and Clonazepam 0.25mg  tid, 0.5mg  qhs, 0.25mg  q4hr prn, Lamictal 200mg  daily, off  Abilify andTrazodone. Stable.       Anemia     No overt bleed, though FOBT was positive. Iron levels low, TIBC normal, ferritin 60. Hgb dropped to 6.6 with initial fluid resuscitation and she received 2 U prbs with appropriate response. Hgb 9.4 with lower MCV76.6, MCH 24,4-takes  Fe 325mg  daily-update CBC. Takes Omeprazole 20mg  daily for GI protection.          Review of Systems:  Review of Systems  Constitutional: Negative for fever, chills, weight loss, malaise/fatigue and diaphoresis.  HENT: Positive for hearing loss. Negative for congestion, ear pain and  sore throat.   Eyes: Negative for pain, discharge and redness.  Respiratory: Negative for cough, hemoptysis, sputum production, shortness of breath and wheezing.   Cardiovascular: Positive for leg swelling. Negative for chest pain, palpitations, orthopnea, claudication and PND.       Trace BLE  Gastrointestinal: Negative for heartburn, nausea, vomiting, abdominal pain, diarrhea, constipation and blood in stool.  Genitourinary: Positive for frequency. Negative for dysuria, urgency, hematuria and flank pain.  Musculoskeletal: Positive for joint pain. Negative for back pain, falls, myalgias and neck pain.  Skin: Negative for itching and rash.  Neurological: Negative for dizziness, tingling, tremors, sensory change, speech change, focal weakness, seizures, loss of consciousness, weakness and headaches.  Endo/Heme/Allergies: Negative for environmental allergies and polydipsia. Does not bruise/bleed easily.  Psychiatric/Behavioral: Positive for depression and memory loss. Negative for hallucinations. The patient is nervous/anxious. The patient does not have insomnia.      Past Medical History  Diagnosis Date  . Hypertension   . Coronary artery disease   . Heart murmur   . Hypothyroidism 05-02-12    tx. Synthroid  . Headache(784.0) 05-02-12    past hx. migrianes-many yrs ago  . Arthritis 05-02-12    osteoarthritis-rt. hip  . Pneumonia 05-02-12    mild case  . 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 Acute  upper respiratory infections of unspecified site 2014  . Unspecified venous (peripheral) insufficiency 2013  . Senile osteoporosis 2013  . Unspecified closed fracture of pelvis 10/13/2012  . Cellulitis and abscess of leg, except foot 2013  . Altered mental status 2013  . Chronic pain syndrome 2013  . Urinary tract infection, site not specified   . Hyposmolality and/or hyponatremia 05/2012  . Congestive heart failure, unspecified 05/2012  . Pneumonia, organism unspecified 05/2012  . Pressure ulcer,  buttock(707.05) 05/2012  . Hypoxemia 05/2012  . Reflux esophagitis 3013  . Unspecified constipation 2013  . Acute posthemorrhagic anemia 05/2012  . Coronary atherosclerosis of native coronary artery 05/2012  . Pain in joint, pelvic region and thigh 04/2012  . Dysphagia, unspecified(787.20) 03/2012  . Benign neoplasm of adrenal gland 10/2011  . Alzheimer's disease 06/2011  . Other seborrheic keratosis 2012  . Hypertensive renal disease, benign 2012  . Pain in joint, lower leg 2012  . Memory loss 09/2010  . Spinal stenosis, lumbar region, without neurogenic claudication 2011  . Unspecified vitamin D deficiency 2010  . Unspecified disorder of kidney and ureter 2010  . Edema 2010  . Other malaise and fatigue 2009  . Congenital factor VIII disorder 2009  . Senile dementia with delusional features 2008  . Generalized hyperhidrosis 2007  . Irritable bowel syndrome 2007  . Unspecified chronic bronchitis 2006  . Depression 06/2005     depression only,tx. oral meds  . Shortness of breath 2005  . Anxiety 2004  . Unspecified late effects of cerebrovascular disease 2004  . Migraine with aura, without mention of intractable migraine without mention of status migrainosus 2003  . Tinnitus 2003  . Other symptoms involving cardiovascular system 2003  . Hyperlipidemia 2002   Past Surgical History  Procedure Laterality Date  . Tonsillectomy    . Tubal ligation  05-02-12  .  tummy tuck  05-02-12    many yrs ago  . Cataract extraction, bilateral  05-02-12    bilateral  . Retinal detachment surgery  05-02-12    left  . Total hip arthroplasty  05/09/2012    Procedure: TOTAL HIP ARTHROPLASTY ANTERIOR APPROACH;  Surgeon: Mcarthur Rossetti, MD;  Location: [facility];  Service: Orthopedics;  Laterality: Right;  . Dental surgery  08/11/13    3 teeth surgical removed by Dr. Lewanda Rife   Social History:   reports that she quit smoking about 5 years ago. Her smoking use included Cigarettes. She has a 62 pack-year  smoking history. She has never used smokeless tobacco. She reports that she does not drink alcohol or use illicit drugs.  Family History  Problem Relation Age of Onset  . Diabetes Father     Medications: Patient's Medications  New Prescriptions   No medications on file  Previous Medications   ACETAMINOPHEN (TYLENOL) 325 MG TABLET    Take 325 mg by mouth daily as needed for mild pain. One daily as needed   ASCORBIC ACID (VITAMIN C) 1000 MG TABLET    Take 1,000 mg by mouth daily with breakfast.   ASPIRIN 325 MG TABLET    Take 1 tablet (325 mg total) by mouth daily.   BRIMONIDINE-TIMOLOL (COMBIGAN) 0.2-0.5 % OPHTHALMIC SOLUTION    Place 1 drop into both eyes every 12 (twelve) hours.    CALCITONIN, SALMON, (MIACALCIN/FORTICAL) 200 UNIT/ACT NASAL SPRAY    Place 1 spray into alternate nostrils daily. Use one spray in one nostril once daily. Alternate nostrils daily   CALCIUM-VITAMIN D (OSCAL  WITH D) 500-200 MG-UNIT PER TABLET    Take 1 tablet by mouth 3 (three) times daily. Take one tablet three times daily for calcium supplementation   CLONAZEPAM (KLONOPIN) 0.5 MG TABLET    Take 0.25 mg by mouth 3 (three) times daily. 0.5mg  qhs and 0.25mg  q4h prn   FUROSEMIDE (LASIX) 40 MG TABLET    Take 0.5 tablets (20 mg total) by mouth 2 (two) times daily. Take one tablet twice daily to prevent fluid retention.   LAMOTRIGINE (LAMICTAL) 200 MG TABLET    Take 200 mg by mouth daily.   LEVOTHYROXINE (SYNTHROID, LEVOTHROID) 88 MCG TABLET    Take 88 mcg by mouth daily before breakfast.   MULTIVITAMIN-LUTEIN (OCUVITE-LUTEIN) CAPS    Take 1 capsule by mouth daily.   NAPROXEN SODIUM (ANAPROX) 220 MG TABLET    Take 220 mg by mouth daily. Take one daily   POTASSIUM CHLORIDE SA (K-DUR,KLOR-CON) 20 MEQ TABLET    Take 20 mEq by mouth 2 (two) times daily.   SENNA (SENOKOT) 8.6 MG TABS    Take 2 tablets by mouth at bedtime.   SERTRALINE (ZOLOFT) 25 MG TABLET    Take 1 tablet (25 mg total) by mouth daily.  Modified  Medications   No medications on file  Discontinued Medications   No medications on file     Physical Exam: Physical Exam  Constitutional:  Elderly. Frail. Currently distressed with her husband's discharge from SNF where he roomed across hers.   HENT:   External auditory canals normal. Partial hearing loss bilaterally.  Eyes: EOM are normal. Pupils are equal, round, and reactive to light.  Wears corrective lenses.  Neck: Neck supple. No JVD present. No tracheal deviation present. No thyromegaly present.  Cardiovascular: Normal rate and normal heart sounds.  Exam reveals no gallop and no friction rub.   No murmur heard. Pulmonary/Chest: Effort normal. No respiratory distress. She has no wheezes. She has rales. She exhibits no tenderness.  Abdominal: Soft. Bowel sounds are normal. She exhibits no distension and no mass. There is no tenderness.  Musculoskeletal: She exhibits edema. She exhibits no tenderness.  Limited range of motion of the right shoulder. Trace edema BLE  Lymphadenopathy:    She has no cervical adenopathy.  Neurological:  Conversational, but there is a significant memory deficit.  Skin:  Multiple senile ecchymoses, otherwise normal  Psychiatric: She has a normal mood and affect. Her behavior is normal.    Filed Vitals:   01/14/14 1625  BP: 130/70  Pulse: 70  Temp: 98 F (36.7 C)  TempSrc: Tympanic  Resp: 20      Labs reviewed: Basic Metabolic Panel:  Recent Labs  11/17/13 0408  11/19/13 0325 11/19/13 0515 11/20/13 0534 11/26/13  NA 125*  < > 126* 125* 125* 134*  K 4.0  < > 4.2 4.0 3.6* 4.9  CL 87*  < > 86* 86* 88*  --   CO2 22  < > 24 22 23   --   GLUCOSE 131*  < > 111* 130* 109*  --   BUN 26*  < > 20 19 18 12   CREATININE 1.08  < > 1.02 0.97 0.96 1.1  CALCIUM 8.3*  < > 8.8 8.5 8.3*  --   PHOS  --   --   --   --  2.8  --   TSH 1.123  --   --   --   --   --   < > = values in this  interval not displayed. Liver Function Tests:  Recent Labs   11/15/13 0500 11/16/13 0502 11/20/13 0534  AST 15 18  --   ALT 13 13  --   ALKPHOS 83 74  --   BILITOT <0.2* <0.2*  --   PROT 6.7 6.0  --   ALBUMIN 2.7* 2.2* 2.3*   CBC:  Recent Labs  06/15/13 1531 11/15/13 0500  11/19/13 0325 11/19/13 0515 11/20/13 0534 11/26/13  WBC 9.9 17.3*  < > 13.5* 12.5* 10.0 7.4  NEUTROABS 5.6 13.0*  --   --   --   --   --   HGB 11.8* 8.0*  < > 10.0* 9.8* 9.3* 9.4*  HCT 35.7* 24.1*  < > 30.2* 30.2* 27.0* 30*  MCV 83.1 70.5*  < > 75.1* 75.3* 74.4*  --   PLT 469.0* 552*  < > 494* 461* 427* 544*  < > = values in this interval not displayed.  Past Procedures:  11/15/2013 CLINICAL DATA: Cough. Evaluate for pneumonia versus edema. EXAM: CHEST - IMPRESSION: Persistent left lower lobe airspace disease suspicious for pneumonia. No significant pleural effusion.   11/15/2013 CLINICAL DATA: Chest pain EXAM: PORTABLE CHEST - IMPRESSION: Left lower lobe infiltrate, worrisome for pneumonia.   Assessment/Plan Anxiety F/u Psychiatrist, currently on Zoloft 25mg  and Clonazepam 0.25mg  tid, 0.5mg  qhs, 0.25mg  q4hr prn, Lamictal 200mg  daily, off  Abilify andTrazodone. Stable.     Anemia No overt bleed, though FOBT was positive. Iron levels low, TIBC normal, ferritin 60. Hgb dropped to 6.6 with initial fluid resuscitation and she received 2 U prbs with appropriate response. Hgb 9.4 with lower MCV76.6, MCH 24,4-takes  Fe 325mg  daily-update CBC. Takes Omeprazole 20mg  daily for GI protection.     Hypothyroidism akes Levothyroxine 25mcg, TSH 0.583 12/01/13    Hypertensive renal disease, benign Controlled, takes Furosemide 20mg  bid, update CMP  Constipation Stable, takes Senna I qhs.     Bronchitis, chronic Stable.   CAD (coronary artery disease) Has prn NTG available to her. No chest pain since last visit.    Dementia in conditions classified elsewhere without behavioral disturbance(294.10) Memory deficits noted. Obtain MMSE    Edema Trace only. Continue  Furosemide 20mg  bid and Kcl 75meq bid.      Family/ Staff Communication: observe the patient  Goals of Care: AL  Labs/tests ordered:  CBC, CMP, MMSE

## 2014-01-14 NOTE — Assessment & Plan Note (Signed)
Memory deficits noted. Obtain MMSE

## 2014-01-14 NOTE — Assessment & Plan Note (Signed)
Stable, takes Senna I qhs.    

## 2014-01-14 NOTE — Assessment & Plan Note (Signed)
Stable

## 2014-01-14 NOTE — Assessment & Plan Note (Signed)
F/u Psychiatrist, currently on Zoloft 25mg  and Clonazepam 0.25mg  tid, 0.5mg  qhs, 0.25mg  q4hr prn, Lamictal 200mg  daily, off  Abilify andTrazodone. Stable.

## 2014-01-14 NOTE — Assessment & Plan Note (Signed)
akes Levothyroxine 29mcg, TSH 0.583 12/01/13

## 2014-01-14 NOTE — Assessment & Plan Note (Signed)
Has prn NTG available to her. No chest pain since last visit.

## 2014-01-19 LAB — HEPATIC FUNCTION PANEL
ALT: 11 U/L (ref 7–35)
AST: 18 U/L (ref 13–35)
Alkaline Phosphatase: 67 U/L (ref 25–125)
BILIRUBIN, TOTAL: 0.3 mg/dL

## 2014-01-19 LAB — BASIC METABOLIC PANEL
BUN: 15 mg/dL (ref 4–21)
Creatinine: 1 mg/dL (ref 0.5–1.1)
Glucose: 101 mg/dL
Potassium: 4.6 mmol/L (ref 3.4–5.3)
SODIUM: 136 mmol/L — AB (ref 137–147)

## 2014-01-19 LAB — CBC AND DIFFERENTIAL
HEMATOCRIT: 34 % — AB (ref 36–46)
HEMOGLOBIN: 10.8 g/dL — AB (ref 12.0–16.0)
Platelets: 517 10*3/uL — AB (ref 150–399)
WBC: 5.8 10^3/mL

## 2014-01-28 ENCOUNTER — Encounter: Payer: Self-pay | Admitting: Nurse Practitioner

## 2014-01-28 ENCOUNTER — Non-Acute Institutional Stay: Payer: Medicare Other | Admitting: Nurse Practitioner

## 2014-01-28 DIAGNOSIS — D649 Anemia, unspecified: Secondary | ICD-10-CM

## 2014-01-28 DIAGNOSIS — K5900 Constipation, unspecified: Secondary | ICD-10-CM

## 2014-01-28 DIAGNOSIS — R5383 Other fatigue: Secondary | ICD-10-CM

## 2014-01-28 DIAGNOSIS — R5381 Other malaise: Secondary | ICD-10-CM

## 2014-01-28 DIAGNOSIS — K219 Gastro-esophageal reflux disease without esophagitis: Secondary | ICD-10-CM | POA: Insufficient documentation

## 2014-01-28 DIAGNOSIS — F3160 Bipolar disorder, current episode mixed, unspecified: Secondary | ICD-10-CM

## 2014-01-28 DIAGNOSIS — R609 Edema, unspecified: Secondary | ICD-10-CM

## 2014-01-28 DIAGNOSIS — E039 Hypothyroidism, unspecified: Secondary | ICD-10-CM

## 2014-01-28 NOTE — Assessment & Plan Note (Signed)
race only. Continue Furosemide 20mg  bid and Kcl 43meq bid. Update CMP

## 2014-01-28 NOTE — Assessment & Plan Note (Signed)
o overt bleed, though FOBT was positive. Iron levels low, TIBC normal, ferritin 60. Hgb dropped to 6.6 with initial fluid resuscitation and she received 2 U prbs with appropriate response. Hgb 9.4 with lower MCV76.6, MCH 24,4-takes  Fe 325mg  daily-update CBC. Takes Omeprazole 20mg  daily for GI protection.

## 2014-01-28 NOTE — Assessment & Plan Note (Signed)
Chronic, update CBC, CMP, TSH, UA C/S

## 2014-01-28 NOTE — Assessment & Plan Note (Addendum)
Stable, takes Senna II qhs.

## 2014-01-28 NOTE — Assessment & Plan Note (Signed)
Presently taking Lamictal 200mg  daily, Sertraline 25mg , and Clonazepam. Still anxious at times.

## 2014-01-28 NOTE — Assessment & Plan Note (Signed)
takes Levothyroxine 49mcg, TSH 0.583 12/01/13, update TSH

## 2014-01-28 NOTE — Progress Notes (Signed)
Patient ID: Jenny [name], female   DOB: [birth year], 80 y.o.   MRN: [id number]   Code Status: Full Code MOST  Allergies  Allergen Reactions  . Levaquin [Levofloxacin In D5w] Hives  . Penicillins Rash    Chief Complaint  Patient presents with  . Medical Managment of Chronic Issues  . Acute Visit    fatigue, anxiety.     HPI: Patient is a 80 y.o. female seen in the AL at [facility] today for evaluation of fatigue, anemia, hyponatremia, anxiety, resolved chest pain-s/p NTG,  and other chronic medical conditions.   Hospitalized 11/15/13-11/20/13 for PNA.    Problem List Items Addressed This Visit   Edema (Chronic)     race only. Continue Furosemide 20mg  bid and Kcl 70meq bid. Update CMP     Hypothyroidism - Primary (Chronic)     takes Levothyroxine 56mcg, TSH 0.583 12/01/13, update TSH      Fatigue     Chronic, update CBC, CMP, TSH, UA C/S    Constipation     Stable, takes Senna II qhs.        Bipolar I disorder, most recent episode mixed     Presently taking Lamictal 200mg  daily, Sertraline 25mg , and Clonazepam. Still anxious at times.     Anemia     o overt bleed, though FOBT was positive. Iron levels low, TIBC normal, ferritin 60. Hgb dropped to 6.6 with initial fluid resuscitation and she received 2 U prbs with appropriate response. Hgb 9.4 with lower MCV76.6, MCH 24,4-takes  Fe 325mg  daily-update CBC. Takes Omeprazole 20mg  daily for GI protection.     GERD (gastroesophageal reflux disease)     Stable, takes Omeprazole 20mg  daily.        Review of Systems:  Review of Systems  Constitutional: Positive for malaise/fatigue. Negative for fever, chills, weight loss and diaphoresis.       Generalized weakness  HENT: Positive for hearing loss. Negative for congestion, ear pain and sore throat.   Eyes: Negative for pain, discharge and redness.  Respiratory: Negative for cough, hemoptysis, sputum production, shortness of breath and wheezing.   Cardiovascular:  Positive for leg swelling. Negative for chest pain, palpitations, orthopnea, claudication and PND.       Trace BLE  Gastrointestinal: Negative for heartburn, nausea, vomiting, abdominal pain, diarrhea, constipation and blood in stool.  Genitourinary: Positive for frequency. Negative for dysuria, urgency, hematuria and flank pain.  Musculoskeletal: Positive for joint pain. Negative for back pain, falls, myalgias and neck pain.  Skin: Negative for itching and rash.  Neurological: Positive for weakness. Negative for dizziness, tingling, tremors, sensory change, speech change, focal weakness, seizures, loss of consciousness and headaches.  Endo/Heme/Allergies: Negative for environmental allergies and polydipsia. Does not bruise/bleed easily.  Psychiatric/Behavioral: Positive for depression and memory loss. Negative for hallucinations. The patient is nervous/anxious. The patient does not have insomnia.      Past Medical History  Diagnosis Date  . Hypertension   . Coronary artery disease   . Heart murmur   . Hypothyroidism 05-02-12    tx. Synthroid  . Headache(784.0) 05-02-12    past hx. migrianes-many yrs ago  . Arthritis 05-02-12    osteoarthritis-rt. hip  . Pneumonia 05-02-12    mild case  . 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 Acute upper respiratory infections of unspecified site 2014  . Unspecified venous (peripheral) insufficiency 2013  . Senile osteoporosis 2013  . Unspecified closed fracture of pelvis 10/13/2012  . Cellulitis and abscess of  leg, except foot 2013  . Altered mental status 2013  . Chronic pain syndrome 2013  . Urinary tract infection, site not specified   . Hyposmolality and/or hyponatremia 05/2012  . Congestive heart failure, unspecified 05/2012  . Pneumonia, organism unspecified 05/2012  . Pressure ulcer, buttock(707.05) 05/2012  . Hypoxemia 05/2012  . Reflux esophagitis 3013  . Unspecified constipation 2013  . Acute posthemorrhagic anemia 05/2012  . Coronary  atherosclerosis of native coronary artery 05/2012  . Pain in joint, pelvic region and thigh 04/2012  . Dysphagia, unspecified(787.20) 03/2012  . Benign neoplasm of adrenal gland 10/2011  . Alzheimer's disease 06/2011  . Other seborrheic keratosis 2012  . Hypertensive renal disease, benign 2012  . Pain in joint, lower leg 2012  . Memory loss 09/2010  . Spinal stenosis, lumbar region, without neurogenic claudication 2011  . Unspecified vitamin D deficiency 2010  . Unspecified disorder of kidney and ureter 2010  . Edema 2010  . Other malaise and fatigue 2009  . Congenital factor VIII disorder 2009  . Senile dementia with delusional features 2008  . Generalized hyperhidrosis 2007  . Irritable bowel syndrome 2007  . Unspecified chronic bronchitis 2006  . Depression 06/2005     depression only,tx. oral meds  . Shortness of breath 2005  . Anxiety 2004  . Unspecified late effects of cerebrovascular disease 2004  . Migraine with aura, without mention of intractable migraine without mention of status migrainosus 2003  . Tinnitus 2003  . Other symptoms involving cardiovascular system 2003  . Hyperlipidemia 2002   Past Surgical History  Procedure Laterality Date  . Tonsillectomy    . Tubal ligation  05-02-12  .  tummy tuck  05-02-12    many yrs ago  . Cataract extraction, bilateral  05-02-12    bilateral  . Retinal detachment surgery  05-02-12    left  . Total hip arthroplasty  05/09/2012    Procedure: TOTAL HIP ARTHROPLASTY ANTERIOR APPROACH;  Surgeon: Mcarthur Rossetti, MD;  Location: [facility];  Service: Orthopedics;  Laterality: Right;  . Dental surgery  08/11/13    3 teeth surgical removed by Dr. Lewanda Rife   Social History:   reports that she quit smoking about 5 years ago. Her smoking use included Cigarettes. She has a 62 pack-year smoking history. She has never used smokeless tobacco. She reports that she does not drink alcohol or use illicit drugs.  Family History  Problem Relation  Age of Onset  . Diabetes Father     Medications: Patient's Medications  New Prescriptions   No medications on file  Previous Medications   ACETAMINOPHEN (TYLENOL) 325 MG TABLET    Take 325 mg by mouth daily as needed for mild pain. One daily as needed   ASCORBIC ACID (VITAMIN C) 1000 MG TABLET    Take 1,000 mg by mouth daily with breakfast.   ASPIRIN 325 MG TABLET    Take 1 tablet (325 mg total) by mouth daily.   BRIMONIDINE-TIMOLOL (COMBIGAN) 0.2-0.5 % OPHTHALMIC SOLUTION    Place 1 drop into both eyes every 12 (twelve) hours.    CALCITONIN, SALMON, (MIACALCIN/FORTICAL) 200 UNIT/ACT NASAL SPRAY    Place 1 spray into alternate nostrils daily. Use one spray in one nostril once daily. Alternate nostrils daily   CALCIUM-VITAMIN D (OSCAL WITH D) 500-200 MG-UNIT PER TABLET    Take 1 tablet by mouth 3 (three) times daily. Take one tablet three times daily for calcium supplementation   CLONAZEPAM (KLONOPIN) 0.5 MG  TABLET    Take 0.25 mg by mouth 3 (three) times daily. 0.5mg  qhs and 0.25mg  q4h prn   FUROSEMIDE (LASIX) 40 MG TABLET    Take 0.5 tablets (20 mg total) by mouth 2 (two) times daily. Take one tablet twice daily to prevent fluid retention.   LAMOTRIGINE (LAMICTAL) 200 MG TABLET    Take 200 mg by mouth daily.   LEVOTHYROXINE (SYNTHROID, LEVOTHROID) 88 MCG TABLET    Take 88 mcg by mouth daily before breakfast.   MULTIVITAMIN-LUTEIN (OCUVITE-LUTEIN) CAPS    Take 1 capsule by mouth daily.   NAPROXEN SODIUM (ANAPROX) 220 MG TABLET    Take 220 mg by mouth daily. Take one daily   POTASSIUM CHLORIDE SA (K-DUR,KLOR-CON) 20 MEQ TABLET    Take 20 mEq by mouth 2 (two) times daily.   SENNA (SENOKOT) 8.6 MG TABS    Take 2 tablets by mouth at bedtime.   SERTRALINE (ZOLOFT) 25 MG TABLET    Take 1 tablet (25 mg total) by mouth daily.  Modified Medications   No medications on file  Discontinued Medications   No medications on file     Physical Exam: Physical Exam  Constitutional:  Elderly. Frail.  C/o fatigue. Ambulates with walker.   HENT:   External auditory canals normal. Partial hearing loss bilaterally.  Eyes: EOM are normal. Pupils are equal, round, and reactive to light.  Wears corrective lenses.  Neck: Neck supple. No JVD present. No tracheal deviation present. No thyromegaly present.  Cardiovascular: Normal rate and intact distal pulses.  Exam reveals no gallop and no friction rub.   Murmur heard.  Systolic murmur is present with a grade of 3/6  Pulmonary/Chest: Effort normal. No respiratory distress. She has no wheezes. She has rales. She exhibits no tenderness.  Abdominal: Soft. Bowel sounds are normal. She exhibits no distension and no mass. There is no tenderness.  Musculoskeletal: She exhibits edema. She exhibits no tenderness.  Limited range of motion of the right shoulder. Trace edema BLE  Lymphadenopathy:    She has no cervical adenopathy.  Neurological:  Conversational, but there is a significant memory deficit.  Skin:  Multiple senile ecchymoses, otherwise normal  Psychiatric: She has a normal mood and affect. Her behavior is normal.    Filed Vitals:   01/28/14 1324  BP: 120/68  Pulse: 80  Temp: 97.9 F (36.6 C)  TempSrc: Tympanic  Resp: 21      Labs reviewed: Basic Metabolic Panel:  Recent Labs  11/17/13 0408  11/19/13 0325 11/19/13 0515 11/20/13 0534 11/26/13  NA 125*  < > 126* 125* 125* 134*  K 4.0  < > 4.2 4.0 3.6* 4.9  CL 87*  < > 86* 86* 88*  --   CO2 22  < > 24 22 23   --   GLUCOSE 131*  < > 111* 130* 109*  --   BUN 26*  < > 20 19 18 12   CREATININE 1.08  < > 1.02 0.97 0.96 1.1  CALCIUM 8.3*  < > 8.8 8.5 8.3*  --   PHOS  --   --   --   --  2.8  --   TSH 1.123  --   --   --   --   --   < > = values in this interval not displayed. Liver Function Tests:  Recent Labs  11/15/13 0500 11/16/13 0502 11/20/13 0534  AST 15 18  --   ALT 13 13  --  ALKPHOS 83 74  --   BILITOT <0.2* <0.2*  --   PROT 6.7 6.0  --   ALBUMIN 2.7* 2.2*  2.3*   CBC:  Recent Labs  06/15/13 1531 11/15/13 0500  11/19/13 0325 11/19/13 0515 11/20/13 0534 11/26/13  WBC 9.9 17.3*  < > 13.5* 12.5* 10.0 7.4  NEUTROABS 5.6 13.0*  --   --   --   --   --   HGB 11.8* 8.0*  < > 10.0* 9.8* 9.3* 9.4*  HCT 35.7* 24.1*  < > 30.2* 30.2* 27.0* 30*  MCV 83.1 70.5*  < > 75.1* 75.3* 74.4*  --   PLT 469.0* 552*  < > 494* 461* 427* 544*  < > = values in this interval not displayed.  Past Procedures:  11/15/2013 CLINICAL DATA: Cough. Evaluate for pneumonia versus edema. EXAM: CHEST - IMPRESSION: Persistent left lower lobe airspace disease suspicious for pneumonia. No significant pleural effusion.   11/15/2013 CLINICAL DATA: Chest pain EXAM: PORTABLE CHEST - IMPRESSION: Left lower lobe infiltrate, worrisome for pneumonia.   Assessment/Plan Hypothyroidism takes Levothyroxine 69mcg, TSH 0.583 12/01/13, update TSH    Fatigue Chronic, update CBC, CMP, TSH, UA C/S  GERD (gastroesophageal reflux disease) Stable, takes Omeprazole 20mg  daily.   Bipolar I disorder, most recent episode mixed Presently taking Lamictal 200mg  daily, Sertraline 25mg , and Clonazepam. Still anxious at times.   Anemia o overt bleed, though FOBT was positive. Iron levels low, TIBC normal, ferritin 60. Hgb dropped to 6.6 with initial fluid resuscitation and she received 2 U prbs with appropriate response. Hgb 9.4 with lower MCV76.6, MCH 24,4-takes  Fe 325mg  daily-update CBC. Takes Omeprazole 20mg  daily for GI protection.   Edema race only. Continue Furosemide 20mg  bid and Kcl 10meq bid. Update CMP   Constipation Stable, takes Senna II qhs.        Family/ Staff Communication: observe the patient  Goals of Care: AL  Labs/tests ordered:  CBC, CMP, TSH, UA C/S

## 2014-01-28 NOTE — Assessment & Plan Note (Signed)
Stable, takes Omeprazole 20mg daily.  

## 2014-01-29 ENCOUNTER — Ambulatory Visit (INDEPENDENT_AMBULATORY_CARE_PROVIDER_SITE_OTHER): Payer: Medicare Other | Admitting: Family Medicine

## 2014-01-29 ENCOUNTER — Telehealth: Payer: Self-pay

## 2014-01-29 VITALS — BP 122/64 | HR 77 | Temp 97.5°F | Resp 16 | Ht 64.0 in | Wt 150.0 lb

## 2014-01-29 DIAGNOSIS — R599 Enlarged lymph nodes, unspecified: Secondary | ICD-10-CM

## 2014-01-29 DIAGNOSIS — R590 Localized enlarged lymph nodes: Secondary | ICD-10-CM

## 2014-01-29 LAB — POCT RAPID STREP A (OFFICE): RAPID STREP A SCREEN: NEGATIVE

## 2014-01-29 MED ORDER — CEPHALEXIN 500 MG PO CAPS: ORAL_CAPSULE | ORAL | Status: DC

## 2014-01-29 NOTE — Patient Instructions (Addendum)
Take ibuprofen 200 mg 2 tablets 3 times daily. Eat a little something with them. If they bother your stomach or cause you to swell more stopped it immediately.  If not improving by Sunday, 01/31/14,  go ahead and begin the antibiotics, cephalexin 500 mg twice daily.  If any rash or problems from the medication discontinued promptly

## 2014-01-29 NOTE — Telephone Encounter (Signed)
Patient is allergic to penicillians including todays script  Please call in something different to [place]   [phone]

## 2014-01-29 NOTE — Progress Notes (Signed)
Subjective: Patient is here with about for 5 day history of discomfort in her neck anteriorly. She says it is not a sore throat, then it is just sore and tender in the sides of her front of the neck. She's not been running a fever. Not had any cold or sore throat or cough. Her daughter does think that her voice is a little bit raspy. Dr. Elder Negus who usually sees her, and the other provider at the friend's home, are not available today.  She generally functions fairly well. She and her husband live together.  Objective: Pleasant lady, able to give her own history. Her TMs are normal. Throat not erythematous. Neck supple but has some small bilateral cervical nodes. These are tender. They are anterior nodes. Chest is clear to auscultation. Heart regular without murmur.  Assessment: Cervical lymphadenitis  Plan: Probably this represents a viral infection, but will check a strep test first before deciding treatment.  Results for orders placed in visit on 01/29/14  POCT RAPID STREP A (OFFICE)      Result Value Ref Range   Rapid Strep A Screen Negative  Negative   Although this is probably viral, I think I will go ahead and treat with antibiotics if not improving by Sunday. Will give it 2 days on ibuprofen before making that decision.

## 2014-01-31 LAB — CULTURE, GROUP A STREP: Organism ID, Bacteria: NORMAL

## 2014-01-31 NOTE — Telephone Encounter (Signed)
Looks like Dr. Linna Darner felt this was likely viral.  How is pt doing?  If improving, no need for antibiotics

## 2014-02-02 LAB — HEPATIC FUNCTION PANEL
ALT: 9 U/L (ref 7–35)
AST: 15 U/L (ref 13–35)
Alkaline Phosphatase: 69 U/L (ref 25–125)
Bilirubin, Total: 0.2 mg/dL

## 2014-02-02 LAB — BASIC METABOLIC PANEL
BUN: 14 mg/dL (ref 4–21)
Creatinine: 1 mg/dL (ref 0.5–1.1)
Glucose: 88 mg/dL
POTASSIUM: 5.2 mmol/L (ref 3.4–5.3)
Sodium: 134 mmol/L — AB (ref 137–147)

## 2014-02-02 LAB — CBC AND DIFFERENTIAL
HCT: 31 % — AB (ref 36–46)
Hemoglobin: 10.1 g/dL — AB (ref 12.0–16.0)
Platelets: 583 10*3/uL — AB (ref 150–399)
WBC: 6.2 10^3/mL

## 2014-02-02 LAB — TSH: TSH: 0.72 u[IU]/mL (ref 0.41–5.90)

## 2014-02-04 ENCOUNTER — Non-Acute Institutional Stay: Payer: Medicare Other | Admitting: Nurse Practitioner

## 2014-02-04 ENCOUNTER — Encounter: Payer: Self-pay | Admitting: Nurse Practitioner

## 2014-02-04 DIAGNOSIS — R609 Edema, unspecified: Secondary | ICD-10-CM

## 2014-02-04 DIAGNOSIS — D649 Anemia, unspecified: Secondary | ICD-10-CM

## 2014-02-04 DIAGNOSIS — F3289 Other specified depressive episodes: Secondary | ICD-10-CM

## 2014-02-04 DIAGNOSIS — R1319 Other dysphagia: Secondary | ICD-10-CM

## 2014-02-04 DIAGNOSIS — F329 Major depressive disorder, single episode, unspecified: Secondary | ICD-10-CM

## 2014-02-04 DIAGNOSIS — I4891 Unspecified atrial fibrillation: Secondary | ICD-10-CM

## 2014-02-04 DIAGNOSIS — I480 Paroxysmal atrial fibrillation: Secondary | ICD-10-CM

## 2014-02-04 DIAGNOSIS — E039 Hypothyroidism, unspecified: Secondary | ICD-10-CM

## 2014-02-04 DIAGNOSIS — K219 Gastro-esophageal reflux disease without esophagitis: Secondary | ICD-10-CM

## 2014-02-04 DIAGNOSIS — F32A Depression, unspecified: Secondary | ICD-10-CM

## 2014-02-04 HISTORY — DX: Other dysphagia: R13.19

## 2014-02-04 NOTE — Assessment & Plan Note (Signed)
no overt bleed, though FOBT was positive. Iron levels low, TIBC normal, ferritin 60. Hgb dropped to 6.6 with initial fluid resuscitation and she received 2 U prbs with appropriate response. Hgb 9.4 with lower MCV76.6, MCH 24,4-takes  Fe 325mg  daily. Takes Omeprazole 20mg  daily for GI protection. Improved. Hgb 10.8 01/19/14

## 2014-02-04 NOTE — Assessment & Plan Note (Signed)
trace only. Continue Furosemide 20mg  bid and Kcl 53meq bid. Na 136, K 4.6, Bun/creat 15/1.02 01/19/14

## 2014-02-04 NOTE — Assessment & Plan Note (Signed)
F/u Psychiatrist, currently on Zoloft 25mg  and Clonazepam 0.25mg  tid, 0.5mg  qhs, 0.25mg  q4hr prn, Lamictal 200mg  daily, off  Abilify andTrazodone. Stable.

## 2014-02-04 NOTE — Assessment & Plan Note (Addendum)
Chocking episode 2-3 days ago. ST evaluated and diet modified. C/o still has difficulty swallowing. Increase Omeprazole to bid for better managing possible GERD related symptoms. MBSS. GI consultation. ? AR of Psychotropic agents.

## 2014-02-04 NOTE — Progress Notes (Signed)
Patient ID: Jenny [name], female   DOB: 10-Jan-1927, 80 y.o.   MRN: [id number]   Code Status: Full Code MOST  Allergies  Allergen Reactions  . Levaquin [Levofloxacin In D5w] Hives  . Penicillins Rash    Chief Complaint  Patient presents with  . Medical Managment of Chronic Issues  . Acute Visit    difficulty swallowing.     HPI: Patient is a 80 y.o. female seen in the AL at [facility] today for evaluation of difficulty swallowing and chronic medical conditions.   Hospitalized 11/15/13-11/20/13 for PNA.    Problem List Items Addressed This Visit   Edema (Chronic)     trace only. Continue Furosemide 20mg  bid and Kcl 69meq bid. Na 136, K 4.6, Bun/creat 15/1.02 01/19/14    Hypothyroidism (Chronic)     akes Levothyroxine 50mcg, TSH 0.583 12/01/13,    Depressive disorder (Chronic)     F/u Psychiatrist, currently on Zoloft 25mg  and Clonazepam 0.25mg  tid, 0.5mg  qhs, 0.25mg  q4hr prn, Lamictal 200mg  daily, off  Abilify andTrazodone. Stable.       Anemia     no overt bleed, though FOBT was positive. Iron levels low, TIBC normal, ferritin 60. Hgb dropped to 6.6 with initial fluid resuscitation and she received 2 U prbs with appropriate response. Hgb 9.4 with lower MCV76.6, MCH 24,4-takes  Fe 325mg  daily. Takes Omeprazole 20mg  daily for GI protection. Improved. Hgb 10.8 01/19/14      PAF (paroxysmal atrial fibrillation)     Rate controlled.     GERD (gastroesophageal reflux disease)     Had chocking episode 2-3 days ago, ST evaluate and diet modified, still c/o difficulty swallowing, increase Omeprazole 20mg  to bid. GI consult. MBSS to evaluate furhter given hx of esophageal stricture which required dilatation.      Relevant Medications      omeprazole (PRILOSEC) 20 MG capsule   Other dysphagia - Primary     Chocking episode 2-3 days ago. ST evaluated and diet modified. C/o still has difficulty swallowing. Increase Omeprazole to bid for better managing possible GERD related  symptoms. MBSS. GI consultation. ? AR of Psychotropic agents.        Review of Systems:  Review of Systems  Constitutional: Positive for malaise/fatigue. Negative for fever, chills, weight loss and diaphoresis.       Generalized weakness  HENT: Positive for hearing loss. Negative for congestion, ear pain and sore throat.   Eyes: Negative for pain, discharge and redness.  Respiratory: Negative for cough, hemoptysis, sputum production, shortness of breath and wheezing.   Cardiovascular: Positive for leg swelling. Negative for chest pain, palpitations, orthopnea, claudication and PND.       Trace BLE  Gastrointestinal: Negative for heartburn, nausea, vomiting, abdominal pain, diarrhea, constipation and blood in stool.       Difficulty swallowing.   Genitourinary: Positive for frequency. Negative for dysuria, urgency, hematuria and flank pain.  Musculoskeletal: Positive for joint pain. Negative for back pain, falls, myalgias and neck pain.  Skin: Negative for itching and rash.  Neurological: Positive for weakness. Negative for dizziness, tingling, tremors, sensory change, speech change, focal weakness, seizures, loss of consciousness and headaches.  Endo/Heme/Allergies: Negative for environmental allergies and polydipsia. Does not bruise/bleed easily.  Psychiatric/Behavioral: Positive for depression and memory loss. Negative for hallucinations. The patient is nervous/anxious. The patient does not have insomnia.      Past Medical History  Diagnosis Date  . Hypertension   . Coronary artery disease   .  Heart murmur   . Hypothyroidism 05-02-12    tx. Synthroid  . Headache(784.0) 05-02-12    past hx. migrianes-many yrs ago  . Arthritis 05-02-12    osteoarthritis-rt. hip  . Pneumonia 05-02-12    mild case  . 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 Acute upper respiratory infections of unspecified site 2014  . Unspecified venous (peripheral) insufficiency 2013  . Senile osteoporosis 2013  .  Unspecified closed fracture of pelvis 10/13/2012  . Cellulitis and abscess of leg, except foot 2013  . Altered mental status 2013  . Chronic pain syndrome 2013  . Urinary tract infection, site not specified   . Hyposmolality and/or hyponatremia 05/2012  . Congestive heart failure, unspecified 05/2012  . Pneumonia, organism unspecified 05/2012  . Pressure ulcer, buttock(707.05) 05/2012  . Hypoxemia 05/2012  . Reflux esophagitis 3013  . Unspecified constipation 2013  . Acute posthemorrhagic anemia 05/2012  . Coronary atherosclerosis of native coronary artery 05/2012  . Pain in joint, pelvic region and thigh 04/2012  . Dysphagia, unspecified(787.20) 03/2012  . Benign neoplasm of adrenal gland 10/2011  . Alzheimer's disease 06/2011  . Other seborrheic keratosis 2012  . Hypertensive renal disease, benign 2012  . Pain in joint, lower leg 2012  . Memory loss 09/2010  . Spinal stenosis, lumbar region, without neurogenic claudication 2011  . Unspecified vitamin D deficiency 2010  . Unspecified disorder of kidney and ureter 2010  . Edema 2010  . Other malaise and fatigue 2009  . Congenital factor VIII disorder 2009  . Senile dementia with delusional features 2008  . Generalized hyperhidrosis 2007  . Irritable bowel syndrome 2007  . Unspecified chronic bronchitis 2006  . Depression 06/2005     depression only,tx. oral meds  . Shortness of breath 2005  . Anxiety 2004  . Unspecified late effects of cerebrovascular disease 2004  . Migraine with aura, without mention of intractable migraine without mention of status migrainosus 2003  . Tinnitus 2003  . Other symptoms involving cardiovascular system 2003  . Hyperlipidemia 2002   Past Surgical History  Procedure Laterality Date  . Tonsillectomy    . Tubal ligation  05-02-12  .  tummy tuck  05-02-12    many yrs ago  . Cataract extraction, bilateral  05-02-12    bilateral  . Retinal detachment surgery  05-02-12    left  . Total hip arthroplasty   05/09/2012    Procedure: TOTAL HIP ARTHROPLASTY ANTERIOR APPROACH;  Surgeon: Mcarthur Rossetti, MD;  Location: [facility];  Service: Orthopedics;  Laterality: Right;  . Dental surgery  08/11/13    3 teeth surgical removed by Dr. Lewanda Rife   Social History:   reports that she quit smoking about 5 years ago. Her smoking use included Cigarettes. She has a 62 pack-year smoking history. She has never used smokeless tobacco. She reports that she does not drink alcohol or use illicit drugs.  Family History  Problem Relation Age of Onset  . Diabetes Father     Medications: Patient's Medications  New Prescriptions   No medications on file  Previous Medications   ACETAMINOPHEN (TYLENOL) 325 MG TABLET    Take 325 mg by mouth daily as needed for mild pain. One daily as needed   ASCORBIC ACID (VITAMIN C) 1000 MG TABLET    Take 1,000 mg by mouth daily with breakfast.   ASPIRIN 325 MG TABLET    Take 1 tablet (325 mg total) by mouth daily.   BRIMONIDINE-TIMOLOL (COMBIGAN) 0.2-0.5 % OPHTHALMIC  SOLUTION    Place 1 drop into both eyes every 12 (twelve) hours.    CALCITONIN, SALMON, (MIACALCIN/FORTICAL) 200 UNIT/ACT NASAL SPRAY    Place 1 spray into alternate nostrils daily. Use one spray in one nostril once daily. Alternate nostrils daily   CALCIUM-VITAMIN D (OSCAL WITH D) 500-200 MG-UNIT PER TABLET    Take 1 tablet by mouth 3 (three) times daily. Take one tablet three times daily for calcium supplementation   CLONAZEPAM (KLONOPIN) 0.5 MG TABLET    Take 0.25 mg by mouth 3 (three) times daily. 0.5mg  qhs and 0.25mg  q4h prn   FUROSEMIDE (LASIX) 40 MG TABLET    Take 0.5 tablets (20 mg total) by mouth 2 (two) times daily. Take one tablet twice daily to prevent fluid retention.   LAMOTRIGINE (LAMICTAL) 200 MG TABLET    Take 200 mg by mouth daily.   LEVOTHYROXINE (SYNTHROID, LEVOTHROID) 88 MCG TABLET    Take 88 mcg by mouth daily before breakfast.   MULTIVITAMIN-LUTEIN (OCUVITE-LUTEIN) CAPS    Take 1 capsule by mouth  daily.   NAPROXEN SODIUM (ANAPROX) 220 MG TABLET    Take 220 mg by mouth daily. Take one daily   OMEPRAZOLE (PRILOSEC) 20 MG CAPSULE    Take 20 mg by mouth 2 (two) times daily before a meal.   POTASSIUM CHLORIDE SA (K-DUR,KLOR-CON) 20 MEQ TABLET    Take 20 mEq by mouth 2 (two) times daily.   SENNA (SENOKOT) 8.6 MG TABS    Take 2 tablets by mouth at bedtime.   SERTRALINE (ZOLOFT) 25 MG TABLET    Take 1 tablet (25 mg total) by mouth daily.  Modified Medications   No medications on file  Discontinued Medications   CEPHALEXIN (KEFLEX) 500 MG CAPSULE    Take one pill twice daily for infected glands in neck     Physical Exam: Physical Exam  Constitutional:  Elderly. Frail. C/o fatigue. Ambulates with walker.   HENT:  Mouth/Throat: Mucous membranes are dry. No oral lesions. No uvula swelling. No oropharyngeal exudate, posterior oropharyngeal edema, posterior oropharyngeal erythema or tonsillar abscesses.   External auditory canals normal. Partial hearing loss bilaterally.  Eyes: EOM are normal. Pupils are equal, round, and reactive to light.  Wears corrective lenses.  Neck: Neck supple. No JVD present. No tracheal deviation present. No thyromegaly present.  Cardiovascular: Normal rate and intact distal pulses.  Exam reveals no gallop and no friction rub.   Murmur heard.  Systolic murmur is present with a grade of 3/6  Pulmonary/Chest: Effort normal. No respiratory distress. She has no wheezes. She has rales. She exhibits no tenderness.  Abdominal: Soft. Bowel sounds are normal. She exhibits no distension and no mass. There is no tenderness.  Musculoskeletal: She exhibits edema. She exhibits no tenderness.  Limited range of motion of the right shoulder. Trace edema BLE  Lymphadenopathy:    She has no cervical adenopathy.  Neurological:  Conversational, but there is a significant memory deficit.  Skin:  Multiple senile ecchymoses, otherwise normal  Psychiatric: She has a normal mood and  affect. Her behavior is normal.    Filed Vitals:   02/04/14 1253  BP: 130/86  Pulse: 78  Temp: 98.2 F (36.8 C)  TempSrc: Tympanic  Resp: 17   Labs reviewed: Basic Metabolic Panel:  Recent Labs  11/17/13 0408  11/19/13 0325 11/19/13 0515 11/20/13 0534 11/26/13 01/19/14  NA 125*  < > 126* 125* 125* 134* 136*  K 4.0  < > 4.2 4.0 3.6*  4.9 4.6  CL 87*  < > 86* 86* 88*  --   --   CO2 22  < > 24 22 23   --   --   GLUCOSE 131*  < > 111* 130* 109*  --   --   BUN 26*  < > 20 19 18 12 15   CREATININE 1.08  < > 1.02 0.97 0.96 1.1 1.0  CALCIUM 8.3*  < > 8.8 8.5 8.3*  --   --   PHOS  --   --   --   --  2.8  --   --   TSH 1.123  --   --   --   --   --   --   < > = values in this interval not displayed. Liver Function Tests:  Recent Labs  11/15/13 0500 11/16/13 0502 11/20/13 0534 01/19/14  AST 15 18  --  18  ALT 13 13  --  11  ALKPHOS 83 74  --  67  BILITOT <0.2* <0.2*  --   --   PROT 6.7 6.0  --   --   ALBUMIN 2.7* 2.2* 2.3*  --    CBC:  Recent Labs  06/15/13 1531 11/15/13 0500  11/19/13 0325 11/19/13 0515 11/20/13 0534 11/26/13 01/19/14  WBC 9.9 17.3*  < > 13.5* 12.5* 10.0 7.4 5.8  NEUTROABS 5.6 13.0*  --   --   --   --   --   --   HGB 11.8* 8.0*  < > 10.0* 9.8* 9.3* 9.4* 10.8*  HCT 35.7* 24.1*  < > 30.2* 30.2* 27.0* 30* 34*  MCV 83.1 70.5*  < > 75.1* 75.3* 74.4*  --   --   PLT 469.0* 552*  < > 494* 461* 427* 544* 517*  < > = values in this interval not displayed.  Past Procedures:  11/15/2013 CLINICAL DATA: Cough. Evaluate for pneumonia versus edema. EXAM: CHEST - IMPRESSION: Persistent left lower lobe airspace disease suspicious for pneumonia. No significant pleural effusion.   11/15/2013 CLINICAL DATA: Chest pain EXAM: PORTABLE CHEST - IMPRESSION: Left lower lobe infiltrate, worrisome for pneumonia.   Assessment/Plan Depressive disorder F/u Psychiatrist, currently on Zoloft 25mg  and Clonazepam 0.25mg  tid, 0.5mg  qhs, 0.25mg  q4hr prn, Lamictal 200mg  daily, off   Abilify andTrazodone. Stable.     Anemia no overt bleed, though FOBT was positive. Iron levels low, TIBC normal, ferritin 60. Hgb dropped to 6.6 with initial fluid resuscitation and she received 2 U prbs with appropriate response. Hgb 9.4 with lower MCV76.6, MCH 24,4-takes  Fe 325mg  daily. Takes Omeprazole 20mg  daily for GI protection. Improved. Hgb 10.8 01/19/14    GERD (gastroesophageal reflux disease) Had chocking episode 2-3 days ago, ST evaluate and diet modified, still c/o difficulty swallowing, increase Omeprazole 20mg  to bid. GI consult. MBSS to evaluate furhter given hx of esophageal stricture which required dilatation.    Other dysphagia Chocking episode 2-3 days ago. ST evaluated and diet modified. C/o still has difficulty swallowing. Increase Omeprazole to bid for better managing possible GERD related symptoms. MBSS. GI consultation. ? AR of Psychotropic agents.   PAF (paroxysmal atrial fibrillation) Rate controlled.   Edema trace only. Continue Furosemide 20mg  bid and Kcl 38meq bid. Na 136, K 4.6, Bun/creat 15/1.02 01/19/14  Hypothyroidism akes Levothyroxine 59mcg, TSH 0.583 12/01/13,    Family/ Staff Communication: observe the patient  Goals of Care: AL  Labs/tests ordered: MBSS

## 2014-02-04 NOTE — Assessment & Plan Note (Signed)
Rate controlled 

## 2014-02-04 NOTE — Assessment & Plan Note (Signed)
akes Levothyroxine 64mcg, TSH 0.583 12/01/13,

## 2014-02-04 NOTE — Assessment & Plan Note (Signed)
Had chocking episode 2-3 days ago, ST evaluate and diet modified, still c/o difficulty swallowing, increase Omeprazole 20mg  to bid. GI consult. MBSS to evaluate furhter given hx of esophageal stricture which required dilatation.

## 2014-02-15 ENCOUNTER — Other Ambulatory Visit (HOSPITAL_COMMUNITY): Payer: Self-pay | Admitting: Internal Medicine

## 2014-02-15 DIAGNOSIS — R1310 Dysphagia, unspecified: Secondary | ICD-10-CM

## 2014-02-18 ENCOUNTER — Ambulatory Visit (HOSPITAL_COMMUNITY)
Admission: RE | Admit: 2014-02-18 | Discharge: 2014-02-18 | Disposition: A | Discharge status: Home / Self Care | Payer: Medicare Other | Source: Ambulatory Visit | Attending: Internal Medicine | Admitting: Internal Medicine

## 2014-02-18 ENCOUNTER — Other Ambulatory Visit: Payer: Self-pay | Admitting: Internal Medicine

## 2014-02-18 ENCOUNTER — Other Ambulatory Visit (HOSPITAL_COMMUNITY): Payer: Self-pay | Admitting: Internal Medicine

## 2014-02-18 DIAGNOSIS — R1310 Dysphagia, unspecified: Secondary | ICD-10-CM

## 2014-02-18 DIAGNOSIS — R1313 Dysphagia, pharyngeal phase: Secondary | ICD-10-CM | POA: Insufficient documentation

## 2014-02-18 DIAGNOSIS — R1311 Dysphagia, oral phase: Secondary | ICD-10-CM | POA: Insufficient documentation

## 2014-02-18 NOTE — Procedures (Signed)
Objective Swallowing Evaluation: Modified Barium Swallowing Study  Patient Details  Name: Jenny [name] MRN: [id number] Date of Birth: 1926/12/23  Today's Date: 02/18/2014 Time: 1225-1255 SLP Time Calculation (min): 30 min  Past Medical History:  Past Medical History  Diagnosis Date  . Hypertension   . Coronary artery disease   . Heart murmur   . Hypothyroidism 05-02-12    tx. Synthroid  . Headache(784.0) 05-02-12    past hx. migrianes-many yrs ago  . Arthritis 05-02-12    osteoarthritis-rt. hip  . Pneumonia 05-02-12    mild case  . 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 Acute upper respiratory infections of unspecified site 2014  . Unspecified venous (peripheral) insufficiency 2013  . Senile osteoporosis 2013  . Unspecified closed fracture of pelvis 10/13/2012  . Cellulitis and abscess of leg, except foot 2013  . Altered mental status 2013  . Chronic pain syndrome 2013  . Urinary tract infection, site not specified   . Hyposmolality and/or hyponatremia 05/2012  . Congestive heart failure, unspecified 05/2012  . Pneumonia, organism unspecified 05/2012  . Pressure ulcer, buttock(707.05) 05/2012  . Hypoxemia 05/2012  . Reflux esophagitis 3013  . Unspecified constipation 2013  . Acute posthemorrhagic anemia 05/2012  . Coronary atherosclerosis of native coronary artery 05/2012  . Pain in joint, pelvic region and thigh 04/2012  . Dysphagia, unspecified(787.20) 03/2012  . Benign neoplasm of adrenal gland 10/2011  . Alzheimer's disease 06/2011  . Other seborrheic keratosis 2012  . Hypertensive renal disease, benign 2012  . Pain in joint, lower leg 2012  . Memory loss 09/2010  . Spinal stenosis, lumbar region, without neurogenic claudication 2011  . Unspecified vitamin D deficiency 2010  . Unspecified disorder of kidney and ureter 2010  . Edema 2010  . Other malaise and fatigue 2009  . Congenital factor VIII disorder 2009  . Senile dementia with delusional features 2008  . Generalized  hyperhidrosis 2007  . Irritable bowel syndrome 2007  . Unspecified chronic bronchitis 2006  . Depression 06/2005     depression only,tx. oral meds  . Shortness of breath 2005  . Anxiety 2004  . Unspecified late effects of cerebrovascular disease 2004  . Migraine with aura, without mention of intractable migraine without mention of status migrainosus 2003  . Tinnitus 2003  . Other symptoms involving cardiovascular system 2003  . Hyperlipidemia 2002   Past Surgical History:  Past Surgical History  Procedure Laterality Date  . Tonsillectomy    . Tubal ligation  05-02-12  .  tummy tuck  05-02-12    many yrs ago  . Cataract extraction, bilateral  05-02-12    bilateral  . Retinal detachment surgery  05-02-12    left  . Total hip arthroplasty  05/09/2012    Procedure: TOTAL HIP ARTHROPLASTY ANTERIOR APPROACH;  Surgeon: Mcarthur Rossetti, MD;  Location: [facility];  Service: Orthopedics;  Laterality: Right;  . Dental surgery  08/11/13    3 teeth surgical removed by Dr. Lewanda Rife   HPI:  Pt is an 80 year old female arriving for an outpatient MBS from an ALF with her son. Pt was initially ordered to have an Esophagram, but pt aspirated first sip of dense contrast and study was terminated, SLP was requested to complete an MBS. Apparently pt was referred by her SLP, She has noted to have a feeling of food getting stuck, dry mouth, coughing particularly with solids and has a history of esophageal stricture. Pt did report pna 1x last winter. She  also reports her diet has been changed to "baby food" but says it is chopped, not pureed.      Assessment / Plan / Recommendation Clinical Impression  Dysphagia Diagnosis: Mild oral phase dysphagia;Mild pharyngeal phase dysphagia Clinical impression: Pt demonstrates a mild oral dysphagia, impaired by lack of posterior dentition for normal mastication. Pt utilizes front dentition adequately, but may need training for precuations and bolus prep with regular  textured meals. There a mild sensorimotor oropharyngeal deficits. Pt has a slight delay in swallow initiation allowing for intermittent flash penetration before the swallow with thin liquids. No aspiration occurred during this study, but aspiration events before the swallow are proable when pt is not fully prepared for large bolus. Discussed a brief oral holding strategy to increase preparedness for bolus. Pt also has mild base of tongue and hyolaryngeal weakness with vallecular and mild pyriform residuals. UES is suspected to be hypertensive contributing to residue. An esophageal sweep did not reveal any significant findings.   Of note, when fluoro was initially turned on, pt had a large amount of pooled contrast in the pyriforms following single bolus and aspiration event during esophagram. Suspect this may have been backflow from esophagus possibly due to prolonged hard coughing. Pt did not have any comparable residue or backflow during the MBS. Recommend pt consume a Regular diet per her wishes with thin liquids, but with continued SLP services for pt preparation of small bolus size, adequate mastication, aspiration precautions and strategies per SLP and strengthening exercises.     Treatment Recommendation  Defer treatment plan to SLP at (Comment) (ALF home health)    Diet Recommendation Regular;Thin liquid   Liquid Administration via: Cup;Straw Medication Administration: Whole meds with puree Supervision: Patient able to self feed;Intermittent supervision to cue for compensatory strategies Compensations: Slow rate;Small sips/bites;Follow solids with liquid Postural Changes and/or Swallow Maneuvers: Seated upright 90 degrees    Other  Recommendations     Follow Up Recommendations  Home health SLP    Frequency and Duration        Pertinent Vitals/Pain NA    SLP Swallow Goals     General HPI: Pt is an 80 year old female arriving for an outpatient MBS from an ALF with her son. Pt was  initially ordered to have an Esophagram, but pt aspirated first sip of dense contrast and study was terminated, SLP was requested to complete an MBS. Apparently pt was referred by her SLP, She has noted to have a feeling of food getting stuck, dry mouth, coughing particularly with solids and has a history of esophageal stricture. Pt did report pna 1x last winter. She also reprots her diet has been changed to "baby food" but says it is chopped, not pureed.  Type of Study: Modified Barium Swallowing Study Reason for Referral: Objectively evaluate swallowing function Previous Swallow Assessment: Esophagram 4/9 - "Aspiration with thick liquids. Study was terminated and modified barium swallow ordered" Diet Prior to this Study: Dysphagia 3 (soft);Thin liquids Temperature Spikes Noted: N/A Respiratory Status: Room air History of Recent Intubation: No Behavior/Cognition: Alert;Cooperative;Pleasant mood Oral Cavity - Dentition: Missing dentition (only front dentition, bottom and top) Oral Motor / Sensory Function: Within functional limits Self-Feeding Abilities: Able to feed self Patient Positioning: Upright in chair Baseline Vocal Quality: Clear Volitional Cough: Strong Volitional Swallow: Able to elicit Anatomy: Within functional limits    Reason for Referral Objectively evaluate swallowing function   Oral Phase Oral Preparation/Oral Phase Oral Phase: Impaired Oral - Thin Oral - Thin  Cup: Within functional limits Oral - Thin Straw: Within functional limits Oral - Solids Oral - Puree: Within functional limits Oral - Mechanical Soft: Impaired mastication (only front dentition, abnormal mastication pattern)   Pharyngeal Phase Pharyngeal Phase Pharyngeal Phase: Impaired Pharyngeal - Thin Pharyngeal - Thin Cup: Delayed swallow initiation;Reduced epiglottic inversion;Reduced tongue base retraction;Pharyngeal residue - valleculae;Pharyngeal residue - pyriform sinuses;Reduced anterior laryngeal  mobility;Reduced laryngeal elevation;Penetration/Aspiration before swallow Penetration/Aspiration details (thin cup): Material enters airway, remains ABOVE vocal cords then ejected out;Material does not enter airway Pharyngeal - Thin Straw: Delayed swallow initiation;Reduced epiglottic inversion;Reduced tongue base retraction;Pharyngeal residue - valleculae;Pharyngeal residue - pyriform sinuses;Reduced anterior laryngeal mobility;Reduced laryngeal elevation;Penetration/Aspiration before swallow Penetration/Aspiration details (thin straw): Material enters airway, remains ABOVE vocal cords then ejected out;Material does not enter airway Pharyngeal - Solids Pharyngeal - Puree: Delayed swallow initiation;Reduced epiglottic inversion;Reduced tongue base retraction;Pharyngeal residue - valleculae;Pharyngeal residue - pyriform sinuses;Reduced anterior laryngeal mobility;Reduced laryngeal elevation  Cervical Esophageal Phase    GO    Cervical Esophageal Phase Cervical Esophageal Phase: Impaired Cervical Esophageal Phase - Comment Cervical Esophageal Comment: Appearance of hypertensive UES, Esophageal sweep showed no significant stasis.     Functional Assessment Tool Used: clinical judgement Functional Limitations: Swallowing Swallow Current Status (B0962): At least 20 percent but less than 40 percent impaired, limited or restricted Swallow Goal Status [phone]): At least 20 percent but less than 40 percent impaired, limited or restricted Swallow Discharge Status [phone]): At least 20 percent but less than 40 percent impaired, limited or restricted   [facility], [place] CCC-SLP [place] Armour Villanueva 02/18/2014, 3:36 PM

## 2014-04-01 ENCOUNTER — Encounter: Payer: Self-pay | Admitting: Nurse Practitioner

## 2014-04-01 ENCOUNTER — Non-Acute Institutional Stay: Payer: Medicare Other | Admitting: Nurse Practitioner

## 2014-04-01 DIAGNOSIS — K5900 Constipation, unspecified: Secondary | ICD-10-CM

## 2014-04-01 DIAGNOSIS — E039 Hypothyroidism, unspecified: Secondary | ICD-10-CM

## 2014-04-01 DIAGNOSIS — R609 Edema, unspecified: Secondary | ICD-10-CM

## 2014-04-01 DIAGNOSIS — F0280 Dementia in other diseases classified elsewhere without behavioral disturbance: Secondary | ICD-10-CM

## 2014-04-01 DIAGNOSIS — F32A Depression, unspecified: Secondary | ICD-10-CM

## 2014-04-01 DIAGNOSIS — K219 Gastro-esophageal reflux disease without esophagitis: Secondary | ICD-10-CM

## 2014-04-01 DIAGNOSIS — F3289 Other specified depressive episodes: Secondary | ICD-10-CM

## 2014-04-01 DIAGNOSIS — F329 Major depressive disorder, single episode, unspecified: Secondary | ICD-10-CM

## 2014-04-01 DIAGNOSIS — R5383 Other fatigue: Secondary | ICD-10-CM

## 2014-04-01 DIAGNOSIS — D649 Anemia, unspecified: Secondary | ICD-10-CM

## 2014-04-01 DIAGNOSIS — R5381 Other malaise: Secondary | ICD-10-CM

## 2014-04-01 DIAGNOSIS — E871 Hypo-osmolality and hyponatremia: Secondary | ICD-10-CM

## 2014-04-01 NOTE — Assessment & Plan Note (Signed)
Memory deficits noted.

## 2014-04-01 NOTE — Assessment & Plan Note (Signed)
Mild last Na 134 02/02/14. Update BMP

## 2014-04-01 NOTE — Assessment & Plan Note (Addendum)
Will update CBC, BMP, BNP. Multiple factorials. Dc Clonazepam 0.5mg  bid.

## 2014-04-01 NOTE — Assessment & Plan Note (Signed)
Stable, takes Senna I qhs.    

## 2014-04-01 NOTE — Progress Notes (Signed)
Patient ID: Jenny [name], female   DOB: 04/01/1927, 80 y.o.   MRN: [id number]   Code Status: Full Code MOST  Allergies  Allergen Reactions  . Levaquin [Levofloxacin In D5w] Hives  . Penicillins Rash    Chief Complaint  Patient presents with  . Medical Management of Chronic Issues  . Acute Visit    fatigue/malaise    HPI: Patient is a 80 y.o. female seen in the AL at [facility] today for evaluation of c/o malaise/fatigue and chronic medical conditions.   Hospitalized 11/15/13-11/20/13 for PNA.    Problem List Items Addressed This Visit   Anemia     Last Hgb 10.1 02/02/14. Takes Fe 325 mg daily    Constipation     Stable, takes Senna I qhs.    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    Depressive disorder (Chronic)     F/u Psychiatrist, currently on Zoloft 25mg  and Clonazepam 0.25mg  bid and 0.25mg  q4hr prn, Lamictal 200mg  daily, Lorazepam 0.5mg  q4h prn. off  Abilify andTrazodone. Stable.      Edema (Chronic)     trace only. Continue Furosemide 20mg  bid and Kcl 10meq bid. Obtain BMP and BNP    Fatigue - Primary     Will update CBC, BMP, BNP. Multiple factorials. Dc Clonazepam 0.5mg  bid.     GERD (gastroesophageal reflux disease)     Stable, continue Omeprazole 20mg  daily. MBSS unremarkable.      Hyponatremia     Mild last Na 134 02/02/14. Update BMP    Hypothyroidism (Chronic)     Takes Levothyroxine 19mcg daily. Last TSH 0.720 02/02/14       Review of Systems:  Review of Systems  Constitutional: Positive for malaise/fatigue. Negative for fever, chills, weight loss and diaphoresis.       Generalized weakness  HENT: Positive for hearing loss. Negative for congestion, ear pain and sore throat.   Eyes: Negative for pain, discharge and redness.  Respiratory: Negative for cough, hemoptysis, sputum production, shortness of breath and wheezing.   Cardiovascular: Positive for leg  swelling. Negative for chest pain, palpitations, orthopnea, claudication and PND.       Trace BLE  Gastrointestinal: Negative for heartburn, nausea, vomiting, abdominal pain, diarrhea, constipation and blood in stool.       Difficulty swallowing.   Genitourinary: Positive for frequency. Negative for dysuria, urgency, hematuria and flank pain.  Musculoskeletal: Positive for joint pain. Negative for back pain, falls, myalgias and neck pain.  Skin: Negative for itching and rash.  Neurological: Positive for weakness. Negative for dizziness, tingling, tremors, sensory change, speech change, focal weakness, seizures, loss of consciousness and headaches.  Endo/Heme/Allergies: Negative for environmental allergies and polydipsia. Does not bruise/bleed easily.  Psychiatric/Behavioral: Positive for depression and memory loss. Negative for hallucinations. The patient is nervous/anxious. The patient does not have insomnia.      Past Medical History  Diagnosis Date  . Hypertension   . Coronary artery disease   . Heart murmur   . Hypothyroidism 05-02-12    tx. Synthroid  . Headache(784.0) 05-02-12    past hx. migrianes-many yrs ago  . Arthritis 05-02-12    osteoarthritis-rt. hip  . Pneumonia 05-02-12    mild case  . 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 Acute upper respiratory infections of unspecified site 2014  . Unspecified venous (peripheral) insufficiency 2013  . Senile osteoporosis 2013  . Unspecified closed fracture of pelvis 10/13/2012  .  Cellulitis and abscess of leg, except foot 2013  . Altered mental status 2013  . Chronic pain syndrome 2013  . Urinary tract infection, site not specified   . Hyposmolality and/or hyponatremia 05/2012  . Congestive heart failure, unspecified 05/2012  . Pneumonia, organism unspecified 05/2012  . Pressure ulcer, buttock(707.05) 05/2012  . Hypoxemia 05/2012  . Reflux esophagitis 3013  . Unspecified constipation 2013  . Acute posthemorrhagic anemia 05/2012  .  Coronary atherosclerosis of native coronary artery 05/2012  . Pain in joint, pelvic region and thigh 04/2012  . Dysphagia, unspecified(787.20) 03/2012  . Benign neoplasm of adrenal gland 10/2011  . Alzheimer's disease 06/2011  . Other seborrheic keratosis 2012  . Hypertensive renal disease, benign 2012  . Pain in joint, lower leg 2012  . Memory loss 09/2010  . Spinal stenosis, lumbar region, without neurogenic claudication 2011  . Unspecified vitamin D deficiency 2010  . Unspecified disorder of kidney and ureter 2010  . Edema 2010  . Other malaise and fatigue 2009  . Congenital factor VIII disorder 2009  . Senile dementia with delusional features 2008  . Generalized hyperhidrosis 2007  . Irritable bowel syndrome 2007  . Unspecified chronic bronchitis 2006  . Depression 06/2005     depression only,tx. oral meds  . Shortness of breath 2005  . Anxiety 2004  . Unspecified late effects of cerebrovascular disease 2004  . Migraine with aura, without mention of intractable migraine without mention of status migrainosus 2003  . Tinnitus 2003  . Other symptoms involving cardiovascular system 2003  . Hyperlipidemia 2002   Past Surgical History  Procedure Laterality Date  . Tonsillectomy    . Tubal ligation  05-02-12  .  tummy tuck  05-02-12    many yrs ago  . Cataract extraction, bilateral  05-02-12    bilateral  . Retinal detachment surgery  05-02-12    left  . Total hip arthroplasty  05/09/2012    Procedure: TOTAL HIP ARTHROPLASTY ANTERIOR APPROACH;  Surgeon: Mcarthur Rossetti, MD;  Location: [facility]S;  Service: Orthopedics;  Laterality: Right;  . Dental surgery  08/11/13    3 teeth surgical removed by Dr. Lewanda Rife   Social History:   reports that she quit smoking about 5 years ago. Her smoking use included Cigarettes. She has a 62 pack-year smoking history. She has never used smokeless tobacco. She reports that she does not drink alcohol or use illicit drugs.  Family History  Problem  Relation Age of Onset  . Diabetes Father     Medications: Patient's Medications  New Prescriptions   No medications on file  Previous Medications   ACETAMINOPHEN (TYLENOL) 325 MG TABLET    Take 325 mg by mouth daily as needed for mild pain. One daily as needed   ASCORBIC ACID (VITAMIN C) 1000 MG TABLET    Take 1,000 mg by mouth daily with breakfast.   ASPIRIN 325 MG TABLET    Take 1 tablet (325 mg total) by mouth daily.   BRIMONIDINE-TIMOLOL (COMBIGAN) 0.2-0.5 % OPHTHALMIC SOLUTION    Place 1 drop into both eyes every 12 (twelve) hours.    CALCITONIN, SALMON, (MIACALCIN/FORTICAL) 200 UNIT/ACT NASAL SPRAY    Place 1 spray into alternate nostrils daily. Use one spray in one nostril once daily. Alternate nostrils daily   CALCIUM-VITAMIN D (OSCAL WITH D) 500-200 MG-UNIT PER TABLET    Take 1 tablet by mouth 3 (three) times daily. Take one tablet three times daily for calcium supplementation  CLONAZEPAM (KLONOPIN) 0.5 MG TABLET    Take 0.25 mg by mouth 3 (three) times daily. 0.5mg  qhs and 0.25mg  q4h prn   FUROSEMIDE (LASIX) 40 MG TABLET    Take 0.5 tablets (20 mg total) by mouth 2 (two) times daily. Take one tablet twice daily to prevent fluid retention.   LAMOTRIGINE (LAMICTAL) 200 MG TABLET    Take 200 mg by mouth daily.   LEVOTHYROXINE (SYNTHROID, LEVOTHROID) 88 MCG TABLET    Take 88 mcg by mouth daily before breakfast.   MULTIVITAMIN-LUTEIN (OCUVITE-LUTEIN) CAPS    Take 1 capsule by mouth daily.   NAPROXEN SODIUM (ANAPROX) 220 MG TABLET    Take 220 mg by mouth daily. Take one daily   OMEPRAZOLE (PRILOSEC) 20 MG CAPSULE    Take 20 mg by mouth 2 (two) times daily before a meal.   POTASSIUM CHLORIDE SA (K-DUR,KLOR-CON) 20 MEQ TABLET    Take 20 mEq by mouth 2 (two) times daily.   SENNA (SENOKOT) 8.6 MG TABS    Take 2 tablets by mouth at bedtime.   SERTRALINE (ZOLOFT) 25 MG TABLET    Take 1 tablet (25 mg total) by mouth daily.  Modified Medications   No medications on file  Discontinued  Medications   No medications on file     Physical Exam: Physical Exam  Constitutional:  Elderly. Frail. C/o fatigue. Ambulates with walker.   HENT:  Mouth/Throat: Mucous membranes are dry. No oral lesions. No uvula swelling. No oropharyngeal exudate, posterior oropharyngeal edema, posterior oropharyngeal erythema or tonsillar abscesses.   External auditory canals normal. Partial hearing loss bilaterally.  Eyes: EOM are normal. Pupils are equal, round, and reactive to light.  Wears corrective lenses.  Neck: Neck supple. No JVD present. No tracheal deviation present. No thyromegaly present.  Cardiovascular: Normal rate and intact distal pulses.  Exam reveals no gallop and no friction rub.   Murmur heard.  Systolic murmur is present with a grade of 3/6  Pulmonary/Chest: Effort normal. No respiratory distress. She has no wheezes. She has rales. She exhibits no tenderness.  Abdominal: Soft. Bowel sounds are normal. She exhibits no distension and no mass. There is no tenderness.  Musculoskeletal: She exhibits edema. She exhibits no tenderness.  Limited range of motion of the right shoulder. Trace edema BLE  Lymphadenopathy:    She has no cervical adenopathy.  Neurological:  Conversational, but there is a significant memory deficit.  Skin:  Multiple senile ecchymoses, otherwise normal  Psychiatric: She has a normal mood and affect. Her behavior is normal.    Filed Vitals:   04/01/14 1009  BP: 146/86  Pulse: 64  Temp: 98.4 F (36.9 C)  TempSrc: Tympanic  Resp: 20   Labs reviewed: Basic Metabolic Panel:  Recent Labs  11/19/13 0325 11/19/13 0515 11/20/13 0534 11/26/13 01/19/14 02/02/14  NA 126* 125* 125* 134* 136* 134*  K 4.2 4.0 3.6* 4.9 4.6 5.2  CL 86* 86* 88*  --   --   --   CO2 24 22 23   --   --   --   GLUCOSE 111* 130* 109*  --   --   --   BUN 20 19 18 12 15 14   CREATININE 1.02 0.97 0.96 1.1 1.0 1.0  CALCIUM 8.8 8.5 8.3*  --   --   --   PHOS  --   --  2.8  --   --    --   TSH  --   --   --   --   --  0.72   Liver Function Tests:  Recent Labs  11/15/13 0500 11/16/13 0502 11/20/13 0534 01/19/14 02/02/14  AST 15 18  --  18 15  ALT 13 13  --  11 9  ALKPHOS 83 74  --  67 69  BILITOT <0.2* <0.2*  --   --   --   PROT 6.7 6.0  --   --   --   ALBUMIN 2.7* 2.2* 2.3*  --   --    CBC:  Recent Labs  06/15/13 1531 11/15/13 0500  11/19/13 0325 11/19/13 0515 11/20/13 0534 11/26/13 01/19/14 02/02/14  WBC 9.9 17.3*  < > 13.5* 12.5* 10.0 7.4 5.8 6.2  NEUTROABS 5.6 13.0*  --   --   --   --   --   --   --   HGB 11.8* 8.0*  < > 10.0* 9.8* 9.3* 9.4* 10.8* 10.1*  HCT 35.7* 24.1*  < > 30.2* 30.2* 27.0* 30* 34* 31*  MCV 83.1 70.5*  < > 75.1* 75.3* 74.4*  --   --   --   PLT 469.0* 552*  < > 494* 461* 427* 544* 517* 583*  < > = values in this interval not displayed.  Past Procedures:  11/15/2013 CLINICAL DATA: Cough. Evaluate for pneumonia versus edema. EXAM: CHEST - IMPRESSION: Persistent left lower lobe airspace disease suspicious for pneumonia. No significant pleural effusion.   11/15/2013 CLINICAL DATA: Chest pain EXAM: PORTABLE CHEST - IMPRESSION: Left lower lobe infiltrate, worrisome for pneumonia.   Assessment/Plan Hypothyroidism Takes Levothyroxine 29mcg daily. Last TSH 0.720 02/02/14  Hyponatremia Mild last Na 134 02/02/14. Update BMP  Anemia Last Hgb 10.1 02/02/14. Takes Fe 325 mg daily  Edema trace only. Continue Furosemide 20mg  bid and Kcl 26meq bid. Obtain BMP and BNP  GERD (gastroesophageal reflux disease) Stable, continue Omeprazole 20mg  daily. MBSS unremarkable.    Depressive disorder F/u Psychiatrist, currently on Zoloft 25mg  and Clonazepam 0.25mg  bid and 0.25mg  q4hr prn, Lamictal 200mg  daily, Lorazepam 0.5mg  q4h prn. off  Abilify andTrazodone. Stable.    Constipation Stable, takes Senna I qhs.  Dementia in conditions classified elsewhere without behavioral disturbance(294.10) Memory deficits noted.  Fatigue Will update CBC, BMP,  BNP. Multiple factorials. Dc Clonazepam 0.5mg  bid.     Family/ Staff Communication: observe the patient  Goals of Care: AL  Labs/tests ordered: BNP, BMP, CBC

## 2014-04-01 NOTE — Assessment & Plan Note (Signed)
trace only. Continue Furosemide 20mg  bid and Kcl 79meq bid. Obtain BMP and BNP

## 2014-04-01 NOTE — Assessment & Plan Note (Signed)
F/u Psychiatrist, currently on Zoloft 25mg  and Clonazepam 0.25mg  bid and 0.25mg  q4hr prn, Lamictal 200mg  daily, Lorazepam 0.5mg  q4h prn. off  Abilify andTrazodone. Stable.

## 2014-04-01 NOTE — Assessment & Plan Note (Signed)
Last Hgb 10.1 02/02/14. Takes Fe 325 mg daily

## 2014-04-01 NOTE — Assessment & Plan Note (Signed)
Takes Levothyroxine 26mcg daily. Last TSH 0.720 02/02/14

## 2014-04-01 NOTE — Assessment & Plan Note (Signed)
Stable, continue Omeprazole 20mg daily. MBSS unremarkable.    

## 2014-04-07 ENCOUNTER — Other Ambulatory Visit: Payer: Self-pay | Admitting: Nurse Practitioner

## 2014-04-07 ENCOUNTER — Non-Acute Institutional Stay: Payer: Medicare Other | Admitting: Nurse Practitioner

## 2014-04-07 ENCOUNTER — Encounter: Payer: Self-pay | Admitting: Nurse Practitioner

## 2014-04-07 DIAGNOSIS — E039 Hypothyroidism, unspecified: Secondary | ICD-10-CM

## 2014-04-07 DIAGNOSIS — K5900 Constipation, unspecified: Secondary | ICD-10-CM

## 2014-04-07 DIAGNOSIS — D649 Anemia, unspecified: Secondary | ICD-10-CM

## 2014-04-07 DIAGNOSIS — E871 Hypo-osmolality and hyponatremia: Secondary | ICD-10-CM

## 2014-04-07 DIAGNOSIS — I509 Heart failure, unspecified: Secondary | ICD-10-CM

## 2014-04-07 DIAGNOSIS — K219 Gastro-esophageal reflux disease without esophagitis: Secondary | ICD-10-CM

## 2014-04-07 DIAGNOSIS — F3160 Bipolar disorder, current episode mixed, unspecified: Secondary | ICD-10-CM

## 2014-04-07 DIAGNOSIS — J42 Unspecified chronic bronchitis: Secondary | ICD-10-CM

## 2014-04-07 LAB — CBC AND DIFFERENTIAL
HCT: 28 % — AB (ref 36–46)
HEMOGLOBIN: 9.1 g/dL — AB (ref 12.0–16.0)
PLATELETS: 476 10*3/uL — AB (ref 150–399)
WBC: 11.6 10*3/mL

## 2014-04-07 NOTE — Assessment & Plan Note (Signed)
Worse. Hgb 8.9 04/01/14. Increase Fe to bid. Update CBC in one week. No active bleed.

## 2014-04-07 NOTE — Assessment & Plan Note (Signed)
F/u Psychiatrist, currently on Zoloft 25mg  and Clonazepam 0.25mg  q4hr prn, Lamictal 200mg  daily, Lorazepam 0.5mg  q4h prn. off  Abilify andTrazodone. Stable.

## 2014-04-07 NOTE — Assessment & Plan Note (Signed)
Takes Levothyroxine 8mcg daily. Last TSH 0.720 02/02/14 

## 2014-04-07 NOTE — Assessment & Plan Note (Signed)
Stable, continue Omeprazole 20mg daily. MBSS unremarkable.    

## 2014-04-07 NOTE — Progress Notes (Signed)
Patient ID: Jenny [name], female   DOB: October 13, 1927, 80 y.o.   MRN: [id number]   Code Status: Full Code MOST  Allergies  Allergen Reactions  . Levaquin [Levofloxacin In D5w] Hives  . Penicillins Rash    Chief Complaint  Patient presents with  . Medical Management of Chronic Issues  . Acute Visit    CHF, hyponatremia, anemia, Pneumonitis.    HPI: Patient is a 80 y.o. female seen in the AL at [facility] today for evaluation of CHF, hyponatremia, anemia, pneumonitis,  and chronic medical conditions.   Hospitalized 11/15/13-11/20/13 for PNA.    Problem List Items Addressed This Visit   Anemia     Worse. Hgb 8.9 04/01/14. Increase Fe to bid. Update CBC in one week. No active bleed.     Relevant Medications      ferrous sulfate 325 (65 FE) MG tablet   Bipolar I disorder, most recent episode mixed     F/u Psychiatrist, currently on Zoloft 25mg  and Clonazepam 0.25mg  q4hr prn, Lamictal 200mg  daily, Lorazepam 0.5mg  q4h prn. off  Abilify andTrazodone. Stable.      Bronchitis, chronic     Vs pneumonitis vs CHF: will complete 10 day course of Ceftiin 500mg  bid and prn Neb q6hr. Increase Furosemide to 40mg  bid x 3 days. Repeat BMP and BNP. Observe the patient.     Congestive heart failure, unspecified - Primary     BNP 701.7 04/06/14. Will increase Furosemide to 40mg  bid x 3 days then 20mg  bid as prior. Update BMP and BNP. Obtain echocardiogram     Constipation     Stable, takes Senna I qhs    GERD (gastroesophageal reflux disease)     Stable, continue Omeprazole 20mg  daily. MBSS unremarkable.      Hyponatremia     Worse. CHF and Pneumonitis and psychotropic meds all contribute to the problem. Will increase Furosemide to 40mg  bid. Repeat BMP.     Hypothyroidism (Chronic)     Takes Levothyroxine 48mcg daily. Last TSH 0.720 02/02/14        Review of Systems:  Review of Systems  Constitutional: Positive for malaise/fatigue. Negative for fever, chills, weight loss and  diaphoresis.       Generalized weakness  HENT: Positive for hearing loss. Negative for congestion, ear pain and sore throat.   Eyes: Negative for pain, discharge and redness.  Respiratory: Negative for cough, hemoptysis, sputum production, shortness of breath and wheezing.   Cardiovascular: Positive for leg swelling. Negative for chest pain, palpitations, orthopnea, claudication and PND.       Trace BLE  Gastrointestinal: Negative for heartburn, nausea, vomiting, abdominal pain, diarrhea, constipation and blood in stool.       Difficulty swallowing.   Genitourinary: Positive for frequency. Negative for dysuria, urgency, hematuria and flank pain.  Musculoskeletal: Positive for joint pain. Negative for back pain, falls, myalgias and neck pain.  Skin: Negative for itching and rash.  Neurological: Positive for weakness. Negative for dizziness, tingling, tremors, sensory change, speech change, focal weakness, seizures, loss of consciousness and headaches.  Endo/Heme/Allergies: Negative for environmental allergies and polydipsia. Does not bruise/bleed easily.  Psychiatric/Behavioral: Positive for depression and memory loss. Negative for hallucinations. The patient is nervous/anxious. The patient does not have insomnia.      Past Medical History  Diagnosis Date  . Hypertension   . Coronary artery disease   . Heart murmur   . Hypothyroidism 05-02-12    tx. Synthroid  . [id number]) 05-02-12  past hx. migrianes-many yrs ago  . Arthritis 05-02-12    osteoarthritis-rt. hip  . Pneumonia 05-02-12    mild case  . 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 Acute upper respiratory infections of unspecified site 2014  . Unspecified venous (peripheral) insufficiency 2013  . Senile osteoporosis 2013  . Unspecified closed fracture of pelvis 10/13/2012  . Cellulitis and abscess of leg, except foot 2013  . Altered mental status 2013  . Chronic pain syndrome 2013  . Urinary tract infection, site not specified     . Hyposmolality and/or hyponatremia 05/2012  . Congestive heart failure, unspecified 05/2012  . Pneumonia, organism unspecified 05/2012  . Pressure ulcer, buttock(707.05) 05/2012  . Hypoxemia 05/2012  . Reflux esophagitis 3013  . Unspecified constipation 2013  . Acute posthemorrhagic anemia 05/2012  . Coronary atherosclerosis of native coronary artery 05/2012  . Pain in joint, pelvic region and thigh 04/2012  . Dysphagia, unspecified(787.20) 03/2012  . Benign neoplasm of adrenal gland 10/2011  . Alzheimer's disease 06/2011  . Other seborrheic keratosis 2012  . Hypertensive renal disease, benign 2012  . Pain in joint, lower leg 2012  . Memory loss 09/2010  . Spinal stenosis, lumbar region, without neurogenic claudication 2011  . Unspecified vitamin D deficiency 2010  . Unspecified disorder of kidney and ureter 2010  . Edema 2010  . Other malaise and fatigue 2009  . Congenital factor VIII disorder 2009  . Senile dementia with delusional features 2008  . Generalized hyperhidrosis 2007  . Irritable bowel syndrome 2007  . Unspecified chronic bronchitis 2006  . Depression 06/2005     depression only,tx. oral meds  . Shortness of breath 2005  . Anxiety 2004  . Unspecified late effects of cerebrovascular disease 2004  . Migraine with aura, without mention of intractable migraine without mention of status migrainosus 2003  . Tinnitus 2003  . Other symptoms involving cardiovascular system 2003  . Hyperlipidemia 2002   Past Surgical History  Procedure Laterality Date  . Tonsillectomy    . Tubal ligation  05-02-12  .  tummy tuck  05-02-12    many yrs ago  . Cataract extraction, bilateral  05-02-12    bilateral  . Retinal detachment surgery  05-02-12    left  . Total hip arthroplasty  05/09/2012    Procedure: TOTAL HIP ARTHROPLASTY ANTERIOR APPROACH;  Surgeon: Mcarthur Rossetti, MD;  Location: [facility];  Service: Orthopedics;  Laterality: Right;  . Dental surgery  08/11/13    3 teeth  surgical removed by Dr. Lewanda Rife   Social History:   reports that she quit smoking about 5 years ago. Her smoking use included Cigarettes. She has a 62 pack-year smoking history. She has never used smokeless tobacco. She reports that she does not drink alcohol or use illicit drugs.  Family History  Problem Relation Age of Onset  . Diabetes Father     Medications: Patient's Medications  New Prescriptions   No medications on file  Previous Medications   ACETAMINOPHEN (TYLENOL) 325 MG TABLET    Take 325 mg by mouth daily as needed for mild pain. One daily as needed   ASCORBIC ACID (VITAMIN C) 1000 MG TABLET    Take 1,000 mg by mouth daily with breakfast.   ASPIRIN 325 MG TABLET    Take 1 tablet (325 mg total) by mouth daily.   BRIMONIDINE-TIMOLOL (COMBIGAN) 0.2-0.5 % OPHTHALMIC SOLUTION    Place 1 drop into both eyes every 12 (twelve) hours.    CALCITONIN,  SALMON, (MIACALCIN/FORTICAL) 200 UNIT/ACT NASAL SPRAY    Place 1 spray into alternate nostrils daily. Use one spray in one nostril once daily. Alternate nostrils daily   CALCIUM-VITAMIN D (OSCAL WITH D) 500-200 MG-UNIT PER TABLET    Take 1 tablet by mouth 3 (three) times daily. Take one tablet three times daily for calcium supplementation   CLONAZEPAM (KLONOPIN) 0.5 MG TABLET    Take 0.25 mg by mouth 3 (three) times daily. 0.5mg  qhs and 0.25mg  q4h prn   FERROUS SULFATE 325 (65 FE) MG TABLET    Take 325 mg by mouth 2 (two) times daily with a meal.   FUROSEMIDE (LASIX) 40 MG TABLET    Take 0.5 tablets (20 mg total) by mouth 2 (two) times daily. Take one tablet twice daily to prevent fluid retention.   LAMOTRIGINE (LAMICTAL) 200 MG TABLET    Take 200 mg by mouth daily.   LEVOTHYROXINE (SYNTHROID, LEVOTHROID) 88 MCG TABLET    Take 88 mcg by mouth daily before breakfast.   MULTIVITAMIN-LUTEIN (OCUVITE-LUTEIN) CAPS    Take 1 capsule by mouth daily.   NAPROXEN SODIUM (ANAPROX) 220 MG TABLET    Take 220 mg by mouth daily. Take one daily    OMEPRAZOLE (PRILOSEC) 20 MG CAPSULE    Take 20 mg by mouth 2 (two) times daily before a meal.   POTASSIUM CHLORIDE SA (K-DUR,KLOR-CON) 20 MEQ TABLET    Take 20 mEq by mouth 2 (two) times daily.   SENNA (SENOKOT) 8.6 MG TABS    Take 2 tablets by mouth at bedtime.   SERTRALINE (ZOLOFT) 25 MG TABLET    Take 1 tablet (25 mg total) by mouth daily.  Modified Medications   No medications on file  Discontinued Medications   No medications on file     Physical Exam: Physical Exam  Constitutional:  Elderly. Frail. C/o fatigue. Ambulates with walker.   HENT:  Mouth/Throat: Mucous membranes are dry. No oral lesions. No uvula swelling. No oropharyngeal exudate, posterior oropharyngeal edema, posterior oropharyngeal erythema or tonsillar abscesses.   External auditory canals normal. Partial hearing loss bilaterally.  Eyes: EOM are normal. Pupils are equal, round, and reactive to light.  Wears corrective lenses.  Neck: Neck supple. No JVD present. No tracheal deviation present. No thyromegaly present.  Cardiovascular: Normal rate and intact distal pulses.  Exam reveals no gallop and no friction rub.   Murmur heard.  Systolic murmur is present with a grade of 3/6  Pulmonary/Chest: Effort normal. No respiratory distress. She has no wheezes. She has rales. She exhibits no tenderness.  Abdominal: Soft. Bowel sounds are normal. She exhibits no distension and no mass. There is no tenderness.  Musculoskeletal: She exhibits edema. She exhibits no tenderness.  Limited range of motion of the right shoulder. Trace edema BLE  Lymphadenopathy:    She has no cervical adenopathy.  Neurological:  Conversational, but there is a significant memory deficit.  Skin:  Multiple senile ecchymoses, otherwise normal  Psychiatric: She has a normal mood and affect. Her behavior is normal.    Filed Vitals:   04/07/14 1013  BP: 140/81  Pulse: 68  Temp: 96.4 F (35.8 C)  TempSrc: Tympanic  Resp: 14   Labs  reviewed: Basic Metabolic Panel:  Recent Labs  11/19/13 0325 11/19/13 0515 11/20/13 0534 11/26/13 01/19/14 02/02/14  NA 126* 125* 125* 134* 136* 134*  K 4.2 4.0 3.6* 4.9 4.6 5.2  CL 86* 86* 88*  --   --   --  CO2 24 22 23   --   --   --   GLUCOSE 111* 130* 109*  --   --   --   BUN 20 19 18 12 15 14   CREATININE 1.02 0.97 0.96 1.1 1.0 1.0  CALCIUM 8.8 8.5 8.3*  --   --   --   PHOS  --   --  2.8  --   --   --   TSH  --   --   --   --   --  0.72   Liver Function Tests:  Recent Labs  11/15/13 0500 11/16/13 0502 11/20/13 0534 01/19/14 02/02/14  AST 15 18  --  18 15  ALT 13 13  --  11 9  ALKPHOS 83 74  --  67 69  BILITOT <0.2* <0.2*  --   --   --   PROT 6.7 6.0  --   --   --   ALBUMIN 2.7* 2.2* 2.3*  --   --    CBC:  Recent Labs  06/15/13 1531 11/15/13 0500  11/19/13 0325 11/19/13 0515 11/20/13 0534 11/26/13 01/19/14 02/02/14  WBC 9.9 17.3*  < > 13.5* 12.5* 10.0 7.4 5.8 6.2  NEUTROABS 5.6 13.0*  --   --   --   --   --   --   --   HGB 11.8* 8.0*  < > 10.0* 9.8* 9.3* 9.4* 10.8* 10.1*  HCT 35.7* 24.1*  < > 30.2* 30.2* 27.0* 30* 34* 31*  MCV 83.1 70.5*  < > 75.1* 75.3* 74.4*  --   --   --   PLT 469.0* 552*  < > 494* 461* 427* 544* 517* 583*  < > = values in this interval not displayed.  Past Procedures:  11/15/2013 CLINICAL DATA: Cough. Evaluate for pneumonia versus edema. EXAM: CHEST - IMPRESSION: Persistent left lower lobe airspace disease suspicious for pneumonia. No significant pleural effusion.   11/15/2013 CLINICAL DATA: Chest pain EXAM: PORTABLE CHEST - IMPRESSION: Left lower lobe infiltrate, worrisome for pneumonia.   04/03/14 CXR improving bilateral pneumonitis.   Assessment/Plan Congestive heart failure, unspecified BNP 701.7 04/06/14. Will increase Furosemide to 40mg  bid x 3 days then 20mg  bid as prior. Update BMP and BNP. Obtain echocardiogram   Anemia Worse. Hgb 8.9 04/01/14. Increase Fe to bid. Update CBC in one week. No active bleed.   Bronchitis,  chronic Vs pneumonitis vs CHF: will complete 10 day course of Ceftiin 500mg  bid and prn Neb q6hr. Increase Furosemide to 40mg  bid x 3 days. Repeat BMP and BNP. Observe the patient.   Hyponatremia Worse. CHF and Pneumonitis and psychotropic meds all contribute to the problem. Will increase Furosemide to 40mg  bid. Repeat BMP.   Bipolar I disorder, most recent episode mixed F/u Psychiatrist, currently on Zoloft 25mg  and Clonazepam 0.25mg  q4hr prn, Lamictal 200mg  daily, Lorazepam 0.5mg  q4h prn. off  Abilify andTrazodone. Stable.    Constipation Stable, takes Senna I qhs  GERD (gastroesophageal reflux disease) Stable, continue Omeprazole 20mg  daily. MBSS unremarkable.    Hypothyroidism Takes Levothyroxine 19mcg daily. Last TSH 0.720 02/02/14     Family/ Staff Communication: observe the patient  Goals of Care: AL  Labs/tests ordered: BMP, BNP, echocardiogram. CBC in one week.

## 2014-04-07 NOTE — Assessment & Plan Note (Signed)
Vs pneumonitis vs CHF: will complete 10 day course of Ceftiin 500mg  bid and prn Neb q6hr. Increase Furosemide to 40mg  bid x 3 days. Repeat BMP and BNP. Observe the patient.

## 2014-04-07 NOTE — Assessment & Plan Note (Signed)
BNP 701.7 04/06/14. Will increase Furosemide to 40mg  bid x 3 days then 20mg  bid as prior. Update BMP and BNP. Obtain echocardiogram

## 2014-04-07 NOTE — Assessment & Plan Note (Signed)
Stable, takes Senna I qhs.    

## 2014-04-07 NOTE — Assessment & Plan Note (Signed)
Worse. CHF and Pneumonitis and psychotropic meds all contribute to the problem. Will increase Furosemide to 40mg  bid. Repeat BMP.

## 2014-04-08 LAB — BASIC METABOLIC PANEL
BUN: 15 mg/dL (ref 4–21)
CREATININE: 1.1 mg/dL (ref 0.5–1.1)
Potassium: 4.6 mmol/L (ref 3.4–5.3)
SODIUM: 124 mmol/L — AB (ref 137–147)

## 2014-04-13 ENCOUNTER — Encounter (HOSPITAL_COMMUNITY): Payer: Self-pay | Admitting: Emergency Medicine

## 2014-04-13 ENCOUNTER — Inpatient Hospital Stay (HOSPITAL_COMMUNITY)
Admission: EM | Admit: 2014-04-13 | Discharge: 2014-04-26 | DRG: 280 | Disposition: A | Discharge status: Skilled Nursing Facility | Payer: Medicare Other | Attending: Internal Medicine | Admitting: Internal Medicine

## 2014-04-13 ENCOUNTER — Emergency Department (HOSPITAL_COMMUNITY): Payer: Medicare Other

## 2014-04-13 DIAGNOSIS — I472 Ventricular tachycardia, unspecified: Secondary | ICD-10-CM | POA: Diagnosis present

## 2014-04-13 DIAGNOSIS — D649 Anemia, unspecified: Secondary | ICD-10-CM

## 2014-04-13 DIAGNOSIS — E872 Acidosis, unspecified: Secondary | ICD-10-CM | POA: Diagnosis present

## 2014-04-13 DIAGNOSIS — J42 Unspecified chronic bronchitis: Secondary | ICD-10-CM

## 2014-04-13 DIAGNOSIS — R9439 Abnormal result of other cardiovascular function study: Secondary | ICD-10-CM | POA: Diagnosis present

## 2014-04-13 DIAGNOSIS — D126 Benign neoplasm of colon, unspecified: Secondary | ICD-10-CM | POA: Diagnosis present

## 2014-04-13 DIAGNOSIS — R011 Cardiac murmur, unspecified: Secondary | ICD-10-CM | POA: Diagnosis present

## 2014-04-13 DIAGNOSIS — E871 Hypo-osmolality and hyponatremia: Secondary | ICD-10-CM

## 2014-04-13 DIAGNOSIS — Z880 Allergy status to penicillin: Secondary | ICD-10-CM

## 2014-04-13 DIAGNOSIS — R5381 Other malaise: Secondary | ICD-10-CM

## 2014-04-13 DIAGNOSIS — R1319 Other dysphagia: Secondary | ICD-10-CM | POA: Diagnosis present

## 2014-04-13 DIAGNOSIS — K228 Other specified diseases of esophagus: Secondary | ICD-10-CM | POA: Diagnosis present

## 2014-04-13 DIAGNOSIS — Z881 Allergy status to other antibiotic agents status: Secondary | ICD-10-CM

## 2014-04-13 DIAGNOSIS — M1990 Unspecified osteoarthritis, unspecified site: Secondary | ICD-10-CM | POA: Diagnosis present

## 2014-04-13 DIAGNOSIS — R911 Solitary pulmonary nodule: Secondary | ICD-10-CM | POA: Diagnosis present

## 2014-04-13 DIAGNOSIS — I214 Non-ST elevation (NSTEMI) myocardial infarction: Principal | ICD-10-CM

## 2014-04-13 DIAGNOSIS — K589 Irritable bowel syndrome without diarrhea: Secondary | ICD-10-CM | POA: Diagnosis present

## 2014-04-13 DIAGNOSIS — I6990 Unspecified sequelae of unspecified cerebrovascular disease: Secondary | ICD-10-CM

## 2014-04-13 DIAGNOSIS — J449 Chronic obstructive pulmonary disease, unspecified: Secondary | ICD-10-CM

## 2014-04-13 DIAGNOSIS — R931 Abnormal findings on diagnostic imaging of heart and coronary circulation: Secondary | ICD-10-CM

## 2014-04-13 DIAGNOSIS — N183 Chronic kidney disease, stage 3 unspecified: Secondary | ICD-10-CM

## 2014-04-13 DIAGNOSIS — I2510 Atherosclerotic heart disease of native coronary artery without angina pectoris: Secondary | ICD-10-CM

## 2014-04-13 DIAGNOSIS — I495 Sick sinus syndrome: Secondary | ICD-10-CM | POA: Diagnosis present

## 2014-04-13 DIAGNOSIS — Z791 Long term (current) use of non-steroidal anti-inflammatories (NSAID): Secondary | ICD-10-CM

## 2014-04-13 DIAGNOSIS — Z87891 Personal history of nicotine dependence: Secondary | ICD-10-CM

## 2014-04-13 DIAGNOSIS — I480 Paroxysmal atrial fibrillation: Secondary | ICD-10-CM

## 2014-04-13 DIAGNOSIS — R269 Unspecified abnormalities of gait and mobility: Secondary | ICD-10-CM

## 2014-04-13 DIAGNOSIS — G894 Chronic pain syndrome: Secondary | ICD-10-CM | POA: Diagnosis present

## 2014-04-13 DIAGNOSIS — D500 Iron deficiency anemia secondary to blood loss (chronic): Secondary | ICD-10-CM | POA: Diagnosis present

## 2014-04-13 DIAGNOSIS — I4891 Unspecified atrial fibrillation: Secondary | ICD-10-CM

## 2014-04-13 DIAGNOSIS — F3160 Bipolar disorder, current episode mixed, unspecified: Secondary | ICD-10-CM | POA: Diagnosis present

## 2014-04-13 DIAGNOSIS — I5033 Acute on chronic diastolic (congestive) heart failure: Secondary | ICD-10-CM | POA: Diagnosis present

## 2014-04-13 DIAGNOSIS — Z9849 Cataract extraction status, unspecified eye: Secondary | ICD-10-CM

## 2014-04-13 DIAGNOSIS — R799 Abnormal finding of blood chemistry, unspecified: Secondary | ICD-10-CM | POA: Diagnosis present

## 2014-04-13 DIAGNOSIS — K2289 Other specified disease of esophagus: Secondary | ICD-10-CM | POA: Diagnosis present

## 2014-04-13 DIAGNOSIS — D630 Anemia in neoplastic disease: Secondary | ICD-10-CM | POA: Diagnosis present

## 2014-04-13 DIAGNOSIS — F419 Anxiety disorder, unspecified: Secondary | ICD-10-CM

## 2014-04-13 DIAGNOSIS — Z833 Family history of diabetes mellitus: Secondary | ICD-10-CM

## 2014-04-13 DIAGNOSIS — I5032 Chronic diastolic (congestive) heart failure: Secondary | ICD-10-CM

## 2014-04-13 DIAGNOSIS — Z96649 Presence of unspecified artificial hip joint: Secondary | ICD-10-CM

## 2014-04-13 DIAGNOSIS — D66 Hereditary factor VIII deficiency: Secondary | ICD-10-CM | POA: Diagnosis present

## 2014-04-13 DIAGNOSIS — Z8701 Personal history of pneumonia (recurrent): Secondary | ICD-10-CM

## 2014-04-13 DIAGNOSIS — G309 Alzheimer's disease, unspecified: Secondary | ICD-10-CM | POA: Diagnosis present

## 2014-04-13 DIAGNOSIS — E785 Hyperlipidemia, unspecified: Secondary | ICD-10-CM | POA: Diagnosis present

## 2014-04-13 DIAGNOSIS — I872 Venous insufficiency (chronic) (peripheral): Secondary | ICD-10-CM | POA: Diagnosis present

## 2014-04-13 DIAGNOSIS — F411 Generalized anxiety disorder: Secondary | ICD-10-CM | POA: Diagnosis present

## 2014-04-13 DIAGNOSIS — I4729 Other ventricular tachycardia: Secondary | ICD-10-CM | POA: Diagnosis present

## 2014-04-13 DIAGNOSIS — K5900 Constipation, unspecified: Secondary | ICD-10-CM | POA: Diagnosis present

## 2014-04-13 DIAGNOSIS — Z7982 Long term (current) use of aspirin: Secondary | ICD-10-CM

## 2014-04-13 DIAGNOSIS — R1310 Dysphagia, unspecified: Secondary | ICD-10-CM | POA: Diagnosis present

## 2014-04-13 DIAGNOSIS — I959 Hypotension, unspecified: Secondary | ICD-10-CM | POA: Diagnosis not present

## 2014-04-13 DIAGNOSIS — K219 Gastro-esophageal reflux disease without esophagitis: Secondary | ICD-10-CM | POA: Diagnosis present

## 2014-04-13 DIAGNOSIS — R195 Other fecal abnormalities: Secondary | ICD-10-CM

## 2014-04-13 DIAGNOSIS — C189 Malignant neoplasm of colon, unspecified: Secondary | ICD-10-CM

## 2014-04-13 DIAGNOSIS — R933 Abnormal findings on diagnostic imaging of other parts of digestive tract: Secondary | ICD-10-CM

## 2014-04-13 DIAGNOSIS — R6889 Other general symptoms and signs: Secondary | ICD-10-CM | POA: Diagnosis present

## 2014-04-13 DIAGNOSIS — I129 Hypertensive chronic kidney disease with stage 1 through stage 4 chronic kidney disease, or unspecified chronic kidney disease: Secondary | ICD-10-CM | POA: Diagnosis present

## 2014-04-13 DIAGNOSIS — F0280 Dementia in other diseases classified elsewhere without behavioral disturbance: Secondary | ICD-10-CM | POA: Diagnosis present

## 2014-04-13 DIAGNOSIS — Z79899 Other long term (current) drug therapy: Secondary | ICD-10-CM

## 2014-04-13 DIAGNOSIS — E878 Other disorders of electrolyte and fluid balance, not elsewhere classified: Secondary | ICD-10-CM | POA: Diagnosis present

## 2014-04-13 DIAGNOSIS — F0390 Unspecified dementia without behavioral disturbance: Secondary | ICD-10-CM | POA: Diagnosis present

## 2014-04-13 DIAGNOSIS — C180 Malignant neoplasm of cecum: Secondary | ICD-10-CM | POA: Diagnosis present

## 2014-04-13 DIAGNOSIS — I509 Heart failure, unspecified: Secondary | ICD-10-CM

## 2014-04-13 DIAGNOSIS — E039 Hypothyroidism, unspecified: Secondary | ICD-10-CM | POA: Diagnosis present

## 2014-04-13 DIAGNOSIS — J189 Pneumonia, unspecified organism: Secondary | ICD-10-CM | POA: Diagnosis present

## 2014-04-13 DIAGNOSIS — K5730 Diverticulosis of large intestine without perforation or abscess without bleeding: Secondary | ICD-10-CM | POA: Diagnosis present

## 2014-04-13 HISTORY — DX: Non-ST elevation (NSTEMI) myocardial infarction: I21.4

## 2014-04-13 LAB — CBC WITH DIFFERENTIAL/PLATELET
Basophils Absolute: 0.1 10*3/uL (ref 0.0–0.1)
Basophils Relative: 1 % (ref 0–1)
EOS ABS: 0.4 10*3/uL (ref 0.0–0.7)
Eosinophils Relative: 5 % (ref 0–5)
HCT: 26.5 % — ABNORMAL LOW (ref 36.0–46.0)
HEMOGLOBIN: 9.2 g/dL — AB (ref 12.0–15.0)
LYMPHS ABS: 1.4 10*3/uL (ref 0.7–4.0)
LYMPHS PCT: 18 % (ref 12–46)
MCH: 28.4 pg (ref 26.0–34.0)
MCHC: 34.7 g/dL (ref 30.0–36.0)
MCV: 81.8 fL (ref 78.0–100.0)
MONOS PCT: 15 % — AB (ref 3–12)
Monocytes Absolute: 1.2 10*3/uL — ABNORMAL HIGH (ref 0.1–1.0)
NEUTROS PCT: 61 % (ref 43–77)
Neutro Abs: 4.8 10*3/uL (ref 1.7–7.7)
PLATELETS: 589 10*3/uL — AB (ref 150–400)
RBC: 3.24 MIL/uL — ABNORMAL LOW (ref 3.87–5.11)
RDW: 13.6 % (ref 11.5–15.5)
WBC: 7.7 10*3/uL (ref 4.0–10.5)

## 2014-04-13 LAB — URINALYSIS, ROUTINE W REFLEX MICROSCOPIC
BILIRUBIN URINE: NEGATIVE
GLUCOSE, UA: NEGATIVE mg/dL
HGB URINE DIPSTICK: NEGATIVE
KETONES UR: NEGATIVE mg/dL
Leukocytes, UA: NEGATIVE
Nitrite: NEGATIVE
Protein, ur: NEGATIVE mg/dL
Specific Gravity, Urine: 1.004 — ABNORMAL LOW (ref 1.005–1.030)
UROBILINOGEN UA: 0.2 mg/dL (ref 0.0–1.0)
pH: 5.5 (ref 5.0–8.0)

## 2014-04-13 LAB — COMPREHENSIVE METABOLIC PANEL
ALK PHOS: 92 U/L (ref 39–117)
ALT: 15 U/L (ref 0–35)
AST: 21 U/L (ref 0–37)
Albumin: 3.1 g/dL — ABNORMAL LOW (ref 3.5–5.2)
BUN: 13 mg/dL (ref 6–23)
CO2: 21 meq/L (ref 19–32)
Calcium: 8.9 mg/dL (ref 8.4–10.5)
Chloride: 84 mEq/L — ABNORMAL LOW (ref 96–112)
Creatinine, Ser: 1.03 mg/dL (ref 0.50–1.10)
GFR, EST AFRICAN AMERICAN: 56 mL/min — AB (ref >90)
GFR, EST NON AFRICAN AMERICAN: 49 mL/min — AB (ref >90)
Glucose, Bld: 107 mg/dL — ABNORMAL HIGH (ref 70–99)
POTASSIUM: 4.3 meq/L (ref 3.7–5.3)
SODIUM: 122 meq/L — AB (ref 137–147)
Total Protein: 6.4 g/dL (ref 6.0–8.3)

## 2014-04-13 LAB — BASIC METABOLIC PANEL
BUN: 12 mg/dL (ref 4–21)
Creatinine: 1 mg/dL (ref 0.5–1.1)
Glucose: 95 mg/dL
Potassium: 4.7 mmol/L (ref 3.4–5.3)
Sodium: 117 mmol/L — AB (ref 137–147)

## 2014-04-13 LAB — MRSA PCR SCREENING: MRSA by PCR: NEGATIVE

## 2014-04-13 LAB — PRO B NATRIURETIC PEPTIDE: PRO B NATRI PEPTIDE: 3909 pg/mL — AB (ref 0–450)

## 2014-04-13 LAB — TSH: TSH: 1.69 u[IU]/mL (ref 0.350–4.500)

## 2014-04-13 LAB — TROPONIN I
Troponin I: 1.76 ng/mL (ref <0.30)
Troponin I: 2.18 ng/mL (ref <0.30)

## 2014-04-13 MED ORDER — ENOXAPARIN SODIUM 40 MG/0.4ML ~~LOC~~ SOLN
40.0000 mg | SUBCUTANEOUS | Status: DC
  Administered 2014-04-13 – 2014-04-14 (×2): 40 mg via SUBCUTANEOUS
  Filled 2014-04-13 (×2): qty 0.4

## 2014-04-13 MED ORDER — DILTIAZEM LOAD VIA INFUSION
15.0000 mg | Freq: Once | INTRAVENOUS | Status: AC
  Administered 2014-04-13: 15 mg via INTRAVENOUS
  Filled 2014-04-13: qty 15

## 2014-04-13 MED ORDER — ONDANSETRON HCL 4 MG/2ML IJ SOLN
4.0000 mg | Freq: Four times a day (QID) | INTRAMUSCULAR | Status: DC | PRN
  Administered 2014-04-22 – 2014-04-24 (×3): 4 mg via INTRAVENOUS
  Filled 2014-04-13 (×3): qty 2

## 2014-04-13 MED ORDER — POTASSIUM CHLORIDE CRYS ER 20 MEQ PO TBCR
20.0000 meq | EXTENDED_RELEASE_TABLET | Freq: Two times a day (BID) | ORAL | Status: DC
  Administered 2014-04-13 – 2014-04-20 (×14): 20 meq via ORAL
  Filled 2014-04-13 (×19): qty 1

## 2014-04-13 MED ORDER — SODIUM CHLORIDE 0.9 % IV SOLN
250.0000 mL | INTRAVENOUS | Status: DC | PRN
  Administered 2014-04-13: 250 mL via INTRAVENOUS

## 2014-04-13 MED ORDER — ONDANSETRON HCL 4 MG PO TABS: 4.0000 mg | ORAL_TABLET | Freq: Four times a day (QID) | ORAL | Status: DC | PRN

## 2014-04-13 MED ORDER — DILTIAZEM HCL 25 MG/5ML IV SOLN
15.0000 mg | Freq: Once | INTRAVENOUS | Status: AC
  Administered 2014-04-13: 15 mg via INTRAVENOUS
  Filled 2014-04-13: qty 5

## 2014-04-13 MED ORDER — SACCHAROMYCES BOULARDII 250 MG PO CAPS
250.0000 mg | ORAL_CAPSULE | Freq: Two times a day (BID) | ORAL | Status: DC
  Administered 2014-04-13 – 2014-04-26 (×21): 250 mg via ORAL
  Filled 2014-04-13 (×29): qty 1

## 2014-04-13 MED ORDER — FERROUS SULFATE 325 (65 FE) MG PO TABS
325.0000 mg | ORAL_TABLET | Freq: Two times a day (BID) | ORAL | Status: DC
  Administered 2014-04-14 – 2014-04-26 (×20): 325 mg via ORAL
  Filled 2014-04-13 (×27): qty 1

## 2014-04-13 MED ORDER — ASPIRIN 81 MG PO CHEW
324.0000 mg | CHEWABLE_TABLET | Freq: Once | ORAL | Status: AC
  Administered 2014-04-13: 324 mg via ORAL
  Filled 2014-04-13: qty 4

## 2014-04-13 MED ORDER — TIMOLOL MALEATE 0.5 % OP SOLN
1.0000 [drp] | Freq: Two times a day (BID) | OPHTHALMIC | Status: DC
Start: 2014-04-13 — End: 2014-04-26
  Administered 2014-04-13 – 2014-04-26 (×24): 1 [drp] via OPHTHALMIC
  Filled 2014-04-13 (×3): qty 5

## 2014-04-13 MED ORDER — SODIUM CHLORIDE 0.9 % IJ SOLN: 3.0000 mL | INTRAMUSCULAR | Status: DC | PRN

## 2014-04-13 MED ORDER — CLONAZEPAM 0.5 MG PO TABS
0.5000 mg | ORAL_TABLET | Freq: Every day | ORAL | Status: DC
  Administered 2014-04-13 – 2014-04-25 (×13): 0.5 mg via ORAL
  Filled 2014-04-13 (×15): qty 1

## 2014-04-13 MED ORDER — PANTOPRAZOLE SODIUM 40 MG PO TBEC
40.0000 mg | DELAYED_RELEASE_TABLET | Freq: Every day | ORAL | Status: DC
  Administered 2014-04-13 – 2014-04-26 (×13): 40 mg via ORAL
  Filled 2014-04-13 (×14): qty 1

## 2014-04-13 MED ORDER — OXYCODONE HCL 5 MG PO TABS: 5.0000 mg | ORAL_TABLET | ORAL | Status: DC | PRN

## 2014-04-13 MED ORDER — DILTIAZEM HCL 100 MG IV SOLR: 5.0000 mg/h | INTRAVENOUS | Status: DC

## 2014-04-13 MED ORDER — CLONAZEPAM 0.125 MG PO TBDP
0.2500 mg | ORAL_TABLET | ORAL | Status: DC | PRN
  Administered 2014-04-14 – 2014-04-20 (×6): 0.25 mg via ORAL
  Filled 2014-04-13 (×8): qty 2

## 2014-04-13 MED ORDER — DILTIAZEM HCL 100 MG IV SOLR
5.0000 mg/h | Freq: Once | INTRAVENOUS | Status: AC
  Administered 2014-04-13: 5 mg/h via INTRAVENOUS

## 2014-04-13 MED ORDER — SENNA 8.6 MG PO TABS
2.0000 | ORAL_TABLET | Freq: Every day | ORAL | Status: DC
  Administered 2014-04-13 – 2014-04-25 (×12): 17.2 mg via ORAL
  Filled 2014-04-13 (×13): qty 2

## 2014-04-13 MED ORDER — LEVOTHYROXINE SODIUM 88 MCG PO TABS
88.0000 ug | ORAL_TABLET | Freq: Every day | ORAL | Status: DC
  Administered 2014-04-14 – 2014-04-26 (×12): 88 ug via ORAL
  Filled 2014-04-13 (×14): qty 1

## 2014-04-13 MED ORDER — ASPIRIN 325 MG PO TABS
325.0000 mg | ORAL_TABLET | Freq: Every day | ORAL | Status: DC
  Administered 2014-04-14 – 2014-04-15 (×2): 325 mg via ORAL
  Filled 2014-04-13 (×2): qty 1

## 2014-04-13 MED ORDER — FUROSEMIDE 20 MG PO TABS
20.0000 mg | ORAL_TABLET | Freq: Two times a day (BID) | ORAL | Status: DC
  Administered 2014-04-13 – 2014-04-23 (×18): 20 mg via ORAL
  Filled 2014-04-13 (×25): qty 1

## 2014-04-13 MED ORDER — NITROGLYCERIN 0.4 MG SL SUBL: 0.4000 mg | SUBLINGUAL_TABLET | SUBLINGUAL | Status: DC | PRN

## 2014-04-13 MED ORDER — LAMOTRIGINE 200 MG PO TABS
200.0000 mg | ORAL_TABLET | Freq: Every day | ORAL | Status: DC
  Administered 2014-04-14 – 2014-04-26 (×10): 200 mg via ORAL
  Filled 2014-04-13 (×13): qty 1

## 2014-04-13 MED ORDER — CALCITONIN (SALMON) 200 UNIT/ACT NA SOLN
1.0000 | Freq: Every day | NASAL | Status: DC
  Administered 2014-04-13 – 2014-04-26 (×11): 1 via NASAL
  Filled 2014-04-13 (×2): qty 3.7

## 2014-04-13 MED ORDER — BRIMONIDINE TARTRATE 0.2 % OP SOLN
1.0000 [drp] | Freq: Two times a day (BID) | OPHTHALMIC | Status: DC
  Administered 2014-04-13 – 2014-04-26 (×24): 1 [drp] via OPHTHALMIC
  Filled 2014-04-13 (×3): qty 5

## 2014-04-13 MED ORDER — ACETAMINOPHEN 325 MG PO TABS
325.0000 mg | ORAL_TABLET | Freq: Every day | ORAL | Status: DC | PRN
  Administered 2014-04-20: 23:00:00 325 mg via ORAL
  Filled 2014-04-13: qty 1

## 2014-04-13 MED ORDER — SERTRALINE HCL 25 MG PO TABS
25.0000 mg | ORAL_TABLET | Freq: Every day | ORAL | Status: DC
  Administered 2014-04-14 – 2014-04-26 (×12): 25 mg via ORAL
  Filled 2014-04-13 (×13): qty 1

## 2014-04-13 MED ORDER — SODIUM CHLORIDE 0.9 % IJ SOLN
3.0000 mL | Freq: Two times a day (BID) | INTRAMUSCULAR | Status: DC
  Administered 2014-04-14 – 2014-04-20 (×8): 3 mL via INTRAVENOUS

## 2014-04-13 MED ORDER — IPRATROPIUM-ALBUTEROL 0.5-2.5 (3) MG/3ML IN SOLN: 3.0000 mL | RESPIRATORY_TRACT | Status: DC | PRN

## 2014-04-13 MED ORDER — PILOCARPINE HCL 5 MG PO TABS
5.0000 mg | ORAL_TABLET | Freq: Three times a day (TID) | ORAL | Status: DC
  Administered 2014-04-13 – 2014-04-26 (×34): 5 mg via ORAL
  Filled 2014-04-13 (×42): qty 1

## 2014-04-13 MED ORDER — CEFUROXIME AXETIL 500 MG PO TABS
500.0000 mg | ORAL_TABLET | Freq: Two times a day (BID) | ORAL | Status: DC
  Administered 2014-04-13 – 2014-04-18 (×10): 500 mg via ORAL
  Filled 2014-04-13 (×14): qty 1

## 2014-04-13 MED ORDER — CALCIUM CARBONATE-VITAMIN D 500-200 MG-UNIT PO TABS
1.0000 | ORAL_TABLET | Freq: Three times a day (TID) | ORAL | Status: DC
  Administered 2014-04-13 – 2014-04-26 (×33): 1 via ORAL
  Filled 2014-04-13 (×43): qty 1

## 2014-04-13 MED ORDER — BRIMONIDINE TARTRATE-TIMOLOL 0.2-0.5 % OP SOLN: 1.0000 [drp] | Freq: Two times a day (BID) | OPHTHALMIC | Status: DC

## 2014-04-13 MED ORDER — MORPHINE SULFATE 2 MG/ML IJ SOLN: 1.0000 mg | INTRAMUSCULAR | Status: DC | PRN

## 2014-04-13 NOTE — Progress Notes (Signed)
Utilization Review completed.  Zeina Akkerman RN CM  

## 2014-04-13 NOTE — ED Notes (Signed)
RN notified of patient's bladder scan volume 424 cc.

## 2014-04-13 NOTE — ED Provider Notes (Addendum)
CSN: [id number]     Arrival date & time 04/13/14  1455 History   First MD Initiated Contact with Patient 04/13/14 1502     Chief Complaint  Patient presents with  . Dehydration    Hyponatremia     (Consider location/radiation/quality/duration/timing/severity/associated sxs/prior Treatment) HPI  This is an 80 year old with multiple medical problems who presents with lab abnormalities. Per the patient's daughter, the patient has been treated for pneumonia and is still on antibiotics. She has been more lethargic than normal and fatigued.  Patient reports that she is at her baseline and does not have any systemic complaints. She denies any chest pain. She states that she is short of breath but is short of breath "all the time." She denies any recent lower extremity edema. Per the daughter, the patient was found to have a low sodium yesterday was given fluid hydration. They also report that the patient had an echocardiogram over the weekend that was reportedly normal. Repeat lab work today showed a lower sodium and she was here for further evaluation. Patient is alert and oriented x2.  Of note, have reviewed the patient's lab work. Sodium of 124 on 5/28 and sodium of 117 this morning. Elevated BNP and chest x-ray with suggestion of pulmonary edema. Echocardiogram report showed a normal EF of 65%.  Past Medical History  Diagnosis Date  . Hypertension   . Coronary artery disease   . Heart murmur   . Hypothyroidism 05-02-12    tx. Synthroid  . Headache(784.0) 05-02-12    past hx. migrianes-many yrs ago  . Arthritis 05-02-12    osteoarthritis-rt. hip  . Pneumonia 05-02-12    mild case  . 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 Acute upper respiratory infections of unspecified site 2014  . Unspecified venous (peripheral) insufficiency 2013  . Senile osteoporosis 2013  . Unspecified closed fracture of pelvis 10/13/2012  . Cellulitis and abscess of leg, except foot 2013  . Altered mental status 2013  .  Chronic pain syndrome 2013  . Urinary tract infection, site not specified   . Hyposmolality and/or hyponatremia 05/2012  . Congestive heart failure, unspecified 05/2012  . Pneumonia, organism unspecified 05/2012  . Pressure ulcer, buttock(707.05) 05/2012  . Hypoxemia 05/2012  . Reflux esophagitis 3013  . Unspecified constipation 2013  . Acute posthemorrhagic anemia 05/2012  . Coronary atherosclerosis of native coronary artery 05/2012  . Pain in joint, pelvic region and thigh 04/2012  . Dysphagia, unspecified(787.20) 03/2012  . Benign neoplasm of adrenal gland 10/2011  . Alzheimer's disease 06/2011  . Other seborrheic keratosis 2012  . Hypertensive renal disease, benign 2012  . Pain in joint, lower leg 2012  . Memory loss 09/2010  . Spinal stenosis, lumbar region, without neurogenic claudication 2011  . Unspecified vitamin D deficiency 2010  . Unspecified disorder of kidney and ureter 2010  . Edema 2010  . Other malaise and fatigue 2009  . Congenital factor VIII disorder 2009  . Senile dementia with delusional features 2008  . Generalized hyperhidrosis 2007  . Irritable bowel syndrome 2007  . Unspecified chronic bronchitis 2006  . Depression 06/2005     depression only,tx. oral meds  . Shortness of breath 2005  . Anxiety 2004  . Unspecified late effects of cerebrovascular disease 2004  . Migraine with aura, without mention of intractable migraine without mention of status migrainosus 2003  . Tinnitus 2003  . Other symptoms involving cardiovascular system 2003  . Hyperlipidemia 2002   Past Surgical History  Procedure Laterality Date  . Tonsillectomy    . Tubal ligation  05-02-12  .  tummy tuck  05-02-12    many yrs ago  . Cataract extraction, bilateral  05-02-12    bilateral  . Retinal detachment surgery  05-02-12    left  . Total hip arthroplasty  05/09/2012    Procedure: TOTAL HIP ARTHROPLASTY ANTERIOR APPROACH;  Surgeon: Mcarthur Rossetti, MD;  Location: [facility];  Service:  Orthopedics;  Laterality: Right;  . Dental surgery  08/11/13    3 teeth surgical removed by Dr. Lewanda Rife   Family History  Problem Relation Age of Onset  . Diabetes Father    History  Substance Use Topics  . Smoking status: Former Smoker -- 1.00 packs/day for 62 years    Types: Cigarettes    Quit date: 10/10/2008  . Smokeless tobacco: Never Used  . Alcohol Use: No   OB History   Grav Para Term Preterm Abortions TAB SAB Ect Mult Living                 Review of Systems  Constitutional: Positive for fatigue. Negative for fever.  Respiratory: Positive for cough and shortness of breath. Negative for chest tightness.   Cardiovascular: Negative for chest pain and leg swelling.  Gastrointestinal: Negative for nausea, vomiting and abdominal pain.  Genitourinary: Negative for dysuria.  Musculoskeletal: Negative for back pain.  Skin: Negative for wound.  Neurological: Negative for headaches.  Psychiatric/Behavioral: Positive for confusion.  All other systems reviewed and are negative.     Allergies  Levaquin and Penicillins  Home Medications   Prior to Admission medications   Medication Sig Start Date End Date Taking? Authorizing Provider  acetaminophen (TYLENOL) 325 MG tablet Take 325 mg by mouth daily as needed for mild pain. One daily as needed   Yes Historical Provider, MD  albuterol (PROVENTIL HFA;VENTOLIN HFA) 108 (90 BASE) MCG/ACT inhaler Inhale 2 puffs into the lungs every 6 (six) hours as needed for wheezing or shortness of breath.   Yes Historical Provider, MD  Ascorbic Acid (VITAMIN C) 1000 MG tablet Take 1,000 mg by mouth daily with breakfast.   Yes Historical Provider, MD  aspirin 325 MG tablet Take 1 tablet (325 mg total) by mouth daily. 11/20/13  Yes Myrtis Ser, MD  brimonidine-timolol (COMBIGAN) 0.2-0.5 % ophthalmic solution Place 1 drop into both eyes every 12 (twelve) hours.    Yes Historical Provider, MD  calcitonin, salmon, (MIACALCIN/FORTICAL) 200 UNIT/ACT  nasal spray Place 1 spray into alternate nostrils daily. Use one spray in one nostril once daily. Alternate nostrils daily 07/23/13  Yes Tiffany L Reed, DO  calcium-vitamin D (OSCAL WITH D) 500-200 MG-UNIT per tablet Take 1 tablet by mouth 3 (three) times daily. Take one tablet three times daily for calcium supplementation 03/12/13  Yes Estill Dooms, MD  cefUROXime (CEFTIN) 500 MG tablet Take 500 mg by mouth 2 (two) times daily with a meal. For 10 days. 04/04/14  Yes Historical Provider, MD  clonazePAM (KLONOPIN) 0.25 MG disintegrating tablet Take 0.25 mg by mouth every 4 (four) hours as needed (breakthrough anxiety).   Yes Historical Provider, MD  clonazePAM (KLONOPIN) 0.5 MG tablet Take 0.5 mg by mouth at bedtime.   Yes Historical Provider, MD  ferrous sulfate 325 (65 FE) MG tablet Take 325 mg by mouth 2 (two) times daily with a meal.   Yes Historical Provider, MD  furosemide (LASIX) 20 MG tablet Take 20 mg by mouth 2 (two) times  daily.   Yes Historical Provider, MD  ibuprofen (ADVIL,MOTRIN) 200 MG tablet Take 400 mg by mouth 3 (three) times daily with meals.   Yes Historical Provider, MD  ipratropium-albuterol (DUONEB) 0.5-2.5 (3) MG/3ML SOLN Take 3 mLs by nebulization every 6 (six) hours as needed (cough/wheeze.). AND every 6 hours for 5 days starting 04/09/14.  (Last dose 04/14/14 at 0000.)   Yes Historical Provider, MD  lamoTRIgine (LAMICTAL) 200 MG tablet Take 200 mg by mouth daily.   Yes Historical Provider, MD  levothyroxine (SYNTHROID, LEVOTHROID) 88 MCG tablet Take 88 mcg by mouth daily before breakfast.   Yes Historical Provider, MD  LORazepam (ATIVAN) 0.5 MG tablet Take 0.5 mg by mouth every 4 (four) hours as needed for anxiety (IN THE EVENT THAT CLONAZEPAM IS UNAVAILABLE.).   Yes Historical Provider, MD  multivitamin-lutein (OCUVITE-LUTEIN) CAPS Take 1 capsule by mouth daily.   Yes Historical Provider, MD  naproxen sodium (ANAPROX) 220 MG tablet Take 220 mg by mouth daily as needed. Take one  daily   Yes Historical Provider, MD  nitroGLYCERIN (NITROSTAT) 0.4 MG SL tablet Place 0.4 mg under the tongue every 5 (five) minutes as needed for chest pain.   Yes Historical Provider, MD  omeprazole (PRILOSEC) 20 MG capsule Take 20 mg by mouth 2 (two) times daily before a meal.   Yes Historical Provider, MD  omeprazole (PRILOSEC) 40 MG capsule Take 40 mg by mouth 4 (four) times daily.   Yes Historical Provider, MD  pilocarpine (SALAGEN) 5 MG tablet Take 5 mg by mouth 3 (three) times daily.   Yes Historical Provider, MD  potassium chloride SA (K-DUR,KLOR-CON) 20 MEQ tablet Take 20 mEq by mouth 2 (two) times daily.   Yes Historical Provider, MD  saccharomyces boulardii (FLORASTOR) 250 MG capsule Take 250 mg by mouth 2 (two) times daily. For 10 days. 04/04/14  Yes Historical Provider, MD  senna (SENOKOT) 8.6 MG TABS Take 2 tablets by mouth at bedtime.   Yes Historical Provider, MD  sertraline (ZOLOFT) 25 MG tablet Take 1 tablet (25 mg total) by mouth daily. 11/20/13  Yes Myrtis Ser, MD   BP 199/91  Pulse 137  Temp(Src) 98.3 F (36.8 C) (Oral)  Resp 28  SpO2 96% Physical Exam  Nursing note and vitals reviewed. Constitutional: She appears well-developed and well-nourished. No distress.  Elderly  HENT:  Head: Normocephalic and atraumatic.  Eyes: Pupils are equal, round, and reactive to light.  Neck: Neck supple.  Cardiovascular: Normal rate and regular rhythm.   Murmur heard. Pulmonary/Chest: Effort normal and breath sounds normal. No respiratory distress. She has no wheezes. She has no rales.  Abdominal: Soft. Bowel sounds are normal. There is no tenderness. There is no rebound.  Musculoskeletal: She exhibits no edema.  Neurological: She is alert.  Oriented x2  Skin: Skin is warm and dry.  Psychiatric: She has a normal mood and affect.    ED Course  Procedures (including critical care time)  CRITICAL CARE Performed by: Merryl Hacker   Total critical care time: 45  min  Critical care time was exclusive of separately billable procedures and treating other patients.  Critical care was necessary to treat or prevent imminent or life-threatening deterioration.  Critical care was time spent personally by me on the following activities: development of treatment plan with patient and/or surrogate as well as nursing, discussions with consultants, evaluation of patient's response to treatment, examination of patient, obtaining history from patient or surrogate, ordering and performing treatments and  interventions, ordering and review of laboratory studies, ordering and review of radiographic studies, pulse oximetry and re-evaluation of patient's condition.  Labs Review Labs Reviewed  CBC WITH DIFFERENTIAL - Abnormal; Notable for the following:    RBC 3.24 (*)    Hemoglobin 9.2 (*)    HCT 26.5 (*)    Platelets 589 (*)    Monocytes Relative 15 (*)    Monocytes Absolute 1.2 (*)    All other components within normal limits  COMPREHENSIVE METABOLIC PANEL - Abnormal; Notable for the following:    Sodium 122 (*)    Chloride 84 (*)    Glucose, Bld 107 (*)    Albumin 3.1 (*)    Total Bilirubin <0.2 (*)    GFR calc non Af Amer 49 (*)    GFR calc Af Amer 56 (*)    All other components within normal limits  PRO B NATRIURETIC PEPTIDE - Abnormal; Notable for the following:    Pro B Natriuretic peptide (BNP) 3909.0 (*)    All other components within normal limits  TROPONIN I - Abnormal; Notable for the following:    Troponin I 1.76 (*)    All other components within normal limits  URINALYSIS, ROUTINE W REFLEX MICROSCOPIC - Abnormal; Notable for the following:    Specific Gravity, Urine 1.004 (*)    All other components within normal limits  SODIUM, URINE, RANDOM  CREATININE, URINE, RANDOM    Imaging Review Dg Chest 2 View  04/13/2014   CLINICAL DATA:  DEHYDRATION  EXAM: CHEST - 2 VIEW  COMPARISON:  11/15/2013  FINDINGS: Stable cardiomegaly. Improving left lower  lung airspace disease, with mild scattered residual airspace opacities. There is some residual linear scarring or subsegmental atelectasis in the left mid lung. Right lung remains clear. Atheromatous aorta. . No effusion. Degenerative changes in bilateral shoulders.  IMPRESSION: 1. Partial improvement in the left lower lung airspace disease. 2. Stable cardiomegaly   Electronically Signed   By: Arne Cleveland M.D.   On: 04/13/2014 15:56     EKG Interpretation   Date/Time:  Tuesday April 13 2014 15:01:57 EDT Ventricular Rate:  82 PR Interval:  197 QRS Duration: 96 QT Interval:  378 QTC Calculation: 441 R Axis:   88 Text Interpretation:  Sinus rhythm Borderline right axis deviation No  longer brady when compared to prior Confirmed by HORTON  MD, [place]  ([id number]) on 04/13/2014 4:53:40 PM      MDM   Final diagnoses:  Hyponatremia  NSTEMI (non-ST elevated myocardial infarction)  Atrial fibrillation with RVR    Patient presents with generalized fatigue, malaise, and ongoing shortness of breath. Recently treated for pneumonia. Labs notable at the assisted-living facility to have a sodium of 117 this morning. Patient reports that she does drink a lot of fluids. On exam she is hypertensive with blood pressures of 200/100. She denies any chest pain but does report shortness of breath which he states is at her baseline. She is 100 percent on room air.  No obvious lower extremity edema noted.  Lab work notable for sodium of 122, elevated BNP at 39 (hx of CHF).  Screening EKG without ST elevation. Given shortness of breath, troponin was sent and is elevated at 1.79. Patient continues to deny chest pain. Patient was given full dose aspirin.  Urine studies for hyponatremia sent. Patient has a history of similar presentation and was felt to have intrinsic renal dysfunction resulting in hypernatremia. She improved fluid restriction.  17:44 Cardiology consulted for  NSTEMI.  While on the phone with  cardiology, nursing noted heart rate to increase to 140s. Repeat EKG shows atrial for relation with RVR. This could be cause of patient's elevated troponin. Will work on rate control with IV diltiazem bolus which did help patient's blood pressure as well. Cardiology will evaluate.  Patient to be admitted to hospitalist.     Merryl Hacker, MD 04/13/14 1801  Merryl Hacker, MD 04/13/14 [id number]

## 2014-04-13 NOTE — ED Notes (Signed)
MD ok for patient to be transferred now that cardizem gtt is started.

## 2014-04-13 NOTE — ED Notes (Signed)
Per ems facility gave 0.25mg  of colapine at 14:24 at the patients request.

## 2014-04-13 NOTE — ED Notes (Signed)
Bed: WA08 Expected date:  Expected time:  Means of arrival:  Comments: EMS-low sodium

## 2014-04-13 NOTE — ED Notes (Signed)
Initial contact- pt diagnosed with PNA x1 week ago. Started on abx. Family unsure of antibiotic. Pt reports feeling lethargic and tired. Per family, patient does "drink lots of fluid" and sips on ice chips all day. Denies CP. Does report SOB as a regular occurrence. SpO2 94 % on RA. Emesis x2 one week ago. Denies blood in vomit. Dysphagia assessment one month ago at [facility]. Was put back on "regular diet." Pt does choke on thin liquids. Lung performance test 6-7 months ago and said her lungs were "sufficient." Also had recent echocardiogram done. No other complaints at this time.

## 2014-04-13 NOTE — Progress Notes (Signed)
  CARE MANAGEMENT ED NOTE 04/13/2014  Patient:  Jenny [name], Jenny [name]   Account Number:  [id number]  Date Initiated:  04/13/2014  Documentation initiated by:  Livia Snellen  Subjective/Objective Assessment:   Patient presents to Ed with dehydration, low sodium, pulse ox 88% on room air.     Subjective/Objective Assessment Detail:   Patient brought in by EMs from [facility].  HR elevated to 140's- 160's with shortness of breath.     Action/Plan:   Patient to be admitted   Action/Plan Detail:   Anticipated DC Date:       Status Recommendation to Physician:   Result of Recommendation:    Other ED Services  Consult [facility] [place]  Other    Choice offered to / List presented to:            Status of service:  Completed, signed off  ED Comments:   ED Comments Detail:  EDCM spoke to patient and her daughter John Giovanni at bedside. Nancy's home phone number [phone] and cell [phone].  Patient is a resident at [facility]. For the past 4-5 days patient has had a Charity fundraiser per patient's daughter.  Patient's daughter reports, "Patient has everything she needs there."  The staff at the facility assist patient with her ADL's.  All of patient's meals are provided by the facility.  Patient noted to be wearing oxygen in the ED.  Patient does not wear oxygen at home. Patient has a walker and safety rails at home.  Patient's other daughter Lesleigh Noe phone number [phone] and her son Allen's phone number [phone].  Patient and patient's daughter agree that patient does not require any additional hom ehalth assistance at this time.  No further EDCM needs at this time.

## 2014-04-13 NOTE — ED Notes (Signed)
Pt HR 140s-160s. Pt denies feeling heart flutter but does feel more SOB. 2L O2 [place] applied. MD Horton given EKG and is now at bedside. Verbal for 15 mg Cardizem. Family aware of plan. Will give cardizem immediately upon arrival from pharmacy.

## 2014-04-13 NOTE — ED Notes (Signed)
MD notified patient's HR still 110-120 and BP 818-299 systolic. Diltiazem gtt to be started before transfer. Amy RN on 2W aware. Also attempted placing pt on bedpan. Unsuccessful. Will bladder scan pt before transfer.

## 2014-04-13 NOTE — ED Notes (Signed)
Na 122 and Trop 1.76 called to MD Horton.

## 2014-04-13 NOTE — H&P (Addendum)
[facility] History and Physical  Jenny [name] ZOX:[id number] DOB: Jun 13, 1927 DOA: 04/13/2014  Referring physician: ED physician PCP: Estill Dooms, MD   Chief Complaint: shortness of breath   HPI:  Pt is 80 yo female with multiple and complex medical problems including HTN, HLD, dementia, CHF, CAD, hypothyroidism, who presented to [facility] ED with main concern of several days duration of progressively worsening fatigue, lethargy, shortness of breath. Please note that pt is unable to provide details and daughter at bedside was primary historian. Most of the available information obtained from available records as well. Pt apparently treated for PNA recently and is still on ABX but per daughter, pt has been moor sleepy and not eating much. No reported chest pain or abdomina, urinary concerns. Pt apparently has chronic dyspnea but slightly worse over the past several days.   In ED, pt noted to be in atrial fibrillation with HR in 150's, initial troponin elevated at 1.76. Na 122. Please note that pt has been denying chest pain since arrival to the ED. [facility] asked to admit for further evaluation.   Assessment and Plan: Active Problems:   Atrial fibrillation with RVR - admit to SDU - continue Cardizem drip - cardiology consulted (Dr. Dina Rich consulted cardiology Dr. Tamala Julian) and will follow up on recommendations  - no plan for cath    NSTEMI - elevated troponin likely from atrial fibrillation - continue to cycle CE's, check TSH, 12 lead EKG - provide oxygen , analgesia as needed - follow upon cardiology recommendations    Hyponatremia - possibly related to use of Lasix - obtain BMP in AM   Hypertensive urgency - currently on Lasix and cardizem drip - will add hydralazine as needed and readjust the regimen as indicated    LLL PNA - recently diagnosed and treated with Ceftin - continue here    Diastolic CHF, grade II - based on last 2  D ECHO in Jan 2015 - continue lasix as per home  medical regimen   Anemia of chronic disease - no signs of bleeding - CBC in AM   Hypothyroidism  - continue synthroid - check TSH   Radiological Exams on Admission: Dg Chest 2 View   04/13/2014   Partial improvement in the left lower lung airspace disease. Stable cardiomegaly    Code Status: Full Family Communication: Pt at bedside Disposition Plan: Admit for further evaluation     Review of Systems:  Unable to obtain secondary to dementia     Past Medical History  Diagnosis Date  . Hypertension   . Coronary artery disease   . Heart murmur   . Hypothyroidism 05-02-12    tx. Synthroid  . Headache(784.0) 05-02-12    past hx. migrianes-many yrs ago  . Arthritis 05-02-12    osteoarthritis-rt. hip  . Pneumonia 05-02-12    mild case  . 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 Acute upper respiratory infections of unspecified site 2014  . Unspecified venous (peripheral) insufficiency 2013  . Senile osteoporosis 2013  . Unspecified closed fracture of pelvis 10/13/2012  . Cellulitis and abscess of leg, except foot 2013  . Altered mental status 2013  . Chronic pain syndrome 2013  . Urinary tract infection, site not specified   . Hyposmolality and/or hyponatremia 05/2012  . Congestive heart failure, unspecified 05/2012  . Pneumonia, organism unspecified 05/2012  . Pressure ulcer, buttock(707.05) 05/2012  . Hypoxemia 05/2012  . Reflux esophagitis 3013  . Unspecified constipation 2013  . Acute posthemorrhagic anemia 05/2012  .  Coronary atherosclerosis of native coronary artery 05/2012  . Pain in joint, pelvic region and thigh 04/2012  . Dysphagia, unspecified(787.20) 03/2012  . Benign neoplasm of adrenal gland 10/2011  . Alzheimer's disease 06/2011  . Other seborrheic keratosis 2012  . Hypertensive renal disease, benign 2012  . Pain in joint, lower leg 2012  . Memory loss 09/2010  . Spinal stenosis, lumbar region, without neurogenic claudication 2011  . Unspecified vitamin D deficiency 2010  .  Unspecified disorder of kidney and ureter 2010  . Edema 2010  . Other malaise and fatigue 2009  . Congenital factor VIII disorder 2009  . Senile dementia with delusional features 2008  . Generalized hyperhidrosis 2007  . Irritable bowel syndrome 2007  . Unspecified chronic bronchitis 2006  . Depression 06/2005     depression only,tx. oral meds  . Shortness of breath 2005  . Anxiety 2004  . Unspecified late effects of cerebrovascular disease 2004  . Migraine with aura, without mention of intractable migraine without mention of status migrainosus 2003  . Tinnitus 2003  . Other symptoms involving cardiovascular system 2003  . Hyperlipidemia 2002    Past Surgical History  Procedure Laterality Date  . Tonsillectomy    . Tubal ligation  05-02-12  .  tummy tuck  05-02-12    many yrs ago  . Cataract extraction, bilateral  05-02-12    bilateral  . Retinal detachment surgery  05-02-12    left  . Total hip arthroplasty  05/09/2012    Procedure: TOTAL HIP ARTHROPLASTY ANTERIOR APPROACH;  Surgeon: Mcarthur Rossetti, MD;  Location: [facility] ORS;  Service: Orthopedics;  Laterality: Right;  . Dental surgery  08/11/13    3 teeth surgical removed by Dr. Lewanda Rife    Social History:  reports that she quit smoking about 5 years ago. Her smoking use included Cigarettes. She has a 62 pack-year smoking history. She has never used smokeless tobacco. She reports that she does not drink alcohol or use illicit drugs.  Allergies  Allergen Reactions  . Levaquin [Levofloxacin In D5w] Hives  . Penicillins Rash    Family History  Problem Relation Age of Onset  . Diabetes Father     Prior to Admission medications   Medication Sig Start Date End Date Taking? Authorizing Provider  acetaminophen (TYLENOL) 325 MG tablet Take 325 mg by mouth daily as needed for mild pain. One daily as needed   Yes Historical Provider, MD  albuterol (PROVENTIL HFA;VENTOLIN HFA) 108 (90 BASE) MCG/ACT inhaler Inhale 2 puffs into the  lungs every 6 (six) hours as needed for wheezing or shortness of breath.   Yes Historical Provider, MD  Ascorbic Acid (VITAMIN C) 1000 MG tablet Take 1,000 mg by mouth daily with breakfast.   Yes Historical Provider, MD  aspirin 325 MG tablet Take 1 tablet (325 mg total) by mouth daily. 11/20/13  Yes Myrtis Ser, MD  brimonidine-timolol (COMBIGAN) 0.2-0.5 % ophthalmic solution Place 1 drop into both eyes every 12 (twelve) hours.    Yes Historical Provider, MD  calcitonin, salmon, (MIACALCIN/FORTICAL) 200 UNIT/ACT nasal spray Place 1 spray into alternate nostrils daily. Use one spray in one nostril once daily. Alternate nostrils daily 07/23/13  Yes Tiffany L Reed, DO  calcium-vitamin D (OSCAL WITH D) 500-200 MG-UNIT per tablet Take 1 tablet by mouth 3 (three) times daily. Take one tablet three times daily for calcium supplementation 03/12/13  Yes Estill Dooms, MD  cefUROXime (CEFTIN) 500 MG tablet Take 500 mg by mouth  2 (two) times daily with a meal. For 10 days. 04/04/14  Yes Historical Provider, MD  clonazePAM (KLONOPIN) 0.25 MG disintegrating tablet Take 0.25 mg by mouth every 4 (four) hours as needed (breakthrough anxiety).   Yes Historical Provider, MD  clonazePAM (KLONOPIN) 0.5 MG tablet Take 0.5 mg by mouth at bedtime.   Yes Historical Provider, MD  ferrous sulfate 325 (65 FE) MG tablet Take 325 mg by mouth 2 (two) times daily with a meal.   Yes Historical Provider, MD  furosemide (LASIX) 20 MG tablet Take 20 mg by mouth 2 (two) times daily.   Yes Historical Provider, MD  ibuprofen (ADVIL,MOTRIN) 200 MG tablet Take 400 mg by mouth 3 (three) times daily with meals.   Yes Historical Provider, MD  ipratropium-albuterol (DUONEB) 0.5-2.5 (3) MG/3ML SOLN Take 3 mLs by nebulization every 6 (six) hours as needed (cough/wheeze.). AND every 6 hours for 5 days starting 04/09/14.  (Last dose 04/14/14 at 0000.)   Yes Historical Provider, MD  lamoTRIgine (LAMICTAL) 200 MG tablet Take 200 mg by mouth daily.   Yes  Historical Provider, MD  levothyroxine (SYNTHROID, LEVOTHROID) 88 MCG tablet Take 88 mcg by mouth daily before breakfast.   Yes Historical Provider, MD  LORazepam (ATIVAN) 0.5 MG tablet Take 0.5 mg by mouth every 4 (four) hours as needed for anxiety (IN THE EVENT THAT CLONAZEPAM IS UNAVAILABLE.).   Yes Historical Provider, MD  multivitamin-lutein (OCUVITE-LUTEIN) CAPS Take 1 capsule by mouth daily.   Yes Historical Provider, MD  naproxen sodium (ANAPROX) 220 MG tablet Take 220 mg by mouth daily as needed. Take one daily   Yes Historical Provider, MD  nitroGLYCERIN (NITROSTAT) 0.4 MG SL tablet Place 0.4 mg under the tongue every 5 (five) minutes as needed for chest pain.   Yes Historical Provider, MD  omeprazole (PRILOSEC) 20 MG capsule Take 20 mg by mouth 2 (two) times daily before a meal.   Yes Historical Provider, MD  omeprazole (PRILOSEC) 40 MG capsule Take 40 mg by mouth 4 (four) times daily.   Yes Historical Provider, MD  pilocarpine (SALAGEN) 5 MG tablet Take 5 mg by mouth 3 (three) times daily.   Yes Historical Provider, MD  potassium chloride SA (K-DUR,KLOR-CON) 20 MEQ tablet Take 20 mEq by mouth 2 (two) times daily.   Yes Historical Provider, MD  saccharomyces boulardii (FLORASTOR) 250 MG capsule Take 250 mg by mouth 2 (two) times daily. For 10 days. 04/04/14  Yes Historical Provider, MD  senna (SENOKOT) 8.6 MG TABS Take 2 tablets by mouth at bedtime.   Yes Historical Provider, MD  sertraline (ZOLOFT) 25 MG tablet Take 1 tablet (25 mg total) by mouth daily. 11/20/13  Yes Myrtis Ser, MD    Physical Exam: Filed Vitals:   04/13/14 1700 04/13/14 1730 04/13/14 1745 04/13/14 1800  BP: 195/64 211/66 199/91 215/73  Pulse: 76 70 137 57  Temp:      TempSrc:      Resp: 15 28  26   SpO2: 94% 95% 96% 100%    Physical Exam  Constitutional: Appears well-developed and well-nourished. No distress.  HENT: Normocephalic. External right and left ear normal. Dry MM Eyes: Conjunctivae and EOM are  normal. PERRLA, no scleral icterus.  Neck: Normal ROM. Neck supple. No JVD. No tracheal deviation. No thyromegaly.  CVS: IRRR, SEM 3/6, no gallops, no carotid bruit.  Pulmonary: Effort and breath sounds normal, no stridor, rhonchi, diminished breath sounds at bases  Abdominal: Soft. BS +,  no distension, tenderness,  rebound or guarding.  Musculoskeletal: Normal range of motion. No edema and no tenderness.  Lymphadenopathy: No lymphadenopathy noted, cervical, inguinal. Neuro: Alert. Normal reflexes, muscle tone coordination. No cranial nerve deficit. Skin: Skin is warm and dry. No rash noted. Not diaphoretic. No erythema. No pallor.  Psychiatric: Normal mood and affect.   Labs on Admission:  Basic Metabolic Panel:  Recent Labs Lab 04/13/14 1519  NA 122*  K 4.3  CL 84*  CO2 21  GLUCOSE 107*  BUN 13  CREATININE 1.03  CALCIUM 8.9   Liver Function Tests:  Recent Labs Lab 04/13/14 1519  AST 21  ALT 15  ALKPHOS 92  BILITOT <0.2*  PROT 6.4  ALBUMIN 3.1*   CBC:  Recent Labs Lab 04/07/14 04/13/14 1519  WBC 11.6 7.7  NEUTROABS  --  4.8  HGB 9.1* 9.2*  HCT 28* 26.5*  MCV  --  81.8  PLT 476* 589*   Cardiac Enzymes:  Recent Labs Lab 04/13/14 1519  TROPONINI 1.76*   EKG: Normal sinus rhythm, no ST/T wave changes  Theodis Blaze, MD  [facility] Pager [phone]  If 7PM-7AM, please contact night-coverage [email or web address] Password [email or web address] 04/13/2014, 6:16 PM

## 2014-04-13 NOTE — ED Notes (Signed)
Trop 1.76 Called by Amil Amen

## 2014-04-13 NOTE — ED Notes (Signed)
Per EMS- called out b/c patient had Na 117 and thought patient was dehydrated. Labs drawn at [phone]. Wants to come here for fluids and diuretics. BP 220/100 HR 100 SpO2 88% on RA. Put on 2L O2. No other complaints.

## 2014-04-14 ENCOUNTER — Encounter (HOSPITAL_COMMUNITY): Payer: Self-pay | Admitting: Cardiology

## 2014-04-14 ENCOUNTER — Inpatient Hospital Stay (HOSPITAL_COMMUNITY): Payer: Medicare Other

## 2014-04-14 DIAGNOSIS — J449 Chronic obstructive pulmonary disease, unspecified: Secondary | ICD-10-CM

## 2014-04-14 DIAGNOSIS — I214 Non-ST elevation (NSTEMI) myocardial infarction: Secondary | ICD-10-CM

## 2014-04-14 DIAGNOSIS — N183 Chronic kidney disease, stage 3 unspecified: Secondary | ICD-10-CM

## 2014-04-14 DIAGNOSIS — R5381 Other malaise: Secondary | ICD-10-CM

## 2014-04-14 DIAGNOSIS — I509 Heart failure, unspecified: Secondary | ICD-10-CM

## 2014-04-14 DIAGNOSIS — I2510 Atherosclerotic heart disease of native coronary artery without angina pectoris: Secondary | ICD-10-CM

## 2014-04-14 DIAGNOSIS — F411 Generalized anxiety disorder: Secondary | ICD-10-CM

## 2014-04-14 DIAGNOSIS — I4891 Unspecified atrial fibrillation: Secondary | ICD-10-CM

## 2014-04-14 LAB — STREP PNEUMONIAE URINARY ANTIGEN: Strep Pneumo Urinary Antigen: NEGATIVE

## 2014-04-14 LAB — LIPID PANEL
Cholesterol: 141 mg/dL (ref 0–200)
HDL: 37 mg/dL — ABNORMAL LOW (ref >39)
LDL CALC: 79 mg/dL (ref 0–99)
TRIGLYCERIDES: 126 mg/dL (ref <150)
Total CHOL/HDL Ratio: 3.8 RATIO
VLDL: 25 mg/dL (ref 0–40)

## 2014-04-14 LAB — OSMOLALITY: Osmolality: 260 mOsm/kg — ABNORMAL LOW (ref 275–300)

## 2014-04-14 LAB — BASIC METABOLIC PANEL
BUN: 11 mg/dL (ref 6–23)
CALCIUM: 8.3 mg/dL — AB (ref 8.4–10.5)
CO2: 25 meq/L (ref 19–32)
CREATININE: 1.09 mg/dL (ref 0.50–1.10)
Chloride: 86 mEq/L — ABNORMAL LOW (ref 96–112)
GFR calc Af Amer: 52 mL/min — ABNORMAL LOW (ref >90)
GFR, EST NON AFRICAN AMERICAN: 45 mL/min — AB (ref >90)
Glucose, Bld: 143 mg/dL — ABNORMAL HIGH (ref 70–99)
Potassium: 3.9 mEq/L (ref 3.7–5.3)
SODIUM: 122 meq/L — AB (ref 137–147)

## 2014-04-14 LAB — CBC
HCT: 24.5 % — ABNORMAL LOW (ref 36.0–46.0)
Hemoglobin: 8.3 g/dL — ABNORMAL LOW (ref 12.0–15.0)
MCH: 27.9 pg (ref 26.0–34.0)
MCHC: 33.9 g/dL (ref 30.0–36.0)
MCV: 82.5 fL (ref 78.0–100.0)
PLATELETS: 537 10*3/uL — AB (ref 150–400)
RBC: 2.97 MIL/uL — AB (ref 3.87–5.11)
RDW: 13.8 % (ref 11.5–15.5)
WBC: 8.9 10*3/uL (ref 4.0–10.5)

## 2014-04-14 LAB — D-DIMER, QUANTITATIVE: D-Dimer, Quant: 1.25 ug/mL-FEU — ABNORMAL HIGH (ref 0.00–0.48)

## 2014-04-14 LAB — TROPONIN I
TROPONIN I: 1.69 ng/mL — AB (ref <0.30)
Troponin I: 1.95 ng/mL (ref <0.30)

## 2014-04-14 LAB — OSMOLALITY, URINE: Osmolality, Ur: 129 mOsm/kg — ABNORMAL LOW (ref 390–1090)

## 2014-04-14 LAB — CREATININE, URINE, RANDOM: CREATININE, URINE: 18.5 mg/dL

## 2014-04-14 LAB — SODIUM, URINE, RANDOM: Sodium, Ur: 22 mEq/L

## 2014-04-14 MED ORDER — LEVALBUTEROL HCL 0.63 MG/3ML IN NEBU
0.6300 mg | INHALATION_SOLUTION | Freq: Four times a day (QID) | RESPIRATORY_TRACT | Status: DC
  Administered 2014-04-14 – 2014-04-16 (×9): 0.63 mg via RESPIRATORY_TRACT
  Filled 2014-04-14 (×15): qty 3

## 2014-04-14 MED ORDER — IOHEXOL 350 MG/ML SOLN
100.0000 mL | Freq: Once | INTRAVENOUS | Status: AC | PRN
  Administered 2014-04-14: 100 mL via INTRAVENOUS

## 2014-04-14 MED ORDER — CARVEDILOL 6.25 MG PO TABS
6.2500 mg | ORAL_TABLET | Freq: Two times a day (BID) | ORAL | Status: DC
  Administered 2014-04-14 – 2014-04-20 (×11): 6.25 mg via ORAL
  Filled 2014-04-14 (×16): qty 1

## 2014-04-14 NOTE — Progress Notes (Signed)
Frequent productive cough.  Secretions are thick yellow/tan.

## 2014-04-14 NOTE — Consult Note (Signed)
Reason for Consult: PAF, NSTEMI   Referring Physician: Dr. Tana Coast PCP:  Estill Dooms, MD Primary Cardiologist:Dr. Domenic Polite in 2009 none since  Jenny [name] is an 80 y.o. female.    Chief Complaint:  Shortness of breath, admitted 04/13/14.  HPI:  80 yo female with multiple and complex medical problems including HTN, HLD, dementia, CHF, CAD- I see no record for CAD., hypothyroidism, who presented to [facility] ED with main concern of several days duration of progressively worsening fatigue, lethargy, shortness of breath. Please note that pt is unable to provide details and daughter at bedside was primary historian.  Pt apparently treated for PNA in Jan 2015 and again last week and is still on ABX but per daughter, pt has been moor sleepy and not eating much. No reported chest pain or abdominal pain, no urinary concerns. Pt apparently has chronic dyspnea but slightly worse over the past several days.   In ED, pt noted to be in atrial fibrillation with HR in 150's, initial troponin elevated at 1.76. Na 122. Please note that pt has been denying chest pain since arrival to the ED. Cardiology has been asked to see.   From the chart in 2009 history of moderate bilateral internal carotid disease and also left subclavian disease, hypertension, and previous TIA, I referred her for follow-up testing.  She had evidence of possible aortic valve sclerosis on examination and this was confirmed by echocardiography, demonstrating a moderately calcified aortic valve with mildly reduced leaflet excursion, but no frank stenosis. Her ejection fraction was 65-70%, and she had mild  annular calcification with mild mitral regurgitation. She had decreased lower extremity distal pulses without frank claudication, and to further investigate this, we did lower extremity Doppler studies which revealed normal ABIs with no clear evidence of resting obstructive peripheral arterial disease. I see that she had recent lipids  obtained showing a total cholesterol of 189, triglycerides 78, HDL 66 and LDL 107.   In Jan 2015 she was admitted with PNA and had PAF RVR and converted to SR with IV dilt drip. HR was in the 40-50s.   2 D echo done 11/2013:  No increase of cardiac enzymes at that time.  - Left ventricle: The cavity size was normal. There was mild concentric hypertrophy. Systolic function was normal. The estimated ejection fraction was in the range of 60% to 65%. Wall motion was normal; there were no regional wall motion abnormalities. Doppler parameters are consistent withpseudonormal left ventricular relaxation (grade2 diastolic dysfunction). The E/e' ratio is >15, suggesting markedly abnromal LV filling pressure. The E/A ratio is >1.5. - Aortic valve: Valve area: 1.35cm^2(VTI). Valve area: 1.15cm^2 (Vmax). - Mitral valve: Calcified annulus. Mild mitral stenosis - peak and mean gradients of 12 mmHg and 5 mmHg, respectively. Mild regurgitation. Valve area by continuity equation (using LVOT flow): 1.21cm^2. - Left atrium: The atrium was mildly dilated (area 23 cm2). - Right ventricle: The cavity size was mildly dilated. Systolic function was normal. RV systolic pressure: 67RJ Hg (S, est). - Right atrium: Severely dilated (24 cm2). - Tricuspid valve: Moderate regurgitation. - Inferior vena cava: The vessel was normal in size; the respirophasic diameter changes were in the normal range (= 50%); findings are consistent with normal central venous pressure. - Pericardium, extracardiac: There was no pericardial effusion.   Pt was admitted and EKG SR but by 1744 she went into a fib with RVR. She was started on IV dilt and is now  in SR. Troponin peaked at 2.18 and is now decreasing.  Past Medical History  Diagnosis Date  . Hypertension   . Coronary artery disease   . Heart murmur   . Hypothyroidism 05-02-12    tx. Synthroid  . Headache(784.0) 05-02-12    past hx. migrianes-many yrs ago  . Arthritis  05-02-12    osteoarthritis-rt. hip  . Pneumonia 05-02-12    mild case  . 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 Acute upper respiratory infections of unspecified site 2014  . Unspecified venous (peripheral) insufficiency 2013  . Senile osteoporosis 2013  . Unspecified closed fracture of pelvis 10/13/2012  . Cellulitis and abscess of leg, except foot 2013  . Altered mental status 2013  . Chronic pain syndrome 2013  . Urinary tract infection, site not specified   . Hyposmolality and/or hyponatremia 05/2012  . Congestive heart failure, unspecified 05/2012  . Pneumonia, organism unspecified 05/2012  . Pressure ulcer, buttock(707.05) 05/2012  . Hypoxemia 05/2012  . Reflux esophagitis 3013  . Unspecified constipation 2013  . Acute posthemorrhagic anemia 05/2012  . Coronary atherosclerosis of native coronary artery 05/2012  . Pain in joint, pelvic region and thigh 04/2012  . Dysphagia, unspecified(787.20) 03/2012  . Benign neoplasm of adrenal gland 10/2011  . Alzheimer's disease 06/2011  . Other seborrheic keratosis 2012  . Hypertensive renal disease, benign 2012  . Pain in joint, lower leg 2012  . Memory loss 09/2010  . Spinal stenosis, lumbar region, without neurogenic claudication 2011  . Unspecified vitamin D deficiency 2010  . Unspecified disorder of kidney and ureter 2010  . Edema 2010  . Other malaise and fatigue 2009  . Congenital factor VIII disorder 2009  . Senile dementia with delusional features 2008  . Generalized hyperhidrosis 2007  . Irritable bowel syndrome 2007  . Unspecified chronic bronchitis 2006  . Depression 06/2005     depression only,tx. oral meds  . Shortness of breath 2005  . Anxiety 2004  . Unspecified late effects of cerebrovascular disease 2004  . Migraine with aura, without mention of intractable migraine without mention of status migrainosus 2003  . Tinnitus 2003  . Other symptoms involving cardiovascular system 2003  . Hyperlipidemia 2002    Past Surgical  History  Procedure Laterality Date  . Tonsillectomy    . Tubal ligation  05-02-12  .  tummy tuck  05-02-12    many yrs ago  . Cataract extraction, bilateral  05-02-12    bilateral  . Retinal detachment surgery  05-02-12    left  . Total hip arthroplasty  05/09/2012    Procedure: TOTAL HIP ARTHROPLASTY ANTERIOR APPROACH;  Surgeon: Mcarthur Rossetti, MD;  Location: [facility];  Service: Orthopedics;  Laterality: Right;  . Dental surgery  08/11/13    3 teeth surgical removed by Dr. Lewanda Rife    Family History  Problem Relation Age of Onset  . Diabetes Father    Social History:  reports that she quit smoking about 5 years ago. Her smoking use included Cigarettes. She has a 62 pack-year smoking history. She has never used smokeless tobacco. She reports that she does not drink alcohol or use illicit drugs.  Allergies:  Allergies  Allergen Reactions  . Levaquin [Levofloxacin In D5w] Hives  . Penicillins Rash    Medications Prior to Admission  Medication Sig Dispense Refill  . acetaminophen (TYLENOL) 325 MG tablet Take 325 mg by mouth daily as needed for mild pain. One daily as needed      .  albuterol (PROVENTIL HFA;VENTOLIN HFA) 108 (90 BASE) MCG/ACT inhaler Inhale 2 puffs into the lungs every 6 (six) hours as needed for wheezing or shortness of breath.      . Ascorbic Acid (VITAMIN C) 1000 MG tablet Take 1,000 mg by mouth daily with breakfast.      . aspirin 325 MG tablet Take 1 tablet (325 mg total) by mouth daily.  30 tablet  0  . brimonidine-timolol (COMBIGAN) 0.2-0.5 % ophthalmic solution Place 1 drop into both eyes every 12 (twelve) hours.       . calcitonin, salmon, (MIACALCIN/FORTICAL) 200 UNIT/ACT nasal spray Place 1 spray into alternate nostrils daily. Use one spray in one nostril once daily. Alternate nostrils daily      . calcium-vitamin D (OSCAL WITH D) 500-200 MG-UNIT per tablet Take 1 tablet by mouth 3 (three) times daily. Take one tablet three times daily for calcium  supplementation      . cefUROXime (CEFTIN) 500 MG tablet Take 500 mg by mouth 2 (two) times daily with a meal. For 10 days.      . clonazePAM (KLONOPIN) 0.25 MG disintegrating tablet Take 0.25 mg by mouth every 4 (four) hours as needed (breakthrough anxiety).      . clonazePAM (KLONOPIN) 0.5 MG tablet Take 0.5 mg by mouth at bedtime.      . ferrous sulfate 325 (65 FE) MG tablet Take 325 mg by mouth 2 (two) times daily with a meal.      . furosemide (LASIX) 20 MG tablet Take 20 mg by mouth 2 (two) times daily.      Marland Kitchen ibuprofen (ADVIL,MOTRIN) 200 MG tablet Take 400 mg by mouth 3 (three) times daily with meals.      Marland Kitchen ipratropium-albuterol (DUONEB) 0.5-2.5 (3) MG/3ML SOLN Take 3 mLs by nebulization every 6 (six) hours as needed (cough/wheeze.). AND every 6 hours for 5 days starting 04/09/14.  (Last dose 04/14/14 at 0000.)      . lamoTRIgine (LAMICTAL) 200 MG tablet Take 200 mg by mouth daily.      Marland Kitchen levothyroxine (SYNTHROID, LEVOTHROID) 88 MCG tablet Take 88 mcg by mouth daily before breakfast.      . LORazepam (ATIVAN) 0.5 MG tablet Take 0.5 mg by mouth every 4 (four) hours as needed for anxiety (IN THE EVENT THAT CLONAZEPAM IS UNAVAILABLE.).      Marland Kitchen multivitamin-lutein (OCUVITE-LUTEIN) CAPS Take 1 capsule by mouth daily.      . naproxen sodium (ANAPROX) 220 MG tablet Take 220 mg by mouth daily as needed. Take one daily      . nitroGLYCERIN (NITROSTAT) 0.4 MG SL tablet Place 0.4 mg under the tongue every 5 (five) minutes as needed for chest pain.      Marland Kitchen omeprazole (PRILOSEC) 20 MG capsule Take 20 mg by mouth 2 (two) times daily before a meal.      . omeprazole (PRILOSEC) 40 MG capsule Take 40 mg by mouth 4 (four) times daily.      . pilocarpine (SALAGEN) 5 MG tablet Take 5 mg by mouth 3 (three) times daily.      . potassium chloride SA (K-DUR,KLOR-CON) 20 MEQ tablet Take 20 mEq by mouth 2 (two) times daily.      Marland Kitchen saccharomyces boulardii (FLORASTOR) 250 MG capsule Take 250 mg by mouth 2 (two) times  daily. For 10 days.      Marland Kitchen senna (SENOKOT) 8.6 MG TABS Take 2 tablets by mouth at bedtime.      . sertraline (ZOLOFT)  25 MG tablet Take 1 tablet (25 mg total) by mouth daily.  30 tablet  0    Results for orders placed during the hospital encounter of 04/13/14 (from the past 48 hour(s))  CBC WITH DIFFERENTIAL     Status: Abnormal   Collection Time    04/13/14  3:19 PM      Result Value Ref Range   WBC 7.7  4.0 - 10.5 K/uL   RBC 3.24 (*) 3.87 - 5.11 MIL/uL   Hemoglobin 9.2 (*) 12.0 - 15.0 g/dL   HCT 26.5 (*) 36.0 - 46.0 %   MCV 81.8  78.0 - 100.0 fL   MCH 28.4  26.0 - 34.0 pg   MCHC 34.7  30.0 - 36.0 g/dL   RDW 13.6  11.5 - 15.5 %   Platelets 589 (*) 150 - 400 K/uL   Neutrophils Relative % 61  43 - 77 %   Neutro Abs 4.8  1.7 - 7.7 K/uL   Lymphocytes Relative 18  12 - 46 %   Lymphs Abs 1.4  0.7 - 4.0 K/uL   Monocytes Relative 15 (*) 3 - 12 %   Monocytes Absolute 1.2 (*) 0.1 - 1.0 K/uL   Eosinophils Relative 5  0 - 5 %   Eosinophils Absolute 0.4  0.0 - 0.7 K/uL   Basophils Relative 1  0 - 1 %   Basophils Absolute 0.1  0.0 - 0.1 K/uL  COMPREHENSIVE METABOLIC PANEL     Status: Abnormal   Collection Time    04/13/14  3:19 PM      Result Value Ref Range   Sodium 122 (*) 137 - 147 mEq/L   Potassium 4.3  3.7 - 5.3 mEq/L   Chloride 84 (*) 96 - 112 mEq/L   CO2 21  19 - 32 mEq/L   Glucose, Bld 107 (*) 70 - 99 mg/dL   BUN 13  6 - 23 mg/dL   Creatinine, Ser 1.03  0.50 - 1.10 mg/dL   Calcium 8.9  8.4 - 10.5 mg/dL   Total Protein 6.4  6.0 - 8.3 g/dL   Albumin 3.1 (*) 3.5 - 5.2 g/dL   AST 21  0 - 37 U/L   ALT 15  0 - 35 U/L   Alkaline Phosphatase 92  39 - 117 U/L   Total Bilirubin <0.2 (*) 0.3 - 1.2 mg/dL   GFR calc non Af Amer 49 (*) >90 mL/min   GFR calc Af Amer 56 (*) >90 mL/min   Comment: (NOTE)     The eGFR has been calculated using the CKD EPI equation.     This calculation has not been validated in all clinical situations.     eGFR's persistently <90 mL/min signify possible  Chronic Kidney     Disease.  PRO B NATRIURETIC PEPTIDE     Status: Abnormal   Collection Time    04/13/14  3:19 PM      Result Value Ref Range   Pro B Natriuretic peptide (BNP) 3909.0 (*) 0 - 450 pg/mL  TROPONIN I     Status: Abnormal   Collection Time    04/13/14  3:19 PM      Result Value Ref Range   Troponin I 1.76 (*) <0.30 ng/mL   Comment:            Due to the release kinetics of cTnI,     a negative result within the first hours     of the onset of  symptoms does not rule out     myocardial infarction with certainty.     If myocardial infarction is still suspected,     repeat the test at appropriate intervals.     CRITICAL RESULT CALLED TO, READ BACK BY AND VERIFIED WITH:     BLAKELY,E AT 2035 ON [id number] BY POTEAT,S  URINALYSIS, ROUTINE W REFLEX MICROSCOPIC     Status: Abnormal   Collection Time    04/13/14  4:10 PM      Result Value Ref Range   Color, Urine YELLOW  YELLOW   APPearance CLEAR  CLEAR   Specific Gravity, Urine 1.004 (*) 1.005 - 1.030   pH 5.5  5.0 - 8.0   Glucose, UA NEGATIVE  NEGATIVE mg/dL   Hgb urine dipstick NEGATIVE  NEGATIVE   Bilirubin Urine NEGATIVE  NEGATIVE   Ketones, ur NEGATIVE  NEGATIVE mg/dL   Protein, ur NEGATIVE  NEGATIVE mg/dL   Urobilinogen, UA 0.2  0.0 - 1.0 mg/dL   Nitrite NEGATIVE  NEGATIVE   Leukocytes, UA NEGATIVE  NEGATIVE   Comment: MICROSCOPIC NOT DONE ON URINES WITH NEGATIVE PROTEIN, BLOOD, LEUKOCYTES, NITRITE, OR GLUCOSE <1000 mg/dL.  CREATININE, URINE, RANDOM     Status: None   Collection Time    04/13/14  4:10 PM      Result Value Ref Range   Creatinine, Urine 18.5     Comment: Performed at [facility]  TSH     Status: None   Collection Time    04/13/14  6:41 PM      Result Value Ref Range   TSH 1.690  0.350 - 4.500 uIU/mL   Comment: Performed at [facility]  TROPONIN I     Status: Abnormal   Collection Time    04/13/14  6:41 PM      Result Value Ref Range   Troponin I 2.18 (*) <0.30 ng/mL    Comment:            Due to the release kinetics of cTnI,     a negative result within the first hours     of the onset of symptoms does not rule out     myocardial infarction with certainty.     If myocardial infarction is still suspected,     repeat the test at appropriate intervals.     CRITICAL VALUE NOTED.  VALUE IS CONSISTENT WITH PREVIOUSLY REPORTED AND CALLED VALUE.  MRSA PCR SCREENING     Status: None   Collection Time    04/13/14  7:57 PM      Result Value Ref Range   MRSA by PCR NEGATIVE  NEGATIVE   Comment:            The GeneXpert MRSA Assay (FDA     approved for NASAL specimens     only), is one component of a     comprehensive MRSA colonization     surveillance program. It is not     intended to diagnose MRSA     infection nor to guide or     monitor treatment for     MRSA infections.  BASIC METABOLIC PANEL     Status: Abnormal   Collection Time    04/14/14  1:42 AM      Result Value Ref Range   Sodium 122 (*) 137 - 147 mEq/L   Potassium 3.9  3.7 - 5.3 mEq/L   Chloride 86 (*) 96 - 112 mEq/L   CO2  25  19 - 32 mEq/L   Glucose, Bld 143 (*) 70 - 99 mg/dL   BUN 11  6 - 23 mg/dL   Creatinine, Ser 1.09  0.50 - 1.10 mg/dL   Calcium 8.3 (*) 8.4 - 10.5 mg/dL   GFR calc non Af Amer 45 (*) >90 mL/min   GFR calc Af Amer 52 (*) >90 mL/min   Comment: (NOTE)     The eGFR has been calculated using the CKD EPI equation.     This calculation has not been validated in all clinical situations.     eGFR's persistently <90 mL/min signify possible Chronic Kidney     Disease.  CBC     Status: Abnormal   Collection Time    04/14/14  1:42 AM      Result Value Ref Range   WBC 8.9  4.0 - 10.5 K/uL   RBC 2.97 (*) 3.87 - 5.11 MIL/uL   Hemoglobin 8.3 (*) 12.0 - 15.0 g/dL   HCT 24.5 (*) 36.0 - 46.0 %   MCV 82.5  78.0 - 100.0 fL   MCH 27.9  26.0 - 34.0 pg   MCHC 33.9  30.0 - 36.0 g/dL   RDW 13.8  11.5 - 15.5 %   Platelets 537 (*) 150 - 400 K/uL  TROPONIN I     Status: Abnormal    Collection Time    04/14/14  1:42 AM      Result Value Ref Range   Troponin I 1.95 (*) <0.30 ng/mL   Comment:            Due to the release kinetics of cTnI,     a negative result within the first hours     of the onset of symptoms does not rule out     myocardial infarction with certainty.     If myocardial infarction is still suspected,     repeat the test at appropriate intervals.     CRITICAL VALUE NOTED.  VALUE IS CONSISTENT WITH PREVIOUSLY REPORTED AND CALLED VALUE.  STREP PNEUMONIAE URINARY ANTIGEN     Status: None   Collection Time    04/14/14  6:33 AM      Result Value Ref Range   Strep Pneumo Urinary Antigen NEGATIVE  NEGATIVE   Comment:            Infection due to S. pneumoniae     cannot be absolutely ruled out     since the antigen present     may be below the detection limit     of the test.     Performed at [facility]     Status: Abnormal   Collection Time    04/14/14  6:41 AM      Result Value Ref Range   Osmolality 260 (*) 275 - 300 mOsm/kg   Comment: Performed at [facility]  TROPONIN I     Status: Abnormal   Collection Time    04/14/14  6:50 AM      Result Value Ref Range   Troponin I 1.69 (*) <0.30 ng/mL   Comment:            Due to the release kinetics of cTnI,     a negative result within the first hours     of the onset of symptoms does not rule out     myocardial infarction with certainty.     If myocardial infarction is still suspected,  repeat the test at appropriate intervals.     CRITICAL VALUE NOTED.  VALUE IS CONSISTENT WITH PREVIOUSLY REPORTED AND CALLED VALUE.  D-DIMER, QUANTITATIVE     Status: Abnormal   Collection Time    04/14/14 10:54 AM      Result Value Ref Range   D-Dimer, Quant 1.25 (*) 0.00 - 0.48 ug/mL-FEU   Comment:            AT THE INHOUSE ESTABLISHED CUTOFF     VALUE OF 0.48 ug/mL FEU,     THIS ASSAY HAS BEEN DOCUMENTED     IN THE LITERATURE TO HAVE     A SENSITIVITY AND NEGATIVE      PREDICTIVE VALUE OF AT LEAST     98 TO 99%.  THE TEST RESULT     SHOULD BE CORRELATED WITH     AN ASSESSMENT OF THE CLINICAL     PROBABILITY OF DVT / VTE.  LIPID PANEL     Status: Abnormal   Collection Time    04/14/14 10:54 AM      Result Value Ref Range   Cholesterol 141  0 - 200 mg/dL   Triglycerides 126  <150 mg/dL   HDL 37 (*) >39 mg/dL   Total CHOL/HDL Ratio 3.8     VLDL 25  0 - 40 mg/dL   LDL Cholesterol 79  0 - 99 mg/dL   Comment:            Total Cholesterol/HDL:CHD Risk     Coronary Heart Disease Risk Table                         Men   Women      1/2 Average Risk   3.4   3.3      Average Risk       5.0   4.4      2 X Average Risk   9.6   7.1      3 X Average Risk  23.4   11.0                Use the calculated Patient Ratio     above and the CHD Risk Table     to determine the patient's CHD Risk.                ATP III CLASSIFICATION (LDL):      <100     mg/dL   Optimal      100-129  mg/dL   Near or Above                        Optimal      130-159  mg/dL   Borderline      160-189  mg/dL   High      >190     mg/dL   Very High     Performed at [facility]  SODIUM, URINE, RANDOM     Status: None   Collection Time    04/14/14 11:11 AM      Result Value Ref Range   Sodium, Ur 22     Comment: Performed at [place], URINE     Status: Abnormal   Collection Time    04/14/14 11:11 AM      Result Value Ref Range   Osmolality, Ur 129 (*) 390 - 1090 mOsm/kg   Comment: Performed at [facility]  Dg Chest 2 View  04/13/2014   CLINICAL DATA:  DEHYDRATION  EXAM: CHEST - 2 VIEW  COMPARISON:  11/15/2013  FINDINGS: Stable cardiomegaly. Improving left lower lung airspace disease, with mild scattered residual airspace opacities. There is some residual linear scarring or subsegmental atelectasis in the left mid lung. Right lung remains clear. Atheromatous aorta. . No effusion. Degenerative changes in bilateral shoulders.  IMPRESSION: 1.  Partial improvement in the left lower lung airspace disease. 2. Stable cardiomegaly   Electronically Signed   By: Arne Cleveland M.D.   On: 04/13/2014 15:56   Dg Chest Port 1 View  04/14/2014   CLINICAL DATA:  Shortness of breath  EXAM: PORTABLE CHEST - 1 VIEW  COMPARISON:  04/13/2014  FINDINGS: Mild cardiomegaly which is stable from yesterday. Mediastinal contours are distorted by leftward rotation. There are streaky lung opacities at the bases, left more than right. No change from prior to suggest pneumonia, edema, effusion, or pneumothorax.  IMPRESSION: Unchanged bibasilar lung opacities, morphology favoring atelectasis.   Electronically Signed   By: Jorje Guild M.D.   On: 04/14/2014 07:08    ROS: General:no colds or fevers, no weight changes Skin:no rashes or ulcers HEENT:no blurred vision, no congestion CV:see HPI PUL:see HPI, + congested cough  GI:no diarrhea constipation or melena, no indigestion GU:no hematuria, no dysuria MS:no joint pain, no claudication Neuro:no syncope, no lightheadedness Endo:no diabetes, no thyroid disease   Blood pressure 104/34, pulse 75, temperature 98 F (36.7 C), temperature source Oral, resp. rate 27, height 5' 4"  (1.626 m), weight 153 lb 14.1 oz (69.8 kg), SpO2 96.00%. PE: General:Pleasant affect, NAD- though complains about sweating. Skin:Warm and dry to moist, brisk capillary refill HEENT:normocephalic, sclera clear, mucus membranes moist Neck:supple, no JVD sitting up, no bruits  Heart:S1S2 RRR with soft systolic murmur, no gallup, rub or click Lungs: with rales in the bases, rhonchi, or wheezes TKP:TWSF, non tender, + BS, do not palpate liver spleen or masses Ext:no lower ext edema, 2+ pedal pulses, 2+ radial pulses Neuro:alert, poor historian, though does know she has been married 43 years and lives with husband,  and oriented to people and place, MAE, follows commands, + facial symmetry    Assessment/Plan Principal Problem:   Atrial  fibrillation with RVR, though was in SR on admit.  Now back in SR after dilt.  On ASA - on coreg now but use with caution as pt had bradycardia in the 40s once she went back to SR in 11/2013- tachy/brady syndrome Active Problems:   Bronchitis, chronic   Hypothyroidism-TSH 1.69   CAD (coronary artery disease)- presumed no documentation of CAD  She does have PVD   Bipolar I disorder, most recent episode mixed   GERD (gastroesophageal reflux disease)   Other dysphagia   Congestive heart failure, unspecified -most likely diastolic BNP (last 3 results)  Recent Labs  11/15/13 1325 11/17/13 0800 04/13/14 1519  PROBNP 4643.0* 10172.0* 3909.0*     NSTEMI (non-ST elevated myocardial infarction) with 2 episodes a fib one with PNA and now one with bronchitis and NSTEMI, may be type 2, demand ischemia but with age stress test may be warranted vs. Cath     Hyponatremic- on lasix as outpt.    Elevated d dimer at 1.25    Anemia H/H 8.3/24.5    HTN on admit 213/85 now controlled to labile    CKD Gonzales  Nurse Practitioner Certified [place] Pager [phone] or after  5pm or weekends call [phone] 04/14/2014, 4:16 PM  I have seen and examined the patient along with Cecilie Kicks, NP.  I have reviewed the chart, notes and new data.  I agree with NP's note.  Key new complaints: diaphoresis and cough are her major complaintts; angina is not a prominent symptom Key examination changes: remains in SR, no clinical evidence of CHF, reduced breath sounds and crackles left lung base Key new findings / data: moderate to severe hyponatremia, worsening anemia; there are repolarization changes when in AF, but the ECG in SR is very benign; peak Trop I around 2  PLAN: Probably type 2 NSTEMI, but the likelihood of underlying CAD is high in the elderly lady with PAD. Normal LVEF on relatively recent echo. No angina.  Would not rush to invasive coronary evaluation due to the  numerous comorbid conditions. Especially, would be important to clarify cause and acuity of the anemia. Suggest a Lexiscan Myoview to quantify the extent of possible LV myocardium in jeopardy. If she has a large anterior reversible defect will need coronary angio once her anemia, pneumonia and hyponatremia are "fixed". If there is a small inferior defect and EF still normal, would favor medical management. If LVEF has deteriorated since January, would proceed to cath.  Until anemia diagnosis is clarified, do not recommend full anticoagulation. Long term, however, she should receive anticoagulation for stroke prevention if the bleeding risk is not high (CHADSVasc score is 5)   Sanda Klein, MD, [facility] [phone] 04/14/2014, 6:19 PM

## 2014-04-14 NOTE — Progress Notes (Signed)
Patient ID: Jenny [name]  female  WER:[id number]    DOB: 1927-07-22    DOA: 04/13/2014  PCP: Estill Dooms, MD  Assessment/Plan: Principal Problem:   Atrial fibrillation with RVR: Paroxysmal - Heart rate controlled, currently in normal sinus rhythm, Cardizem drip off - Cardiology consulted, will await recommendations - Currently not on any heparin drip, in NSR, obtain 2-D echocardiogram -TSH normal, check d-dimer   Active Problems:   NSTEMI (non-ST elevated myocardial infarction): No chest pain, Troponins trending down - Likely due to A. fib with RVR, continue aspirin - Heart rate in 70s, will place on Coreg, obtain lipid panel, Follow cardiology recommendations    Bronchitis, chronic - Recently treated for pneumonia, continue Xopenex nebs, Ceftin   Hyponatremia -Calculated osmolarity 263, obtain urine osmolarity, urine sodium, serum osm, recently had pneumonia, possibly has underlying SIADH, however has elevated BNP, was placed on Lasix - Placed on fluid restriction, follow labs    Hypothyroidism - TSH 1.69, continue Synthroid  anxiety, depression  Bipolar I disorder, most recent episode mixed - Continue Klonopin per her home regimen, continue Zoloft    GERD (gastroesophageal reflux disease)Continue PPI    Other dysphagia -Currently tolerating regular diet, if any issues, will obtain swallow evaluation    DVT Prophylaxis: Lovenox   Code Status: full code   Family Communication: discussed with the patient and her caregiver in the room   Disposition:  Consultants:  Cardiology   Procedures:  none   Antibiotics:  Ceftin     Subjective: Patient seen and examined, denies any chest pain or shortness of breath, no nausea vomiting . Off Cardizem drip, currently normal sinus rhythm  Objective: Weight change:   Intake/Output Summary (Last 24 hours) at 04/14/14 0930 Last data filed at 04/14/14 0900  Gross per 24 hour  Intake 146.08 ml  Output    900 ml    Net -753.92 ml   Blood pressure 161/50, pulse 81, temperature 98.5 F (36.9 C), temperature source Oral, resp. rate 27, height 5\' 4"  (1.626 m), weight 69.8 kg (153 lb 14.1 oz), SpO2 96.00%.  Physical Exam: General: Alert and awake, oriented x3, not in any acute distress. CVS: S1-S2 clear, no murmur rubs or gallops Chest: Decreased breath sounds at the bases  Abdomen: soft nontender, nondistended, normal bowel sounds  Extremities: no cyanosis, clubbing or edema noted bilaterally Neuro: Cranial nerves II-XII intact, no focal neurological deficits  Lab Results: Basic Metabolic Panel:  Recent Labs Lab 04/13/14 1519 04/14/14 0142  NA 122* 122*  K 4.3 3.9  CL 84* 86*  CO2 21 25  GLUCOSE 107* 143*  BUN 13 11  CREATININE 1.03 1.09  CALCIUM 8.9 8.3*   Liver Function Tests:  Recent Labs Lab 04/13/14 1519  AST 21  ALT 15  ALKPHOS 92  BILITOT <0.2*  PROT 6.4  ALBUMIN 3.1*   No results found for this basename: LIPASE, AMYLASE,  in the last 168 hours No results found for this basename: AMMONIA,  in the last 168 hours CBC:  Recent Labs Lab 04/13/14 1519 04/14/14 0142  WBC 7.7 8.9  NEUTROABS 4.8  --   HGB 9.2* 8.3*  HCT 26.5* 24.5*  MCV 81.8 82.5  PLT 589* 537*   Cardiac Enzymes:  Recent Labs Lab 04/13/14 1841 04/14/14 0142 04/14/14 0650  TROPONINI 2.18* 1.95* 1.69*   BNP: No components found with this basename: POCBNP,  CBG: No results found for this basename: GLUCAP,  in the last 168 hours  Micro Results: Recent Results (from the past 240 hour(s))  MRSA PCR SCREENING     Status: None   Collection Time    04/13/14  7:57 PM      Result Value Ref Range Status   MRSA by PCR NEGATIVE  NEGATIVE Final   Comment:            The GeneXpert MRSA Assay (FDA     approved for NASAL specimens     only), is one component of a     comprehensive MRSA colonization     surveillance program. It is not     intended to diagnose MRSA     infection nor to guide or      monitor treatment for     MRSA infections.    Studies/Results: Dg Chest 2 View  04/13/2014   CLINICAL DATA:  DEHYDRATION  EXAM: CHEST - 2 VIEW  COMPARISON:  11/15/2013  FINDINGS: Stable cardiomegaly. Improving left lower lung airspace disease, with mild scattered residual airspace opacities. There is some residual linear scarring or subsegmental atelectasis in the left mid lung. Right lung remains clear. Atheromatous aorta. . No effusion. Degenerative changes in bilateral shoulders.  IMPRESSION: 1. Partial improvement in the left lower lung airspace disease. 2. Stable cardiomegaly   Electronically Signed   By: Arne Cleveland M.D.   On: 04/13/2014 15:56   Dg Chest Port 1 View  04/14/2014   CLINICAL DATA:  Shortness of breath  EXAM: PORTABLE CHEST - 1 VIEW  COMPARISON:  04/13/2014  FINDINGS: Mild cardiomegaly which is stable from yesterday. Mediastinal contours are distorted by leftward rotation. There are streaky lung opacities at the bases, left more than right. No change from prior to suggest pneumonia, edema, effusion, or pneumothorax.  IMPRESSION: Unchanged bibasilar lung opacities, morphology favoring atelectasis.   Electronically Signed   By: Jorje Guild M.D.   On: 04/14/2014 07:08    Medications: Scheduled Meds: . aspirin  325 mg Oral Daily  . brimonidine  1 drop Both Eyes Q12H   And  . timolol  1 drop Both Eyes Q12H  . calcitonin (salmon)  1 spray Alternating Nares Daily  . calcium-vitamin D  1 tablet Oral TID  . cefUROXime  500 mg Oral BID WC  . clonazePAM  0.5 mg Oral QHS  . enoxaparin (LOVENOX) injection  40 mg Subcutaneous Q24H  . ferrous sulfate  325 mg Oral BID WC  . furosemide  20 mg Oral BID  . lamoTRIgine  200 mg Oral Daily  . levalbuterol  0.63 mg Nebulization Q6H  . levothyroxine  88 mcg Oral QAC breakfast  . pantoprazole  40 mg Oral Daily  . pilocarpine  5 mg Oral TID  . potassium chloride SA  20 mEq Oral BID  . saccharomyces boulardii  250 mg Oral BID  . senna   2 tablet Oral QHS  . sertraline  25 mg Oral Daily  . sodium chloride  3 mL Intravenous Q12H      LOS: 1 day   Valentin Benney Krystal Eaton M.D. [facility] 04/14/2014, 9:30 AM Pager: [phone]  If 7PM-7AM, please contact night-coverage [email or web address] Password [email or web address]  **Disclaimer: This note was dictated with voice recognition software. Similar sounding words can inadvertently be transcribed and this note may contain transcription errors which may not have been corrected upon publication of note.**

## 2014-04-15 ENCOUNTER — Ambulatory Visit (HOSPITAL_COMMUNITY): Admit: 2014-04-15 | Payer: Medicare Other

## 2014-04-15 ENCOUNTER — Encounter (HOSPITAL_COMMUNITY): Payer: Medicare Other

## 2014-04-15 ENCOUNTER — Other Ambulatory Visit: Payer: Self-pay | Admitting: Nurse Practitioner

## 2014-04-15 DIAGNOSIS — E871 Hypo-osmolality and hyponatremia: Secondary | ICD-10-CM

## 2014-04-15 DIAGNOSIS — I5032 Chronic diastolic (congestive) heart failure: Secondary | ICD-10-CM

## 2014-04-15 DIAGNOSIS — R195 Other fecal abnormalities: Secondary | ICD-10-CM

## 2014-04-15 DIAGNOSIS — I509 Heart failure, unspecified: Secondary | ICD-10-CM

## 2014-04-15 LAB — URINE CULTURE
CULTURE: NO GROWTH
Colony Count: NO GROWTH

## 2014-04-15 LAB — RETICULOCYTES
RBC.: 2.92 MIL/uL — ABNORMAL LOW (ref 3.87–5.11)
RETIC CT PCT: 3 % (ref 0.4–3.1)
Retic Count, Absolute: 87.6 10*3/uL (ref 19.0–186.0)

## 2014-04-15 LAB — CBC
HCT: 23.9 % — ABNORMAL LOW (ref 36.0–46.0)
Hemoglobin: 7.8 g/dL — ABNORMAL LOW (ref 12.0–15.0)
MCH: 27.7 pg (ref 26.0–34.0)
MCHC: 32.6 g/dL (ref 30.0–36.0)
MCV: 84.8 fL (ref 78.0–100.0)
PLATELETS: 509 10*3/uL — AB (ref 150–400)
RBC: 2.82 MIL/uL — ABNORMAL LOW (ref 3.87–5.11)
RDW: 14.1 % (ref 11.5–15.5)
WBC: 8.8 10*3/uL (ref 4.0–10.5)

## 2014-04-15 LAB — LEGIONELLA ANTIGEN, URINE: Legionella Antigen, Urine: NEGATIVE

## 2014-04-15 LAB — IRON AND TIBC
Iron: <10 ug/dL — ABNORMAL LOW (ref 42–135)
UIBC: 276 ug/dL (ref 125–400)

## 2014-04-15 LAB — FERRITIN: Ferritin: 58 ng/mL (ref 10–291)

## 2014-04-15 LAB — BASIC METABOLIC PANEL
BUN: 14 mg/dL (ref 6–23)
CHLORIDE: 93 meq/L — AB (ref 96–112)
CO2: 26 mEq/L (ref 19–32)
Calcium: 8.7 mg/dL (ref 8.4–10.5)
Creatinine, Ser: 1.02 mg/dL (ref 0.50–1.10)
GFR, EST AFRICAN AMERICAN: 57 mL/min — AB (ref >90)
GFR, EST NON AFRICAN AMERICAN: 49 mL/min — AB (ref >90)
Glucose, Bld: 106 mg/dL — ABNORMAL HIGH (ref 70–99)
POTASSIUM: 4.9 meq/L (ref 3.7–5.3)
Sodium: 128 mEq/L — ABNORMAL LOW (ref 137–147)

## 2014-04-15 LAB — VITAMIN B12: Vitamin B-12: 580 pg/mL (ref 211–911)

## 2014-04-15 LAB — PREPARE RBC (CROSSMATCH)

## 2014-04-15 LAB — FOLATE: Folate: >20 ng/mL

## 2014-04-15 MED ORDER — FUROSEMIDE 10 MG/ML IJ SOLN
20.0000 mg | Freq: Once | INTRAMUSCULAR | Status: AC
  Administered 2014-04-15: 20 mg via INTRAVENOUS
  Filled 2014-04-15: qty 2

## 2014-04-15 NOTE — Consult Note (Signed)
Consultation  Referring Provider: [facility] (Dr. Catalina Antigua)      Primary Care Physician:  Estill Dooms, MD Primary Gastroenterologist: unassigned        Reason for Consultation:  anemia           HPI:   Jenny [name] is a 80 y.o. female with multiple medical problems. She was admitted 2 days ago with fatigue, SOB, low sodium. CTA of chest negative for PE. Troponin elevated at 1.69. During process of evaluation patient found to be anemic  Patient was hospitalized early January with CAP and Afib with RVR. During that admission patient was found to be very anemic with heme positive stools.  Hard to determine baseline hgb but seems to be 11-12. Hgb was down to 8.0 at time of early January admission, it drifted further that hospitalization to 6.6. Patient got 2 U of blood with appropriate rise in hgb. Outpatient med list at that time contained a daily baby aspirin and anaprox. GI consult not requested that admission but patient apparently discharged home on iron.   Patient's hgb 04/07/14 was 9.1. In ED yesterday it was 9.2 but has drifted to 7.8 over last couple of days (without IVF). BUN normal. Patient taking daily 325mg  ASA. Patient nor daughter know if patient taking anaprox at assisted living facility though she does take ibuprofen as needed (? Frequency). Patient has no GI complaints such as abdominal pain, nausea. Her stools are dark on iron.   Past Medical History  Diagnosis Date  . Hypertension   . Coronary artery disease   . Heart murmur   . Hypothyroidism 05-02-12    tx. Synthroid  . Headache(784.0) 05-02-12    past hx. migrianes-many yrs ago  . Arthritis 05-02-12    osteoarthritis-rt. hip  . Pneumonia 05-02-12    mild case  . 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 Acute upper respiratory infections of unspecified site 2014  . Unspecified venous (peripheral) insufficiency 2013  . Senile osteoporosis 2013  . Unspecified closed fracture of pelvis 10/13/2012  . Cellulitis and  abscess of leg, except foot 2013  . Altered mental status 2013  . Chronic pain syndrome 2013  . Urinary tract infection, site not specified   . Hyposmolality and/or hyponatremia 05/2012  . Congestive heart failure, unspecified 05/2012  . Pneumonia, organism unspecified 05/2012  . Pressure ulcer, buttock(707.05) 05/2012  . Hypoxemia 05/2012  . Reflux esophagitis 3013  . Unspecified constipation 2013  . Acute posthemorrhagic anemia 05/2012  . Coronary atherosclerosis of native coronary artery 05/2012  . Pain in joint, pelvic region and thigh 04/2012  . Benign neoplasm of adrenal gland 10/2011  . Alzheimer's disease 06/2011  . Other seborrheic keratosis 2012  . Hypertensive renal disease, benign 2012  . Memory loss 09/2010  . Spinal stenosis, lumbar region, without neurogenic claudication 2011  . Unspecified vitamin D deficiency 2010  . Unspecified disorder of kidney and ureter 2010  . Congenital factor VIII disorder 2009  . Senile dementia with delusional features 2008  . Generalized hyperhidrosis 2007  . Irritable bowel syndrome 2007  . Unspecified chronic bronchitis 2006  . Depression 06/2005     depression only,tx. oral meds  . Anxiety 2004  . Unspecified late effects of cerebrovascular disease 2004  . Migraine with aura, without mention of intractable migraine without mention of status migrainosus 2003  . Tinnitus 2003  . Other symptoms involving cardiovascular system 2003  . Hyperlipidemia 2002    Past Surgical History  Procedure Laterality Date  . Tonsillectomy    . Tubal ligation  05-02-12  .  tummy tuck  05-02-12    many yrs ago  . Cataract extraction, bilateral  05-02-12    bilateral  . Retinal detachment surgery  05-02-12    left  . Total hip arthroplasty  05/09/2012    Procedure: TOTAL HIP ARTHROPLASTY ANTERIOR APPROACH;  Surgeon: Mcarthur Rossetti, MD;  Location: [facility];  Service: Orthopedics;  Laterality: Right;  . Dental surgery  08/11/13    3 teeth surgical removed  by Dr. Lewanda Rife    Family History  Problem Relation Age of Onset  . Diabetes Father    Maudry Diego had gastric cancer. No known colon cancer  History  Substance Use Topics  . Smoking status: Former Smoker -- 1.00 packs/day for 62 years    Types: Cigarettes    Quit date: 10/10/2008  . Smokeless tobacco: Never Used  . Alcohol Use: No    Prior to Admission medications   Medication Sig Start Date End Date Taking? Authorizing Provider  acetaminophen (TYLENOL) 325 MG tablet Take 325 mg by mouth daily as needed for mild pain. One daily as needed   Yes Historical Provider, MD  albuterol (PROVENTIL HFA;VENTOLIN HFA) 108 (90 BASE) MCG/ACT inhaler Inhale 2 puffs into the lungs every 6 (six) hours as needed for wheezing or shortness of breath.   Yes Historical Provider, MD  Ascorbic Acid (VITAMIN C) 1000 MG tablet Take 1,000 mg by mouth daily with breakfast.   Yes Historical Provider, MD  aspirin 325 MG tablet Take 1 tablet (325 mg total) by mouth daily. 11/20/13  Yes Myrtis Ser, MD  brimonidine-timolol (COMBIGAN) 0.2-0.5 % ophthalmic solution Place 1 drop into both eyes every 12 (twelve) hours.    Yes Historical Provider, MD  calcitonin, salmon, (MIACALCIN/FORTICAL) 200 UNIT/ACT nasal spray Place 1 spray into alternate nostrils daily. Use one spray in one nostril once daily. Alternate nostrils daily 07/23/13  Yes Tiffany L Reed, DO  calcium-vitamin D (OSCAL WITH D) 500-200 MG-UNIT per tablet Take 1 tablet by mouth 3 (three) times daily. Take one tablet three times daily for calcium supplementation 03/12/13  Yes Estill Dooms, MD  cefUROXime (CEFTIN) 500 MG tablet Take 500 mg by mouth 2 (two) times daily with a meal. For 10 days. 04/04/14  Yes Historical Provider, MD  clonazePAM (KLONOPIN) 0.25 MG disintegrating tablet Take 0.25 mg by mouth every 4 (four) hours as needed (breakthrough anxiety).   Yes Historical Provider, MD  clonazePAM (KLONOPIN) 0.5 MG tablet Take 0.5 mg by mouth at bedtime.   Yes  Historical Provider, MD  ferrous sulfate 325 (65 FE) MG tablet Take 325 mg by mouth 2 (two) times daily with a meal.   Yes Historical Provider, MD  furosemide (LASIX) 20 MG tablet Take 20 mg by mouth 2 (two) times daily.   Yes Historical Provider, MD  ibuprofen (ADVIL,MOTRIN) 200 MG tablet Take 400 mg by mouth 3 (three) times daily with meals.   Yes Historical Provider, MD  ipratropium-albuterol (DUONEB) 0.5-2.5 (3) MG/3ML SOLN Take 3 mLs by nebulization every 6 (six) hours as needed (cough/wheeze.). AND every 6 hours for 5 days starting 04/09/14.  (Last dose 04/14/14 at 0000.)   Yes Historical Provider, MD  lamoTRIgine (LAMICTAL) 200 MG tablet Take 200 mg by mouth daily.   Yes Historical Provider, MD  levothyroxine (SYNTHROID, LEVOTHROID) 88 MCG tablet Take 88 mcg by mouth daily before breakfast.   Yes Historical Provider, MD  LORazepam (ATIVAN) 0.5 MG tablet Take 0.5 mg by mouth every 4 (four) hours as needed for anxiety (IN THE EVENT THAT CLONAZEPAM IS UNAVAILABLE.).   Yes Historical Provider, MD  multivitamin-lutein (OCUVITE-LUTEIN) CAPS Take 1 capsule by mouth daily.   Yes Historical Provider, MD  naproxen sodium (ANAPROX) 220 MG tablet Take 220 mg by mouth daily as needed. Take one daily   Yes Historical Provider, MD  nitroGLYCERIN (NITROSTAT) 0.4 MG SL tablet Place 0.4 mg under the tongue every 5 (five) minutes as needed for chest pain.   Yes Historical Provider, MD  omeprazole (PRILOSEC) 20 MG capsule Take 20 mg by mouth 2 (two) times daily before a meal.   Yes Historical Provider, MD  omeprazole (PRILOSEC) 40 MG capsule Take 40 mg by mouth 4 (four) times daily.   Yes Historical Provider, MD  pilocarpine (SALAGEN) 5 MG tablet Take 5 mg by mouth 3 (three) times daily.   Yes Historical Provider, MD  potassium chloride SA (K-DUR,KLOR-CON) 20 MEQ tablet Take 20 mEq by mouth 2 (two) times daily.   Yes Historical Provider, MD  saccharomyces boulardii (FLORASTOR) 250 MG capsule Take 250 mg by mouth 2  (two) times daily. For 10 days. 04/04/14  Yes Historical Provider, MD  senna (SENOKOT) 8.6 MG TABS Take 2 tablets by mouth at bedtime.   Yes Historical Provider, MD  sertraline (ZOLOFT) 25 MG tablet Take 1 tablet (25 mg total) by mouth daily. 11/20/13  Yes Myrtis Ser, MD    Current Facility-Administered Medications  Medication Dose Route Frequency Provider Last Rate Last Dose  . 0.9 %  sodium chloride infusion  250 mL Intravenous PRN Theodis Blaze, MD 10 mL/hr at 04/13/14 2151 250 mL at 04/13/14 2151  . acetaminophen (TYLENOL) tablet 325 mg  325 mg Oral Daily PRN Theodis Blaze, MD      . brimonidine (ALPHAGAN) 0.2 % ophthalmic solution 1 drop  1 drop Both Eyes Q12H Theodis Blaze, MD   1 drop at 04/15/14 0938   And  . timolol (TIMOPTIC) 0.5 % ophthalmic solution 1 drop  1 drop Both Eyes Q12H Theodis Blaze, MD   1 drop at 04/15/14 [phone]  . calcitonin (salmon) (MIACALCIN/FORTICAL) nasal spray 1 spray  1 spray Alternating Nares Daily Theodis Blaze, MD   1 spray at 04/15/14 [phone]  . calcium-vitamin D (OSCAL WITH D) 500-200 MG-UNIT per tablet 1 tablet  1 tablet Oral TID Theodis Blaze, MD   1 tablet at 04/15/14 [phone]  . carvedilol (COREG) tablet 6.25 mg  6.25 mg Oral BID WC Ripudeep K Rai, MD   6.25 mg at 04/14/14 1806  . cefUROXime (CEFTIN) tablet 500 mg  500 mg Oral BID WC Theodis Blaze, MD   500 mg at 04/15/14 0806  . clonazepam (KLONOPIN) disintegrating tablet 0.25 mg  0.25 mg Oral Q4H PRN Theodis Blaze, MD   0.25 mg at 04/15/14 [id number]  . clonazePAM (KLONOPIN) tablet 0.5 mg  0.5 mg Oral QHS Theodis Blaze, MD   0.5 mg at 04/14/14 2143  . ferrous sulfate tablet 325 mg  325 mg Oral BID WC Theodis Blaze, MD   325 mg at 04/14/14 1806  . furosemide (LASIX) injection 20 mg  20 mg Intravenous Once Ripudeep K Rai, MD      . furosemide (LASIX) tablet 20 mg  20 mg Oral BID Theodis Blaze, MD   20 mg at 04/15/14 0806  . lamoTRIgine (LAMICTAL) tablet 200 mg  200 mg Oral Daily Theodis Blaze, MD   200 mg at 04/15/14  [id number]  . levalbuterol (XOPENEX) nebulizer solution 0.63 mg  0.63 mg Nebulization Q6H Ripudeep K Rai, MD   0.63 mg at 04/15/14 0821  . levothyroxine (SYNTHROID, LEVOTHROID) tablet 88 mcg  88 mcg Oral QAC breakfast Theodis Blaze, MD   88 mcg at 04/15/14 0806  . morphine 2 MG/ML injection 1 mg  1 mg Intravenous Q4H PRN Theodis Blaze, MD      . nitroGLYCERIN (NITROSTAT) SL tablet 0.4 mg  0.4 mg Sublingual Q5 min PRN Theodis Blaze, MD      . ondansetron [facility]) tablet 4 mg  4 mg Oral Q6H PRN Theodis Blaze, MD       Or  . ondansetron [facility]) injection 4 mg  4 mg Intravenous Q6H PRN Theodis Blaze, MD      . oxyCODONE (Oxy IR/ROXICODONE) immediate release tablet 5 mg  5 mg Oral Q4H PRN Theodis Blaze, MD      . pantoprazole (PROTONIX) EC tablet 40 mg  40 mg Oral Daily Theodis Blaze, MD   40 mg at 04/15/14 1191  . pilocarpine (SALAGEN) tablet 5 mg  5 mg Oral TID Theodis Blaze, MD   5 mg at 04/15/14 4782  . potassium chloride SA (K-DUR,KLOR-CON) CR tablet 20 mEq  20 mEq Oral BID Theodis Blaze, MD   20 mEq at 04/15/14 9562  . saccharomyces boulardii (FLORASTOR) capsule 250 mg  250 mg Oral BID Theodis Blaze, MD   250 mg at 04/15/14 1308  . senna (SENOKOT) tablet 17.2 mg  2 tablet Oral QHS Theodis Blaze, MD   17.2 mg at 04/14/14 2142  . sertraline (ZOLOFT) tablet 25 mg  25 mg Oral Daily Theodis Blaze, MD   25 mg at 04/15/14 6578  . sodium chloride 0.9 % injection 3 mL  3 mL Intravenous Q12H Theodis Blaze, MD   3 mL at 04/14/14 1049  . sodium chloride 0.9 % injection 3 mL  3 mL Intravenous PRN Theodis Blaze, MD        Allergies as of 04/13/2014 - Review Complete 04/13/2014  Allergen Reaction Noted  . Levaquin [levofloxacin in d5w] Hives 11/15/2013  . Penicillins Rash 07/03/2005   Review of Systems:    All systems reviewed and negative except where noted in HPI.   Physical Exam:  Vital signs in last 24 hours: Temp:  [97.2 F (36.2 C)-98.4 F (36.9 C)] 97.2 F (36.2 C) (06/04 0800) Pulse Rate:   [69-76] 71 (06/04 0647) Resp:  [18-27] 22 (06/04 0647) BP: (104-166)/(34-56) 151/37 mmHg (06/04 0319) SpO2:  [86 %-100 %] 97 % (06/04 0822) Weight:  [149 lb 7.6 oz (67.8 kg)] 149 lb 7.6 oz (67.8 kg) (06/04 0614) Last BM Date: 04/13/14 General:   Pleasant white female in NAD Head:  Normocephalic and atraumatic. Eyes:   No icterus.   Conjunctiva pale. Ears:  Normal auditory acuity. Neck:  Supple; no masses felt Lungs:  Respirations even and unlabored. Lungs clear to auscultation bilaterally.   No wheezes, crackles, or rhonchi.  Heart:  Regular rate and rhythm. Abdomen:  Soft, nondistended, nontender. Normal bowel sounds. No appreciable masses or hepatomegaly.  Rectal:  Small hemorrhoidal tags. Stool dark, heme positive   Msk:  Symmetrical without gross deformities.  Extremities:  Without edema. Neurologic:  Alert and  oriented x4;  grossly normal neurologically. Skin:  Intact without significant  lesions or rashes. Cervical Nodes:  No significant cervical adenopathy. Psych:  Alert and cooperative. Normal affect.  LAB RESULTS:  Recent Labs  04/13/14 1519 04/14/14 0142 04/15/14 0306  WBC 7.7 8.9 8.8  HGB 9.2* 8.3* 7.8*  HCT 26.5* 24.5* 23.9*  PLT 589* 537* 509*   BMET  Recent Labs  04/13/14 1519 04/14/14 0142 04/15/14 0306  NA 122* 122* 128*  K 4.3 3.9 4.9  CL 84* 86* 93*  CO2 21 25 26   GLUCOSE 107* 143* 106*  BUN 13 11 14   CREATININE 1.03 1.09 1.02  CALCIUM 8.9 8.3* 8.7   LFT  Recent Labs  04/13/14 1519  PROT 6.4  ALBUMIN 3.1*  AST 21  ALT 15  ALKPHOS 92  BILITOT <0.2*    STUDIES: Dg Chest 2 View  04/13/2014   CLINICAL DATA:  DEHYDRATION  EXAM: CHEST - 2 VIEW  COMPARISON:  11/15/2013  FINDINGS: Stable cardiomegaly. Improving left lower lung airspace disease, with mild scattered residual airspace opacities. There is some residual linear scarring or subsegmental atelectasis in the left mid lung. Right lung remains clear. Atheromatous aorta. . No effusion.  Degenerative changes in bilateral shoulders.  IMPRESSION: 1. Partial improvement in the left lower lung airspace disease. 2. Stable cardiomegaly   Electronically Signed   By: Arne Cleveland M.D.   On: 04/13/2014 15:56   Ct Angio Chest Pe W/cm &/or Wo Cm  04/14/2014   CLINICAL DATA:  Acute respiratory failure with shortness of breath. Evaluate for pulmonary embolism.  EXAM: CT ANGIOGRAPHY CHEST WITH CONTRAST  TECHNIQUE: Multidetector CT imaging of the chest was performed using the standard protocol during bolus administration of intravenous contrast. Multiplanar CT image reconstructions and MIPs were obtained to evaluate the vascular anatomy.  CONTRAST:  136mL OMNIPAQUE IOHEXOL 350 MG/ML SOLN  COMPARISON:  10/31/2011  FINDINGS: THORACIC INLET/BODY WALL:  No acute abnormality.  MEDIASTINUM:  Mild cardiomegaly. No pericardial effusion. Diffuse atherosclerotic calcification, including the coronary arteries. There is intermittent respiratory motion, which limits sensitivity for detecting pulmonary embolus at the affected levels. Exam is overall diagnostic, and there is no pulmonary embolism identified. No aortic aneurysm or dissection. No lymphadenopathy. Negative esophagus.  LUNG WINDOWS:  There is a small, layering left pleural effusion. The fluid is water density. There is mild associated atelectasis. Although limited by motion artifact, there is a nodule in the upper lingula which measures 7 mm. 13 nodular density in the posterior basilar segment left lower lobe, subpleural. In the medial basilar segment left lower lobe on image 52, there is a 12 mm nodule. Centrilobular emphysema.  UPPER ABDOMEN:  A soft tissue density nodule arising from the left adrenal gland is stable in size, up to 3.5 cm. Stability is consistent with benign lesion, most likely lipid poor adenoma.  OSSEOUS:  No acute fracture.  No suspicious lytic or blastic lesions.  Review of the MIP images confirms the above findings.  IMPRESSION: 1.  Negative for pulmonary embolism. 2. Small left pleural effusion. 3. Three left lung nodules, measuring up to 13 mm. Bronchogenic carcinoma is a diagnostic possibility, recommend follow-up chest CT in 3 months. 4. Emphysema.   Electronically Signed   By: Jorje Guild M.D.   On: 04/14/2014 21:12   Dg Chest Port 1 View  04/14/2014   CLINICAL DATA:  Shortness of breath  EXAM: PORTABLE CHEST - 1 VIEW  COMPARISON:  04/13/2014  FINDINGS: Mild cardiomegaly which is stable from yesterday. Mediastinal contours are distorted by leftward rotation. There are  streaky lung opacities at the bases, left more than right. No change from prior to suggest pneumonia, edema, effusion, or pneumothorax.  IMPRESSION: Unchanged bibasilar lung opacities, morphology favoring atelectasis.   Electronically Signed   By: Jorje Guild M.D.   On: 04/14/2014 07:08   PREVIOUS ENDOSCOPIES:            none   Impression / Plan:   39. 79 year old female with normocytic anemia and heme positive stool. She was transfused during January 2015 admission but hgb has drifted back down and is she is persistently heme positive. Patient has no upper or lower GI symptoms. Spoke at length to patient and daughter. Patient has some short term memory problems ( ? history of dementia) and keeps asking about consequences of NOT proceeding with endoscopic workup. Daughter has discussed with sibling and they would like to pursue EGD (since NSAIDS increase her risk for PUD). Will plan for EGD tomorrow afternoon. Family understands that we cannot exclude colon pathology with EGD alone. A CTscan might identify a colon neoplasm if EGD normal and patient / family choose not to pursue colonoscopy.   2. Elevated troponin. Cardiology feels this is likely NSTEMI.  Cannot be fully anticoagulated right now secondary #1. She is for Myoview in am (postponed until anemia improved) and may need cath depending on results. Cardiology wants anemia worked up  3. Multiple  medical problems as listed in [place]   Thanks   LOS: 2 days   Jenny [name]  04/15/2014, 10:34 AM  Attending MD note:   I have taken a history, examined the patient, and reviewed the chart. I agree with the Advanced Practitioner's impression and recommendations. Have discussed EGD/colon with pt and her daughter who has the power of the attorney. Pt is confused unable to decide. Heme positive profound iron deficiency anemia which has impacted her CV status and need to identify source of bleeding i n order to determine if anticoagulation feasible. We will start with EGD tomorrow,( NSAID ? Gastropathy?) then decide if colonoscopy  Will be pursued.  [facility] Pager # [phone]

## 2014-04-15 NOTE — Progress Notes (Signed)
Clinical Social Work Department BRIEF PSYCHOSOCIAL ASSESSMENT 04/15/2014  Patient:  Jenny [name], Jenny [name]     Account Number:  [id number]     Admit date:  04/13/2014  Clinical Social Worker:  Renold Genta  Date/Time:  04/15/2014 03:50 PM  Referred by:  Physician  Date Referred:  04/15/2014 Referred for  Other - See comment   Other Referral:   Admitted from: [place] type:  Patient Other interview type:   along with husband & daughter at bedside    PSYCHOSOCIAL DATA Living Status:  FACILITY Admitted from facility:  [facility] Level of care:  Assisted Living Primary support name:  Jenny [name] (husband) h#: [phone] c#: [phone] Primary support relationship to patient:  SPOUSE Degree of support available:   good    CURRENT CONCERNS Current Concerns  Post-Acute Placement   Other Concerns:    SOCIAL WORK ASSESSMENT / PLAN CSW received consult that patient was admitted from [name].   Assessment/plan status:  Information/Referral to [employer] Other assessment/ plan:   Information/referral to community resources:   CSW completed FL2 and faxed information to [facility] - confirmed with Dia Crawford that they will try to hold a bed for patient in their SNF.    PATIENT'S/FAMILY'S RESPONSE TO PLAN OF CARE: Patient informed CSW that she had moved into [name] in January and has been pleased so far with the care she's been receiving there.       Raynaldo Opitz, [facility] Clinical Social Worker cell #: [phone]

## 2014-04-15 NOTE — Progress Notes (Signed)
Received from ICU, pt alert with ongoing PRBC infusing @ 150cc/hr. Agreed with previous Rn's assessment.

## 2014-04-15 NOTE — Clinical Documentation Improvement (Signed)
Please clarify acuity of diastolic CHF. Thank you.  Possible Clinical Conditions?  Chronic Diastolic Congestive Heart Failure Acute Diastolic Congestive Heart Failure Acute on Chronic Diastolic Congestive Heart Failure Other Condition________________________________________ Cannot Clinically Determine  Supporting Information:  Risk Factors: H&P: Diastolic CHF, grade II - based on last 2 D ECHO in Jan 2015 Cards consult: Congestive heart failure, unspecified -most likely diastolic History CHF Signs & Symptoms: H&P:  presented to [facility] ED with main concern of several days duration of progressively worsening fatigue, lethargy, shortness of breath. Pt apparently has chronic dyspnea but slightly worse over the past several days.  Pulmonary: Effort and breath sounds normal, no stridor, rhonchi, diminished breath sounds at bases  Musculoskeletal: Normal range of motion. No edema and no tenderness  Diagnostics: BNP (last 3 results)  Recent Labs   11/15/13 1325  11/17/13 0800  04/13/14 1519   PROBNP  4643.0*  10172.0*  3909.0*   6/2 CXR:  FINDINGS:  Mild cardiomegaly which is stable from yesterday. Mediastinal  contours are distorted by leftward rotation. There are streaky lung  opacities at the bases, left more than right. No change from prior  to suggest pneumonia, edema, effusion, or pneumothorax.  IMPRESSION:  Unchanged bibasilar lung opacities, morphology favoring atelectasis.  Treatment: IV Lasix 20mg  x 1 dose 6/4 PO Lasix 20mg  bid  I&O qshift Daily weights   Thank You, Estella Husk ,RN Clinical Documentation Specialist:  [phone]  [place] Information Management

## 2014-04-15 NOTE — Progress Notes (Addendum)
Patient ID: Jenny [name]  female  [id number]    DOB: 1927/01/26    DOA: 04/13/2014  PCP: Estill Dooms, MD  Assessment/Plan: Principal Problem:   Atrial fibrillation with RVR: Paroxysmal - Heart rate controlled, currently in normal sinus rhythm, Cardizem drip off - Cardiology consulted, followed conditions - Not on any anticoagulation at this time due to anemia, unknown cause, CHADS 5 - Echo done outpatient EF 65%. LVef normal, no wma, no valvular abn   -TSH normal,  d-dimer was elevated, CT angiography chest negative for pulmonary embolism  Active Problems:   NSTEMI (non-ST elevated myocardial infarction), acute on chronic diastolic CHF: No chest pain, Troponins trending down. CHF likely precipitated due to rapid A. Fib, - negative 1.45 L on the output - Will hold aspirin, Lovenox due to anemia, possible occult GI bleed - Stress Myoview test canceled today due to anemia, n.p.o. after midnight   Anemia: - Unclear etiology, ordered stool occult test, anemia panel. (Patient was previously admitted in July 2015 and she had similar presentation with anemia, hemoglobin had trended down to 6 and patient received 2 units of packed RBCs, however was attributed to hemodilution due to IV fluid resuscitation. Patient had no overt GI bleed during that admission).  - If patient were to place on anticoagulation due to atrial fibrillation, she will need to have GI etiology ruled out, I have called for GI consultation. Discussed in detail with patient's daughter, Izora Gala, could not recall when patient ever had last endoscopy or GI workup. - Continue PPI, transfuse 2 units packed RBCs    Bronchitis, chronic - Recently treated for pneumonia, continue Xopenex nebs, Ceftin  Hyponatremia -Serum osmolarity 260, urine loss D1 29, urine sodium 22, currently improving, follow up BMET    Hypothyroidism - TSH 1.69, continue Synthroid  anxiety, depression  Bipolar I disorder, most recent episode mixed -  Continue Klonopin per her home regimen, continue Zoloft    GERD (gastroesophageal reflux disease)Continue PPI    Other dysphagia -Currently tolerating regular diet, if any issues, will obtain swallow evaluation    DVT Prophylaxis: Lovenox   Code Status: full code   Family Communication: discussed with the patient and her daughter, Izora Gala in detail.  Disposition: Transfer to telemetry floor  Consultants:  Cardiology   GI called today  Procedures:  none   Antibiotics:  Ceftin     Subjective: Patient seen and examined, denies any chest pain or shortness of breath, no nausea vomiting . Coughing but no fevers chills or any productive phlegm   Objective: Weight change: -0.2 kg (-7.1 oz)  Intake/Output Summary (Last 24 hours) at 04/15/14 0715 Last data filed at 04/15/14 0600  Gross per 24 hour  Intake   1400 ml  Output   2075 ml  Net   -675 ml   Blood pressure 151/37, pulse 71, temperature 97.2 F (36.2 C), temperature source Axillary, resp. rate 22, height 5\' 4"  (1.626 m), weight 67.8 kg (149 lb 7.6 oz), SpO2 86.00%.  Physical Exam: General: Alert and awake, oriented x3, not in any acute distress. CVS: S1-S2 clear, no murmur rubs or gallops Chest: Decreased breath sounds at the bases  Abdomen: soft nontender, nondistended, normal bowel sounds  Extremities: no cyanosis, clubbing or edema noted bilaterally  Lab Results: Basic Metabolic Panel:  Recent Labs Lab 04/14/14 0142 04/15/14 0306  NA 122* 128*  K 3.9 4.9  CL 86* 93*  CO2 25 26  GLUCOSE 143* 106*  BUN 11 14  CREATININE 1.09 1.02  CALCIUM 8.3* 8.7   Liver Function Tests:  Recent Labs Lab 04/13/14 1519  AST 21  ALT 15  ALKPHOS 92  BILITOT <0.2*  PROT 6.4  ALBUMIN 3.1*   No results found for this basename: LIPASE, AMYLASE,  in the last 168 hours No results found for this basename: AMMONIA,  in the last 168 hours CBC:  Recent Labs Lab 04/13/14 1519 04/14/14 0142 04/15/14 0306  WBC  7.7 8.9 8.8  NEUTROABS 4.8  --   --   HGB 9.2* 8.3* 7.8*  HCT 26.5* 24.5* 23.9*  MCV 81.8 82.5 84.8  PLT 589* 537* 509*   Cardiac Enzymes:  Recent Labs Lab 04/13/14 1841 04/14/14 0142 04/14/14 0650  TROPONINI 2.18* 1.95* 1.69*   BNP: No components found with this basename: POCBNP,  CBG: No results found for this basename: GLUCAP,  in the last 168 hours   Micro Results: Recent Results (from the past 240 hour(s))  MRSA PCR SCREENING     Status: None   Collection Time    04/13/14  7:57 PM      Result Value Ref Range Status   MRSA by PCR NEGATIVE  NEGATIVE Final   Comment:            The GeneXpert MRSA Assay (FDA     approved for NASAL specimens     only), is one component of a     comprehensive MRSA colonization     surveillance program. It is not     intended to diagnose MRSA     infection nor to guide or     monitor treatment for     MRSA infections.    Studies/Results: Dg Chest 2 View  04/13/2014   CLINICAL DATA:  DEHYDRATION  EXAM: CHEST - 2 VIEW  COMPARISON:  11/15/2013  FINDINGS: Stable cardiomegaly. Improving left lower lung airspace disease, with mild scattered residual airspace opacities. There is some residual linear scarring or subsegmental atelectasis in the left mid lung. Right lung remains clear. Atheromatous aorta. . No effusion. Degenerative changes in bilateral shoulders.  IMPRESSION: 1. Partial improvement in the left lower lung airspace disease. 2. Stable cardiomegaly   Electronically Signed   By: Arne Cleveland M.D.   On: 04/13/2014 15:56   Dg Chest Port 1 View  04/14/2014   CLINICAL DATA:  Shortness of breath  EXAM: PORTABLE CHEST - 1 VIEW  COMPARISON:  04/13/2014  FINDINGS: Mild cardiomegaly which is stable from yesterday. Mediastinal contours are distorted by leftward rotation. There are streaky lung opacities at the bases, left more than right. No change from prior to suggest pneumonia, edema, effusion, or pneumothorax.  IMPRESSION: Unchanged  bibasilar lung opacities, morphology favoring atelectasis.   Electronically Signed   By: Jorje Guild M.D.   On: 04/14/2014 07:08    Medications: Scheduled Meds: . aspirin  325 mg Oral Daily  . brimonidine  1 drop Both Eyes Q12H   And  . timolol  1 drop Both Eyes Q12H  . calcitonin (salmon)  1 spray Alternating Nares Daily  . calcium-vitamin D  1 tablet Oral TID  . carvedilol  6.25 mg Oral BID WC  . cefUROXime  500 mg Oral BID WC  . clonazePAM  0.5 mg Oral QHS  . enoxaparin (LOVENOX) injection  40 mg Subcutaneous Q24H  . ferrous sulfate  325 mg Oral BID WC  . furosemide  20 mg Oral BID  . lamoTRIgine  200 mg Oral Daily  .  levalbuterol  0.63 mg Nebulization Q6H  . levothyroxine  88 mcg Oral QAC breakfast  . pantoprazole  40 mg Oral Daily  . pilocarpine  5 mg Oral TID  . potassium chloride SA  20 mEq Oral BID  . saccharomyces boulardii  250 mg Oral BID  . senna  2 tablet Oral QHS  . sertraline  25 mg Oral Daily  . sodium chloride  3 mL Intravenous Q12H      LOS: 2 days   Ripudeep Krystal Eaton M.D. [facility] 04/15/2014, 7:15 AM Pager: [phone]  If 7PM-7AM, please contact night-coverage [email or web address] Password [email or web address]  **Disclaimer: This note was dictated with voice recognition software. Similar sounding words can inadvertently be transcribed and this note may contain transcription errors which may not have been corrected upon publication of note.**

## 2014-04-15 NOTE — Progress Notes (Signed)
SUBJECTIVE:  No compalints  OBJECTIVE:   Vitals:   Filed Vitals:   04/15/14 0646 04/15/14 0647 04/15/14 0800 04/15/14 0822  BP:      Pulse: 71 71    Temp:   97.2 F (36.2 C)   TempSrc:   Oral   Resp: 22 22    Height:      Weight:      SpO2: 86% 86%  97%   I&O's:   Intake/Output Summary (Last 24 hours) at 04/15/14 0842 Last data filed at 04/15/14 0800  Gross per 24 hour  Intake   1230 ml  Output   2075 ml  Net   -845 ml   TELEMETRY: Reviewed telemetry pt in NSR:     PHYSICAL EXAM General: Well developed, well nourished, in no acute distress Head: Eyes PERRLA, No xanthomas.   Normal cephalic and atramatic  Lungs:   Clear bilaterally to auscultation and percussion. Heart:   HRRR S1 S2 Pulses are 2+ & equal. Abdomen: Bowel sounds are positive, abdomen soft and non-tender without masses  Extremities:   No clubbing, cyanosis or edema.  DP +1 Neuro: Alert and oriented X 3. Psych:  Good affect, responds appropriately   LABS: Basic Metabolic Panel:  Recent Labs  04/14/14 0142 04/15/14 0306  NA 122* 128*  K 3.9 4.9  CL 86* 93*  CO2 25 26  GLUCOSE 143* 106*  BUN 11 14  CREATININE 1.09 1.02  CALCIUM 8.3* 8.7   Liver Function Tests:  Recent Labs  04/13/14 1519  AST 21  ALT 15  ALKPHOS 92  BILITOT <0.2*  PROT 6.4  ALBUMIN 3.1*   No results found for this basename: LIPASE, AMYLASE,  in the last 72 hours CBC:  Recent Labs  04/13/14 1519 04/14/14 0142 04/15/14 0306  WBC 7.7 8.9 8.8  NEUTROABS 4.8  --   --   HGB 9.2* 8.3* 7.8*  HCT 26.5* 24.5* 23.9*  MCV 81.8 82.5 84.8  PLT 589* 537* 509*   Cardiac Enzymes:  Recent Labs  04/13/14 1841 04/14/14 0142 04/14/14 0650  TROPONINI 2.18* 1.95* 1.69*   BNP: No components found with this basename: POCBNP,  D-Dimer:  Recent Labs  04/14/14 1054  DDIMER 1.25*   Hemoglobin A1C: No results found for this basename: HGBA1C,  in the last 72 hours Fasting Lipid Panel:  Recent Labs  04/14/14 1054    CHOL 141  HDL 37*  LDLCALC 79  TRIG 126  CHOLHDL 3.8   Thyroid Function Tests:  Recent Labs  04/13/14 1841  TSH 1.690   Anemia Panel:  Recent Labs  04/15/14 0817  RETICCTPCT 3.0   Coag Panel:   Lab Results  Component Value Date   INR 1.22 10/13/2012   INR 1.10 10/12/2012   INR 1.07 10/11/2012    RADIOLOGY: Dg Chest 2 View  04/13/2014   CLINICAL DATA:  DEHYDRATION  EXAM: CHEST - 2 VIEW  COMPARISON:  11/15/2013  FINDINGS: Stable cardiomegaly. Improving left lower lung airspace disease, with mild scattered residual airspace opacities. There is some residual linear scarring or subsegmental atelectasis in the left mid lung. Right lung remains clear. Atheromatous aorta. . No effusion. Degenerative changes in bilateral shoulders.  IMPRESSION: 1. Partial improvement in the left lower lung airspace disease. 2. Stable cardiomegaly   Electronically Signed   By: Arne Cleveland M.D.   On: 04/13/2014 15:56   Ct Angio Chest Pe W/cm &/or Wo Cm  04/14/2014   CLINICAL DATA:  Acute respiratory failure  with shortness of breath. Evaluate for pulmonary embolism.  EXAM: CT ANGIOGRAPHY CHEST WITH CONTRAST  TECHNIQUE: Multidetector CT imaging of the chest was performed using the standard protocol during bolus administration of intravenous contrast. Multiplanar CT image reconstructions and MIPs were obtained to evaluate the vascular anatomy.  CONTRAST:  135mL OMNIPAQUE IOHEXOL 350 MG/ML SOLN  COMPARISON:  10/31/2011  FINDINGS: THORACIC INLET/BODY WALL:  No acute abnormality.  MEDIASTINUM:  Mild cardiomegaly. No pericardial effusion. Diffuse atherosclerotic calcification, including the coronary arteries. There is intermittent respiratory motion, which limits sensitivity for detecting pulmonary embolus at the affected levels. Exam is overall diagnostic, and there is no pulmonary embolism identified. No aortic aneurysm or dissection. No lymphadenopathy. Negative esophagus.  LUNG WINDOWS:  There is a small,  layering left pleural effusion. The fluid is water density. There is mild associated atelectasis. Although limited by motion artifact, there is a nodule in the upper lingula which measures 7 mm. 13 nodular density in the posterior basilar segment left lower lobe, subpleural. In the medial basilar segment left lower lobe on image 52, there is a 12 mm nodule. Centrilobular emphysema.  UPPER ABDOMEN:  A soft tissue density nodule arising from the left adrenal gland is stable in size, up to 3.5 cm. Stability is consistent with benign lesion, most likely lipid poor adenoma.  OSSEOUS:  No acute fracture.  No suspicious lytic or blastic lesions.  Review of the MIP images confirms the above findings.  IMPRESSION: 1. Negative for pulmonary embolism. 2. Small left pleural effusion. 3. Three left lung nodules, measuring up to 13 mm. Bronchogenic carcinoma is a diagnostic possibility, recommend follow-up chest CT in 3 months. 4. Emphysema.   Electronically Signed   By: Jorje Guild M.D.   On: 04/14/2014 21:12   Dg Chest Port 1 View  04/14/2014   CLINICAL DATA:  Shortness of breath  EXAM: PORTABLE CHEST - 1 VIEW  COMPARISON:  04/13/2014  FINDINGS: Mild cardiomegaly which is stable from yesterday. Mediastinal contours are distorted by leftward rotation. There are streaky lung opacities at the bases, left more than right. No change from prior to suggest pneumonia, edema, effusion, or pneumothorax.  IMPRESSION: Unchanged bibasilar lung opacities, morphology favoring atelectasis.   Electronically Signed   By: Jorje Guild M.D.   On: 04/14/2014 07:08   Assessment/Plan  Principal Problem:  Atrial fibrillation with RVR, though was in SR on admit. Now back in SR after dilt. On ASA - on coreg now but use with caution as pt had bradycardia in the 40s once she went back to SR in 11/2013- tachy/brady syndrome  Active Problems:  Bronchitis, chronic  Hypothyroidism-TSH 1.69  CAD (coronary artery disease)- presumed no  documentation of CAD She does have PVD  Bipolar I disorder, most recent episode mixed  GERD (gastroesophageal reflux disease)  Other dysphagia  Congestive heart failure, unspecified -most likely diastolic  NSTEMI (non-ST elevated myocardial infarction) with 2 episodes a fib one with PNA and now one with bronchitis and NSTEMI, may be type 2, demand ischemia but with age stress test may be warranted vs. Cath  Hyponatremic- on lasix as outpt.  Elevated d dimer at 1.25  Anemia H/H 8.3/24.5  HTN on admit 213/85 now controlled to labile  CKD 3   PLAN:  Probably type 2 NSTEMI, but the likelihood of underlying CAD is high in the elderly lady with PAD. Normal LVEF on relatively recent echo. No angina.  Would not rush to invasive coronary evaluation due to the numerous comorbid  conditions. Especially, would be important to clarify cause and acuity of the anemia. Suggest a Lexiscan Myoview to quantify the extent of possible LV myocardium in jeopardy. If she has a large anterior reversible defect will need coronary angio once her anemia, pneumonia and hyponatremia are "fixed". If there is a small inferior defect and EF still normal, would favor medical management. If LVEF has deteriorated since January, would proceed to cath.  Until anemia diagnosis is clarified, do not recommend full anticoagulation. Long term, however, she should receive anticoagulation for stroke prevention if the bleeding risk is not high (CHADSVasc score is 5)  Will wait for anemia to improve before proceeding with stress test.  Need to determine source of low Hbg Consider decreasing ASA to 81mg  or stopping altogether for now in setting of low hbg  Sueanne Margarita, MD  04/15/2014  8:42 AM

## 2014-04-16 ENCOUNTER — Encounter (HOSPITAL_COMMUNITY): Payer: Self-pay

## 2014-04-16 ENCOUNTER — Ambulatory Visit (HOSPITAL_COMMUNITY)
Admit: 2014-04-16 | Discharge: 2014-04-16 | Disposition: A | Discharge status: Home / Self Care | Payer: Medicare Other | Attending: Cardiology | Admitting: Cardiology

## 2014-04-16 ENCOUNTER — Other Ambulatory Visit: Payer: Self-pay

## 2014-04-16 ENCOUNTER — Encounter (HOSPITAL_COMMUNITY)
Admission: EM | Disposition: A | Discharge status: Skilled Nursing Facility | Payer: Self-pay | Source: Home / Self Care | Attending: Internal Medicine

## 2014-04-16 DIAGNOSIS — I214 Non-ST elevation (NSTEMI) myocardial infarction: Secondary | ICD-10-CM

## 2014-04-16 HISTORY — PX: ESOPHAGOGASTRODUODENOSCOPY: SHX5428

## 2014-04-16 LAB — TYPE AND SCREEN
ABO/RH(D): O POS
Antibody Screen: NEGATIVE
UNIT DIVISION: 0
Unit division: 0

## 2014-04-16 LAB — BASIC METABOLIC PANEL
BUN: 17 mg/dL (ref 6–23)
CALCIUM: 8.9 mg/dL (ref 8.4–10.5)
CO2: 26 meq/L (ref 19–32)
CREATININE: 1.05 mg/dL (ref 0.50–1.10)
Chloride: 91 mEq/L — ABNORMAL LOW (ref 96–112)
GFR calc Af Amer: 55 mL/min — ABNORMAL LOW (ref >90)
GFR calc non Af Amer: 47 mL/min — ABNORMAL LOW (ref >90)
GLUCOSE: 116 mg/dL — AB (ref 70–99)
Potassium: 4.4 mEq/L (ref 3.7–5.3)
Sodium: 130 mEq/L — ABNORMAL LOW (ref 137–147)

## 2014-04-16 LAB — CBC
HEMATOCRIT: 36.7 % (ref 36.0–46.0)
HEMOGLOBIN: 12.7 g/dL (ref 12.0–15.0)
MCH: 28.7 pg (ref 26.0–34.0)
MCHC: 34.6 g/dL (ref 30.0–36.0)
MCV: 82.8 fL (ref 78.0–100.0)
Platelets: 442 10*3/uL — ABNORMAL HIGH (ref 150–400)
RBC: 4.43 MIL/uL (ref 3.87–5.11)
RDW: 14.1 % (ref 11.5–15.5)
WBC: 13.7 10*3/uL — ABNORMAL HIGH (ref 4.0–10.5)

## 2014-04-16 SURGERY — EGD (ESOPHAGOGASTRODUODENOSCOPY)
Anesthesia: Moderate Sedation

## 2014-04-16 MED ORDER — BENZONATATE 100 MG PO CAPS
100.0000 mg | ORAL_CAPSULE | Freq: Three times a day (TID) | ORAL | Status: DC
  Administered 2014-04-16 – 2014-04-21 (×13): 100 mg via ORAL
  Filled 2014-04-16 (×19): qty 1

## 2014-04-16 MED ORDER — WHITE PETROLATUM GEL
Status: DC | PRN
  Filled 2014-04-16: qty 5

## 2014-04-16 MED ORDER — BUTAMBEN-TETRACAINE-BENZOCAINE 2-2-14 % EX AERO
INHALATION_SPRAY | CUTANEOUS | Status: DC | PRN
  Administered 2014-04-16: 2 via TOPICAL

## 2014-04-16 MED ORDER — MENTHOL 3 MG MT LOZG
1.0000 | LOZENGE | OROMUCOSAL | Status: DC | PRN
  Filled 2014-04-16: qty 9

## 2014-04-16 MED ORDER — MIDAZOLAM HCL 10 MG/2ML IJ SOLN
INTRAMUSCULAR | Status: AC
  Filled 2014-04-16: qty 2

## 2014-04-16 MED ORDER — LEVALBUTEROL HCL 0.63 MG/3ML IN NEBU
0.6300 mg | INHALATION_SOLUTION | Freq: Three times a day (TID) | RESPIRATORY_TRACT | Status: DC
  Administered 2014-04-17 – 2014-04-18 (×2): 0.63 mg via RESPIRATORY_TRACT
  Filled 2014-04-16 (×8): qty 3

## 2014-04-16 MED ORDER — TECHNETIUM TC 99M SESTAMIBI GENERIC - CARDIOLITE
10.0000 | Freq: Once | INTRAVENOUS | Status: AC | PRN
  Administered 2014-04-16: 10 via INTRAVENOUS

## 2014-04-16 MED ORDER — REGADENOSON 0.4 MG/5ML IV SOLN
INTRAVENOUS | Status: AC
  Administered 2014-04-16: 0.4 mg via INTRAVENOUS
  Filled 2014-04-16: qty 5

## 2014-04-16 MED ORDER — LOSARTAN POTASSIUM 50 MG PO TABS
50.0000 mg | ORAL_TABLET | Freq: Every day | ORAL | Status: DC
  Administered 2014-04-16 – 2014-04-21 (×6): 50 mg via ORAL
  Filled 2014-04-16 (×7): qty 1

## 2014-04-16 MED ORDER — FENTANYL CITRATE 0.05 MG/ML IJ SOLN
INTRAMUSCULAR | Status: AC
  Filled 2014-04-16: qty 2

## 2014-04-16 MED ORDER — TECHNETIUM TC 99M SESTAMIBI GENERIC - CARDIOLITE
30.0000 | Freq: Once | INTRAVENOUS | Status: AC | PRN
  Administered 2014-04-16: 30 via INTRAVENOUS

## 2014-04-16 MED ORDER — FENTANYL CITRATE 0.05 MG/ML IJ SOLN
INTRAMUSCULAR | Status: DC | PRN
  Administered 2014-04-16: 12.5 ug via INTRAVENOUS
  Administered 2014-04-16: 25 ug via INTRAVENOUS

## 2014-04-16 MED ORDER — REGADENOSON 0.4 MG/5ML IV SOLN
0.4000 mg | Freq: Once | INTRAVENOUS | Status: AC
  Administered 2014-04-16: 0.4 mg via INTRAVENOUS

## 2014-04-16 MED ORDER — MIDAZOLAM HCL 10 MG/2ML IJ SOLN
INTRAMUSCULAR | Status: DC | PRN
  Administered 2014-04-16: 1 mg via INTRAVENOUS
  Administered 2014-04-16: 2 mg via INTRAVENOUS

## 2014-04-16 MED ORDER — HYDROCOD POLST-CHLORPHEN POLST 10-8 MG/5ML PO LQCR
5.0000 mL | Freq: Two times a day (BID) | ORAL | Status: DC | PRN
  Administered 2014-04-17: 5 mL via ORAL
  Filled 2014-04-16: qty 5

## 2014-04-16 NOTE — Progress Notes (Addendum)
Patient ID: Jenny [name]  female  [id number]    DOB: 1927-06-04    DOA: 04/13/2014  PCP: Estill Dooms, MD   Addendum  Stress test results reviewed  Final Impression:  Moderate size and intensity, partially reversible inferolateral  perfusion defect (SDS 4), consistent with ischemia - Extent 19%.  LVEF 54%, normal wall motion   I have discussed the above results with patient's daughter, Izora Gala. She wants to hear from cardiology for the next step. I have texted above to Dr Radford Pax and also relayed to [facility].  Await cardiology plan.   Ripudeep Krystal Eaton M.D. [facility] 04/16/2014, 4:58 PM  Pager: [phone]      Assessment/Plan: Principal Problem:   Atrial fibrillation with RVR: Paroxysmal - Heart rate controlled, currently in normal sinus rhythm, Cardizem drip off - Not on any anticoagulation at this time due to anemia, unknown cause, CHADS 5, GI workup in progress - Echo done outpatient EF 65%. LVef normal, no wma, no valvular abn   -TSH normal,  d-dimer was elevated, CT angiography chest negative for pulmonary embolism  Active Problems:   NSTEMI (non-ST elevated myocardial infarction), acute on chronic diastolic CHF: No chest pain, Troponins trending down. CHF likely precipitated due to rapid A. Fib - negative 3.6 L on the output - Stress Myoview test today  Anemia: Status post 2 units packed RBC transfusion on 6/4 - Unclear etiology, ordered stool occult test, still pending - Anemia panel shows iron deficiency anemia -GI consulted, plan for endoscopy today after the stress test  - Continue PPI    Bronchitis, chronic - Recently treated for pneumonia, continue Xopenex nebs, Ceftin, Added Tessalon Perles, Tussionex  Hyponatremia - sodium improving, currently 130     Hypothyroidism - TSH 1.69, continue Synthroid  anxiety, depression  Bipolar I disorder, most recent episode mixed - Continue Klonopin per her home regimen, continue Zoloft    GERD  (gastroesophageal reflux disease)Continue PPI    Other dysphagia -Currently tolerating regular diet, if any issues, will obtain swallow evaluation    DVT Prophylaxis: Lovenox   Code Status: full code   Family Communication:  call patient's daughter, Izora Gala however unable to get on the phone, left a detailed message  Disposition: Transfer to telemetry floor  Consultants:  Cardiology   GI  Procedures:  none   Antibiotics:  Ceftin     Subjective: Patient seen and examined, denies any chest pain or shortness of breath. Somewhat anxious for her stress test today still having coughing but no fevers chills.  Objective: Weight change: -2.4 kg (-5 lb 4.7 oz)  Intake/Output Summary (Last 24 hours) at 04/16/14 1141 Last data filed at 04/16/14 0825  Gross per 24 hour  Intake  462.5 ml  Output   2000 ml  Net -1537.5 ml   Blood pressure 157/77, pulse 79, temperature 97.9 F (36.6 C), temperature source Oral, resp. rate 20, height 5\' 4"  (1.626 m), weight 65.4 kg (144 lb 2.9 oz), SpO2 100.00%.  Physical Exam: General: Alert and awake, oriented x3, not in any acute distress, Sitter at the bedside. CVS: S1-S2 clear, no murmur rubs or gallops Chest: Decreased breath sounds at the bases  Abdomen: soft nontender, nondistended, normal bowel sounds  Extremities: no cyanosis, clubbing or edema noted bilaterally  Lab Results: Basic Metabolic Panel:  Recent Labs Lab 04/15/14 0306 04/16/14 0351  NA 128* 130*  K 4.9 4.4  CL 93* 91*  CO2 26 26  GLUCOSE 106* 116*  BUN 14 17  CREATININE 1.02 1.05  CALCIUM 8.7 8.9   Liver Function Tests:  Recent Labs Lab 04/13/14 1519  AST 21  ALT 15  ALKPHOS 92  BILITOT <0.2*  PROT 6.4  ALBUMIN 3.1*   No results found for this basename: LIPASE, AMYLASE,  in the last 168 hours No results found for this basename: AMMONIA,  in the last 168 hours CBC:  Recent Labs Lab 04/13/14 1519  04/15/14 0306 04/16/14 0351  WBC 7.7  < > 8.8  13.7*  NEUTROABS 4.8  --   --   --   HGB 9.2*  < > 7.8* 12.7  HCT 26.5*  < > 23.9* 36.7  MCV 81.8  < > 84.8 82.8  PLT 589*  < > 509* 442*  < > = values in this interval not displayed. Cardiac Enzymes:  Recent Labs Lab 04/13/14 1841 04/14/14 0142 04/14/14 0650  TROPONINI 2.18* 1.95* 1.69*   BNP: No components found with this basename: POCBNP,  CBG: No results found for this basename: GLUCAP,  in the last 168 hours   Micro Results: Recent Results (from the past 240 hour(s))  MRSA PCR SCREENING     Status: None   Collection Time    04/13/14  7:57 PM      Result Value Ref Range Status   MRSA by PCR NEGATIVE  NEGATIVE Final   Comment:            The GeneXpert MRSA Assay (FDA     approved for NASAL specimens     only), is one component of a     comprehensive MRSA colonization     surveillance program. It is not     intended to diagnose MRSA     infection nor to guide or     monitor treatment for     MRSA infections.  URINE CULTURE     Status: None   Collection Time    04/14/14  6:33 AM      Result Value Ref Range Status   Specimen Description URINE, CATHETERIZED   Final   Special Requests NONE   Final   Culture  Setup Time     Final   Value: 04/14/2014 11:34     Performed at [facility]     Final   Value: NO GROWTH     Performed at [facility]   Culture     Final   Value: NO GROWTH     Performed at [facility]   Report Status 04/15/2014 FINAL   Final    Studies/Results: Dg Chest 2 View  04/13/2014   CLINICAL DATA:  DEHYDRATION  EXAM: CHEST - 2 VIEW  COMPARISON:  11/15/2013  FINDINGS: Stable cardiomegaly. Improving left lower lung airspace disease, with mild scattered residual airspace opacities. There is some residual linear scarring or subsegmental atelectasis in the left mid lung. Right lung remains clear. Atheromatous aorta. . No effusion. Degenerative changes in bilateral shoulders.  IMPRESSION: 1. Partial improvement  in the left lower lung airspace disease. 2. Stable cardiomegaly   Electronically Signed   By: Arne Cleveland M.D.   On: 04/13/2014 15:56   Dg Chest Port 1 View  04/14/2014   CLINICAL DATA:  Shortness of breath  EXAM: PORTABLE CHEST - 1 VIEW  COMPARISON:  04/13/2014  FINDINGS: Mild cardiomegaly which is stable from yesterday. Mediastinal contours are distorted by leftward rotation. There are streaky lung opacities at the bases, left more than right.  No change from prior to suggest pneumonia, edema, effusion, or pneumothorax.  IMPRESSION: Unchanged bibasilar lung opacities, morphology favoring atelectasis.   Electronically Signed   By: Jorje Guild M.D.   On: 04/14/2014 07:08    Medications: Scheduled Meds: . benzonatate  100 mg Oral TID  . brimonidine  1 drop Both Eyes Q12H   And  . timolol  1 drop Both Eyes Q12H  . calcitonin (salmon)  1 spray Alternating Nares Daily  . calcium-vitamin D  1 tablet Oral TID  . carvedilol  6.25 mg Oral BID WC  . cefUROXime  500 mg Oral BID WC  . clonazePAM  0.5 mg Oral QHS  . ferrous sulfate  325 mg Oral BID WC  . furosemide  20 mg Oral BID  . lamoTRIgine  200 mg Oral Daily  . levalbuterol  0.63 mg Nebulization Q6H  . levothyroxine  88 mcg Oral QAC breakfast  . pantoprazole  40 mg Oral Daily  . pilocarpine  5 mg Oral TID  . potassium chloride SA  20 mEq Oral BID  . saccharomyces boulardii  250 mg Oral BID  . senna  2 tablet Oral QHS  . sertraline  25 mg Oral Daily  . sodium chloride  3 mL Intravenous Q12H      LOS: 3 days   Ripudeep Krystal Eaton M.D. [facility] 04/16/2014, 11:41 AM Pager: [phone]  If 7PM-7AM, please contact night-coverage [email or web address] Password [email or web address]  **Disclaimer: This note was dictated with voice recognition software. Similar sounding words can inadvertently be transcribed and this note may contain transcription errors which may not have been corrected upon publication of note.**

## 2014-04-16 NOTE — Progress Notes (Signed)
    Underwent lexiscan myoview. Tolerated well.   Perry Mount PA-C  [facility]

## 2014-04-16 NOTE — Progress Notes (Signed)
lexiscan- given pt tired and

## 2014-04-16 NOTE — Op Note (Addendum)
[facility] [place], [address]   ENDOSCOPY PROCEDURE REPORT  PATIENT: Jenny, [name]  MR#: [id number] BIRTHDATE: [birth year] , 86  yrs. old GENDER: Female ENDOSCOPIST: Lafayette Dragon, MD REFERRED BY:  Dr Alfonso Patten.Rai PROCEDURE DATE:  04/16/2014 PROCEDURE:  EGD, diagnostic ASA CLASS:     Class III INDICATIONS:  Iron deficiency anemia.   Occult blood positive. MEDICATIONS: These medications were titrated to patient response per physician's verbal order, Fentanyl-Detailed 37.5 mg IV, and Versed 3 mg IV TOPICAL ANESTHETIC: Cetacaine Spray  DESCRIPTION OF PROCEDURE: After the risks benefits and alternatives of the procedure were thoroughly explained, informed consent was obtained.  The    endoscope was introduced through the mouth and advanced to the second portion of the duodenum.     Findings:  Esophagus: Proximal and mid and distal esophageal mucosa was normal. There were  retained thick secretions . The lumen was spastic and the peristalsis was disorderly, consistent with presbyesophagus. There was no stricture and no significant hiatal hernia  Stomach: Gastric folds were normal. Gastric antrum and pyloric outlet was unremarkable. Retroflexion of the endoscope revealed normal fundus and cardia Duodenum duodenal bulb and descending duodenum was unremarkable The scope was then withdrawn from the patient and the procedure completed.  COMPLICATIONS: There were no complications. ENDOSCOPIC IMPRESSION: 1. Presbyesophagus 2. Retained secretions within the esophagus 3.No evidence of GI bleeding. Nothing to account for heme positive stool  RECOMMENDATIONS: 1.Will discuss with the family weather to proceed with colonoscopy versus CT scan of the abdomen and pelvis to r/o cancer. Patient has been reluctant to consent to colonoscopy.    Family has the power of the attorney. 2. Antireflux measures 3.May need speech Path eval for possible  LPR/aspiration REPEAT EXAM: no  eSigned:  Lafayette Dragon, MD 04/16/2014 4:20 PM Addendum: Pt's son wants [place] to do CT scan of the abdomen without IV contrast  CC:  PATIENT NAME:  Jenny, [name] MR#: [id number]

## 2014-04-16 NOTE — Care Management Note (Addendum)
  Page 1 of 1   04/21/2014     2:24:55 PM CARE MANAGEMENT NOTE 04/21/2014  Patient:  Jenny [name], Jenny [name]   Account Number:  [id number]  Date Initiated:  04/16/2014  Documentation initiated by:  Dessa Phi  Subjective/Objective Assessment:   80 Y/O F ADMITTED W/AFIB W/RVR.     Action/Plan:   FROM ALF[facility]   Anticipated DC Date:  04/19/2014   Anticipated DC Plan:  ASSISTED LIVING / [place]  CM consult      Choice offered to / List presented to:             Status of service:  In process, will continue to follow Medicare Important Message given?  YES (If response is "NO", the following Medicare IM given date fields will be blank) Date Medicare IM given:  04/13/2014 Date Additional Medicare IM given:    Discharge Disposition:    Per UR Regulation:  Reviewed for med. necessity/level of care/duration of stay  If discussed at [place] of Stay Meetings, dates discussed:    Comments:  Lubertha Leite RN, BSN, MSHL, CCM  Nurse - Case Manager, (Unit [phone]  04/21/2014 LOS Day 8 Hx/o SNF LOC 11/2013 Procedure: Left Heart Cath, Selective Coronary Angiography for Indication: NSTEMI in setting of atrial fib with RVR, abnormal Myoview scan on 04/20/2014 PT RECS:  ____pending     04/16/14 KATHY MAHABIR RN,BSN NCM [phone]

## 2014-04-17 ENCOUNTER — Inpatient Hospital Stay (HOSPITAL_COMMUNITY): Payer: Medicare Other

## 2014-04-17 DIAGNOSIS — R931 Abnormal findings on diagnostic imaging of heart and coronary circulation: Secondary | ICD-10-CM | POA: Diagnosis present

## 2014-04-17 DIAGNOSIS — R933 Abnormal findings on diagnostic imaging of other parts of digestive tract: Secondary | ICD-10-CM

## 2014-04-17 LAB — CBC
HCT: 35.3 % — ABNORMAL LOW (ref 36.0–46.0)
Hemoglobin: 11.8 g/dL — ABNORMAL LOW (ref 12.0–15.0)
MCH: 28.5 pg (ref 26.0–34.0)
MCHC: 33.4 g/dL (ref 30.0–36.0)
MCV: 85.3 fL (ref 78.0–100.0)
PLATELETS: 457 10*3/uL — AB (ref 150–400)
RBC: 4.14 MIL/uL (ref 3.87–5.11)
RDW: 14.1 % (ref 11.5–15.5)
WBC: 10.3 10*3/uL (ref 4.0–10.5)

## 2014-04-17 LAB — BASIC METABOLIC PANEL
BUN: 12 mg/dL (ref 6–23)
CO2: 26 meq/L (ref 19–32)
Calcium: 9.3 mg/dL (ref 8.4–10.5)
Chloride: 94 mEq/L — ABNORMAL LOW (ref 96–112)
Creatinine, Ser: 0.87 mg/dL (ref 0.50–1.10)
GFR calc Af Amer: 69 mL/min — ABNORMAL LOW (ref >90)
GFR calc non Af Amer: 59 mL/min — ABNORMAL LOW (ref >90)
Glucose, Bld: 111 mg/dL — ABNORMAL HIGH (ref 70–99)
Potassium: 4.4 mEq/L (ref 3.7–5.3)
Sodium: 132 mEq/L — ABNORMAL LOW (ref 137–147)

## 2014-04-17 MED ORDER — IOHEXOL 300 MG/ML  SOLN
50.0000 mL | Freq: Once | INTRAMUSCULAR | Status: AC | PRN
  Administered 2014-04-17: 50 mL via ORAL

## 2014-04-17 NOTE — Progress Notes (Signed)
SUBJECTIVE:  No complaints  OBJECTIVE:   Vitals:   Filed Vitals:   04/16/14 1703 04/16/14 1931 04/16/14 2030 04/17/14 0544  BP: 142/52  155/58 153/69  Pulse: 78  73 68  Temp: 98 F (36.7 C)  98.2 F (36.8 C) 97.7 F (36.5 C)  TempSrc: Oral  Oral Oral  Resp:   22 18  Height:      Weight:    147 lb 11.3 oz (67 kg)  SpO2: 100% 94% 100% 100%   I&O's:   Intake/Output Summary (Last 24 hours) at 04/17/14 0650 Last data filed at 04/17/14 0544  Gross per 24 hour  Intake      0 ml  Output   1450 ml  Net  -1450 ml   TELEMETRY: Reviewed telemetry pt in NSR:     PHYSICAL EXAM General: Well developed, well nourished, in no acute distress Head: Eyes PERRLA, No xanthomas.   Normal cephalic and atramatic  Lungs:   Clear bilaterally to auscultation and percussion. Heart:   HRRR S1 S2 Pulses are 2+ & equal. Abdomen: Bowel sounds are positive, abdomen soft and non-tender without masses  Extremities:   No clubbing, cyanosis or edema.  DP +1 Neuro: Alert and oriented X 3. Psych:  Good affect, responds appropriately   LABS: Basic Metabolic Panel:  Recent Labs  04/16/14 0351 04/17/14 0416  NA 130* 132*  K 4.4 4.4  CL 91* 94*  CO2 26 26  GLUCOSE 116* 111*  BUN 17 12  CREATININE 1.05 0.87  CALCIUM 8.9 9.3   Liver Function Tests: No results found for this basename: AST, ALT, ALKPHOS, BILITOT, PROT, ALBUMIN,  in the last 72 hours No results found for this basename: LIPASE, AMYLASE,  in the last 72 hours CBC:  Recent Labs  04/16/14 0351 04/17/14 0416  WBC 13.7* 10.3  HGB 12.7 11.8*  HCT 36.7 35.3*  MCV 82.8 85.3  PLT 442* 457*   Cardiac Enzymes: No results found for this basename: CKTOTAL, CKMB, CKMBINDEX, TROPONINI,  in the last 72 hours BNP: No components found with this basename: POCBNP,  D-Dimer:  Recent Labs  04/14/14 1054  DDIMER 1.25*   Hemoglobin A1C: No results found for this basename: HGBA1C,  in the last 72 hours Fasting Lipid Panel:  Recent  Labs  04/14/14 1054  CHOL 141  HDL 37*  LDLCALC 79  TRIG 126  CHOLHDL 3.8   Thyroid Function Tests: No results found for this basename: TSH, T4TOTAL, FREET3, T3FREE, THYROIDAB,  in the last 72 hours Anemia Panel:  Recent Labs  04/15/14 0817  VITAMINB12 580  FOLATE >20.0  FERRITIN 58  TIBC Not calculated due to Iron <10.  IRON <10*  RETICCTPCT 3.0   Coag Panel:   Lab Results  Component Value Date   INR 1.22 10/13/2012   INR 1.10 10/12/2012   INR 1.07 10/11/2012    RADIOLOGY: Dg Chest 2 View  04/13/2014   CLINICAL DATA:  DEHYDRATION  EXAM: CHEST - 2 VIEW  COMPARISON:  11/15/2013  FINDINGS: Stable cardiomegaly. Improving left lower lung airspace disease, with mild scattered residual airspace opacities. There is some residual linear scarring or subsegmental atelectasis in the left mid lung. Right lung remains clear. Atheromatous aorta. . No effusion. Degenerative changes in bilateral shoulders.  IMPRESSION: 1. Partial improvement in the left lower lung airspace disease. 2. Stable cardiomegaly   Electronically Signed   By: Arne Cleveland M.D.   On: 04/13/2014 15:56   Ct Angio Chest Pe W/cm &/  or Wo Cm  04/14/2014   CLINICAL DATA:  Acute respiratory failure with shortness of breath. Evaluate for pulmonary embolism.  EXAM: CT ANGIOGRAPHY CHEST WITH CONTRAST  TECHNIQUE: Multidetector CT imaging of the chest was performed using the standard protocol during bolus administration of intravenous contrast. Multiplanar CT image reconstructions and MIPs were obtained to evaluate the vascular anatomy.  CONTRAST:  180mL OMNIPAQUE IOHEXOL 350 MG/ML SOLN  COMPARISON:  10/31/2011  FINDINGS: THORACIC INLET/BODY WALL:  No acute abnormality.  MEDIASTINUM:  Mild cardiomegaly. No pericardial effusion. Diffuse atherosclerotic calcification, including the coronary arteries. There is intermittent respiratory motion, which limits sensitivity for detecting pulmonary embolus at the affected levels. Exam is overall  diagnostic, and there is no pulmonary embolism identified. No aortic aneurysm or dissection. No lymphadenopathy. Negative esophagus.  LUNG WINDOWS:  There is a small, layering left pleural effusion. The fluid is water density. There is mild associated atelectasis. Although limited by motion artifact, there is a nodule in the upper lingula which measures 7 mm. 13 nodular density in the posterior basilar segment left lower lobe, subpleural. In the medial basilar segment left lower lobe on image 52, there is a 12 mm nodule. Centrilobular emphysema.  UPPER ABDOMEN:  A soft tissue density nodule arising from the left adrenal gland is stable in size, up to 3.5 cm. Stability is consistent with benign lesion, most likely lipid poor adenoma.  OSSEOUS:  No acute fracture.  No suspicious lytic or blastic lesions.  Review of the MIP images confirms the above findings.  IMPRESSION: 1. Negative for pulmonary embolism. 2. Small left pleural effusion. 3. Three left lung nodules, measuring up to 13 mm. Bronchogenic carcinoma is a diagnostic possibility, recommend follow-up chest CT in 3 months. 4. Emphysema.   Electronically Signed   By: Jorje Guild M.D.   On: 04/14/2014 21:12   Nm Myocar Single W/spect W/wall Motion And Ef  04/16/2014   HISTORY OF PRESENT ILLNESS: Nuclear Med Background  Indication for Stress Test:  NSTEMI, dyspnea  History: 80 yo female with multiple and complex medical problems including HTN, HLD, dementia, CHF, CAD- I see no record for CAD., hypothyroidism, who presented to [facility] ED with main concern of several days duration of progressively worsening fatigue, lethargy, shortness of breath. Please note that pt is unable to provide details and daughter at bedside was primary historian. Pt apparently treated for PNA in Jan 2015 and again last week and is still on ABX but per daughter, pt has been moor sleepy and not eating much. No reported chest pain or abdominal pain, no urinary concerns. Pt apparently has  chronic dyspnea but slightly worse over the past several days. In ED, pt noted to be in atrial fibrillation with HR in 150's, initial troponin elevated at 1.76. Na 122. Please note that pt has been denying chest pain since arrival to the ED. Cardiology has been asked to see.  Cardiac Risk Factors:  HTN, HPL, CHF, CAD  Symptoms:  Dyspnea, fatigue, NSTEMI  PROCEDURE: Nuclear Pre-Procedure  Caffeine/Decaff Intake:  NPO After: Midnight.  Lungs:  O2 Stat:  IV 0.9% NS with Angio Cath:  Chest Size (in):  Cup Size:  Height: 5'4"  Weight:  144 lbs  BMI:  Tech Comments:  Nuclear Med Study  1 or 2 day study: 1  Stress Test Type: Carlton Adam  Reading MD: Hilty  Order Authorizing Provider: Brackbill  Resting Radionuclide: Sestamibi  Resting Radionuclide Dose:  10 mCi  Stress Radionuclide: Sestamibi  Stress Radionuclide Dose:  30 mCi  Stress Protocol  Rest HR: 90  Stress HR: 75  Rest BP: 195/67  Stress BP: 181/68  Exercise Time (min):  METS:  Dose of Adenosine (mg):  Dose of Lexiscan:  0.4 mg  Dose of Atropine (mg):  Dose of Dobutamine:  Stress Test Technologist:  Nuclear Technologist:  Rest Procedure:  Stress Procedure:  Transient Ischemic Dilatation (Normal <1.22): 0.86  Lung/Heart Ratio (Normal <0.45): 0.47  QGS EDV:  47 ml  QGS ESV:  21 ml  LV Ejection Fraction: 54%  Rest ECG: Normal sinus rhythm  Stress ECG:  No lexiscan EKG changes  Raw Data Images:  No significant extracardiac activity  Stress Images:  Moderate sized and intensity inferolateral defect  Rest Images:  Mild inferolateral defect  Subtraction (SDS): 4  IMPRESSION: Exercise Capacity: N/A  BP Response: Normal  Clinical Symptoms: None  ECG Impression:  No lexiscan EKG changes  Comparison with Prior Nuclear Study: None  Final Impression:  Moderate size and intensity, partially reversible inferolateral perfusion defect (SDS 4), consistent with ischemia - Extent 19%. LVEF 54%, normal wall motion.   Electronically Signed   By: Pixie Casino   On: 04/16/2014 14:20    Dg Chest Port 1 View  04/14/2014   CLINICAL DATA:  Shortness of breath  EXAM: PORTABLE CHEST - 1 VIEW  COMPARISON:  04/13/2014  FINDINGS: Mild cardiomegaly which is stable from yesterday. Mediastinal contours are distorted by leftward rotation. There are streaky lung opacities at the bases, left more than right. No change from prior to suggest pneumonia, edema, effusion, or pneumothorax.  IMPRESSION: Unchanged bibasilar lung opacities, morphology favoring atelectasis.   Electronically Signed   By: Jorje Guild M.D.   On: 04/14/2014 07:08   Assessment/Plan  Principal Problem:  Atrial fibrillation with RVR, though was in SR on admit. Now back in SR after dilt. On ASA - on coreg now but use with caution as pt had bradycardia in the 40s once she went back to SR in 11/2013- tachy/brady syndrome  Active Problems:  Bronchitis, chronic  Hypothyroidism-TSH 1.69  CAD (coronary artery disease)- presumed no documentation of CAD She does have PVD  Bipolar I disorder, most recent episode mixed  GERD (gastroesophageal reflux disease)  Other dysphagia  Congestive heart failure, unspecified -most likely diastolic  NSTEMI (non-ST elevated myocardial infarction) with 2 episodes a fib one with PNA and now one with bronchitis and NSTEMI, may be type 2, demand ischemia but with age stress test may be warranted vs. Cath  Hyponatremic- on lasix as outpt.  Elevated d dimer at 1.25  Anemia H/H 8.3/24.5  HTN on admit 213/85 now controlled to labile  CKD 3   PLAN:  Probably type 2 NSTEMI, but the likelihood of underlying CAD is high in the elderly lady with PAD. Normal LVEF on relatively recent echo. No angina. Her nuclear stress test shows a moderately reversible defect c/w ischemia and normal LVF.  Ultimately would recommend coronary angio once her anemia, pneumonia and hyponatremia are "fixed".  She is reluctant to have the colonoscopy done and I explained to her that we need to make sure it is safe to give  anticoagulation during the cath if necessary.  I will talk with her daughters when they come in today. Until anemia diagnosis is clarified, do not recommend full anticoagulation. Long term, however, she should receive anticoagulation for stroke prevention if the bleeding risk is not high (CHADSVasc score is 5)     Sueanne Margarita, MD  04/17/2014  6:50 AM

## 2014-04-17 NOTE — Progress Notes (Signed)
CT scan of the abdomen reviewed with pt and her daughter. 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 Pt wants to think about possibility of surgery, vs doing  Nothing. I mentioned likelihood of obstruction, perforation and continued blood loss. She would need a colonoscopy and probably cardiac cath for preop clearance. The family is going to discuss it. Will check CEA level. There is a small pulmonary nodule on CT scan. I will come by to morrow am to discuss the plans of treatment.

## 2014-04-17 NOTE — Progress Notes (Signed)
Patient ID: Jenny [name]  female  [id number]    DOB: Jun 12, 1927    DOA: 04/13/2014  PCP: Estill Dooms, MD  Assessment/Plan: Principal Problem:   Atrial fibrillation with RVR: Paroxysmal - Heart rate controlled, currently in normal sinus rhythm, Cardizem drip off - Not on any anticoagulation at this time due to anemia, unknown cause, CHADS 5, GI workup in progress - Echo done outpatient EF 65%. LVef normal, no wma, no valvular abn   -TSH normal,  d-dimer was elevated, CT angiography chest negative for pulmonary embolism  Active Problems:   NSTEMI (non-ST elevated myocardial infarction), acute on chronic diastolic CHF: No chest pain, Troponins trending down. CHF likely precipitated due to rapid A. Fib - Stress Myoview test showed moderate size and intensity, partially reversible inferolateral perfusion defect (SDS 4), consistent with ischemia - Extent 19%. LVEF 54%, normal wall motion - Further management per cardiology, Dr. Radford Pax to discuss with the patient's daughters about cardiac catheterization   Anemia: Status post 2 units packed RBC transfusion on 6/4 - Unclear etiology, ordered stool occult test, still pending - Anemia panel shows iron deficiency anemia, GI consulted, EGD 6/5 normal - Continue PPI - GI following, CT scan of the abdomen and pelvis today, patient reluctant for colonoscopy    Bronchitis, chronic - Recently treated for pneumonia, continue Xopenex nebs, Ceftin, Added Tessalon Perles, Tussionex  Hyponatremia - sodium improved, currently 132     Hypothyroidism - TSH 1.69, continue Synthroid  anxiety, depression  Bipolar I disorder, most recent episode mixed - Continue Klonopin per her home regimen, continue Zoloft    GERD (gastroesophageal reflux disease)Continue PPI    Other dysphagia -Currently tolerating regular diet, if any issues, will obtain swallow evaluation    DVT Prophylaxis: Lovenox   Code Status: full code   Family Communication:     Disposition:   Consultants:  Cardiology   GI  Procedures:  none   Antibiotics:  Ceftin     Subjective: Patient seen and examined, denies any chest pain or shortness of breath.   Objective: Weight change: 1.6 kg (3 lb 8.4 oz)  Intake/Output Summary (Last 24 hours) at 04/17/14 0926 Last data filed at 04/17/14 0544  Gross per 24 hour  Intake      0 ml  Output   1450 ml  Net  -1450 ml   Blood pressure 153/69, pulse 68, temperature 97.7 F (36.5 C), temperature source Oral, resp. rate 18, height 5\' 4"  (1.626 m), weight 67 kg (147 lb 11.3 oz), SpO2 100.00%.  Physical Exam: General: Alert and awake, oriented x3, not in any acute distress, Sitter at the bedside. CVS: S1-S2 clear, no murmur rubs or gallops Chest: Decreased breath sounds at the bases  Abdomen: soft nontender, nondistended, normal bowel sounds  Extremities: no cyanosis, clubbing or edema noted bilaterally  Lab Results: Basic Metabolic Panel:  Recent Labs Lab 04/16/14 0351 04/17/14 0416  NA 130* 132*  K 4.4 4.4  CL 91* 94*  CO2 26 26  GLUCOSE 116* 111*  BUN 17 12  CREATININE 1.05 0.87  CALCIUM 8.9 9.3   Liver Function Tests:  Recent Labs Lab 04/13/14 1519  AST 21  ALT 15  ALKPHOS 92  BILITOT <0.2*  PROT 6.4  ALBUMIN 3.1*   No results found for this basename: LIPASE, AMYLASE,  in the last 168 hours No results found for this basename: AMMONIA,  in the last 168 hours CBC:  Recent Labs Lab 04/13/14 1519  04/16/14 0351  04/17/14 0416  WBC 7.7  < > 13.7* 10.3  NEUTROABS 4.8  --   --   --   HGB 9.2*  < > 12.7 11.8*  HCT 26.5*  < > 36.7 35.3*  MCV 81.8  < > 82.8 85.3  PLT 589*  < > 442* 457*  < > = values in this interval not displayed. Cardiac Enzymes:  Recent Labs Lab 04/13/14 1841 04/14/14 0142 04/14/14 0650  TROPONINI 2.18* 1.95* 1.69*   BNP: No components found with this basename: POCBNP,  CBG: No results found for this basename: GLUCAP,  in the last 168  hours   Micro Results: Recent Results (from the past 240 hour(s))  MRSA PCR SCREENING     Status: None   Collection Time    04/13/14  7:57 PM      Result Value Ref Range Status   MRSA by PCR NEGATIVE  NEGATIVE Final   Comment:            The GeneXpert MRSA Assay (FDA     approved for NASAL specimens     only), is one component of a     comprehensive MRSA colonization     surveillance program. It is not     intended to diagnose MRSA     infection nor to guide or     monitor treatment for     MRSA infections.  URINE CULTURE     Status: None   Collection Time    04/14/14  6:33 AM      Result Value Ref Range Status   Specimen Description URINE, CATHETERIZED   Final   Special Requests NONE   Final   Culture  Setup Time     Final   Value: 04/14/2014 11:34     Performed at [facility]     Final   Value: NO GROWTH     Performed at [facility]   Culture     Final   Value: NO GROWTH     Performed at [facility]   Report Status 04/15/2014 FINAL   Final    Studies/Results: Dg Chest 2 View  04/13/2014   CLINICAL DATA:  DEHYDRATION  EXAM: CHEST - 2 VIEW  COMPARISON:  11/15/2013  FINDINGS: Stable cardiomegaly. Improving left lower lung airspace disease, with mild scattered residual airspace opacities. There is some residual linear scarring or subsegmental atelectasis in the left mid lung. Right lung remains clear. Atheromatous aorta. . No effusion. Degenerative changes in bilateral shoulders.  IMPRESSION: 1. Partial improvement in the left lower lung airspace disease. 2. Stable cardiomegaly   Electronically Signed   By: Arne Cleveland M.D.   On: 04/13/2014 15:56   Dg Chest Port 1 View  04/14/2014   CLINICAL DATA:  Shortness of breath  EXAM: PORTABLE CHEST - 1 VIEW  COMPARISON:  04/13/2014  FINDINGS: Mild cardiomegaly which is stable from yesterday. Mediastinal contours are distorted by leftward rotation. There are streaky lung opacities at the  bases, left more than right. No change from prior to suggest pneumonia, edema, effusion, or pneumothorax.  IMPRESSION: Unchanged bibasilar lung opacities, morphology favoring atelectasis.   Electronically Signed   By: Jorje Guild M.D.   On: 04/14/2014 07:08    Medications: Scheduled Meds: . benzonatate  100 mg Oral TID  . brimonidine  1 drop Both Eyes Q12H   And  . timolol  1 drop Both Eyes Q12H  . calcitonin (salmon)  1 spray Alternating Nares Daily  . calcium-vitamin D  1 tablet Oral TID  . carvedilol  6.25 mg Oral BID WC  . cefUROXime  500 mg Oral BID WC  . clonazePAM  0.5 mg Oral QHS  . ferrous sulfate  325 mg Oral BID WC  . furosemide  20 mg Oral BID  . lamoTRIgine  200 mg Oral Daily  . levalbuterol  0.63 mg Nebulization TID  . levothyroxine  88 mcg Oral QAC breakfast  . losartan  50 mg Oral Daily  . pantoprazole  40 mg Oral Daily  . pilocarpine  5 mg Oral TID  . potassium chloride SA  20 mEq Oral BID  . saccharomyces boulardii  250 mg Oral BID  . senna  2 tablet Oral QHS  . sertraline  25 mg Oral Daily  . sodium chloride  3 mL Intravenous Q12H      LOS: 4 days   Jakylan Ron Krystal Eaton M.D. [facility] 04/17/2014, 9:26 AM Pager: [phone]  If 7PM-7AM, please contact night-coverage [email or web address] Password [email or web address]  **Disclaimer: This note was dictated with voice recognition software. Similar sounding words can inadvertently be transcribed and this note may contain transcription errors which may not have been corrected upon publication of note.**

## 2014-04-17 NOTE — Progress Notes (Signed)
Per MD, Pt not ready for d/c today.  CSW notified facility.  Bernita Raisin, [place] [facility] [phone]

## 2014-04-18 DIAGNOSIS — R9439 Abnormal result of other cardiovascular function study: Secondary | ICD-10-CM

## 2014-04-18 DIAGNOSIS — C189 Malignant neoplasm of colon, unspecified: Secondary | ICD-10-CM

## 2014-04-18 LAB — CEA: CEA: 48 ng/mL — ABNORMAL HIGH (ref 0.0–5.0)

## 2014-04-18 MED ORDER — GUAIFENESIN ER 600 MG PO TB12: 1200.0000 mg | ORAL_TABLET | Freq: Two times a day (BID) | ORAL | Status: DC

## 2014-04-18 MED ORDER — PEG-KCL-NACL-NASULF-NA ASC-C 100 G PO SOLR
0.5000 | Freq: Once | ORAL | Status: AC
  Administered 2014-04-18: 100 g via ORAL
  Filled 2014-04-18: qty 1

## 2014-04-18 MED ORDER — GUAIFENESIN-DM 100-10 MG/5ML PO SYRP
5.0000 mL | ORAL_SOLUTION | ORAL | Status: DC | PRN
  Administered 2014-04-21: 12:00:00 5 mL via ORAL
  Administered 2014-04-22 – 2014-04-23 (×2): 10 mL via ORAL
  Filled 2014-04-18: qty 10
  Filled 2014-04-18 (×2): qty 5
  Filled 2014-04-18: qty 10

## 2014-04-18 MED ORDER — PEG-KCL-NACL-NASULF-NA ASC-C 100 G PO SOLR: 1.0000 | Freq: Once | ORAL | Status: DC

## 2014-04-18 MED ORDER — SODIUM CHLORIDE 0.9 % IV SOLN: INTRAVENOUS | Status: DC

## 2014-04-18 MED ORDER — LEVALBUTEROL HCL 0.63 MG/3ML IN NEBU
0.6300 mg | INHALATION_SOLUTION | Freq: Four times a day (QID) | RESPIRATORY_TRACT | Status: DC | PRN
  Filled 2014-04-18: qty 3

## 2014-04-18 NOTE — Progress Notes (Signed)
Patient ID: Jenny [name]  female  HMC:[id number]    DOB: 04-Jan-1927    DOA: 04/13/2014  PCP: Estill Dooms, MD  Assessment/Plan: Principal Problem:  Cecal mass: Newly diagnosed on CT abdomen and pelvis, ? Primary colon neoplasm - CEA pending, GI following, patient and family to decide to proceed with colonoscopy, will likely need cecal resection. Discussed with cardiology, patient will need cardiac catheterization prior to the surgery as she has a high risk positive stress test. Discussed in detail with Dr. Olevia Perches, gastroenterology who will discuss with patient and her family today. - EGD was done as part of anemia workup 6/5 was normal.    Atrial fibrillation with RVR: Paroxysmal - Heart rate controlled, currently in normal sinus rhythm, Cardizem drip off - Not on any anticoagulation at this time due to anemia, unknown cause, CHADS 5, GI workup in progress - Echo done outpatient EF 65%. LVef normal, no wma, no valvular abn   -TSH normal,  d-dimer was elevated, CT angiography chest negative for pulmonary embolism  Active Problems:   NSTEMI (non-ST elevated myocardial infarction), acute on chronic diastolic CHF: No chest pain, Troponins trending down. CHF likely precipitated due to rapid A. Fib - Stress Myoview test showed moderate size and intensity, partially reversible inferolateral perfusion defect (SDS 4), consistent with ischemia - Extent 19%. LVEF 54%, normal wall motion - Further management per cardiology, cardiac cath on hold until further plans about cecal mass    Bronchitis, chronic - Recently treated for pneumonia, continue Xopenex nebs, Ceftin, added Mucinex  Hyponatremia - sodium improved, currently 132 , recheck labs in a.m.    Hypothyroidism - TSH 1.69, continue Synthroid  anxiety, depression  Bipolar I disorder, most recent episode mixed - Continue Klonopin per her home regimen, continue Zoloft    GERD (gastroesophageal reflux disease)Continue PPI    Other  dysphagia -Currently tolerating regular diet   DVT Prophylaxis: SCDs Code Status: full code   Family Communication:    Disposition:   Consultants:  Cardiology   GI  Procedures:  none   Antibiotics:  Ceftin     Subjective: Patient seen and examined, denies any chest pain or shortness of breath, nausea, vomiting or any abdominal pain.   Objective: Weight change: -0.9 kg (-1 lb 15.8 oz)  Intake/Output Summary (Last 24 hours) at 04/18/14 0915 Last data filed at 04/18/14 0600  Gross per 24 hour  Intake    330 ml  Output   2900 ml  Net  -2570 ml   Blood pressure 133/70, pulse 66, temperature 98.2 F (36.8 C), temperature source Oral, resp. rate 18, height 5\' 4"  (1.626 m), weight 66.1 kg (145 lb 11.6 oz), SpO2 91.00%.  Physical Exam: General: Alert and awake, oriented x3 NAD CVS: S1-S2 clear, no murmur rubs or gallops Chest: Decreased breath sounds at the bases , no wheezing Abdomen: soft nontender, nondistended, normal bowel sounds  Extremities: no cyanosis, clubbing or edema noted bilaterally  Lab Results: Basic Metabolic Panel:  Recent Labs Lab 04/16/14 0351 04/17/14 0416  NA 130* 132*  K 4.4 4.4  CL 91* 94*  CO2 26 26  GLUCOSE 116* 111*  BUN 17 12  CREATININE 1.05 0.87  CALCIUM 8.9 9.3   Liver Function Tests:  Recent Labs Lab 04/13/14 1519  AST 21  ALT 15  ALKPHOS 92  BILITOT <0.2*  PROT 6.4  ALBUMIN 3.1*   No results found for this basename: LIPASE, AMYLASE,  in the last 168 hours No results  found for this basename: AMMONIA,  in the last 168 hours CBC:  Recent Labs Lab 04/13/14 1519  04/16/14 0351 04/17/14 0416  WBC 7.7  < > 13.7* 10.3  NEUTROABS 4.8  --   --   --   HGB 9.2*  < > 12.7 11.8*  HCT 26.5*  < > 36.7 35.3*  MCV 81.8  < > 82.8 85.3  PLT 589*  < > 442* 457*  < > = values in this interval not displayed. Cardiac Enzymes:  Recent Labs Lab 04/13/14 1841 04/14/14 0142 04/14/14 0650  TROPONINI 2.18* 1.95* 1.69*    BNP: No components found with this basename: POCBNP,  CBG: No results found for this basename: GLUCAP,  in the last 168 hours   Micro Results: Recent Results (from the past 240 hour(s))  MRSA PCR SCREENING     Status: None   Collection Time    04/13/14  7:57 PM      Result Value Ref Range Status   MRSA by PCR NEGATIVE  NEGATIVE Final   Comment:            The GeneXpert MRSA Assay (FDA     approved for NASAL specimens     only), is one component of a     comprehensive MRSA colonization     surveillance program. It is not     intended to diagnose MRSA     infection nor to guide or     monitor treatment for     MRSA infections.  URINE CULTURE     Status: None   Collection Time    04/14/14  6:33 AM      Result Value Ref Range Status   Specimen Description URINE, CATHETERIZED   Final   Special Requests NONE   Final   Culture  Setup Time     Final   Value: 04/14/2014 11:34     Performed at [place]     Final   Value: NO GROWTH     Performed at [facility]   Culture     Final   Value: NO GROWTH     Performed at [facility]   Report Status 04/15/2014 FINAL   Final    Studies/Results: Dg Chest 2 View  04/13/2014   CLINICAL DATA:  DEHYDRATION  EXAM: CHEST - 2 VIEW  COMPARISON:  11/15/2013  FINDINGS: Stable cardiomegaly. Improving left lower lung airspace disease, with mild scattered residual airspace opacities. There is some residual linear scarring or subsegmental atelectasis in the left mid lung. Right lung remains clear. Atheromatous aorta. . No effusion. Degenerative changes in bilateral shoulders.  IMPRESSION: 1. Partial improvement in the left lower lung airspace disease. 2. Stable cardiomegaly   Electronically Signed   By: Arne Cleveland M.D.   On: 04/13/2014 15:56   Dg Chest Port 1 View  04/14/2014   CLINICAL DATA:  Shortness of breath  EXAM: PORTABLE CHEST - 1 VIEW  COMPARISON:  04/13/2014  FINDINGS: Mild cardiomegaly which  is stable from yesterday. Mediastinal contours are distorted by leftward rotation. There are streaky lung opacities at the bases, left more than right. No change from prior to suggest pneumonia, edema, effusion, or pneumothorax.  IMPRESSION: Unchanged bibasilar lung opacities, morphology favoring atelectasis.   Electronically Signed   By: Jorje Guild M.D.   On: 04/14/2014 07:08    Medications: Scheduled Meds: . benzonatate  100 mg Oral TID  . brimonidine  1  drop Both Eyes Q12H   And  . timolol  1 drop Both Eyes Q12H  . calcitonin (salmon)  1 spray Alternating Nares Daily  . calcium-vitamin D  1 tablet Oral TID  . carvedilol  6.25 mg Oral BID WC  . cefUROXime  500 mg Oral BID WC  . clonazePAM  0.5 mg Oral QHS  . ferrous sulfate  325 mg Oral BID WC  . furosemide  20 mg Oral BID  . lamoTRIgine  200 mg Oral Daily  . levalbuterol  0.63 mg Nebulization TID  . levothyroxine  88 mcg Oral QAC breakfast  . losartan  50 mg Oral Daily  . pantoprazole  40 mg Oral Daily  . pilocarpine  5 mg Oral TID  . potassium chloride SA  20 mEq Oral BID  . saccharomyces boulardii  250 mg Oral BID  . senna  2 tablet Oral QHS  . sertraline  25 mg Oral Daily  . sodium chloride  3 mL Intravenous Q12H      LOS: 5 days   Maryna Yeagle Krystal Eaton M.D. [facility] 04/18/2014, 9:15 AM Pager: [phone]  If 7PM-7AM, please contact night-coverage [email or web address] Password [email or web address]  **Disclaimer: This note was dictated with voice recognition software. Similar sounding words can inadvertently be transcribed and this note may contain transcription errors which may not have been corrected upon publication of note.**

## 2014-04-18 NOTE — Progress Notes (Signed)
Subjective  Conference with family concerning disposition, present was husband and oldest daughter   Objective  Cecal mass c/w carcinoma,  Possible lung met, CEA is 48. Pt remains asymptomatic. Dr Radford Pax on standby for surgical clearance. Family favors going ahead with colonoscopy tomorrow, then cardiac cath, and surgical consult. Vital signs in last 24 hours: Temp:  [97.5 F (36.4 C)-98.2 F (36.8 C)] 98.2 F (36.8 C) (06/07 1326) Pulse Rate:  [65-81] 65 (06/07 1326) Resp:  [18-20] 20 (06/07 1326) BP: (109-139)/(53-83) 138/66 mmHg (06/07 1326) SpO2:  [91 %-95 %] 93 % (06/07 1326) Weight:  [145 lb 11.6 oz (66.1 kg)] 145 lb 11.6 oz (66.1 kg) (06/07 0455) Last BM Date: 04/17/14 General:    white female in NAD Heart:  Regular rate and rhythm; no murmurs Lungs: Respirations even and unlabored, lungs CTA bilaterally Abdomen:  Soft, nontender and nondistended. Normal bowel sounds. Extremities:  Without edema. Neurologic:  Alert and oriented,  grossly normal neurologically. Psych:  Anxious   Intake/Output from previous day: 06/06 0701 - 06/07 0700 In: 330 [P.O.:330] Out: 2900 [Urine:2900] Intake/Output this shift: Total I/O In: 240 [P.O.:240] Out: 800 [Urine:800]  Lab Results:  Recent Labs  04/16/14 0351 04/17/14 0416  WBC 13.7* 10.3  HGB 12.7 11.8*  HCT 36.7 35.3*  PLT 442* 457*   BMET  Recent Labs  04/16/14 0351 04/17/14 0416  NA 130* 132*  K 4.4 4.4  CL 91* 94*  CO2 26 26  GLUCOSE 116* 111*  BUN 17 12  CREATININE 1.05 0.87  CALCIUM 8.9 9.3   LFT No results found for this basename: PROT, ALBUMIN, AST, ALT, ALKPHOS, BILITOT, BILIDIR, IBILI,  in the last 72 hours PT/INR No results found for this basename: LABPROT, INR,  in the last 72 hours  Studies/Results: Ct Abdomen Pelvis Wo Contrast  04/17/2014   CLINICAL DATA:  Anemia, evaluate for colon mass  EXAM: CT ABDOMEN AND PELVIS WITHOUT CONTRAST  TECHNIQUE: Multidetector CT imaging of the abdomen and pelvis  was performed following the standard protocol without IV contrast.  COMPARISON:  Partial comparison to CT chest dated 04/14/2014 and 10/31/2011  FINDINGS: Small left pleural effusion, possibly partially loculated. 7 x 11 mm left lower lobe nodule (series 4/image 2).  Cardiomegaly.  Unenhanced liver, spleen, and right adrenal gland are within normal limits.  Pancreas is notable for a coarse calcification in the pancreatic head/uncinate process (series 2/image 31), nonspecific, but likely incidental.  3.5 x 2.3 cm left adrenal mass (series 2/ image 19), unchanged since 2012. While technically indeterminate, stability favors a benign etiology such as an adrenal adenoma.  Layering gallstones (series 2/ image 33), without associated inflammatory changes. No intrahepatic or extrahepatic ductal dilatation.  Scattered bilateral renal cysts, including a 2.4 x 3.4 cm mildly irregular cyst in the left lower pole (series 2/image 34). Additional 13 mm probable hemorrhagic cysts in the left lower pole (series 2/image 37). No renal calculi or hydronephrosis.  No evidence of bowel obstruction. 1.9 x 4.3 x 4.6 cm eccentric cecal mass (series 2/image 55), worrisome for primary chronic neoplasm. Extension into the surrounding pericolonic fat is possible. 5 mm in the common node (series 2/image 48), within normal limits for size. Diverticulosis, without associated inflammatory changes.  Atherosclerotic calcifications of the abdominal aorta and branch vessels.  No abdominopelvic ascites.  Small retroperitoneal lymph nodes which do not meet pathologic CT size criteria.  Uterus and bilateral ovaries are unremarkable.  Bladder is decompressed with indwelling Foley catheter and moderate nondependent  gas.  Degenerative changes of the visualized thoracolumbar spine. Prior bilateral inferior pubic ramus fractures. Right total hip arthroplasty. Mild degenerative changes of the left hip.  IMPRESSION: 4.6 cm eccentric cecal mass, worrisome for  primary colonic neoplasm. Colonoscopy is suggested for further evaluation.  7 x 11 mm left lower lobe nodule, metastasis not excluded.  3.5 cm left adrenal mass, unchanged since 2012, likely reflecting a benign adrenal adenoma.  Small left pleural effusion, possibly partially loculated.  Additional ancillary findings as above.   Electronically Signed   By: Julian Hy M.D.   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 I have ordered a colonoscopy prep for today and tentatively scheduled colonoscopy for tomorrow ( Dr Deatra Ina).   Principal Problem:   Atrial fibrillation with RVR Active Problems:   Bronchitis, chronic   Hypothyroidism   CAD (coronary artery disease)   Bipolar I disorder, most recent episode mixed   GERD (gastroesophageal reflux disease)   Other dysphagia   Congestive heart failure, unspecified   NSTEMI (non-ST elevated myocardial infarction)   Nonspecific abnormal finding in stool contents   Abnormal nuclear cardiac imaging test   Nonspecific (abnormal) findings on radiological and other examination of gastrointestinal tract     LOS: 5 days   [place] Dragon  04/18/2014, 3:05 PM

## 2014-04-18 NOTE — Progress Notes (Addendum)
SUBJECTIVE:  No complaints  OBJECTIVE:   Vitals:   Filed Vitals:   04/17/14 0544 04/17/14 1631 04/17/14 2107 04/18/14 0455  BP: 153/69 139/53 109/83 133/70  Pulse: 68 66 81 66  Temp: 97.7 F (36.5 C) 98.2 F (36.8 C) 97.5 F (36.4 C) 98.2 F (36.8 C)  TempSrc: Oral Oral Oral Oral  Resp: 18  20 18   Height:      Weight: 147 lb 11.3 oz (67 kg)   145 lb 11.6 oz (66.1 kg)  SpO2: 100% 95% 94% 93%   I&O's:   Intake/Output Summary (Last 24 hours) at 04/18/14 0730 Last data filed at 04/18/14 0600  Gross per 24 hour  Intake    330 ml  Output   2900 ml  Net  -2570 ml   TELEMETRY: Reviewed telemetry pt in NSR:     PHYSICAL EXAM General: Well developed, well nourished, in no acute distress Head: Eyes PERRLA, No xanthomas.   Normal cephalic and atramatic  Lungs:   Clear bilaterally to auscultation and percussion. Heart:   HRRR S1 S2 Pulses are 2+ & equal. Abdomen: Bowel sounds are positive, abdomen soft and non-tender without masses  Extremities:   No clubbing, cyanosis or edema.  DP +1 Neuro: Alert and oriented X 3. Psych:  Good affect, responds appropriately   LABS: Basic Metabolic Panel:  Recent Labs  04/16/14 0351 04/17/14 0416  NA 130* 132*  K 4.4 4.4  CL 91* 94*  CO2 26 26  GLUCOSE 116* 111*  BUN 17 12  CREATININE 1.05 0.87  CALCIUM 8.9 9.3   Liver Function Tests: No results found for this basename: AST, ALT, ALKPHOS, BILITOT, PROT, ALBUMIN,  in the last 72 hours No results found for this basename: LIPASE, AMYLASE,  in the last 72 hours CBC:  Recent Labs  04/16/14 0351 04/17/14 0416  WBC 13.7* 10.3  HGB 12.7 11.8*  HCT 36.7 35.3*  MCV 82.8 85.3  PLT 442* 457*   Cardiac Enzymes: No results found for this basename: CKTOTAL, CKMB, CKMBINDEX, TROPONINI,  in the last 72 hours BNP: No components found with this basename: POCBNP,  D-Dimer: No results found for this basename: DDIMER,  in the last 72 hours Hemoglobin A1C: No results found for this  basename: HGBA1C,  in the last 72 hours Fasting Lipid Panel: No results found for this basename: CHOL, HDL, LDLCALC, TRIG, CHOLHDL, LDLDIRECT,  in the last 72 hours Thyroid Function Tests: No results found for this basename: TSH, T4TOTAL, FREET3, T3FREE, THYROIDAB,  in the last 72 hours Anemia Panel:  Recent Labs  04/15/14 0817  VITAMINB12 580  FOLATE >20.0  FERRITIN 58  TIBC Not calculated due to Iron <10.  IRON <10*  RETICCTPCT 3.0   Coag Panel:   Lab Results  Component Value Date   INR 1.22 10/13/2012   INR 1.10 10/12/2012   INR 1.07 10/11/2012    RADIOLOGY: Ct Abdomen Pelvis Wo Contrast  04/17/2014   CLINICAL DATA:  Anemia, evaluate for colon mass  EXAM: CT ABDOMEN AND PELVIS WITHOUT CONTRAST  TECHNIQUE: Multidetector CT imaging of the abdomen and pelvis was performed following the standard protocol without IV contrast.  COMPARISON:  Partial comparison to CT chest dated 04/14/2014 and 10/31/2011  FINDINGS: Small left pleural effusion, possibly partially loculated. 7 x 11 mm left lower lobe nodule (series 4/image 2).  Cardiomegaly.  Unenhanced liver, spleen, and right adrenal gland are within normal limits.  Pancreas is notable for a coarse calcification in the pancreatic  head/uncinate process (series 2/image 31), nonspecific, but likely incidental.  3.5 x 2.3 cm left adrenal mass (series 2/ image 19), unchanged since 2012. While technically indeterminate, stability favors a benign etiology such as an adrenal adenoma.  Layering gallstones (series 2/ image 33), without associated inflammatory changes. No intrahepatic or extrahepatic ductal dilatation.  Scattered bilateral renal cysts, including a 2.4 x 3.4 cm mildly irregular cyst in the left lower pole (series 2/image 34). Additional 13 mm probable hemorrhagic cysts in the left lower pole (series 2/image 37). No renal calculi or hydronephrosis.  No evidence of bowel obstruction. 1.9 x 4.3 x 4.6 cm eccentric cecal mass (series 2/image 55),  worrisome for primary chronic neoplasm. Extension into the surrounding pericolonic fat is possible. 5 mm in the common node (series 2/image 48), within normal limits for size. Diverticulosis, without associated inflammatory changes.  Atherosclerotic calcifications of the abdominal aorta and branch vessels.  No abdominopelvic ascites.  Small retroperitoneal lymph nodes which do not meet pathologic CT size criteria.  Uterus and bilateral ovaries are unremarkable.  Bladder is decompressed with indwelling Foley catheter and moderate nondependent gas.  Degenerative changes of the visualized thoracolumbar spine. Prior bilateral inferior pubic ramus fractures. Right total hip arthroplasty. Mild degenerative changes of the left hip.  IMPRESSION: 4.6 cm eccentric cecal mass, worrisome for primary colonic neoplasm. Colonoscopy is suggested for further evaluation.  7 x 11 mm left lower lobe nodule, metastasis not excluded.  3.5 cm left adrenal mass, unchanged since 2012, likely reflecting a benign adrenal adenoma.  Small left pleural effusion, possibly partially loculated.  Additional ancillary findings as above.   Electronically Signed   By: Julian Hy M.D.   On: 04/17/2014 09:58   Dg Chest 2 View  04/13/2014   CLINICAL DATA:  DEHYDRATION  EXAM: CHEST - 2 VIEW  COMPARISON:  11/15/2013  FINDINGS: Stable cardiomegaly. Improving left lower lung airspace disease, with mild scattered residual airspace opacities. There is some residual linear scarring or subsegmental atelectasis in the left mid lung. Right lung remains clear. Atheromatous aorta. . No effusion. Degenerative changes in bilateral shoulders.  IMPRESSION: 1. Partial improvement in the left lower lung airspace disease. 2. Stable cardiomegaly   Electronically Signed   By: Arne Cleveland M.D.   On: 04/13/2014 15:56   Ct Angio Chest Pe W/cm &/or Wo Cm  04/14/2014   CLINICAL DATA:  Acute respiratory failure with shortness of breath. Evaluate for pulmonary  embolism.  EXAM: CT ANGIOGRAPHY CHEST WITH CONTRAST  TECHNIQUE: Multidetector CT imaging of the chest was performed using the standard protocol during bolus administration of intravenous contrast. Multiplanar CT image reconstructions and MIPs were obtained to evaluate the vascular anatomy.  CONTRAST:  150mL OMNIPAQUE IOHEXOL 350 MG/ML SOLN  COMPARISON:  10/31/2011  FINDINGS: THORACIC INLET/BODY WALL:  No acute abnormality.  MEDIASTINUM:  Mild cardiomegaly. No pericardial effusion. Diffuse atherosclerotic calcification, including the coronary arteries. There is intermittent respiratory motion, which limits sensitivity for detecting pulmonary embolus at the affected levels. Exam is overall diagnostic, and there is no pulmonary embolism identified. No aortic aneurysm or dissection. No lymphadenopathy. Negative esophagus.  LUNG WINDOWS:  There is a small, layering left pleural effusion. The fluid is water density. There is mild associated atelectasis. Although limited by motion artifact, there is a nodule in the upper lingula which measures 7 mm. 13 nodular density in the posterior basilar segment left lower lobe, subpleural. In the medial basilar segment left lower lobe on image 52, there is a 12 mm  nodule. Centrilobular emphysema.  UPPER ABDOMEN:  A soft tissue density nodule arising from the left adrenal gland is stable in size, up to 3.5 cm. Stability is consistent with benign lesion, most likely lipid poor adenoma.  OSSEOUS:  No acute fracture.  No suspicious lytic or blastic lesions.  Review of the MIP images confirms the above findings.  IMPRESSION: 1. Negative for pulmonary embolism. 2. Small left pleural effusion. 3. Three left lung nodules, measuring up to 13 mm. Bronchogenic carcinoma is a diagnostic possibility, recommend follow-up chest CT in 3 months. 4. Emphysema.   Electronically Signed   By: Jorje Guild M.D.   On: 04/14/2014 21:12   Nm Myocar Single W/spect W/wall Motion And Ef  04/16/2014    HISTORY OF PRESENT ILLNESS: Nuclear Med Background  Indication for Stress Test:  NSTEMI, dyspnea  History: 80 yo female with multiple and complex medical problems including HTN, HLD, dementia, CHF, CAD- I see no record for CAD., hypothyroidism, who presented to [facility] ED with main concern of several days duration of progressively worsening fatigue, lethargy, shortness of breath. Please note that pt is unable to provide details and daughter at bedside was primary historian. Pt apparently treated for PNA in Jan 2015 and again last week and is still on ABX but per daughter, pt has been moor sleepy and not eating much. No reported chest pain or abdominal pain, no urinary concerns. Pt apparently has chronic dyspnea but slightly worse over the past several days. In ED, pt noted to be in atrial fibrillation with HR in 150's, initial troponin elevated at 1.76. Na 122. Please note that pt has been denying chest pain since arrival to the ED. Cardiology has been asked to see.  Cardiac Risk Factors:  HTN, HPL, CHF, CAD  Symptoms:  Dyspnea, fatigue, NSTEMI  PROCEDURE: Nuclear Pre-Procedure  Caffeine/Decaff Intake:  NPO After: Midnight.  Lungs:  O2 Stat:  IV 0.9% NS with Angio Cath:  Chest Size (in):  Cup Size:  Height: 5'4"  Weight:  144 lbs  BMI:  Tech Comments:  Nuclear Med Study  1 or 2 day study: 1  Stress Test Type: Lexiscan  Reading MD: Hilty  Order Authorizing Provider: Brackbill  Resting Radionuclide: Sestamibi  Resting Radionuclide Dose:  10 mCi  Stress Radionuclide: Sestamibi  Stress Radionuclide Dose:  30 mCi  Stress Protocol  Rest HR: 90  Stress HR: 75  Rest BP: 195/67  Stress BP: 181/68  Exercise Time (min):  METS:  Dose of Adenosine (mg):  Dose of Lexiscan:  0.4 mg  Dose of Atropine (mg):  Dose of Dobutamine:  Stress Test Technologist:  Nuclear Technologist:  Rest Procedure:  Stress Procedure:  Transient Ischemic Dilatation (Normal <1.22): 0.86  Lung/Heart Ratio (Normal <0.45): 0.47  QGS EDV:  47 ml  QGS ESV:  21 ml   LV Ejection Fraction: 54%  Rest ECG: Normal sinus rhythm  Stress ECG:  No lexiscan EKG changes  Raw Data Images:  No significant extracardiac activity  Stress Images:  Moderate sized and intensity inferolateral defect  Rest Images:  Mild inferolateral defect  Subtraction (SDS): 4  IMPRESSION: Exercise Capacity: N/A  BP Response: Normal  Clinical Symptoms: None  ECG Impression:  No lexiscan EKG changes  Comparison with Prior Nuclear Study: None  Final Impression:  Moderate size and intensity, partially reversible inferolateral perfusion defect (SDS 4), consistent with ischemia - Extent 19%. LVEF 54%, normal wall motion.   Electronically Signed   By: Pixie Casino  On: 04/16/2014 14:20   Dg Chest Port 1 View  04/14/2014   CLINICAL DATA:  Shortness of breath  EXAM: PORTABLE CHEST - 1 VIEW  COMPARISON:  04/13/2014  FINDINGS: Mild cardiomegaly which is stable from yesterday. Mediastinal contours are distorted by leftward rotation. There are streaky lung opacities at the bases, left more than right. No change from prior to suggest pneumonia, edema, effusion, or pneumothorax.  IMPRESSION: Unchanged bibasilar lung opacities, morphology favoring atelectasis.   Electronically Signed   By: Jorje Guild M.D.   On: 04/14/2014 07:08   Assessment/Plan  Principal Problem:  Atrial fibrillation with RVR, though was in SR on admit. Now back in SR after dilt. On ASA - on coreg now but use with caution as pt had bradycardia in the 40s once she went back to SR in 11/2013- tachy/brady syndrome  Active Problems:  Bronchitis, chronic  Hypothyroidism-TSH 1.69  CAD (coronary artery disease)- presumed no documentation of CAD She does have PVD  Bipolar I disorder, most recent episode mixed  GERD (gastroesophageal reflux disease)  Other dysphagia  Congestive heart failure, unspecified -most likely diastolic  NSTEMI (non-ST elevated myocardial infarction) with 2 episodes a fib one with PNA and now one with bronchitis and  NSTEMI, may be type 2, demand ischemia but with age stress test may be warranted vs. Cath  Hyponatremic- on lasix as outpt.  Elevated d dimer at 1.25  Anemia H/H 8.3/24.5 secondary to cecal mass  HTN on admit 213/85 now controlled to labile  CKD 3  Cecal mass c/w CA with possible mets to the lungs   PLAN:  Probably type 2 NSTEMI, but the likelihood of underlying CAD is high in the elderly lady with PAD. Normal LVEF on relatively recent echo. No angina. Her nuclear stress test shows a moderately reversible defect c/w ischemia and normal LVF. She now has been found to have a nonobstructing cecal mass that is consistent with CA and a lung nodule on CT.  This is the source of her anemia.  She is reluctant to have the colonoscopy done.  Dr. Olevia Perches will talk with her today to decide if she will proceed with colonoscopy and cecal resection.  She will need a cath prior to undergoing abdominal surgery given her high risk nuclear stress test. ASA is on hold due to bleeding and cecal mass.   Sueanne Margarita, MD  04/18/2014  7:30 AM

## 2014-04-19 ENCOUNTER — Inpatient Hospital Stay (HOSPITAL_COMMUNITY): Payer: Medicare Other | Admitting: Anesthesiology

## 2014-04-19 ENCOUNTER — Encounter (HOSPITAL_COMMUNITY): Payer: Medicare Other | Admitting: Anesthesiology

## 2014-04-19 ENCOUNTER — Encounter (HOSPITAL_COMMUNITY)
Admission: EM | Disposition: A | Discharge status: Skilled Nursing Facility | Payer: Self-pay | Source: Home / Self Care | Attending: Internal Medicine

## 2014-04-19 ENCOUNTER — Encounter (HOSPITAL_COMMUNITY): Payer: Self-pay | Admitting: Internal Medicine

## 2014-04-19 DIAGNOSIS — I472 Ventricular tachycardia: Secondary | ICD-10-CM | POA: Diagnosis not present

## 2014-04-19 DIAGNOSIS — J189 Pneumonia, unspecified organism: Secondary | ICD-10-CM | POA: Diagnosis not present

## 2014-04-19 DIAGNOSIS — D126 Benign neoplasm of colon, unspecified: Secondary | ICD-10-CM

## 2014-04-19 DIAGNOSIS — I214 Non-ST elevation (NSTEMI) myocardial infarction: Secondary | ICD-10-CM | POA: Diagnosis not present

## 2014-04-19 DIAGNOSIS — K5730 Diverticulosis of large intestine without perforation or abscess without bleeding: Secondary | ICD-10-CM

## 2014-04-19 DIAGNOSIS — D490 Neoplasm of unspecified behavior of digestive system: Secondary | ICD-10-CM

## 2014-04-19 DIAGNOSIS — I5033 Acute on chronic diastolic (congestive) heart failure: Secondary | ICD-10-CM | POA: Diagnosis not present

## 2014-04-19 DIAGNOSIS — J42 Unspecified chronic bronchitis: Secondary | ICD-10-CM

## 2014-04-19 DIAGNOSIS — I4729 Other ventricular tachycardia: Secondary | ICD-10-CM | POA: Diagnosis not present

## 2014-04-19 HISTORY — PX: COLONOSCOPY: SHX5424

## 2014-04-19 LAB — CBC
HEMATOCRIT: 33.9 % — AB (ref 36.0–46.0)
Hemoglobin: 11.2 g/dL — ABNORMAL LOW (ref 12.0–15.0)
MCH: 28.2 pg (ref 26.0–34.0)
MCHC: 33 g/dL (ref 30.0–36.0)
MCV: 85.4 fL (ref 78.0–100.0)
Platelets: 508 10*3/uL — ABNORMAL HIGH (ref 150–400)
RBC: 3.97 MIL/uL (ref 3.87–5.11)
RDW: 13.8 % (ref 11.5–15.5)
WBC: 9.2 10*3/uL (ref 4.0–10.5)

## 2014-04-19 LAB — BASIC METABOLIC PANEL
BUN: 18 mg/dL (ref 6–23)
CALCIUM: 9.2 mg/dL (ref 8.4–10.5)
CHLORIDE: 99 meq/L (ref 96–112)
CO2: 21 mEq/L (ref 19–32)
Creatinine, Ser: 1.17 mg/dL — ABNORMAL HIGH (ref 0.50–1.10)
GFR calc non Af Amer: 42 mL/min — ABNORMAL LOW (ref >90)
GFR, EST AFRICAN AMERICAN: 48 mL/min — AB (ref >90)
Glucose, Bld: 102 mg/dL — ABNORMAL HIGH (ref 70–99)
Potassium: 4.7 mEq/L (ref 3.7–5.3)
Sodium: 136 mEq/L — ABNORMAL LOW (ref 137–147)

## 2014-04-19 SURGERY — COLONOSCOPY
Anesthesia: Monitor Anesthesia Care

## 2014-04-19 SURGERY — COLONOSCOPY WITH PROPOFOL
Anesthesia: Monitor Anesthesia Care

## 2014-04-19 MED ORDER — LACTATED RINGERS IV SOLN
INTRAVENOUS | Status: DC | PRN
  Administered 2014-04-19: 09:00:00 via INTRAVENOUS

## 2014-04-19 MED ORDER — ATORVASTATIN CALCIUM 10 MG PO TABS
10.0000 mg | ORAL_TABLET | Freq: Every day | ORAL | Status: DC
  Administered 2014-04-19 – 2014-04-25 (×6): 10 mg via ORAL
  Filled 2014-04-19 (×9): qty 1

## 2014-04-19 MED ORDER — LACTATED RINGERS IV SOLN: INTRAVENOUS | Status: DC

## 2014-04-19 MED ORDER — FENTANYL CITRATE 0.05 MG/ML IJ SOLN: 25.0000 ug | INTRAMUSCULAR | Status: DC | PRN

## 2014-04-19 MED ORDER — PROPOFOL 10 MG/ML IV BOLUS
INTRAVENOUS | Status: DC | PRN
  Administered 2014-04-19 (×2): 25 mg via INTRAVENOUS
  Administered 2014-04-19: 50 mg via INTRAVENOUS
  Administered 2014-04-19: 25 mg via INTRAVENOUS

## 2014-04-19 MED ORDER — LACTATED RINGERS IV SOLN
INTRAVENOUS | Status: DC
  Administered 2014-04-19: 1000 mL via INTRAVENOUS

## 2014-04-19 MED ORDER — PROPOFOL 10 MG/ML IV BOLUS
INTRAVENOUS | Status: AC
  Filled 2014-04-19: qty 20

## 2014-04-19 NOTE — Consult Note (Addendum)
Jenny BENEGAS 1927-06-19  [id number].   Primary Care MD: Dr. Early Osmond Requesting MD: Dr. Erskine Emery Chief Complaint/Reason for Consult: Cecal mass HPI: This is a very sweet 80 year old white female with a multitude of medical problems. She was recently diagnosed with pneumonia and has been treated for the last several days. She [facility] to [facility]. She was encouraged to come to [facility] emergency department for further evaluation of this. Upon arrival she was noted to have atrial fibrillation with RVR. Her troponins were also noted to be elevated at 1.7. She was thought to have an NSTEMI type II. She did have a Myoview which revealed some ischemia. She was also noted to have anemia and was found to be fecal occult positive. She had a CT scan which revealed a cecal mass. She underwent a colonoscopy today and was found to have a mass at her ileocecal valve. This was encompassing half of her cecum. It is not obstructing but it is bleeding. We have been asked to evaluate the patient for surgical resection. The patient denies any abdominal pain. She has noted some nausea and a little bit of emesis the past couple weeks. However this is unclear if this may be related to her antibiotic or not if she is still passing stool and flatus.  ROS please see history of present illness otherwise all other systems have been reviewed and are negative.  Family History  Problem Relation Age of Onset  . Diabetes Father     Past Medical History  Diagnosis Date  . Hypertension   . Coronary artery disease   . Heart murmur   . Hypothyroidism 05-02-12    tx. Synthroid  . Headache(784.0) 05-02-12    past hx. migrianes-many yrs ago  . Arthritis 05-02-12    osteoarthritis-rt. hip  . Pneumonia 05-02-12    mild case  . 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 Acute upper respiratory infections of unspecified site 2014  . Unspecified venous (peripheral)  insufficiency 2013  . Senile osteoporosis 2013  . Unspecified closed fracture of pelvis 10/13/2012  . Cellulitis and abscess of leg, except foot 2013  . Altered mental status 2013  . Chronic pain syndrome 2013  . Urinary tract infection, site not specified   . Hyposmolality and/or hyponatremia 05/2012  . Congestive heart failure, unspecified 05/2012  . Pneumonia, organism unspecified 05/2012  . Pressure ulcer, buttock(707.05) 05/2012  . Hypoxemia 05/2012  . Reflux esophagitis 3013  . Unspecified constipation 2013  . Acute posthemorrhagic anemia 05/2012  . Coronary atherosclerosis of native coronary artery 05/2012  . Pain in joint, pelvic region and thigh 04/2012  . Dysphagia, unspecified(787.20) 03/2012  . Benign neoplasm of adrenal gland 10/2011  . Alzheimer's disease 06/2011  . Other seborrheic keratosis 2012  . Hypertensive renal disease, benign 2012  . Pain in joint, lower leg 2012  . Memory loss 09/2010  . Spinal stenosis, lumbar region, without neurogenic claudication 2011  . Unspecified vitamin D deficiency 2010  . Unspecified disorder of kidney and ureter 2010  . Edema 2010  . Other malaise and fatigue 2009  . Congenital factor VIII disorder 2009  . Senile dementia with delusional features 2008  . Generalized hyperhidrosis 2007  . Irritable bowel syndrome 2007  . Unspecified chronic bronchitis 2006  . Depression 06/2005     depression only,tx. oral meds  . Shortness of breath 2005  . Anxiety 2004  . Unspecified late effects of cerebrovascular disease  2004  . Migraine with aura, without mention of intractable migraine without mention of status migrainosus 2003  . Tinnitus 2003  . Other symptoms involving cardiovascular system 2003  . Hyperlipidemia 2002    Past Surgical History  Procedure Laterality Date  . Tonsillectomy    . Tubal ligation  05-02-12  .  tummy tuck  05-02-12    many yrs ago  . Cataract extraction, bilateral  05-02-12    bilateral  . Retinal detachment  surgery  05-02-12    left  . Total hip arthroplasty  05/09/2012    Procedure: TOTAL HIP ARTHROPLASTY ANTERIOR APPROACH;  Surgeon: Mcarthur Rossetti, MD;  Location: [facility];  Service: Orthopedics;  Laterality: Right;  . Dental surgery  08/11/13    3 teeth surgical removed by Dr. Lewanda Rife  . Esophagogastroduodenoscopy N/A 04/16/2014    Procedure: ESOPHAGOGASTRODUODENOSCOPY (EGD);  Surgeon: Lafayette Dragon, MD;  Location: [facility];  Service: Endoscopy;  Laterality: N/A;    Social History:  reports that she quit smoking about 5 years ago. Her smoking use included Cigarettes. She has a 62 pack-year smoking history. She has never used smokeless tobacco. She reports that she does not drink alcohol or use illicit drugs.  Allergies:  Allergies  Allergen Reactions  . Levaquin [Levofloxacin In D5w] Hives  . Penicillins Rash    Medications Prior to Admission  Medication Sig Dispense Refill  . acetaminophen (TYLENOL) 325 MG tablet Take 325 mg by mouth daily as needed for mild pain. One daily as needed      . albuterol (PROVENTIL HFA;VENTOLIN HFA) 108 (90 BASE) MCG/ACT inhaler Inhale 2 puffs into the lungs every 6 (six) hours as needed for wheezing or shortness of breath.      . Ascorbic Acid (VITAMIN C) 1000 MG tablet Take 1,000 mg by mouth daily with breakfast.      . aspirin 325 MG tablet Take 1 tablet (325 mg total) by mouth daily.  30 tablet  0  . brimonidine-timolol (COMBIGAN) 0.2-0.5 % ophthalmic solution Place 1 drop into both eyes every 12 (twelve) hours.       . calcitonin, salmon, (MIACALCIN/FORTICAL) 200 UNIT/ACT nasal spray Place 1 spray into alternate nostrils daily. Use one spray in one nostril once daily. Alternate nostrils daily      . calcium-vitamin D (OSCAL WITH D) 500-200 MG-UNIT per tablet Take 1 tablet by mouth 3 (three) times daily. Take one tablet three times daily for calcium supplementation      . cefUROXime (CEFTIN) 500 MG tablet Take 500 mg by mouth 2 (two) times daily with  a meal. For 10 days.      . clonazePAM (KLONOPIN) 0.25 MG disintegrating tablet Take 0.25 mg by mouth every 4 (four) hours as needed (breakthrough anxiety).      . clonazePAM (KLONOPIN) 0.5 MG tablet Take 0.5 mg by mouth at bedtime.      . ferrous sulfate 325 (65 FE) MG tablet Take 325 mg by mouth 2 (two) times daily with a meal.      . furosemide (LASIX) 20 MG tablet Take 20 mg by mouth 2 (two) times daily.      Marland Kitchen ibuprofen (ADVIL,MOTRIN) 200 MG tablet Take 400 mg by mouth 3 (three) times daily with meals.      Marland Kitchen ipratropium-albuterol (DUONEB) 0.5-2.5 (3) MG/3ML SOLN Take 3 mLs by nebulization every 6 (six) hours as needed (cough/wheeze.). AND every 6 hours for 5 days starting 04/09/14.  (Last dose 04/14/14 at 0000.)      .  lamoTRIgine (LAMICTAL) 200 MG tablet Take 200 mg by mouth daily.      . levothyroxine (SYNTHROID, LEVOTHROID) 88 MCG tablet Take 88 mcg by mouth daily before breakfast.      . LORazepam (ATIVAN) 0.5 MG tablet Take 0.5 mg by mouth every 4 (four) hours as needed for anxiety (IN THE EVENT THAT CLONAZEPAM IS UNAVAILABLE.).      . multivitamin-lutein (OCUVITE-LUTEIN) CAPS Take 1 capsule by mouth daily.      . naproxen sodium (ANAPROX) 220 MG tablet Take 220 mg by mouth daily as needed. Take one daily      . nitroGLYCERIN (NITROSTAT) 0.4 MG SL tablet Place 0.4 mg under the tongue every 5 (five) minutes as needed for chest pain.      . omeprazole (PRILOSEC) 20 MG capsule Take 20 mg by mouth 2 (two) times daily before a meal.      . omeprazole (PRILOSEC) 40 MG capsule Take 40 mg by mouth 4 (four) times daily.      . pilocarpine (SALAGEN) 5 MG tablet Take 5 mg by mouth 3 (three) times daily.      . potassium chloride SA (K-DUR,KLOR-CON) 20 MEQ tablet Take 20 mEq by mouth 2 (two) times daily.      . saccharomyces boulardii (FLORASTOR) 250 MG capsule Take 250 mg by mouth 2 (two) times daily. For 10 days.      . senna (SENOKOT) 8.6 MG TABS Take 2 tablets by mouth at bedtime.      .  sertraline (ZOLOFT) 25 MG tablet Take 1 tablet (25 mg total) by mouth daily.  30 tablet  0    Blood pressure 169/39, pulse 64, temperature 98.3 F (36.8 C), temperature source Oral, resp. rate 18, height 5' 4" (1.626 m), weight 144 lb 10 oz (65.6 kg), SpO2 98.00%. Physical Exam: General: pleasant, WD, WN white female who is laying in bed in NAD HEENT: head is normocephalic, atraumatic.  Sclera are noninjected.  PERRL.  Ears and nose without any masses or lesions.  Mouth is pink and moist Heart: regular, rate, and rhythm.  Normal s1,s2. No obvious murmurs, gallops, or rubs noted.  Palpable radial and pedal pulses bilaterally Lungs: few rhonchi are noted, R>L, no wheezes, or rales noted.  Respiratory effort nonlabored Abd: soft, NT, ND, +BS, no masses, hernias, or organomegaly MS: all 4 extremities are symmetrical with no cyanosis, clubbing, or edema. Skin: warm and dry with no masses, lesions, or rashes Psych: A&O with an appropriate affect, but forgetful at times.  Has Alzheimer's.    Results for orders placed during the hospital encounter of 04/13/14 (from the past 48 hour(s))  CEA     Status: Abnormal   Collection Time    04/18/14  7:15 AM      Result Value Ref Range   CEA 48.0 (*) 0.0 - 5.0 ng/mL   Comment: Performed at [facility]  CBC     Status: Abnormal   Collection Time    04/19/14  3:50 AM      Result Value Ref Range   WBC 9.2  4.0 - 10.5 K/uL   RBC 3.97  3.87 - 5.11 MIL/uL   Hemoglobin 11.2 (*) 12.0 - 15.0 g/dL   HCT 33.9 (*) 36.0 - 46.0 %   MCV 85.4  78.0 - 100.0 fL   MCH 28.2  26.0 - 34.0 pg   MCHC 33.0  30.0 - 36.0 g/dL   RDW 13.8  11.5 - 15.5 %     Platelets 508 (*) 150 - 400 K/uL  BASIC METABOLIC PANEL     Status: Abnormal   Collection Time    04/19/14  3:50 AM      Result Value Ref Range   Sodium 136 (*) 137 - 147 mEq/L   Potassium 4.7  3.7 - 5.3 mEq/L   Chloride 99  96 - 112 mEq/L   CO2 21  19 - 32 mEq/L   Glucose, Bld 102 (*) 70 - 99 mg/dL   BUN  18  6 - 23 mg/dL   Creatinine, Ser 1.17 (*) 0.50 - 1.10 mg/dL   Calcium 9.2  8.4 - 10.5 mg/dL   GFR calc non Af Amer 42 (*) >90 mL/min   GFR calc Af Amer 48 (*) >90 mL/min   Comment: (NOTE)     The eGFR has been calculated using the CKD EPI equation.     This calculation has not been validated in all clinical situations.     eGFR's persistently <90 mL/min signify possible Chronic Kidney     Disease.   No results found.     Assessment/Plan 1. Mass at her ileocecal valve, concerning for neoplasm 2. NSTEMI, type II 3. Atrial fibrillation with RVR, now in sinus rhythm 4. Recent pneumonia Patient Active Problem List   Diagnosis Date Noted  . Benign neoplasm of colon 04/19/2014  . Diverticulosis of colon (without mention of hemorrhage) 04/19/2014  . Colon cancer 04/18/2014  . Abnormal nuclear cardiac imaging test 04/17/2014  . Nonspecific (abnormal) findings on radiological and other examination of gastrointestinal tract 04/17/2014  . Chronic diastolic congestive heart failure 04/15/2014  . Nonspecific abnormal finding in stool contents 04/15/2014  . Atrial fibrillation with RVR 04/13/2014  . NSTEMI (non-ST elevated myocardial infarction) 04/13/2014  . Congestive heart failure, unspecified 04/07/2014  . Other dysphagia 02/04/2014  . GERD (gastroesophageal reflux disease) 01/28/2014  . Paroxysmal supraventricular tachycardia 11/26/2013  . Chest pain 11/26/2013  . PAF (paroxysmal atrial fibrillation) 11/17/2013  . Anemia 11/16/2013  . Hyponatremia 11/16/2013  . Debility 10/16/2013  . Abnormality of gait 08/28/2013  . Bipolar I disorder, most recent episode mixed 08/28/2013  . COPD GOLD II 06/17/2013  . Pain in joint, shoulder region 06/07/2013  . Chronic kidney disease, stage III (moderate) 03/12/2013  . 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 Fracture of superior pubic ramus 10/10/2012  . CAD (coronary artery disease) 10/10/2012  . Constipation 10/10/2012  .  Degenerative arthritis of hip 05/09/2012  . Keratosis, seborrheic 02/01/2011  . Hypertensive renal disease, benign 12/26/2010  . Pain in joint involving lower leg 12/26/2010  . Spinal stenosis of lumbar region 01/30/2010  . Unspecified vitamin D deficiency 09/06/2009  . Edema 07/21/2009  . Fatigue 06/17/2008  . Senile dementia with delusional features 04/10/2007  . Spastic colon 02/20/2006  . Bronchitis, chronic 09/06/2005  . Hypothyroidism 06/21/2005  . Depressive disorder 06/21/2005  . Encounter for long-term (current) use of medications 06/21/2005  . Osteoarthritis 06/28/2004  . Shortness of breath 06/28/2004  . Dementia in conditions classified elsewhere without behavioral disturbance(294.10) 11/03/2003  . Anxiety 11/03/2003  . Late effects of CVA (cerebrovascular accident) 06/18/2003  . Migraine with aura, without mention of intractable migraine without mention of status migrainosus 09/29/2002  . Tinnitus 09/29/2002  . Carotid bruit 09/29/2002  . Vertigo 06/02/2002  . Hyperlipidemia 09/30/2001   Plan: I have contacted the hospitalist to talk to the cardiologist to go ahead and proceed with cardiac catheterization. The lesion does not appear to   be obstructing however the patient has had a couple weeks of nausea and some emesis. She is also anemic and bleeding from this which I think has exacerbated her atrial fibrillation and potentially hyponatremia. She would benefit from surgical resection; however, she is at least moderate risk given she had a heart attack just one week ago on admission. Usually, we try to hold off on surgical procedures for at least several months after have a myocardial infarction, however she may obstruct prior to us being able to wait that long. I will discuss this with Dr. Rosenbower, but I suspect if she is cleared from a cardiac standpoint she will likely require surgical resection this admission. Please continue on a clear liquid diet to help prevent further  colonic preparation prior to surgery. Thank you for this consultation.   E  04/19/2014, 1:33 PM Pager: [phone]  

## 2014-04-19 NOTE — Progress Notes (Signed)
    Subjective:  No Chest pain or dyspnea. Colonoscopy planned this am.   Objective:  Vital Signs in the last 24 hours: Temp:  [97.4 F (36.3 C)-98.7 F (37.1 C)] 98.7 F (37.1 C) (06/08 0536) Pulse Rate:  [65-83] 83 (06/08 0536) Resp:  [18-20] 18 (06/08 0536) BP: (138-154)/(50-66) 154/56 mmHg (06/08 0536) SpO2:  [91 %-100 %] 92 % (06/08 0536) Weight:  [65.6 kg (144 lb 10 oz)] 65.6 kg (144 lb 10 oz) (06/08 0536)  Intake/Output from previous day: 06/07 0701 - 06/08 0700 In: 240 [P.O.:240] Out: 1850 [Urine:1850]  Physical Exam: Pt is alert and oriented, pleasant elderly woman in NAD HEENT: normal Neck: JVP - normal Lungs: CTA bilaterally CV: RRR without murmur or gallop Abd: soft, NT, Positive BS, no hepatomegaly Ext: no C/C/E Skin: warm/dry no rash   Lab Results:  Recent Labs  04/17/14 0416 04/19/14 0350  WBC 10.3 9.2  HGB 11.8* 11.2*  PLT 457* 508*    Recent Labs  04/17/14 0416 04/19/14 0350  NA 132* 136*  K 4.4 4.7  CL 94* 99  CO2 26 21  GLUCOSE 111* 102*  BUN 12 18  CREATININE 0.87 1.17*   No results found for this basename: TROPONINI, CK, MB,  in the last 72 hours  Cardiac Studies: Myoview: Final Impression:  Moderate size and intensity, partially reversible inferolateral  perfusion defect (SDS 4), consistent with ischemia - Extent 19%.  LVEF 54%, normal wall motion.   Tele: Personally reviewed, normal sinus rhythm  Assessment/Plan:  1. NSTEMI, Type 2 2. AF with RVR 3. Known PAD 4. Cecal mass   Abnormal Myoview at least moderate risk. As per Dr Radford Pax, if patient requires abdominal surgery, cardiac cath would be recommended prior to surgery to rule out high-risk coronary anatomy. Will continue to follow and review overall plan per GI and TRH services. Will arrange cath pending plans for surgery. Continue beta-blocker. No antiplatelet drugs secondary to anemia/likely colon CA. Add low-dose statin.   Sherren Mocha, M.D. 04/19/2014, 7:01  AM

## 2014-04-19 NOTE — Op Note (Signed)
[facility] [place], [id number]   COLONOSCOPY PROCEDURE REPORT  PATIENT: Jenny [name], Jenny [name]  MR#: [id number] BIRTHDATE: [birth year] , 86  yrs. old GENDER: Female ENDOSCOPIST: [name], MD REFERRED MK:[id number] Nyoka Cowden, M.D. PROCEDURE DATE:  04/19/2014 PROCEDURE:   Colonoscopy with biopsy ASA CLASS:   Class III INDICATIONS:an abnormal CT - cecal mass MEDICATIONS: MAC sedation, administered by CRNA  DESCRIPTION OF PROCEDURE:   After the risks benefits and alternatives of the procedure were thoroughly explained, informed consent was obtained.  A digital rectal exam revealed no abnormalities of the rectum.   The Pentax Colonoscope [id number] endoscope was introduced through the anus and advanced to the cecum, which was identified by both the appendix and ileocecal valve. No adverse events experienced.   The quality of the prep was Miralax fair  The instrument was then slowly withdrawn as the colon was fully examined.      COLON FINDINGS: In the cecum there was a large, exophytic, bleeding mass encompassing the ileocecal valve and at least one half the circumference of the cecum.  Multiple biopsies were taken. A single sessile 2 mm polyp was seen in the cecum.  There was a sessile 2 mm polyp in the proximal ascending colon.   Moderate diverticulosis was noted in the sigmoid colon.   There was mild scattered diverticulosis noted in the descending colon, transverse colon, and ascending colon.   In the proximal sigmoid colon there was a 3 mm hyperplastic-appearing polyp.   In the proximal sigmoid colon there was a 3 mm hyperplastic-appearing polyp.  Retroflexed views revealed no abnormalities. The time to cecum=  .  Withdrawal time=13 minutes 0 seconds.  The scope was withdrawn and the procedure completed. COMPLICATIONS: There were no complications.  ENDOSCOPIC IMPRESSION: 1.   malignant neoplasm of the ileocecal valve 2.  diminutive sessile polyps,  cecum and ascending colon 3.  diverticulosis  RECOMMENDATIONS: surgical resection  eSigned:  [name], MD 04/19/2014 9:42 AM   cc:   PATIENT NAME:  Jenny [name], Jenny [name] MR#: [id number]

## 2014-04-19 NOTE — Anesthesia Postprocedure Evaluation (Signed)
  Anesthesia Post-op Note  Patient: Jenny [name]  Procedure(s) Performed: Procedure(s) (LRB): COLONOSCOPY (N/A)  Patient Location: PACU  Anesthesia Type: MAC  Level of Consciousness: awake and alert   Airway and Oxygen Therapy: Patient Spontanous Breathing  Post-op Pain: mild  Post-op Assessment: Post-op Vital signs reviewed, Patient's Cardiovascular Status Stable, Respiratory Function Stable, Patent Airway and No signs of Nausea or vomiting  Last Vitals:  Filed Vitals:   04/19/14 1010  BP:   Pulse: 64  Temp:   Resp: 18    Post-op Vital Signs: stable   Complications: No apparent anesthesia complications

## 2014-04-19 NOTE — Progress Notes (Signed)
Colonoscopy demonstrated a large mass at the ileocecal valve which is undoubtedly malignant.  This will require resection.  At issue is the lung nodule.  Would consider a PET scan but will ask oncology to evaluate.  She'll require cardiac clearance prior to surgery.  We'll keep her on clear liquids in anticipation of surgery.

## 2014-04-19 NOTE — Consult Note (Signed)
I have seen and examined Jenny [name]. Her daughters were in the room.  She has a grossly malignant-appearing cecal neoplasm and biopsy results are pending. She also is trying to get over her pneumonia (still has a cough) and has evidence of a myocardial infarction on admission. Prior to surgical resection, we'll need to make sure the pneumonia is clinically and radiographically cleared and define her cardiac risk. She has no symptoms of obstruction at this time.  I see no reason for an emergency surgical resection currently.   I did discuss laparoscopic possible open partial colectomy with her and her family.  I  explained the procedure and risks of colon resection.  Risks include but are not limited to bleeding, infection, wound problems, anesthesia, anastomotic leak, need for colostomy, injury to intraabominal organs (such as intestine, spleen, kidney, bladder, ureter, etc.), ileus.

## 2014-04-19 NOTE — Transfer of Care (Signed)
Immediate Anesthesia Transfer of Care Note  Patient: Jenny [name]  Procedure(s) Performed: Procedure(s): COLONOSCOPY (N/A)  Patient Location: PACU  Anesthesia Type:MAC  Level of Consciousness: awake, sedated and patient cooperative  Airway & Oxygen Therapy: Patient Spontanous Breathing and Patient connected to face mask oxygen  Post-op Assessment: Report given to PACU RN and Post -op Vital signs reviewed and stable  Post vital signs: Reviewed and stable  Complications: No apparent anesthesia complications

## 2014-04-19 NOTE — Anesthesia Preprocedure Evaluation (Addendum)
Anesthesia Evaluation  Patient identified by MRN, date of birth, ID band Patient awake    Reviewed: Allergy & Precautions, H&P , NPO status , Patient's Chart, lab work & pertinent test results, reviewed documented beta blocker date and time   Airway Mallampati: II TM Distance: >3 FB Neck ROM: full    Dental no notable dental hx. (+) Teeth Intact, Dental Advisory Given   Pulmonary shortness of breath and with exertion, COPD COPD inhaler, former smoker,  Chronic bronchitis breath sounds clear to auscultation  Pulmonary exam normal       Cardiovascular hypertension, Pt. on home beta blockers and Pt. on medications + CAD, + Past MI, + Peripheral Vascular Disease and +CHF + dysrhythmias Atrial Fibrillation and Supra Ventricular Tachycardia Rhythm:Irregular Rate:Normal  ECG AF   Neuro/Psych Anxiety Depression Bipolar Disorder Alzheimer's dementia CVA, Residual Symptoms negative psych ROS   GI/Hepatic negative GI ROS, Neg liver ROS, GERD-  Medicated and Controlled,dysphagia   Endo/Other  Hypothyroidism   Renal/GU Renal diseaseChronic kidney disease  negative genitourinary   Musculoskeletal   Abdominal   Peds  Hematology negative hematology ROS (+)   Anesthesia Other Findings   Reproductive/Obstetrics negative OB ROS                          Anesthesia Physical Anesthesia Plan  ASA: IV  Anesthesia Plan: MAC   Post-op Pain Management:    Induction:   Airway Management Planned: Simple Face Mask  Additional Equipment:   Intra-op Plan:   Post-operative Plan:   Informed Consent: I have reviewed the patients History and Physical, chart, labs and discussed the procedure including the risks, benefits and alternatives for the proposed anesthesia with the patient or authorized representative who has indicated his/her understanding and acceptance.   Dental Advisory Given  Plan Discussed with: CRNA  and Surgeon  Anesthesia Plan Comments:         Anesthesia Quick Evaluation

## 2014-04-19 NOTE — Interval H&P Note (Signed)
History and Physical Interval Note:  04/19/2014 9:05 AM  Jenny [name]  has presented today for surgery, with the diagnosis of mass  The various methods of treatment have been discussed with the patient and family. After consideration of risks, benefits and other options for treatment, the patient has consented to  Procedure(s): COLONOSCOPY (N/A) as a surgical intervention .  The patient's history has been reviewed, patient examined, no change in status, stable for surgery.  I have reviewed the patient's chart and labs.  Questions were answered to the patient's satisfaction.   The recent H&P (dated *04/18/14**) was reviewed, the patient was examined and there is no change in the patients condition since that H&P was completed.   [name]  04/19/2014, 9:05 AM       [name]

## 2014-04-19 NOTE — H&P (View-Only) (Signed)
Subjective  Conference with family concerning disposition, present was husband and oldest daughter   Objective  Cecal mass c/w carcinoma,  Possible lung met, CEA is 48. Pt remains asymptomatic. Dr Radford Pax on standby for surgical clearance. Family favors going ahead with colonoscopy tomorrow, then cardiac cath, and surgical consult. Vital signs in last 24 hours: Temp:  [97.5 F (36.4 C)-98.2 F (36.8 C)] 98.2 F (36.8 C) (06/07 1326) Pulse Rate:  [65-81] 65 (06/07 1326) Resp:  [18-20] 20 (06/07 1326) BP: (109-139)/(53-83) 138/66 mmHg (06/07 1326) SpO2:  [91 %-95 %] 93 % (06/07 1326) Weight:  [145 lb 11.6 oz (66.1 kg)] 145 lb 11.6 oz (66.1 kg) (06/07 0455) Last BM Date: 04/17/14 General:    white female in NAD Heart:  Regular rate and rhythm; no murmurs Lungs: Respirations even and unlabored, lungs CTA bilaterally Abdomen:  Soft, nontender and nondistended. Normal bowel sounds. Extremities:  Without edema. Neurologic:  Alert and oriented,  grossly normal neurologically. Psych:  Anxious   Intake/Output from previous day: 06/06 0701 - 06/07 0700 In: 330 [P.O.:330] Out: 2900 [Urine:2900] Intake/Output this shift: Total I/O In: 240 [P.O.:240] Out: 800 [Urine:800]  Lab Results:  Recent Labs  04/16/14 0351 04/17/14 0416  WBC 13.7* 10.3  HGB 12.7 11.8*  HCT 36.7 35.3*  PLT 442* 457*   BMET  Recent Labs  04/16/14 0351 04/17/14 0416  NA 130* 132*  K 4.4 4.4  CL 91* 94*  CO2 26 26  GLUCOSE 116* 111*  BUN 17 12  CREATININE 1.05 0.87  CALCIUM 8.9 9.3   LFT No results found for this basename: PROT, ALBUMIN, AST, ALT, ALKPHOS, BILITOT, BILIDIR, IBILI,  in the last 72 hours PT/INR No results found for this basename: LABPROT, INR,  in the last 72 hours  Studies/Results: Ct Abdomen Pelvis Wo Contrast  04/17/2014   CLINICAL DATA:  Anemia, evaluate for colon mass  EXAM: CT ABDOMEN AND PELVIS WITHOUT CONTRAST  TECHNIQUE: Multidetector CT imaging of the abdomen and pelvis  was performed following the standard protocol without IV contrast.  COMPARISON:  Partial comparison to CT chest dated 04/14/2014 and 10/31/2011  FINDINGS: Small left pleural effusion, possibly partially loculated. 7 x 11 mm left lower lobe nodule (series 4/image 2).  Cardiomegaly.  Unenhanced liver, spleen, and right adrenal gland are within normal limits.  Pancreas is notable for a coarse calcification in the pancreatic head/uncinate process (series 2/image 31), nonspecific, but likely incidental.  3.5 x 2.3 cm left adrenal mass (series 2/ image 19), unchanged since 2012. While technically indeterminate, stability favors a benign etiology such as an adrenal adenoma.  Layering gallstones (series 2/ image 33), without associated inflammatory changes. No intrahepatic or extrahepatic ductal dilatation.  Scattered bilateral renal cysts, including a 2.4 x 3.4 cm mildly irregular cyst in the left lower pole (series 2/image 34). Additional 13 mm probable hemorrhagic cysts in the left lower pole (series 2/image 37). No renal calculi or hydronephrosis.  No evidence of bowel obstruction. 1.9 x 4.3 x 4.6 cm eccentric cecal mass (series 2/image 55), worrisome for primary chronic neoplasm. Extension into the surrounding pericolonic fat is possible. 5 mm in the common node (series 2/image 48), within normal limits for size. Diverticulosis, without associated inflammatory changes.  Atherosclerotic calcifications of the abdominal aorta and branch vessels.  No abdominopelvic ascites.  Small retroperitoneal lymph nodes which do not meet pathologic CT size criteria.  Uterus and bilateral ovaries are unremarkable.  Bladder is decompressed with indwelling Foley catheter and moderate nondependent  gas.  Degenerative changes of the visualized thoracolumbar spine. Prior bilateral inferior pubic ramus fractures. Right total hip arthroplasty. Mild degenerative changes of the left hip.  IMPRESSION: 4.6 cm eccentric cecal mass, worrisome for  primary colonic neoplasm. Colonoscopy is suggested for further evaluation.  7 x 11 mm left lower lobe nodule, metastasis not excluded.  3.5 cm left adrenal mass, unchanged since 2012, likely reflecting a benign adrenal adenoma.  Small left pleural effusion, possibly partially loculated.  Additional ancillary findings as above.   Electronically Signed   By: Julian Hy M.D.   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 I have ordered a colonoscopy prep for today and tentatively scheduled colonoscopy for tomorrow ( Dr Deatra Ina).   Principal Problem:   Atrial fibrillation with RVR Active Problems:   Bronchitis, chronic   Hypothyroidism   CAD (coronary artery disease)   Bipolar I disorder, most recent episode mixed   GERD (gastroesophageal reflux disease)   Other dysphagia   Congestive heart failure, unspecified   NSTEMI (non-ST elevated myocardial infarction)   Nonspecific abnormal finding in stool contents   Abnormal nuclear cardiac imaging test   Nonspecific (abnormal) findings on radiological and other examination of gastrointestinal tract     LOS: 5 days   [place] Dragon  04/18/2014, 3:05 PM

## 2014-04-19 NOTE — Progress Notes (Signed)
Patient ID: Jenny [name]  female  YQM:[id number]    DOB: 03-08-1927    DOA: 04/13/2014  PCP: Estill Dooms, MD  Assessment/Plan: Principal Problem:  Cecal mass: Newly diagnosed on CT abdomen and pelvis, - CEA 48.0, GI following, patient and family to decide to proceed with colonoscopy, will likely need cecal resection.  - EGD was done as part of anemia workup 6/5 was normal. - C-scope showed large, exophytic, bleeding mass encompassing the ileocecal valve and at least one half the  circumference of the cecum. Biopsy taken. - Cardiology following, will need cardiac cath for preop for cecal resection - Called surgery consult      Atrial fibrillation with RVR: Paroxysmal - Heart rate controlled, currently in normal sinus rhythm, Cardizem drip off - Not on any anticoagulation at this time due to anemia, unknown cause, CHADS 5, GI workup in progress - Echo done outpatient EF 65%. LVef normal, no wma, no valvular abn   -TSH normal,  d-dimer was elevated, CT angiography chest negative for pulmonary embolism  Active Problems:   NSTEMI (non-ST elevated myocardial infarction), acute on chronic diastolic CHF: No chest pain, Troponins trending down. CHF likely precipitated due to rapid A. Fib - Stress Myoview test showed moderate size and intensity, partially reversible inferolateral perfusion defect (SDS 4), consistent with ischemia - Extent 19%. LVEF 54%, normal wall motion - Further management per cardiology, plan for cardiac cath for preop for colon resection    Bronchitis, chronic - Recently treated for pneumonia, continue Xopenex nebs, Mucinex  - Discontinue Ceftin   Hyponatremia -  resolved    Hypothyroidism - TSH 1.69, continue Synthroid  anxiety, depression  Bipolar I disorder, most recent episode mixed - Continue Klonopin per her home regimen, continue Zoloft    GERD (gastroesophageal reflux disease)Continue PPI    Other dysphagia -Currently tolerating regular diet     DVT Prophylaxis: SCDs  Code Status: full code   Family Communication:  Called patient's daughter, unable to [place], will call later today   Disposition:   Consultants:  Cardiology   GI  Procedures:  none   Antibiotics:  Ceftin     Subjective: Patient seen and examined prior to colonoscopy, denied any complaints. Sitter at the bedside.   Objective: Weight change: -0.5 kg (-1 lb 1.6 oz)  Intake/Output Summary (Last 24 hours) at 04/19/14 1201 Last data filed at 04/19/14 0942  Gross per 24 hour  Intake    740 ml  Output   1850 ml  Net  -1110 ml   Blood pressure 169/39, pulse 64, temperature 98.3 F (36.8 C), temperature source Oral, resp. rate 18, height 5\' 4"  (1.626 m), weight 65.6 kg (144 lb 10 oz), SpO2 98.00%.  Physical Exam: General: Alert and awake, oriented x3 NAD CVS: S1-S2 clear, no murmur rubs or gallops Chest: Decreased breath sounds at the bases , no wheezing Abdomen: soft nontender, nondistended, normal bowel sounds  Extremities: no cyanosis, clubbing or edema noted bilaterally  Lab Results: Basic Metabolic Panel:  Recent Labs Lab 04/17/14 0416 04/19/14 0350  NA 132* 136*  K 4.4 4.7  CL 94* 99  CO2 26 21  GLUCOSE 111* 102*  BUN 12 18  CREATININE 0.87 1.17*  CALCIUM 9.3 9.2   Liver Function Tests:  Recent Labs Lab 04/13/14 1519  AST 21  ALT 15  ALKPHOS 92  BILITOT <0.2*  PROT 6.4  ALBUMIN 3.1*   No results found for this basename: LIPASE, AMYLASE,  in the  last 168 hours No results found for this basename: AMMONIA,  in the last 168 hours CBC:  Recent Labs Lab 04/13/14 1519  04/17/14 0416 04/19/14 0350  WBC 7.7  < > 10.3 9.2  NEUTROABS 4.8  --   --   --   HGB 9.2*  < > 11.8* 11.2*  HCT 26.5*  < > 35.3* 33.9*  MCV 81.8  < > 85.3 85.4  PLT 589*  < > 457* 508*  < > = values in this interval not displayed. Cardiac Enzymes:  Recent Labs Lab 04/13/14 1841 04/14/14 0142 04/14/14 0650  TROPONINI 2.18* 1.95* 1.69*    BNP: No components found with this basename: POCBNP,  CBG: No results found for this basename: GLUCAP,  in the last 168 hours   Micro Results: Recent Results (from the past 240 hour(s))  MRSA PCR SCREENING     Status: None   Collection Time    04/13/14  7:57 PM      Result Value Ref Range Status   MRSA by PCR NEGATIVE  NEGATIVE Final   Comment:            The GeneXpert MRSA Assay (FDA     approved for NASAL specimens     only), is one component of a     comprehensive MRSA colonization     surveillance program. It is not     intended to diagnose MRSA     infection nor to guide or     monitor treatment for     MRSA infections.  URINE CULTURE     Status: None   Collection Time    04/14/14  6:33 AM      Result Value Ref Range Status   Specimen Description URINE, CATHETERIZED   Final   Special Requests NONE   Final   Culture  Setup Time     Final   Value: 04/14/2014 11:34     Performed at [facility]     Final   Value: NO GROWTH     Performed at [facility]   Culture     Final   Value: NO GROWTH     Performed at [facility]   Report Status 04/15/2014 FINAL   Final    Studies/Results: Dg Chest 2 View  04/13/2014   CLINICAL DATA:  DEHYDRATION  EXAM: CHEST - 2 VIEW  COMPARISON:  11/15/2013  FINDINGS: Stable cardiomegaly. Improving left lower lung airspace disease, with mild scattered residual airspace opacities. There is some residual linear scarring or subsegmental atelectasis in the left mid lung. Right lung remains clear. Atheromatous aorta. . No effusion. Degenerative changes in bilateral shoulders.  IMPRESSION: 1. Partial improvement in the left lower lung airspace disease. 2. Stable cardiomegaly   Electronically Signed   By: Arne Cleveland M.D.   On: 04/13/2014 15:56   Dg Chest Port 1 View  04/14/2014   CLINICAL DATA:  Shortness of breath  EXAM: PORTABLE CHEST - 1 VIEW  COMPARISON:  04/13/2014  FINDINGS: Mild cardiomegaly which  is stable from yesterday. Mediastinal contours are distorted by leftward rotation. There are streaky lung opacities at the bases, left more than right. No change from prior to suggest pneumonia, edema, effusion, or pneumothorax.  IMPRESSION: Unchanged bibasilar lung opacities, morphology favoring atelectasis.   Electronically Signed   By: Jorje Guild M.D.   On: 04/14/2014 07:08    Medications: Scheduled Meds: . atorvastatin  10 mg Oral q1800  .  benzonatate  100 mg Oral TID  . brimonidine  1 drop Both Eyes Q12H   And  . timolol  1 drop Both Eyes Q12H  . calcitonin (salmon)  1 spray Alternating Nares Daily  . calcium-vitamin D  1 tablet Oral TID  . carvedilol  6.25 mg Oral BID WC  . clonazePAM  0.5 mg Oral QHS  . ferrous sulfate  325 mg Oral BID WC  . furosemide  20 mg Oral BID  . lamoTRIgine  200 mg Oral Daily  . levothyroxine  88 mcg Oral QAC breakfast  . losartan  50 mg Oral Daily  . pantoprazole  40 mg Oral Daily  . pilocarpine  5 mg Oral TID  . potassium chloride SA  20 mEq Oral BID  . saccharomyces boulardii  250 mg Oral BID  . senna  2 tablet Oral QHS  . sertraline  25 mg Oral Daily  . sodium chloride  3 mL Intravenous Q12H      LOS: 6 days   Ripudeep Krystal Eaton M.D. [facility] 04/19/2014, 12:01 PM Pager: [phone]  If 7PM-7AM, please contact night-coverage [email or web address] Password [email or web address]  **Disclaimer: This note was dictated with voice recognition software. Similar sounding words can inadvertently be transcribed and this note may contain transcription errors which may not have been corrected upon publication of note.**

## 2014-04-20 ENCOUNTER — Encounter (HOSPITAL_COMMUNITY)
Admission: EM | Disposition: A | Discharge status: Skilled Nursing Facility | Payer: Medicare Other | Source: Home / Self Care | Attending: Internal Medicine

## 2014-04-20 ENCOUNTER — Encounter (HOSPITAL_COMMUNITY): Payer: Self-pay | Admitting: Gastroenterology

## 2014-04-20 ENCOUNTER — Ambulatory Visit (HOSPITAL_COMMUNITY): Admission: RE | Admit: 2014-04-20 | Payer: Medicare Other | Source: Ambulatory Visit | Admitting: Cardiology

## 2014-04-20 DIAGNOSIS — I214 Non-ST elevation (NSTEMI) myocardial infarction: Secondary | ICD-10-CM

## 2014-04-20 DIAGNOSIS — C189 Malignant neoplasm of colon, unspecified: Secondary | ICD-10-CM

## 2014-04-20 HISTORY — PX: LEFT HEART CATHETERIZATION WITH CORONARY ANGIOGRAM: SHX5451

## 2014-04-20 HISTORY — DX: Malignant neoplasm of colon, unspecified: C18.9

## 2014-04-20 LAB — PROTIME-INR
INR: 1.06 (ref 0.00–1.49)
Prothrombin Time: 13.6 seconds (ref 11.6–15.2)

## 2014-04-20 LAB — OCCULT BLOOD X 1 CARD TO LAB, STOOL: Fecal Occult Bld: POSITIVE — AB

## 2014-04-20 SURGERY — LEFT HEART CATHETERIZATION WITH CORONARY ANGIOGRAM
Anesthesia: LOCAL

## 2014-04-20 MED ORDER — SODIUM CHLORIDE 0.9 % IV SOLN: INTRAVENOUS | Status: AC

## 2014-04-20 MED ORDER — SODIUM CHLORIDE 0.9 % IJ SOLN: 3.0000 mL | INTRAMUSCULAR | Status: DC | PRN

## 2014-04-20 MED ORDER — SODIUM CHLORIDE 0.9 % IV SOLN: INTRAVENOUS | Status: DC

## 2014-04-20 MED ORDER — SODIUM CHLORIDE 0.9 % IJ SOLN: 3.0000 mL | Freq: Two times a day (BID) | INTRAMUSCULAR | Status: DC

## 2014-04-20 MED ORDER — CLONAZEPAM 0.5 MG PO TABS
0.2500 mg | ORAL_TABLET | ORAL | Status: DC | PRN
  Administered 2014-04-20 – 2014-04-26 (×7): 0.25 mg via ORAL
  Filled 2014-04-20 (×9): qty 1

## 2014-04-20 MED ORDER — WHITE PETROLATUM GEL
Status: AC
  Filled 2014-04-20: qty 5

## 2014-04-20 MED ORDER — SODIUM CHLORIDE 0.9 % IV SOLN: 250.0000 mL | INTRAVENOUS | Status: DC | PRN

## 2014-04-20 NOTE — CV Procedure (Signed)
   Cardiac Catheterization Procedure Note  Name: Jenny [name] MRN: [id number] DOB: [birth year]  Procedure: Left Heart Cath, Selective Coronary Angiography  Indication: NSTEMI in setting of atrial fib with RVR, abnormal Myoview scan.  Procedural details: The right groin was prepped, draped, and anesthetized with 1% lidocaine. Using modified Seldinger technique, a 5 French sheath was introduced into the right femoral artery. Standard Judkins catheters were used for coronary angiography and left ventriculography. Catheter exchanges were performed over a guidewire. There were no immediate procedural complications. The patient was transferred to the post catheterization recovery area for further monitoring.  Procedural Findings: Hemodynamics:  AO 190/60 LV 193/14   Coronary angiography: Coronary dominance: right  Left mainstem: The left main is patent. It divides into the LAD, intermediate branch, and left circumflex.  Left anterior descending (LAD): The LAD is moderately calcified. There is no obstructive disease throughout the course of the LAD as it reaches the left ventricular apex. The diagonal branches are patent without obstructive disease.  Left circumflex (LCx): The left circumflex supplies a small intermediate branch. The first obtuse marginal is tortuous but widely patent. The AV circumflex is patent without obstructive disease.  Right coronary artery (RCA): This is a heavily calcified vessel. The proximal, mid, and distal vessel are widely patent with minor irregularity. The PDA and PLA branches are patent.  Left ventriculography: Deferred  Plane fluoroscopy demonstrates heavy calcification of the coronary arteries, mitral annulus, and ascending aorta.  Final Conclusions:   1. Heavily calcified but widely patent coronary arteries with no obstructive CAD identified.  2. Hypertension with normal left ventricular end-diastolic pressure  Recommendations: The patient does not  have significant coronary obstructive disease.  Discussed with Dr Tana Coast. The patient cannot go back to [facility] because of time of day and arterial sheath management. She will transfer to [facility] team 10 here tonight then return to [facility].   Sherren Mocha 04/20/2014, 6:00 PM

## 2014-04-20 NOTE — Progress Notes (Signed)
Notes reviewed. Discussed with Dr Tana Coast yesterday. Pt stable overnight without chest pain. Reviewed plans for cath this afternoon. Explained risks and potential benefits. Will give clears this am and plan cath this PM. Plan diagnostic study only unless critical anatomy considering imminent need for noncardiac surgery.  Sherren Mocha, MD 04/20/2014 6:26 AM

## 2014-04-20 NOTE — Progress Notes (Addendum)
RN called cath lab after pt was taken out of system at [facility] to check on pt status, at that time RN was notified pt would not be returning. RN placed patient belongings in [facility] room cabinet and notified family. J.Ryian Lynde, RN

## 2014-04-20 NOTE — Progress Notes (Signed)
    Progress Note   Subjective  No complaints.    Objective   Vital signs in last 24 hours: Temp:  [97.9 F (36.6 C)-98.5 F (36.9 C)] 97.9 F (36.6 C) (06/09 0537) Pulse Rate:  [62-79] 69 (06/09 0537) Resp:  [16-27] 16 (06/09 0537) BP: (124-169)/(36-53) 124/53 mmHg (06/09 0537) SpO2:  [93 %-100 %] 93 % (06/09 0537) Weight:  [143 lb 8.3 oz (65.1 kg)] 143 lb 8.3 oz (65.1 kg) (06/09 0537) Last BM Date: 04/17/14 General:    Pleasant white female in NAD Abdomen:  Soft, nontender and nondistended. Normal bowel sounds. Extremities:  Without edema. Neurologic:  Alert and oriented,  grossly normal neurologically. Psych:  Cooperative. Normal mood and affect.  Lab Results:  Recent Labs  04/19/14 0350  WBC 9.2  HGB 11.2*  HCT 33.9*  PLT 508*   BMET  Recent Labs  04/19/14 0350  NA 136*  K 4.7  CL 99  CO2 21  GLUCOSE 102*  BUN 18  CREATININE 1.17*  CALCIUM 9.2     Assessment / Plan:   80 year old female with non-obstructing cecal mass. Biopsies pending but likely cancer.Surgery following. Patient For cardiac cath later today (in preparation for eventual bowel resection).  On clear liquid diet at present   LOS: 7 days   Willia Craze  04/20/2014, 9:15 AM  GI Attending Note  I have personally taken an interval history, reviewed the chart, and examined the patient.  The quality demonstrates changes consistent with an adenocarcinoma.  If it will be several days before she has surgery I would place her on a low fiber diet.  Signing off.  Sandy Salaam. Deatra Ina, MD, [facility] [phone]

## 2014-04-20 NOTE — Progress Notes (Signed)
1 Day Post-Op  Subjective: No abdominal pain or nausea.  Objective: Vital signs in last 24 hours: Temp:  [97.9 F (36.6 C)-98.9 F (37.2 C)] 97.9 F (36.6 C) (06/09 0537) Pulse Rate:  [62-79] 69 (06/09 0537) Resp:  [13-27] 16 (06/09 0537) BP: (124-169)/(36-53) 124/53 mmHg (06/09 0537) SpO2:  [93 %-100 %] 93 % (06/09 0537) Weight:  [143 lb 8.3 oz (65.1 kg)] 143 lb 8.3 oz (65.1 kg) (06/09 0537) Last BM Date: 04/17/14  Intake/Output from previous day: 06/08 0701 - 06/09 0700 In: 740 [P.O.:240; I.V.:500] Out: 2025 [Urine:2025] Intake/Output this shift:    PE: General- In NAD Abdomen-soft, nontender  Lab Results:   Recent Labs  04/19/14 0350  WBC 9.2  HGB 11.2*  HCT 33.9*  PLT 508*   BMET  Recent Labs  04/19/14 0350  NA 136*  K 4.7  CL 99  CO2 21  GLUCOSE 102*  BUN 18  CREATININE 1.17*  CALCIUM 9.2   PT/INR  Recent Labs  04/20/14 0725  LABPROT 13.6  INR 1.06   Comprehensive Metabolic Panel:    Component Value Date/Time   NA 136* 04/19/2014 0350   NA 132* 04/17/2014 0416   NA 117* 04/13/2014   NA 124* 04/08/2014   K 4.7 04/19/2014 0350   K 4.4 04/17/2014 0416   CL 99 04/19/2014 0350   CL 94* 04/17/2014 0416   CO2 21 04/19/2014 0350   CO2 26 04/17/2014 0416   BUN 18 04/19/2014 0350   BUN 12 04/17/2014 0416   BUN 12 04/13/2014   BUN 15 04/08/2014   CREATININE 1.17* 04/19/2014 0350   CREATININE 0.87 04/17/2014 0416   CREATININE 1.0 04/13/2014   CREATININE 1.1 04/08/2014   GLUCOSE 102* 04/19/2014 0350   GLUCOSE 111* 04/17/2014 0416   CALCIUM 9.2 04/19/2014 0350   CALCIUM 9.3 04/17/2014 0416   AST 21 04/13/2014 1519   AST 15 02/02/2014   ALT 15 04/13/2014 1519   ALT 9 02/02/2014   ALKPHOS 92 04/13/2014 1519   ALKPHOS 69 02/02/2014   BILITOT <0.2* 04/13/2014 1519   BILITOT <0.2* 11/16/2013 0502   PROT 6.4 04/13/2014 1519   PROT 6.0 11/16/2013 0502   ALBUMIN 3.1* 04/13/2014 1519   ALBUMIN 2.3* 11/20/2013 0534     Studies/Results: No results found.  Anti-infectives: Anti-infectives   Start     Dose/Rate Route Frequency Ordered Stop   04/13/14 2000  cefUROXime (CEFTIN) tablet 500 mg  Status:  Discontinued     500 mg Oral 2 times daily with meals 04/13/14 1840 04/19/14 0951      Assessment  Cecal tumor-biopsies pending  NSTEMI-cath today  Afib with RVR  Multiple other medical problems.    LOS: 7 days   Plan: Await results of cardiac catherization.  Doubt she will obstruct anytime soon so we have some time if she needs it to recover from the acute cardiac event.   Rhunette Croft Shayleen Eppinger 04/20/2014

## 2014-04-20 NOTE — Progress Notes (Signed)
Patient ID: Jenny [name]  female  JME:[id number]    DOB: [birth year]    DOA: 04/13/2014  PCP: Estill Dooms, MD  hospitalization course Pt is 80 yo female with multiple and complex medical problems including HTN, HLD, dementia, CHF, CAD, hypothyroidism, who presented to [facility] ED with main concern of several days duration of progressively worsening fatigue, lethargy, shortness of breath. In ED, pt noted to be in atrial fibrillation with HR in 150's, initial troponin elevated at 1.76. Na 122.  Patient was admitted to stepdown unit, placed on Cardizem drip. Cardiology was consulted, patient was also noted to have NSTEMI with elevated troponin. Cardiology recommended nuclear medicine stress test, 6/5 showed moderate sized, partially reversible inferolateral perfusion defect, consistent with ischemia, EF 54%. Patient however also developed anemia during the hospitalization, cardiology recommended anemia workup prior to cardiac cath. EGD was normal. CT abdomen and pelvis revealed cecal mass, patient underwent coloscopy on 6/8 showed a large exophytic bleeding mass encompassing the ileocecal valve. General surgery was also consulted. Patient is undergoing cardiac cath today as a preop for cecal resection.    Assessment/Plan: Principal Problem:  Colon cancer/Cecal mass: Newly diagnosed on CT abdomen and pelvis, adeno carcinoma by biopsy - CEA 48.0, GI following, patient and family to decide to proceed with colonoscopy, will likely need cecal resection.  - EGD was done as part of anemia workup 6/5 was normal. - C-scope showed large, exophytic, bleeding mass encompassing the ileocecal valve and at least one half the  circumference of the cecum. Biopsy positive for adenocarcinoma - Cardiology following,cardiac cath today for preop for cecal resection, surgery consulted    Atrial fibrillation with RVR: Paroxysmal - Heart rate controlled, currently in normal sinus rhythm, Cardizem drip off - Not on any  anticoagulation at this time due to anemia, newly diagnosed colon cancer - Echo done outpatient EF 65%. LVef normal, no wma, no valvular abn   -TSH normal,  d-dimer was elevated, CT angiography chest negative for pulmonary embolism  Active Problems:   NSTEMI (non-ST elevated myocardial infarction), acute on chronic diastolic CHF: No chest pain, Troponins trending down. CHF likely precipitated due to rapid A. Fib - Stress Myoview test showed moderate size and intensity, partially reversible inferolateral perfusion defect (SDS 4), consistent with ischemia - Extent 19%. LVEF 54%, normal wall motion - Further management per cardiology, plan for cardiac cath today    Bronchitis, chronic - Recently treated for pneumonia, continue Xopenex nebs, Mucinex  - Discontinue Ceftin   Hyponatremia -  resolved    Hypothyroidism - TSH 1.69, continue Synthroid  anxiety, depression  Bipolar I disorder, most recent episode mixed - Continue Klonopin per her home regimen, continue Zoloft    GERD (gastroesophageal reflux disease)Continue PPI    Other dysphagia - Stable   DVT Prophylaxis: SCDs  Code Status: full code   Family Communication:   Discussed in detail with patient's daughter, Izora Gala yesterday at the bedside  Disposition:   Consultants:  Cardiology   GI  General surgery  Procedures:  none   Antibiotics:  Ceftin     Subjective: Patient seen and examined , no acute issues   Objective: Weight change: -0.5 kg (-1 lb 1.6 oz)  Intake/Output Summary (Last 24 hours) at 04/20/14 1105 Last data filed at 04/20/14 0540  Gross per 24 hour  Intake    240 ml  Output   2025 ml  Net  -1785 ml   Blood pressure 124/53, pulse 69, temperature 97.9 F (36.6  C), temperature source Oral, resp. rate 16, height 5\' 4"  (1.626 m), weight 65.1 kg (143 lb 8.3 oz), SpO2 93.00%.  Physical Exam: General: Alert and awake, oriented x3 NAD CVS: S1-S2 clear, no murmur rubs or gallops Chest:  Decreased breath sounds at the bases , no wheezing Abdomen: soft nontender, nondistended, normal bowel sounds  Extremities: no cyanosis, clubbing or edema noted bilaterally  Lab Results: Basic Metabolic Panel:  Recent Labs Lab 04/17/14 0416 04/19/14 0350  NA 132* 136*  K 4.4 4.7  CL 94* 99  CO2 26 21  GLUCOSE 111* 102*  BUN 12 18  CREATININE 0.87 1.17*  CALCIUM 9.3 9.2   Liver Function Tests:  Recent Labs Lab 04/13/14 1519  AST 21  ALT 15  ALKPHOS 92  BILITOT <0.2*  PROT 6.4  ALBUMIN 3.1*   No results found for this basename: LIPASE, AMYLASE,  in the last 168 hours No results found for this basename: AMMONIA,  in the last 168 hours CBC:  Recent Labs Lab 04/13/14 1519  04/17/14 0416 04/19/14 0350  WBC 7.7  < > 10.3 9.2  NEUTROABS 4.8  --   --   --   HGB 9.2*  < > 11.8* 11.2*  HCT 26.5*  < > 35.3* 33.9*  MCV 81.8  < > 85.3 85.4  PLT 589*  < > 457* 508*  < > = values in this interval not displayed. Cardiac Enzymes:  Recent Labs Lab 04/13/14 1841 04/14/14 0142 04/14/14 0650  TROPONINI 2.18* 1.95* 1.69*   BNP: No components found with this basename: POCBNP,  CBG: No results found for this basename: GLUCAP,  in the last 168 hours   Micro Results: Recent Results (from the past 240 hour(s))  MRSA PCR SCREENING     Status: None   Collection Time    04/13/14  7:57 PM      Result Value Ref Range Status   MRSA by PCR NEGATIVE  NEGATIVE Final   Comment:            The GeneXpert MRSA Assay (FDA     approved for NASAL specimens     only), is one component of a     comprehensive MRSA colonization     surveillance program. It is not     intended to diagnose MRSA     infection nor to guide or     monitor treatment for     MRSA infections.  URINE CULTURE     Status: None   Collection Time    04/14/14  6:33 AM      Result Value Ref Range Status   Specimen Description URINE, CATHETERIZED   Final   Special Requests NONE   Final   Culture  Setup Time      Final   Value: 04/14/2014 11:34     Performed at [facility]     Final   Value: NO GROWTH     Performed at [facility]   Culture     Final   Value: NO GROWTH     Performed at [facility]   Report Status 04/15/2014 FINAL   Final    Studies/Results: Dg Chest 2 View  04/13/2014   CLINICAL DATA:  DEHYDRATION  EXAM: CHEST - 2 VIEW  COMPARISON:  11/15/2013  FINDINGS: Stable cardiomegaly. Improving left lower lung airspace disease, with mild scattered residual airspace opacities. There is some residual linear scarring or subsegmental atelectasis in the  left mid lung. Right lung remains clear. Atheromatous aorta. . No effusion. Degenerative changes in bilateral shoulders.  IMPRESSION: 1. Partial improvement in the left lower lung airspace disease. 2. Stable cardiomegaly   Electronically Signed   By: Arne Cleveland M.D.   On: 04/13/2014 15:56   Dg Chest Port 1 View  04/14/2014   CLINICAL DATA:  Shortness of breath  EXAM: PORTABLE CHEST - 1 VIEW  COMPARISON:  04/13/2014  FINDINGS: Mild cardiomegaly which is stable from yesterday. Mediastinal contours are distorted by leftward rotation. There are streaky lung opacities at the bases, left more than right. No change from prior to suggest pneumonia, edema, effusion, or pneumothorax.  IMPRESSION: Unchanged bibasilar lung opacities, morphology favoring atelectasis.   Electronically Signed   By: Jorje Guild M.D.   On: 04/14/2014 07:08    Medications: Scheduled Meds: . atorvastatin  10 mg Oral q1800  . benzonatate  100 mg Oral TID  . brimonidine  1 drop Both Eyes Q12H   And  . timolol  1 drop Both Eyes Q12H  . calcitonin (salmon)  1 spray Alternating Nares Daily  . calcium-vitamin D  1 tablet Oral TID  . carvedilol  6.25 mg Oral BID WC  . clonazePAM  0.5 mg Oral QHS  . ferrous sulfate  325 mg Oral BID WC  . furosemide  20 mg Oral BID  . lamoTRIgine  200 mg Oral Daily  . levothyroxine  88 mcg Oral QAC  breakfast  . losartan  50 mg Oral Daily  . pantoprazole  40 mg Oral Daily  . pilocarpine  5 mg Oral TID  . potassium chloride SA  20 mEq Oral BID  . saccharomyces boulardii  250 mg Oral BID  . senna  2 tablet Oral QHS  . sertraline  25 mg Oral Daily  . sodium chloride  3 mL Intravenous Q12H  . sodium chloride  3 mL Intravenous Q12H      LOS: 7 days   Ripudeep Krystal Eaton M.D. [facility] 04/20/2014, 11:05 AM Pager: [phone]  If 7PM-7AM, please contact night-coverage [email or web address] Password [email or web address]  **Disclaimer: This note was dictated with voice recognition software. Similar sounding words can inadvertently be transcribed and this note may contain transcription errors which may not have been corrected upon publication of note.**

## 2014-04-20 NOTE — Plan of Care (Signed)
Discussed with Dr. Sherren Mocha, patient had a normal cardiac cath, she has a femoral sheath and needs to be observed overnight and [facility]. Patient will be in [facility], discussed with Dr. Ernestina Patches for sign out. Patient will be carelinked back to [facility]. Patient will be transferred to [facility] team 10, [facility] until she is back to [facility] tomorrow.   Ripudeep Krystal Eaton M.D. [facility] 04/20/2014, 6:14 PM  Pager: [phone]

## 2014-04-21 ENCOUNTER — Other Ambulatory Visit: Payer: Self-pay

## 2014-04-21 DIAGNOSIS — J189 Pneumonia, unspecified organism: Secondary | ICD-10-CM | POA: Diagnosis not present

## 2014-04-21 DIAGNOSIS — I5033 Acute on chronic diastolic (congestive) heart failure: Secondary | ICD-10-CM | POA: Diagnosis not present

## 2014-04-21 DIAGNOSIS — I214 Non-ST elevation (NSTEMI) myocardial infarction: Secondary | ICD-10-CM | POA: Diagnosis not present

## 2014-04-21 DIAGNOSIS — I472 Ventricular tachycardia: Secondary | ICD-10-CM | POA: Diagnosis not present

## 2014-04-21 DIAGNOSIS — I4729 Other ventricular tachycardia: Secondary | ICD-10-CM | POA: Diagnosis not present

## 2014-04-21 LAB — BASIC METABOLIC PANEL
BUN: 12 mg/dL (ref 6–23)
CO2: 20 mEq/L (ref 19–32)
CREATININE: 0.98 mg/dL (ref 0.50–1.10)
Calcium: 9.1 mg/dL (ref 8.4–10.5)
Chloride: 93 mEq/L — ABNORMAL LOW (ref 96–112)
GFR, EST AFRICAN AMERICAN: 60 mL/min — AB (ref >90)
GFR, EST NON AFRICAN AMERICAN: 52 mL/min — AB (ref >90)
Glucose, Bld: 131 mg/dL — ABNORMAL HIGH (ref 70–99)
Potassium: 5.3 mEq/L (ref 3.7–5.3)
Sodium: 128 mEq/L — ABNORMAL LOW (ref 137–147)

## 2014-04-21 LAB — MAGNESIUM: Magnesium: 1.7 mg/dL (ref 1.5–2.5)

## 2014-04-21 MED ORDER — METOPROLOL TARTRATE 25 MG PO TABS: 25.0000 mg | ORAL_TABLET | Freq: Four times a day (QID) | ORAL | Status: DC

## 2014-04-21 MED ORDER — SODIUM CHLORIDE 0.9 % IV SOLN: INTRAVENOUS | Status: DC

## 2014-04-21 MED ORDER — METOPROLOL TARTRATE 25 MG PO TABS
25.0000 mg | ORAL_TABLET | Freq: Three times a day (TID) | ORAL | Status: DC
  Administered 2014-04-21 – 2014-04-26 (×12): 25 mg via ORAL
  Filled 2014-04-21 (×18): qty 1

## 2014-04-21 MED ORDER — DILTIAZEM HCL 100 MG IV SOLR
5.0000 mg/h | INTRAVENOUS | Status: DC
  Administered 2014-04-21: 06:00:00 5 mg/h via INTRAVENOUS
  Filled 2014-04-21 (×2): qty 100

## 2014-04-21 MED ORDER — DEXTROSE 5 % IV SOLN: 5.0000 mg/h | INTRAVENOUS | Status: DC

## 2014-04-21 MED ORDER — DILTIAZEM LOAD VIA INFUSION
10.0000 mg | Freq: Once | INTRAVENOUS | Status: AC
  Administered 2014-04-21: 06:00:00 10 mg via INTRAVENOUS

## 2014-04-21 MED ORDER — BENZONATATE 100 MG PO CAPS
100.0000 mg | ORAL_CAPSULE | Freq: Three times a day (TID) | ORAL | Status: DC | PRN
  Filled 2014-04-21: qty 1

## 2014-04-21 MED ORDER — MORPHINE SULFATE 2 MG/ML IJ SOLN: 1.0000 mg | INTRAMUSCULAR | Status: DC | PRN

## 2014-04-21 MED ORDER — ACETAMINOPHEN 325 MG PO TABS
325.0000 mg | ORAL_TABLET | ORAL | Status: DC | PRN
  Administered 2014-04-21 – 2014-04-22 (×2): 325 mg via ORAL
  Filled 2014-04-21 (×2): qty 1

## 2014-04-21 MED ORDER — METOPROLOL TARTRATE 1 MG/ML IV SOLN
5.0000 mg | INTRAVENOUS | Status: AC
  Administered 2014-04-21: 5 mg via INTRAVENOUS
  Filled 2014-04-21: qty 5

## 2014-04-21 MED FILL — Fentanyl Citrate Inj 0.05 MG/ML: INTRAMUSCULAR | Qty: 2 | Status: AC

## 2014-04-21 MED FILL — Midazolam HCl Inj 2 MG/2ML (Base Equivalent): INTRAMUSCULAR | Qty: 2 | Status: AC

## 2014-04-21 MED FILL — Heparin Sodium (Porcine) 2 Unit/ML in Sodium Chloride 0.9%: INTRAMUSCULAR | Qty: 1500 | Status: AC

## 2014-04-21 MED FILL — Lidocaine HCl Local Preservative Free (PF) Inj 1%: INTRAMUSCULAR | Qty: 30 | Status: AC

## 2014-04-21 NOTE — Progress Notes (Addendum)
Patient: Jenny [name] / Admit Date: 04/13/2014 / Date of Encounter: 04/21/2014, 7:17 AM  Subjective  Denies acute complaints. "They say I am at [facility] Her longtime sitter Aram Beecham is at bedside and affirms that she has moderate dementia, some days worse than others.  Objective   Telemetry: Went into rapid AF again overnight, 120s-160s, also with intermittent NSVT with 3-8 beat runs  Physical Exam: Blood pressure 165/146, pulse 122, temperature 98.1 F (36.7 C), temperature source Axillary, resp. rate 16, height 5\' 4"  (1.626 m), weight 145 lb 4.5 oz (65.9 kg), SpO2 93.00%. General: Well developed frail appearing WF in no acute distress. Head: Normocephalic, atraumatic, sclera non-icteric, no xanthomas, nares are without discharge. Neck: Negative for carotid bruits. JVP not elevated. Lungs: Clear bilaterally to auscultation without wheezes, rales, or rhonchi. Breathing is unlabored. Heart: Irregularly irregular, S1 S2, 2/6 SEM at LLSB and apex, No rubs or gallops.  Abdomen: Soft, non-tender, non-distended with normoactive bowel sounds. No rebound/guarding. Extremities: No clubbing or cyanosis. No edema. Distal pedal pulses are 2+ and equal bilaterally. L groin site without ecchymosis, bruit or hematoma Neuro: Alert and oriented X 3. Moves all extremities spontaneously. Psych:  Responds to questions appropriately with a normal affect.   Intake/Output Summary (Last 24 hours) at 04/21/14 0717 Last data filed at 04/21/14 0600  Gross per 24 hour  Intake    680 ml  Output   2075 ml  Net  -1395 ml    Inpatient Medications:  . atorvastatin  10 mg Oral q1800  . benzonatate  100 mg Oral TID  . brimonidine  1 drop Both Eyes Q12H   And  . timolol  1 drop Both Eyes Q12H  . calcitonin (salmon)  1 spray Alternating Nares Daily  . calcium-vitamin D  1 tablet Oral TID  . carvedilol  6.25 mg Oral BID WC  . clonazePAM  0.5 mg Oral QHS  . ferrous sulfate  325 mg Oral BID WC  . furosemide  20  mg Oral BID  . lamoTRIgine  200 mg Oral Daily  . levothyroxine  88 mcg Oral QAC breakfast  . losartan  50 mg Oral Daily  . pantoprazole  40 mg Oral Daily  . pilocarpine  5 mg Oral TID  . potassium chloride SA  20 mEq Oral BID  . saccharomyces boulardii  250 mg Oral BID  . senna  2 tablet Oral QHS  . sertraline  25 mg Oral Daily  . white petrolatum       Infusions:  . diltiazem (CARDIZEM) infusion 10 mg/hr (04/21/14 0630)  . lactated ringers Stopped (04/20/14 0700)    Labs:  Recent Labs  04/19/14 0350  NA 136*  K 4.7  CL 99  CO2 21  GLUCOSE 102*  BUN 18  CREATININE 1.17*  CALCIUM 9.2    Recent Labs  04/19/14 0350  WBC 9.2  HGB 11.2*  HCT 33.9*  MCV 85.4  PLT 508*      Radiology/Studies:  Ct Abdomen Pelvis Wo Contrast  04/17/2014   CLINICAL DATA:  Anemia, evaluate for colon mass  EXAM: CT ABDOMEN AND PELVIS WITHOUT CONTRAST  TECHNIQUE: Multidetector CT imaging of the abdomen and pelvis was performed following the standard protocol without IV contrast.  COMPARISON:  Partial comparison to CT chest dated 04/14/2014 and 10/31/2011  FINDINGS: Small left pleural effusion, possibly partially loculated. 7 x 11 mm left lower lobe nodule (series 4/image 2).  Cardiomegaly.  Unenhanced liver, spleen, and right adrenal gland  are within normal limits.  Pancreas is notable for a coarse calcification in the pancreatic head/uncinate process (series 2/image 31), nonspecific, but likely incidental.  3.5 x 2.3 cm left adrenal mass (series 2/ image 19), unchanged since 2012. While technically indeterminate, stability favors a benign etiology such as an adrenal adenoma.  Layering gallstones (series 2/ image 33), without associated inflammatory changes. No intrahepatic or extrahepatic ductal dilatation.  Scattered bilateral renal cysts, including a 2.4 x 3.4 cm mildly irregular cyst in the left lower pole (series 2/image 34). Additional 13 mm probable hemorrhagic cysts in the left lower pole  (series 2/image 37). No renal calculi or hydronephrosis.  No evidence of bowel obstruction. 1.9 x 4.3 x 4.6 cm eccentric cecal mass (series 2/image 55), worrisome for primary chronic neoplasm. Extension into the surrounding pericolonic fat is possible. 5 mm in the common node (series 2/image 48), within normal limits for size. Diverticulosis, without associated inflammatory changes.  Atherosclerotic calcifications of the abdominal aorta and branch vessels.  No abdominopelvic ascites.  Small retroperitoneal lymph nodes which do not meet pathologic CT size criteria.  Uterus and bilateral ovaries are unremarkable.  Bladder is decompressed with indwelling Foley catheter and moderate nondependent gas.  Degenerative changes of the visualized thoracolumbar spine. Prior bilateral inferior pubic ramus fractures. Right total hip arthroplasty. Mild degenerative changes of the left hip.  IMPRESSION: 4.6 cm eccentric cecal mass, worrisome for primary colonic neoplasm. Colonoscopy is suggested for further evaluation.  7 x 11 mm left lower lobe nodule, metastasis not excluded.  3.5 cm left adrenal mass, unchanged since 2012, likely reflecting a benign adrenal adenoma.  Small left pleural effusion, possibly partially loculated.  Additional ancillary findings as above.   Electronically Signed   By: Julian Hy M.D.   On: 04/17/2014 09:58   Dg Chest 2 View  04/13/2014   CLINICAL DATA:  DEHYDRATION  EXAM: CHEST - 2 VIEW  COMPARISON:  11/15/2013  FINDINGS: Stable cardiomegaly. Improving left lower lung airspace disease, with mild scattered residual airspace opacities. There is some residual linear scarring or subsegmental atelectasis in the left mid lung. Right lung remains clear. Atheromatous aorta. . No effusion. Degenerative changes in bilateral shoulders.  IMPRESSION: 1. Partial improvement in the left lower lung airspace disease. 2. Stable cardiomegaly   Electronically Signed   By: Arne Cleveland M.D.   On: 04/13/2014  15:56   Ct Angio Chest Pe W/cm &/or Wo Cm  04/14/2014   CLINICAL DATA:  Acute respiratory failure with shortness of breath. Evaluate for pulmonary embolism.  EXAM: CT ANGIOGRAPHY CHEST WITH CONTRAST  TECHNIQUE: Multidetector CT imaging of the chest was performed using the standard protocol during bolus administration of intravenous contrast. Multiplanar CT image reconstructions and MIPs were obtained to evaluate the vascular anatomy.  CONTRAST:  160mL OMNIPAQUE IOHEXOL 350 MG/ML SOLN  COMPARISON:  10/31/2011  FINDINGS: THORACIC INLET/BODY WALL:  No acute abnormality.  MEDIASTINUM:  Mild cardiomegaly. No pericardial effusion. Diffuse atherosclerotic calcification, including the coronary arteries. There is intermittent respiratory motion, which limits sensitivity for detecting pulmonary embolus at the affected levels. Exam is overall diagnostic, and there is no pulmonary embolism identified. No aortic aneurysm or dissection. No lymphadenopathy. Negative esophagus.  LUNG WINDOWS:  There is a small, layering left pleural effusion. The fluid is water density. There is mild associated atelectasis. Although limited by motion artifact, there is a nodule in the upper lingula which measures 7 mm. 13 nodular density in the posterior basilar segment left lower lobe, subpleural. In  the medial basilar segment left lower lobe on image 52, there is a 12 mm nodule. Centrilobular emphysema.  UPPER ABDOMEN:  A soft tissue density nodule arising from the left adrenal gland is stable in size, up to 3.5 cm. Stability is consistent with benign lesion, most likely lipid poor adenoma.  OSSEOUS:  No acute fracture.  No suspicious lytic or blastic lesions.  Review of the MIP images confirms the above findings.  IMPRESSION: 1. Negative for pulmonary embolism. 2. Small left pleural effusion. 3. Three left lung nodules, measuring up to 13 mm. Bronchogenic carcinoma is a diagnostic possibility, recommend follow-up chest CT in 3 months. 4.  Emphysema.   Electronically Signed   By: Jorje Guild M.D.   On: 04/14/2014 21:12   Nm Myocar Single W/spect W/wall Motion And Ef  04/16/2014   HISTORY OF PRESENT ILLNESS: Nuclear Med Background  Indication for Stress Test:  NSTEMI, dyspnea  History: 80 yo female with multiple and complex medical problems including HTN, HLD, dementia, CHF, CAD- I see no record for CAD., hypothyroidism, who presented to [facility] ED with main concern of several days duration of progressively worsening fatigue, lethargy, shortness of breath. Please note that pt is unable to provide details and daughter at bedside was primary historian. Pt apparently treated for PNA in Jan 2015 and again last week and is still on ABX but per daughter, pt has been moor sleepy and not eating much. No reported chest pain or abdominal pain, no urinary concerns. Pt apparently has chronic dyspnea but slightly worse over the past several days. In ED, pt noted to be in atrial fibrillation with HR in 150's, initial troponin elevated at 1.76. Na 122. Please note that pt has been denying chest pain since arrival to the ED. Cardiology has been asked to see.  Cardiac Risk Factors:  HTN, HPL, CHF, CAD  Symptoms:  Dyspnea, fatigue, NSTEMI  PROCEDURE: Nuclear Pre-Procedure  Caffeine/Decaff Intake:  NPO After: Midnight.  Lungs:  O2 Stat:  IV 0.9% NS with Angio Cath:  Chest Size (in):  Cup Size:  Height: 5'4"  Weight:  144 lbs  BMI:  Tech Comments:  Nuclear Med Study  1 or 2 day study: 1  Stress Test Type: Lexiscan  Reading MD: Hilty  Order Authorizing Provider: Brackbill  Resting Radionuclide: Sestamibi  Resting Radionuclide Dose:  10 mCi  Stress Radionuclide: Sestamibi  Stress Radionuclide Dose:  30 mCi  Stress Protocol  Rest HR: 90  Stress HR: 75  Rest BP: 195/67  Stress BP: 181/68  Exercise Time (min):  METS:  Dose of Adenosine (mg):  Dose of Lexiscan:  0.4 mg  Dose of Atropine (mg):  Dose of Dobutamine:  Stress Test Technologist:  Nuclear Technologist:  Rest  Procedure:  Stress Procedure:  Transient Ischemic Dilatation (Normal <1.22): 0.86  Lung/Heart Ratio (Normal <0.45): 0.47  QGS EDV:  47 ml  QGS ESV:  21 ml  LV Ejection Fraction: 54%  Rest ECG: Normal sinus rhythm  Stress ECG:  No lexiscan EKG changes  Raw Data Images:  No significant extracardiac activity  Stress Images:  Moderate sized and intensity inferolateral defect  Rest Images:  Mild inferolateral defect  Subtraction (SDS): 4  IMPRESSION: Exercise Capacity: N/A  BP Response: Normal  Clinical Symptoms: None  ECG Impression:  No lexiscan EKG changes  Comparison with Prior Nuclear Study: None  Final Impression:  Moderate size and intensity, partially reversible inferolateral perfusion defect (SDS 4), consistent with ischemia - Extent 19%. LVEF 54%,  normal wall motion.   Electronically Signed   By: Pixie Casino   On: 04/16/2014 14:20   Dg Chest Port 1 View  04/14/2014   CLINICAL DATA:  Shortness of breath  EXAM: PORTABLE CHEST - 1 VIEW  COMPARISON:  04/13/2014  FINDINGS: Mild cardiomegaly which is stable from yesterday. Mediastinal contours are distorted by leftward rotation. There are streaky lung opacities at the bases, left more than right. No change from prior to suggest pneumonia, edema, effusion, or pneumothorax.  IMPRESSION: Unchanged bibasilar lung opacities, morphology favoring atelectasis.   Electronically Signed   By: Jorje Guild M.D.   On: 04/14/2014 07:08     Assessment and Plan  1. Anemia likely due to GI bleed s/p 2 U PRBC 6/4  2. Newly diagnosed cecal mass consistent with carcinoma, likely causing GIB, and pulmonary nodule 3. PAF with RVR 4. Recent pneumonia 5. NSTEMI type II, CTA negative for PE - cath showing calcified but widely patent coronaries 04/20/14 6. HTN 7. Previous TIA 8. Hyponatremia 9. CKD stage III 10. Anxiety/depression/bipolar I disorder 11. NSVT by telemetry this admission  In light of GI bleeding and newly diagnosed cecal mass, will need to hold off  anticoagulation including no aspirin/Coumadin until cleared by GI. CHADSVASC is 5 but risk of anticoag outweighs benefit at this point. Continue statin and BB. Given CKD stage III and NSVT, will recheck BMET and Mg this AM post-cath. BP appears somewhat labile - 112/56 last night up to 165/146 (?) this AM. With regard to RVR, it appears her transfer disrupted some of her scheduled meds last night. Did not get Coreg yesterday PM. IV diltiazem started overnight and is running at 10mg /hr but HR still 120s-130s. Will give IV Lopressor 5mg  now, DC Coreg, and change to Lopressor q8hr to attempt to gain more HR control.   Signed, Melina Copa PA-C  I have examined the patient and reviewed assessment and plan and discussed with patient.  Agree with above as stated.  Titrate metoprolol.  IV Cardizem for rate control.  No significant CAD.  Palpable left PT pulse.  Left groin cath yesterday.  Going back to [facility] later today.  Patric Buckhalter S.

## 2014-04-21 NOTE — Progress Notes (Signed)
She is currently at [facility] in afib.  She has a nonobstructing adenocarcinoma of the cecum.  Once it is felt to be safe to operate on her from a cardiac standpoint, we recommend a right colectomy.

## 2014-04-21 NOTE — Progress Notes (Signed)
At around 5:22 AM pt  noted w/ a burst of rapid heart rate between 120's to 160's getting back to bed after using the bedside commode. No chest pain. Pt c/o SOB. BP=165/126 Resp= 28 on RA. Placed on O2 @ 2L/min. EKG done & confirmed Afib with RVR. Dr. Colon Flattery informed. Cardizem gtt started with bolus 10 mg given then titrated. Will continue to monitor pt.

## 2014-04-21 NOTE — Progress Notes (Signed)
[facility] Team 1 Progress Note   Patient ID: Jenny [name]  female  VHQ:[id number]    DOB: [birth year]    DOA: 04/13/2014  PCP: Estill Dooms, MD  hospitalization course 80 yo female with multiple and complex medical problems including HTN, HLD, dementia, CHF, CAD, and hypothyroidism, who presented to [facility] ED with main concern of several days duration of progressively worsening fatigue, lethargy, and shortness of breath. In the ED, pt was noted to be in atrial fibrillation with HR in 150's, initial troponin elevated at 1.76. Na 122.   Patient was admitted to the stepdown unit, and placed on a Cardizem drip. Cardiology was consulted.  The patient was noted to have NSTEMI with elevated troponin. A nuclear medicine stress test 6/5 showed a moderate sized, partially reversible inferolateral perfusion defect, consistent with ischemia, EF 54%. Patient also developed anemia during the hospitalization.  Cardiology recommended an anemia workup prior to cardiac cath. EGD was normal. CT of the abdomen and pelvis revealed a cecal mass, and the patient underwent coloscopy on 6/8 which showed a large exophytic bleeding mass encompassing the ileocecal valve. General surgery was consulted.   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 Biopsy positive for adenocarcinoma - Gen Surgery following for resection when medically cleared  -will consult Oncology for eval upon return to [facility]   Atrial fibrillation with RVR: Paroxysmal - Heart rate now again controlled, currently in normal sinus rhythm, Cardizem drip being rapidly weaned to off  - Not on any anticoagulation at this time due to anemia, newly diagnosed colon cancer - CHADSVASC is 5 so would like to  initiate anticoag as soon as safely feasible  - Echo done outpatient EF 65%. LV ef normal, no wma, no valvular abn   -TSH normal,  d-dimer was elevated, CT angiography chest negative for pulmonary embolism  NSTEMI  -No chest pain, Troponins trending down. -Stress Myoview test showed moderate size and intensity, partially reversible inferolateral perfusion defect (SDS 4), consistent with ischemia - Extent 19%. LVEF 54%, normal wall motion -Cardiac cath revealed no signif coronary lesion   Acute on chronic diastolic CHF -CHF likely precipitated due to rapid A. Fib - appears well compensated at this time   Bronchitis, chronic - Recently treated for pneumonia, continue Xopenex nebs, Mucinex  - has completed a course of Ceftin   Hyponatremia -  Recurring - follow volume   Hypothyroidism - TSH 1.69, continue Synthroid  Anxiety, depression  Bipolar I disorder - Continue Klonopin per her home regimen, continue Zoloft  GERD  -continue PPI  Chronic dysphagia - Stable   DVT Prophylaxis: SCDs  Code Status: FULL   Family Communication:   No family present at time of exam - caregiver at bedside   Disposition: transfer back to [facility] for ongoing colon CA care when safe to travel   Consultants:  Cardiology   GI  General surgery  Antibiotics:  Ceftin 6/2 >> 6/7  Subjective: Pt is quite pleasant and in good spirits.  Denies cp, n/v, abdom pain, or sob.  Has a poor appetite.   Objective: Weight change: 0.8 kg (1 lb 12.2 oz)  Intake/Output Summary (Last 24 hours) at 04/21/14 1317 Last data filed at 04/21/14 0900  Gross per 24 hour  Intake    920 ml  Output   2075 ml  Net  -1155 ml   Blood pressure 138/68, pulse 110, temperature 98.1 F (36.7 C), temperature source Oral, resp. rate 18, height 5\' 4"  (1.626 m), weight 65.9 kg (145 lb 4.5 oz), SpO2 95.00%.  Physical Exam: General: no acute respiratory distress CVS: RRR at time of exam w/ HR 80 - no appreciable M or rub    Chest: CTA B w/o wheeze or focal crackles  Abdomen: soft, nontender, nondistended, normal bowel sounds  Extremities: no cyanosis, clubbing or edema noted bilaterally  Lab Results: Basic Metabolic Panel:  Recent Labs Lab 04/19/14 0350 04/21/14 0830  NA 136* 128*  K 4.7 5.3  CL 99 93*  CO2 21 20  GLUCOSE 102* 131*  BUN 18 12  CREATININE 1.17* 0.98  CALCIUM 9.2 9.1  MG  --  1.7   Liver Function Tests: No results found for this basename: AST, ALT, ALKPHOS, BILITOT, PROT, ALBUMIN,  in the last 168 hours  CBC:  Recent Labs Lab 04/17/14 0416 04/19/14 0350  WBC 10.3 9.2  HGB 11.8* 11.2*  HCT 35.3* 33.9*  MCV 85.3 85.4  PLT 457* 508*    Micro Results: Recent Results (from the past 240 hour(s))  MRSA PCR SCREENING     Status: None   Collection Time    04/13/14  7:57 PM      Result Value Ref Range Status   MRSA by PCR NEGATIVE  NEGATIVE Final   Comment:            The GeneXpert MRSA Assay (FDA     approved for NASAL specimens     only), is one component of a     comprehensive MRSA colonization     surveillance program. It is not     intended to diagnose MRSA     infection nor to guide or     monitor treatment for     MRSA infections.  URINE CULTURE     Status: None   Collection Time    04/14/14  6:33 AM      Result Value Ref Range Status   Specimen Description URINE, CATHETERIZED   Final   Special Requests NONE   Final   Culture  Setup Time     Final   Value: 04/14/2014 11:34     Performed at [place]     Final   Value: NO GROWTH     Performed at [facility]   Culture     Final   Value: NO GROWTH     Performed at [facility]   Report Status 04/15/2014 FINAL   Final   Medications: Scheduled Meds: . atorvastatin  10 mg Oral q1800  . benzonatate  100 mg Oral TID  . brimonidine  1 drop Both Eyes Q12H   And  . timolol  1 drop Both Eyes Q12H  . calcitonin (salmon)  1 spray Alternating Nares Daily  .  calcium-vitamin D  1 tablet Oral TID  . clonazePAM  0.5 mg Oral QHS  . ferrous sulfate  325 mg Oral BID WC  . furosemide  20 mg Oral BID  . lamoTRIgine  200 mg Oral Daily  . levothyroxine  88 mcg Oral QAC breakfast  . losartan  50 mg Oral Daily  . metoprolol tartrate  25 mg Oral 3 times per day  . pantoprazole  40  mg Oral Daily  . pilocarpine  5 mg Oral TID  . potassium chloride SA  20 mEq Oral BID  . saccharomyces boulardii  250 mg Oral BID  . senna  2 tablet Oral QHS  . sertraline  25 mg Oral Daily   Time spent:  30mins   LOS: 8 days   Cherene Altes, MD [facility] For Consults/Admissions - Flow Manager - [phone] Office  [phone] Pager [phone]  On-Call/Text Page:      [email or web address]      password [facility]  04/21/2014, 1:17 PM

## 2014-04-21 NOTE — Progress Notes (Signed)
Report given to Flonnie Hailstone RN on Telemetry unit at [facility].

## 2014-04-22 ENCOUNTER — Other Ambulatory Visit: Payer: Medicare Other

## 2014-04-22 ENCOUNTER — Inpatient Hospital Stay (HOSPITAL_COMMUNITY): Payer: Medicare Other

## 2014-04-22 DIAGNOSIS — I472 Ventricular tachycardia: Secondary | ICD-10-CM | POA: Diagnosis not present

## 2014-04-22 DIAGNOSIS — J189 Pneumonia, unspecified organism: Secondary | ICD-10-CM | POA: Diagnosis not present

## 2014-04-22 DIAGNOSIS — I214 Non-ST elevation (NSTEMI) myocardial infarction: Secondary | ICD-10-CM | POA: Diagnosis not present

## 2014-04-22 DIAGNOSIS — I5033 Acute on chronic diastolic (congestive) heart failure: Secondary | ICD-10-CM | POA: Diagnosis not present

## 2014-04-22 DIAGNOSIS — D509 Iron deficiency anemia, unspecified: Secondary | ICD-10-CM

## 2014-04-22 DIAGNOSIS — I4729 Other ventricular tachycardia: Secondary | ICD-10-CM | POA: Diagnosis not present

## 2014-04-22 DIAGNOSIS — Z87891 Personal history of nicotine dependence: Secondary | ICD-10-CM

## 2014-04-22 DIAGNOSIS — C180 Malignant neoplasm of cecum: Secondary | ICD-10-CM

## 2014-04-22 MED ORDER — ENSURE COMPLETE PO LIQD
237.0000 mL | Freq: Two times a day (BID) | ORAL | Status: DC
  Administered 2014-04-22 – 2014-04-25 (×3): 237 mL via ORAL

## 2014-04-22 MED ORDER — DILTIAZEM HCL 30 MG PO TABS
30.0000 mg | ORAL_TABLET | Freq: Once | ORAL | Status: AC
  Administered 2014-04-22: 30 mg via ORAL
  Filled 2014-04-22: qty 1

## 2014-04-22 MED ORDER — GUAIFENESIN 200 MG PO TABS
400.0000 mg | ORAL_TABLET | Freq: Two times a day (BID) | ORAL | Status: DC
  Administered 2014-04-22 – 2014-04-26 (×8): 400 mg via ORAL
  Filled 2014-04-22 (×10): qty 2

## 2014-04-22 MED ORDER — DILTIAZEM HCL 90 MG PO TABS
45.0000 mg | ORAL_TABLET | Freq: Once | ORAL | Status: AC
  Administered 2014-04-23: 45 mg via ORAL
  Filled 2014-04-22: qty 0.5

## 2014-04-22 MED ORDER — DILTIAZEM HCL ER COATED BEADS 120 MG PO CP24
120.0000 mg | ORAL_CAPSULE | Freq: Every day | ORAL | Status: DC
  Administered 2014-04-22: 120 mg via ORAL
  Filled 2014-04-22 (×2): qty 1

## 2014-04-22 NOTE — Progress Notes (Signed)
Speech Language Pathology Treatment: Dysphagia  Patient Details Name: Jenny [name] MRN: [id number] DOB: 23-Oct-1927 Today's Date: 04/22/2014 Time: 8341-9622 SLP Time Calculation (min): 10 min  Assessment / Plan / Recommendation Clinical Impression  Pt was provided with suggestions and strategies to mitigate aspiration and decrease adverse effects of reflux/ esophageal dysmotility, including small bites/sips, slow rate, remain upright 30 minutes after meals, minimizing distractions during meals, and beginning meals with warm liquid. These were reviewed and left with pt. As discussed with pt and her daughter during BSE, repeat MBS is not recommended at this time.  Recommend adherence to reviewed precautions. ST available if needs arise. ST to sign off.   HPI HPI: 80 year old female admitted 04/13/14 due to SOB. PMH significant for NSTEMI, dementia, PNA (1/15), reflux esophagitis, dysphagia. CXR 04/22/14 indicated BLL atelectasis. EGD indicated presbyesophagus.   Pertinent Vitals VSS  SLP Plan    DC ST. Request reconsult if needs arise.   Recommendations Diet recommendations: Dysphagia 3 (mechanical soft);Thin liquid Liquids provided via: Cup;Straw Medication Administration: Whole meds with puree Supervision: Patient able to self feed;Intermittent supervision to cue for compensatory strategies Compensations: Small sips/bites;Slow rate;Follow solids with liquid Postural Changes and/or Swallow Maneuvers: Seated upright 90 degrees;Upright 30-60 min after meal              Oral Care Recommendations: Oral care Q4 per protocol Follow up Recommendations: 24 hour supervision/assistance    GO   Eyvette Cordon B. Wright, [facility], [place]  Shonna Chock 04/22/2014, 2:06 PM

## 2014-04-22 NOTE — Progress Notes (Signed)
2 Days Post-Op  Subjective: Per aides, having a lot of product coughing, violent at times.  Eating solid food.  Keeps asking what would happen if she did not have the surgery.  Objective: Vital signs in last 24 hours: Temp:  [98 F (36.7 C)-98.4 F (36.9 C)] 98.3 F (36.8 C) (06/11 0501) Pulse Rate:  [60-110] 82 (06/11 0501) Resp:  [18-22] 20 (06/11 0501) BP: (120-141)/(47-69) 141/59 mmHg (06/11 0501) SpO2:  [94 %-97 %] 95 % (06/11 0501) Weight:  [146 lb 9.6 oz (66.497 kg)-148 lb 9.4 oz (67.4 kg)] 148 lb 9.4 oz (67.4 kg) (06/11 0501) Last BM Date: 04/21/14  Intake/Output from previous day: 06/10 0701 - 06/11 0700 In: 680 [P.O.:600; I.V.:80] Out: 1525 [Urine:1525] Intake/Output this shift:    PE: General- In NAD, coughing Abdomen-soft, nontender  Lab Results:  No results found for this basename: WBC, HGB, HCT, PLT,  in the last 72 hours BMET  Recent Labs  04/21/14 0830  NA 128*  K 5.3  CL 93*  CO2 20  GLUCOSE 131*  BUN 12  CREATININE 0.98  CALCIUM 9.1   PT/INR  Recent Labs  04/20/14 0725  LABPROT 13.6  INR 1.06   Comprehensive Metabolic Panel:    Component Value Date/Time   NA 128* 04/21/2014 0830   NA 136* 04/19/2014 0350   NA 117* 04/13/2014   NA 124* 04/08/2014   K 5.3 04/21/2014 0830   K 4.7 04/19/2014 0350   CL 93* 04/21/2014 0830   CL 99 04/19/2014 0350   CO2 20 04/21/2014 0830   CO2 21 04/19/2014 0350   BUN 12 04/21/2014 0830   BUN 18 04/19/2014 0350   BUN 12 04/13/2014   BUN 15 04/08/2014   CREATININE 0.98 04/21/2014 0830   CREATININE 1.17* 04/19/2014 0350   CREATININE 1.0 04/13/2014   CREATININE 1.1 04/08/2014   GLUCOSE 131* 04/21/2014 0830   GLUCOSE 102* 04/19/2014 0350   CALCIUM 9.1 04/21/2014 0830   CALCIUM 9.2 04/19/2014 0350   AST 21 04/13/2014 1519   AST 15 02/02/2014   ALT 15 04/13/2014 1519   ALT 9 02/02/2014   ALKPHOS 92 04/13/2014 1519   ALKPHOS 69 02/02/2014   BILITOT <0.2* 04/13/2014 1519   BILITOT <0.2* 11/16/2013 0502   PROT 6.4 04/13/2014 1519   PROT 6.0  11/16/2013 0502   ALBUMIN 3.1* 04/13/2014 1519   ALBUMIN 2.3* 11/20/2013 0534     Studies/Results: Dg Chest Port 1 View  04/22/2014   CLINICAL DATA:  Short of breath.  Rule out infiltrate  EXAM: PORTABLE CHEST - 1 VIEW  COMPARISON:  04/14/2014  FINDINGS: No change in bibasilar linear densities consistent with atelectasis. Increased left pleural effusion. Negative for edema or pneumonia.  IMPRESSION: Bibasilar atelectasis unchanged.  Increase in small left effusion   Electronically Signed   By: Franchot Gallo M.D.   On: 04/22/2014 07:23    Anti-infectives: Anti-infectives   Start     Dose/Rate Route Frequency Ordered Stop   04/13/14 2000  cefUROXime (CEFTIN) tablet 500 mg  Status:  Discontinued     500 mg Oral 2 times daily with meals 04/13/14 1840 04/19/14 0951      Assessment  [name], violent at times   NSTEMI (non-ST elevated myocardial infarction)-no significant coronary artery occlusive disease on cath   Anemia-last hemoglobin was 11.2 on 6/8     LOS: 9 days   Plan:  She is not ready for surgery from a pulmonary standpoint.  High risk of post op cardiac event (recurrent afib in particular).   Options are to try to optimize her pulmonary status while here in the hospital and do her surgery next week or send her home on Iron and a soft diet, optimize her pulmonary status and strength, then return for elective partial colectomy.   Kyanna Mahrt J 04/22/2014

## 2014-04-22 NOTE — Progress Notes (Signed)
Spoke with her and her daughter and answered their questions.  Jenny [name] still is deciding whether or not she wants to have the surgery.

## 2014-04-22 NOTE — Progress Notes (Signed)
INITIAL NUTRITION ASSESSMENT  DOCUMENTATION CODES Per approved criteria  -Not Applicable   INTERVENTION: -Recommend diet downgrade to Dys3/thin per SLP evaluation -Recommend Ensure Complete po BID, each supplement provides 350 kcal and 13 grams of protein -Will continue to monitor  NUTRITION DIAGNOSIS: Inadequate oral intake related to early satiety/decreased appetite as evidenced by PO intake <75%.   Goal: Pt to meet >/= 90% of their estimated nutrition needs    Monitor:  Total protein/energy intake, labs, weights  Reason for Assessment: Consult to Assess  80 y.o. female  Admitting Dx: Atrial fibrillation with RVR  ASSESSMENT: Pt is 80 yo female with multiple and complex medical problems including HTN, HLD, dementia, CHF, CAD, hypothyroidism, who presented to [facility] ED with main concern of several days duration of progressively worsening fatigue, lethargy, shortness of breath  -Pt denied any significant changes in appetite pta, diet recall indicates pt consumes three balanced meals/day at ALF -Noted possible weight loss, previous medical records indicate a 2 lb weight loss in 3 months (1.3% body weight, non-significant for time frame) -Appetite has decreased during admit, eating approximately 50% of meals. Noted some feelings of early satiety and overall disinterest in food -MD noted pt with newly dx colon cancer. Surgery pending family discussion for [place].  -Recommend Ensure Complete BID for nutrient replenishment -Pt evaluated by SLP, recommended Dysphagia 3/thin. Will downgrade diet and assess tolerance. Currently on Soft/Bland diet Nutrition Focused Physical Exam:  Subcutaneous Fat:  Orbital Region: WDL Upper Arm Region: WDL Thoracic and Lumbar Region: n/a  Muscle:  Temple Region: WDL Clavicle Bone Region: WDL Clavicle and Acromion Bone Region: WDL Scapular Bone Region: WDL Dorsal Hand: WDL Patellar Region: WDL Anterior Thigh Region: WDL Posterior Calf Region:  WDL  Edema: none noted    Height: Ht Readings from Last 1 Encounters:  04/21/14 5\' 4"  (1.626 m)    Weight: Wt Readings from Last 1 Encounters:  04/22/14 148 lb 9.4 oz (67.4 kg)    Ideal Body Weight: 120 lbs  % Ideal Body Weight: 123%  Wt Readings from Last 10 Encounters:  04/22/14 148 lb 9.4 oz (67.4 kg)  04/22/14 148 lb 9.4 oz (67.4 kg)  04/22/14 148 lb 9.4 oz (67.4 kg)  04/22/14 148 lb 9.4 oz (67.4 kg)  04/22/14 148 lb 9.4 oz (67.4 kg)  01/29/14 150 lb (68.04 kg)  11/20/13 157 lb 1.6 oz (71.26 kg)  09/24/13 151 lb (68.493 kg)  08/27/13 152 lb (68.947 kg)  08/06/13 152 lb (68.947 kg)    Usual Body Weight: 150 lbs per previous medical records  % Usual Body Weight: 99%  BMI:  Body mass index is 25.49 kg/(m^2).  Estimated Nutritional Needs: Kcal: 1500-1700 Protein: 70-80 gram Fluid: >/=1500 ml/daily  Skin: WDL  Diet Order: Criss Rosales  EDUCATION NEEDS: -No education needs identified at this time   Intake/Output Summary (Last 24 hours) at 04/22/14 1536 Last data filed at 04/22/14 1300  Gross per 24 hour  Intake    720 ml  Output   1450 ml  Net   -730 ml    Last BM: 6/11   Labs:   Recent Labs Lab 04/17/14 0416 04/19/14 0350 04/21/14 0830  NA 132* 136* 128*  K 4.4 4.7 5.3  CL 94* 99 93*  CO2 26 21 20   BUN 12 18 12   CREATININE 0.87 1.17* 0.98  CALCIUM 9.3 9.2 9.1  MG  --   --  1.7  GLUCOSE 111* 102* 131*    CBG (last 3)  No results found for this basename: GLUCAP,  in the last 72 hours  Scheduled Meds: . atorvastatin  10 mg Oral q1800  . brimonidine  1 drop Both Eyes Q12H   And  . timolol  1 drop Both Eyes Q12H  . calcitonin (salmon)  1 spray Alternating Nares Daily  . calcium-vitamin D  1 tablet Oral TID  . clonazePAM  0.5 mg Oral QHS  . diltiazem  120 mg Oral Daily  . feeding supplement (ENSURE COMPLETE)  237 mL Oral BID BM  . ferrous sulfate  325 mg Oral BID WC  . furosemide  20 mg Oral BID  . guaiFENesin  400 mg Oral BID  .  lamoTRIgine  200 mg Oral Daily  . levothyroxine  88 mcg Oral QAC breakfast  . metoprolol tartrate  25 mg Oral 3 times per day  . pantoprazole  40 mg Oral Daily  . pilocarpine  5 mg Oral TID  . saccharomyces boulardii  250 mg Oral BID  . senna  2 tablet Oral QHS  . sertraline  25 mg Oral Daily    Continuous Infusions: . sodium chloride      Past Medical History  Diagnosis Date  . Hypertension   . Coronary artery disease   . Heart murmur   . Hypothyroidism 05-02-12    tx. Synthroid  . Headache(784.0) 05-02-12    past hx. migrianes-many yrs ago  . Arthritis 05-02-12    osteoarthritis-rt. hip  . Pneumonia 05-02-12    mild case  . 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 Acute upper respiratory infections of unspecified site 2014  . Unspecified venous (peripheral) insufficiency 2013  . Senile osteoporosis 2013  . Unspecified closed fracture of pelvis 10/13/2012  . Cellulitis and abscess of leg, except foot 2013  . Altered mental status 2013  . Chronic pain syndrome 2013  . Urinary tract infection, site not specified   . Hyposmolality and/or hyponatremia 05/2012  . Congestive heart failure, unspecified 05/2012  . Pneumonia, organism unspecified 05/2012  . Pressure ulcer, buttock(707.05) 05/2012  . Hypoxemia 05/2012  . Reflux esophagitis 3013  . Unspecified constipation 2013  . Acute posthemorrhagic anemia 05/2012  . Coronary atherosclerosis of native coronary artery 05/2012  . Pain in joint, pelvic region and thigh 04/2012  . Dysphagia, unspecified(787.20) 03/2012  . Benign neoplasm of adrenal gland 10/2011  . Alzheimer's disease 06/2011  . Other seborrheic keratosis 2012  . Hypertensive renal disease, benign 2012  . Pain in joint, lower leg 2012  . Memory loss 09/2010  . Spinal stenosis, lumbar region, without neurogenic claudication 2011  . Unspecified vitamin D deficiency 2010  . Unspecified disorder of kidney and ureter 2010  . Edema 2010  . Other malaise and fatigue 2009  .  Congenital factor VIII disorder 2009  . Senile dementia with delusional features 2008  . Generalized hyperhidrosis 2007  . Irritable bowel syndrome 2007  . Unspecified chronic bronchitis 2006  . Depression 06/2005     depression only,tx. oral meds  . Shortness of breath 2005  . Anxiety 2004  . Unspecified late effects of cerebrovascular disease 2004  . Migraine with aura, without mention of intractable migraine without mention of status migrainosus 2003  . Tinnitus 2003  . Other symptoms involving cardiovascular system 2003  . Hyperlipidemia 2002    Past Surgical History  Procedure Laterality Date  . Tonsillectomy    . Tubal ligation  05-02-12  .  tummy tuck  05-02-12    many  yrs ago  . Cataract extraction, bilateral  05-02-12    bilateral  . Retinal detachment surgery  05-02-12    left  . Total hip arthroplasty  05/09/2012    Procedure: TOTAL HIP ARTHROPLASTY ANTERIOR APPROACH;  Surgeon: Mcarthur Rossetti, MD;  Location: [facility] ORS;  Service: Orthopedics;  Laterality: Right;  . Dental surgery  08/11/13    3 teeth surgical removed by Dr. Lewanda Rife  . Esophagogastroduodenoscopy N/A 04/16/2014    Procedure: ESOPHAGOGASTRODUODENOSCOPY (EGD);  Surgeon: Lafayette Dragon, MD;  Location: [facility];  Service: Endoscopy;  Laterality: N/A;  . Colonoscopy N/A 04/19/2014    Procedure: COLONOSCOPY;  Surgeon: [name], MD;  Location: [facility] ENDOSCOPY;  Service: Endoscopy;  Laterality: N/A;    Atlee Abide MS RD LDN Clinical Dietitian IBBCW:[phone]

## 2014-04-22 NOTE — Progress Notes (Signed)
CSW following for discharge planning - patient is from ALF at [facility] but will go to their SNF when medically ready. CSW has completed FL2 & will continue to follow and assist with return.    Raynaldo Opitz, [facility] Clinical Social Worker cell #: [phone]

## 2014-04-22 NOTE — Evaluation (Signed)
Physical Therapy Evaluation Patient Details Name: Jenny [name] MRN: [id number] DOB: [birth year] Today's Date: 04/22/2014   History of Present Illness  80 yo female admitted with A fib with RVR, NSTEMI, anemia, new diagnosis of cecal mass/carcinoma with possible lung mets. Hx of HTN, dementia, CHF, CAD. Pt is from [facility] ALF.   Clinical Impression  On eval, pt required Mod assist for mobility-able to ambulate ~50 feet with rolling walker. Demonstrates general weakness, decreased activity tolerance and impaired gait and balance. Recommend ST rehab at SNF prior to returning to ALF.    Follow Up Recommendations SNF    Equipment Recommendations  None recommended by PT    Recommendations for Other Services       Precautions / Restrictions Precautions Precautions: Fall Restrictions Weight Bearing Restrictions: No      Mobility  Bed Mobility                  Transfers Overall transfer level: Needs assistance Equipment used: Rolling walker (2 wheeled) Transfers: Sit to/from Stand Sit to Stand: Mod assist         General transfer comment: Unsteady with initial standing. Assist to rise, stabilize, control descent. VCs safety, hand placement.   Ambulation/Gait Ambulation/Gait assistance: Min assist Ambulation Distance (Feet): 50 Feet Assistive device: Rolling walker (2 wheeled) Gait Pattern/deviations: Step-through pattern;Decreased stride length;Trunk flexed     General Gait Details: slow gait speed. Assist to stabilize throughout ambulation. Fatigues easily.   Stairs            Wheelchair Mobility    Modified Rankin (Stroke Patients Only)       Balance                                             Pertinent Vitals/Pain Pt denied pain    Home Living Family/patient expects to be discharged to:: Assisted living   Available Help at Discharge: Personal care attendant;Available 24 hours/day           Home Equipment: Walker  - 4 wheels Additional Comments: Pt lives in ALF at [facility]    Prior Function Level of Independence: Needs assistance               Hand Dominance        Extremity/Trunk Assessment   Upper Extremity Assessment: Generalized weakness           Lower Extremity Assessment: Generalized weakness      Cervical / Trunk Assessment: Kyphotic  Communication   Communication: No difficulties  Cognition Arousal/Alertness: Awake/alert Behavior During Therapy: WFL for tasks assessed/performed Overall Cognitive Status: History of cognitive impairments - at baseline                      General Comments      Exercises        Assessment/Plan    PT Assessment Patient needs continued PT services  PT Diagnosis Difficulty walking;Generalized weakness   PT Problem List Decreased strength;Decreased activity tolerance;Decreased balance;Decreased mobility;Decreased knowledge of use of DME;Decreased cognition  PT Treatment Interventions DME instruction;Gait training;Functional mobility training;Therapeutic activities;Therapeutic exercise;Patient/family education   PT Goals (Current goals can be found in the Care Plan section) Acute Rehab PT Goals Patient Stated Goal: to get stronger PT Goal Formulation: With patient Time For Goal Achievement: 05/06/14 Potential to Achieve Goals: Good  Frequency Min 3X/week   Barriers to discharge        Co-evaluation               End of Session Equipment Utilized During Treatment: Gait belt Activity Tolerance: Patient limited by fatigue Patient left: in chair;with call bell/phone within reach;with nursing/sitter in room           Time: 1411-1420 PT Time Calculation (min): 9 min   Charges:   PT Evaluation $Initial PT Evaluation Tier I: 1 Procedure PT Treatments $Gait Training: 8-22 mins   PT G Codes:          Weston Anna, MPT Pager: [phone]

## 2014-04-22 NOTE — Progress Notes (Signed)
    Subjective:  No chest pain or palpitations. C/o cough.  Objective:  Vital Signs in the last 24 hours: Temp:  [98 F (36.7 C)-98.4 F (36.9 C)] 98.3 F (36.8 C) (06/11 0501) Pulse Rate:  [60-110] 82 (06/11 0501) Resp:  [18-22] 20 (06/11 0501) BP: (120-182)/(47-133) 141/59 mmHg (06/11 0501) SpO2:  [89 %-97 %] 95 % (06/11 0501) Weight:  [66.497 kg (146 lb 9.6 oz)-67.4 kg (148 lb 9.4 oz)] 67.4 kg (148 lb 9.4 oz) (06/11 0501)  Intake/Output from previous day: 06/10 0701 - 06/11 0700 In: 680 [P.O.:600; I.V.:80] Out: 1525 [Urine:1525]  Physical Exam: Pt is alert and oriented, pleasant elderly woman in NAD HEENT: normal Neck: JVP - normal Lungs: CTA bilaterally CV: RRR with 2/6 systolic murmur at the LSB Abd: soft, NT, Positive BS, no hepatomegaly Ext: no C/C/E, distal pulses intact and equal Skin: warm/dry no rash   Lab Results: No results found for this basename: WBC, HGB, PLT,  in the last 72 hours  Recent Labs  04/21/14 0830  NA 128*  K 5.3  CL 93*  CO2 20  GLUCOSE 131*  BUN 12  CREATININE 0.98   No results found for this basename: TROPONINI, CK, MB,  in the last 72 hours  Cardiac Studies: Cath/echo reviewed.  Tele: Sinus rhythm, no recurrence of AF.  Assessment/Plan:  1. PAF - back in sinus rhythm. Now on metoprolol, cardizem gtt stopped yesterday. Will start low-dose oral dilt CD. No anticoag until after surgery.  2. NSTEMI - suspect demand ischemia in setting of AF. No high-grade obstructive disease by cath. 3. Anemia/GI bleed - cecal mass 4. CKD, stage III 5. HTN  Cardiac stable for surgery if patient decides to proceed. Discussion with surgical team planned today. She is at high likelihood of post-op AF which can be managed medically if it occurs.    Sherren Mocha, M.D. 04/22/2014, 7:14 AM

## 2014-04-22 NOTE — Progress Notes (Signed)
Patient ID: Jenny [name]  female  JOI:[id number]    DOB: November 10, 1927    DOA: 04/13/2014  PCP: Estill Dooms, MD  hospitalization course 80 yo female with multiple and complex medical problems including HTN, HLD, dementia, CHF, CAD, and hypothyroidism, who presented to [facility] ED with main concern of several days duration of progressively worsening fatigue, lethargy, and shortness of breath. In the ED, pt was noted to be in atrial fibrillation with HR in 150's, initial troponin elevated at 1.76. Na 122.   Patient was admitted to the stepdown unit, and placed on a Cardizem drip. Cardiology was consulted.  The patient was noted to have NSTEMI with elevated troponin. A nuclear medicine stress test 6/5 showed a moderate sized, partially reversible inferolateral perfusion defect, consistent with ischemia, EF 54%. Patient also developed anemia during the hospitalization.  Cardiology recommended an anemia workup prior to cardiac cath. EGD was normal. CT of the abdomen and pelvis revealed a cecal mass, and the patient underwent coloscopy on 6/8 which showed a large exophytic bleeding mass encompassing the ileocecal valve. General surgery was consulted.   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 Biopsy positive for adenocarcinoma -Oncology consulted. -surgery evaluating for surgery.    Bronchitis, chronic - Recently treated for pneumonia, continue Xopenex nebs, Mucinex  - has completed a course of Ceftin  -Will order sputum culture. Schedule guaifenesin. Incentive spirometry.  -Swallow  Evaluation.   Atrial fibrillation with RVR: Paroxysmal - Heart rate now again controlled, currently in normal sinus rhythm, Cardizem drip being  rapidly weaned to off  - Not on any anticoagulation at this time due to anemia, newly diagnosed colon cancer - CHADSVASC is 5 so would like to initiate anticoag as soon as safely feasible  - Echo done outpatient EF 65%. LV ef normal, no wma, no valvular abn   -TSH normal,  d-dimer was elevated, CT angiography chest negative for pulmonary embolism  NSTEMI  -No chest pain, Troponins trending down. -Stress Myoview test showed moderate size and intensity, partially reversible inferolateral perfusion defect (SDS 4), consistent with ischemia - Extent 19%. LVEF 54%, normal wall motion -Cardiac cath revealed no signif coronary lesion   Acute on chronic diastolic CHF -CHF likely precipitated due to rapid A. Fib - appears well compensated at this time  -Continue with lasix BID.    Hyponatremia -  Recurring - follow volume   Hypothyroidism - TSH 1.69, continue Synthroid  Anxiety, depression  Bipolar I disorder - Continue Klonopin per her home regimen, continue Zoloft  GERD  -continue PPI  Chronic dysphagia - Stable   DVT Prophylaxis: SCDs  Code Status: FULL   Family Communication:   No family present at time of exam - caregiver at bedside   Disposition: To be determine. Oncology consulted.   Consultants:  Cardiology   GI  General surgery  Antibiotics:  Ceftin 6/2 >> 6/7  Subjective: Patient with persistent cough. No worsening SOB.   Objective: Weight change: 0.597 kg (1 lb 5.1 oz)  Intake/Output Summary (Last 24 hours) at 04/22/14 1126 Last data filed at 04/22/14 0634  Gross per 24 hour  Intake    440 ml  Output   1525 ml  Net  -  1085 ml   Blood pressure 145/55, pulse 82, temperature 98.3 F (36.8 C), temperature source Oral, resp. rate 20, height 5\' 4"  (1.626 m), weight 67.4 kg (148 lb 9.4 oz), SpO2 95.00%.  Physical Exam: General: no acute respiratory distress CVS: RRR no appreciable M or rub  Chest: bilateral fine crackles.  Abdomen: soft, nontender,  nondistended, normal bowel sounds  Extremities: no cyanosis, clubbing or edema noted bilaterally  Lab Results: Basic Metabolic Panel:  Recent Labs Lab 04/19/14 0350 04/21/14 0830  NA 136* 128*  K 4.7 5.3  CL 99 93*  CO2 21 20  GLUCOSE 102* 131*  BUN 18 12  CREATININE 1.17* 0.98  CALCIUM 9.2 9.1  MG  --  1.7   Liver Function Tests: No results found for this basename: AST, ALT, ALKPHOS, BILITOT, PROT, ALBUMIN,  in the last 168 hours  CBC:  Recent Labs Lab 04/17/14 0416 04/19/14 0350  WBC 10.3 9.2  HGB 11.8* 11.2*  HCT 35.3* 33.9*  MCV 85.3 85.4  PLT 457* 508*    Micro Results: Recent Results (from the past 240 hour(s))  MRSA PCR SCREENING     Status: None   Collection Time    04/13/14  7:57 PM      Result Value Ref Range Status   MRSA by PCR NEGATIVE  NEGATIVE Final   Comment:            The GeneXpert MRSA Assay (FDA     approved for NASAL specimens     only), is one component of a     comprehensive MRSA colonization     surveillance program. It is not     intended to diagnose MRSA     infection nor to guide or     monitor treatment for     MRSA infections.  URINE CULTURE     Status: None   Collection Time    04/14/14  6:33 AM      Result Value Ref Range Status   Specimen Description URINE, CATHETERIZED   Final   Special Requests NONE   Final   Culture  Setup Time     Final   Value: 04/14/2014 11:34     Performed at [facility]     Final   Value: NO GROWTH     Performed at [facility]   Culture     Final   Value: NO GROWTH     Performed at [facility]   Report Status 04/15/2014 FINAL   Final   Medications: Scheduled Meds: . atorvastatin  10 mg Oral q1800  . brimonidine  1 drop Both Eyes Q12H   And  . timolol  1 drop Both Eyes Q12H  . calcitonin (salmon)  1 spray Alternating Nares Daily  . calcium-vitamin D  1 tablet Oral TID  . clonazePAM  0.5 mg Oral QHS  . diltiazem  120 mg Oral Daily  . ferrous  sulfate  325 mg Oral BID WC  . furosemide  20 mg Oral BID  . guaiFENesin  400 mg Oral BID  . lamoTRIgine  200 mg Oral Daily  . levothyroxine  88 mcg Oral QAC breakfast  . metoprolol tartrate  25 mg Oral 3 times per day  . pantoprazole  40 mg Oral Daily  . pilocarpine  5 mg Oral TID  . saccharomyces boulardii  250 mg Oral BID  . senna  2 tablet Oral QHS  . sertraline  25  mg Oral Daily   Time spent:  30 mins   LOS: 9 days   [place].  [facility] For Consults/Admissions Hydrologist - [phone] Office  [phone] Pager [phone]  On-Call/Text Page:      [email or web address]      password [facility]  04/22/2014, 11:26 AM

## 2014-04-22 NOTE — Consult Note (Signed)
[facility]  [phone]) [phone]   [place]  DOB: [birth year]  MR#: [id number]  CSN#: [id number]    Requesting Physician: [facility]  Primary MD: Nyoka Cowden, MD Reason for consult: Colon Cancer  History of present illness:    80 year old white female, nursing home resident, with multiple medical problems, asked to see in consultation for evaluation of colon cancer. She was admitted on 6/2 with lethargy, shortness of breath and fatigue, complicated with A fib, NSTEMI and hyponatremia. These findings followed a recent pneumonia event requiring antibiotics course. She had noticed dark stools but no bright red blood per rectum. She had no changes in the caliber of stools, diarrhea or constipation. No significant weight loss but decreased appetite seen due to recent illness. She had no nausea or vomiting.   On admission she was anemic, with a Hb of 8.3 and was guaiac positive. GI consult was obtained. She underwent colonoscopy on 6/8. A large, exophytic, bleeding mass encompassing the ileocecal valve and at least one half the circumference of the cecum, with some polyps visible -see below report- Results were consistent with adenocarcinoma ( [id number] ). EGD was essentially negative for malignancy.   Workup also  included a CT Angio of the chest on 6/3 which was negative for pulmonary emboli, but small left pleural effusion, and  3 left lung nodules were noted. COPD changes were seen -patient is a remote heavy smoker-..   CT of the abdomen and pelvis without contrast on 6/6 was remarkable for 1.9 x 4.3 x 4.6 cm eccentric cecal mass, and confirmed 7 x 11 mm left lower lobe nodule  3.5 cm left adrenal mass, unchanged since 2012 (likely benign adenoma)  And small left pleural effusion. CEA is 48.  Surgical evaluation was obtained. No plans for immediate partial colectomy due to current respiratory status.     Past medical history:        Past Medical History  Diagnosis Date  . Hypertension   . Coronary artery disease   . Heart murmur   . Hypothyroidism 05-02-12    tx. Synthroid  . Headache(784.0) 05-02-12    past hx. migrianes-many yrs ago  . Arthritis 05-02-12    osteoarthritis-rt. hip  . Pneumonia 05-02-12    mild case  . 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 Acute upper respiratory infections of unspecified site 2014  . Unspecified venous (peripheral) insufficiency 2013  . Senile osteoporosis 2013  . Unspecified closed fracture of pelvis 10/13/2012  . Cellulitis and abscess of leg, except foot 2013  . Altered mental status 2013  . Chronic pain syndrome 2013  . Urinary tract infection, site not specified   . Hyposmolality and/or hyponatremia 05/2012  . Congestive heart failure, unspecified 05/2012  . Pneumonia, organism unspecified 05/2012  . Pressure ulcer, buttock(707.05) 05/2012  . Hypoxemia 05/2012  . Reflux esophagitis 3013  . Unspecified constipation 2013  . Acute posthemorrhagic anemia 05/2012  . Coronary atherosclerosis of native coronary artery 05/2012  . Pain in joint, pelvic region and thigh 04/2012  . Dysphagia, unspecified(787.20) 03/2012  . Benign neoplasm of adrenal gland 10/2011  . Alzheimer's disease 06/2011  . Other seborrheic keratosis 2012  . Hypertensive renal disease, benign 2012  . Pain in joint, lower leg 2012  . Memory loss 09/2010  . Spinal stenosis, lumbar region, without neurogenic claudication 2011  . Unspecified vitamin D deficiency 2010  . Unspecified disorder of kidney and ureter 2010  . Edema 2010  .  Other malaise and fatigue 2009  . Congenital factor VIII disorder 2009  . Senile dementia with delusional features 2008  . Generalized hyperhidrosis 2007  . Irritable bowel syndrome 2007  . Unspecified chronic bronchitis 2006  . Depression 06/2005     depression only,tx. oral meds  . Shortness of breath 2005  . Anxiety 2004  . Unspecified late effects of cerebrovascular disease 2004  .  Migraine with aura, without mention of intractable migraine without mention of status migrainosus 2003  . Tinnitus 2003  . Other symptoms involving cardiovascular system 2003  . Hyperlipidemia 2002    Past surgical history:      Past Surgical History  Procedure Laterality Date  . Tonsillectomy    . Tubal ligation  05-02-12  .  tummy tuck  05-02-12    many yrs ago  . Cataract extraction, bilateral  05-02-12    bilateral  . Retinal detachment surgery  05-02-12    left  . Total hip arthroplasty  05/09/2012    Procedure: TOTAL HIP ARTHROPLASTY ANTERIOR APPROACH;  Surgeon: Mcarthur Rossetti, MD;  Location: [facility];  Service: Orthopedics;  Laterality: Right;  . Dental surgery  08/11/13    3 teeth surgical removed by Dr. Lewanda Rife  . Esophagogastroduodenoscopy N/A 04/16/2014    Procedure: ESOPHAGOGASTRODUODENOSCOPY (EGD);  Surgeon: Lafayette Dragon, MD;  Location: [facility];  Service: Endoscopy;  Laterality: N/A;  . Colonoscopy N/A 04/19/2014    Procedure: COLONOSCOPY;  Surgeon: [name], MD;  Location: [facility];  Service: Endoscopy;  Laterality: N/A;    Medications:  Scheduled Meds: . atorvastatin  10 mg Oral q1800  . brimonidine  1 drop Both Eyes Q12H   And  . timolol  1 drop Both Eyes Q12H  . calcitonin (salmon)  1 spray Alternating Nares Daily  . calcium-vitamin D  1 tablet Oral TID  . clonazePAM  0.5 mg Oral QHS  . diltiazem  120 mg Oral Daily  . ferrous sulfate  325 mg Oral BID WC  . furosemide  20 mg Oral BID  . guaiFENesin  400 mg Oral BID  . lamoTRIgine  200 mg Oral Daily  . levothyroxine  88 mcg Oral QAC breakfast  . metoprolol tartrate  25 mg Oral 3 times per day  . pantoprazole  40 mg Oral Daily  . pilocarpine  5 mg Oral TID  . saccharomyces boulardii  250 mg Oral BID  . senna  2 tablet Oral QHS  . sertraline  25 mg Oral Daily   Continuous Infusions: . sodium chloride     PRN Meds:.acetaminophen, benzonatate, clonazePAM, guaiFENesin-dextromethorphan,  levalbuterol, menthol-cetylpyridinium, morphine injection, nitroGLYCERIN, ondansetron (ZOFRAN) IV, ondansetron, oxyCODONE, white petrolatum  Allergies:  Allergies  Allergen Reactions  . Levaquin [Levofloxacin In D5w] Hives  . Penicillins Rash    Family history:     Family History  Problem Relation Age of Onset  . Diabetes Father       Cadiac disease                                                   Mother                                   Social history:   History  Social History  . Marital Status: Married    Spouse Name: N/A    Number of Children: 3  . Years of Education: 16   Occupational History  . retired Print production planner   Social History Main Topics  . Smoking status: Former Smoker -- 1.00 packs/day for 62 years    Types: Cigarettes    Quit date: 10/10/2008  . Smokeless tobacco: Never Used  . Alcohol Use: No  . Drug Use: No  . Sexual Activity: No       ROS: Constitutional: Denies fevers, chills or abnormal night sweats Eyes: Denies blurriness of vision, double vision or watery eyes Ears, nose, mouth, throat, and face: Denies mucositis or sore throat Respiratory: Productive cough without hemoptysis.No wheezing. Cardiovascular:Intermittent palpitation, no chest discomfort , no lower extremity swelling Gastrointestinal:  Denies nausea, heartburn or change in bowel habits. Please see HPI Skin: Denies abnormal skin rashes  Lymphatics: Denies new lymphadenopathy or easy bruising Neurological: Denies numbness, tingling or new weaknesses. She has memory deficiency due to mild dementia. Knows place, names family members. Cannot recall year or month.  Behavioral/Psych: Mood is stable, no new changes  All other systems were reviewed with the patient and are negative.    Physical Exam    Filed Vitals:   04/22/14 1334  BP:   Pulse: 80  Temp:   Resp:    Filed Weights   04/21/14 0535 04/21/14 1724 04/22/14 0501  Weight: 145 lb 4.5 oz (65.9 kg) 146 lb 9.6 oz  (66.497 kg) 148 lb 9.4 oz (67.4 kg)    GENERAL: alert, no distress and comfortable SKIN: skin color, texture, turgor are normal, no rashes or significant lesions EYES: normal, conjunctiva are pink and non-injected, sclera clear OROPHARYNX: no exudate, no erythema and lips, buccal mucosa, and tongue normal  NECK: supple, thyroid normal size, non-tender, without nodularity LYMPH:  no palpable lymphadenopathy in the cervical, supraclavicular, axillary or inguinal regions LUNGS:decreased breath sounds at the bases, with normal breathing effort. Minimal rhonchi HEART: regular rate & rhythm and no murmurs and no lower extremity edema ABDOMEN:abdomen soft, non-tender, no mass, no hepatomegaly and normal bowel sounds Musculoskeletal:no cyanosis of digits and no clubbing  PSYCH: alert & oriented x 3 with fluent speech NEURO: no focal motor/sensory deficits, difficulty with memory recall   Lab results:       CBC  Recent Labs Lab 04/16/14 0351 04/17/14 0416 04/19/14 0350  WBC 13.7* 10.3 9.2  HGB 12.7 11.8* 11.2*  HCT 36.7 35.3* 33.9*  PLT 442* 457* 508*  MCV 82.8 85.3 85.4  MCH 28.7 28.5 28.2  MCHC 34.6 33.4 33.0  RDW 14.1 14.1 13.8    Anemia panel:  No results found for this basename: VITAMINB12, FOLATE, FERRITIN, TIBC, IRON, RETICCTPCT,  in the last 72 hours   Chemistries   Recent Labs Lab 04/16/14 0351 04/17/14 0416 04/19/14 0350 04/21/14 0830  NA 130* 132* 136* 128*  K 4.4 4.4 4.7 5.3  CL 91* 94* 99 93*  CO2 26 26 21 20   GLUCOSE 116* 111* 102* 131*  BUN 17 12 18 12   CREATININE 1.05 0.87 1.17* 0.98  CALCIUM 8.9 9.3 9.2 9.1  MG  --   --   --  1.7     Coagulation profile  Recent Labs Lab 04/20/14 0725  INR 1.06    Urine Studies No results found for this basename: UACOL, UAPR, USPG, UPH, UTP, UGL, UKET, UBIL, UHGB, UNIT, UROB, ULEU, UEPI, UWBC, URBC, UBAC, CAST, CRYS, UCOM, BILUA,  in the last 72 hours  Studies:      Ct Abdomen Pelvis Wo  Contrast  04/17/2014   COMPARISON:  Partial comparison to CT chest dated 04/14/2014 and 10/31/2011  FINDINGS: Small left pleural effusion, possibly partially loculated. 7 x 11 mm left lower lobe nodule (series 4/image 2).  Cardiomegaly.  Unenhanced liver, spleen, and right adrenal gland are within normal limits.  Pancreas is notable for a coarse calcification in the pancreatic head/uncinate process (series 2/image 31), nonspecific, but likely incidental.  3.5 x 2.3 cm left adrenal mass (series 2/ image 19), unchanged since 2012. While technically indeterminate, stability favors a benign etiology such as an adrenal adenoma.  Layering gallstones (series 2/ image 33), without associated inflammatory changes. No intrahepatic or extrahepatic ductal dilatation.  Scattered bilateral renal cysts, including a 2.4 x 3.4 cm mildly irregular cyst in the left lower pole (series 2/image 34). Additional 13 mm probable hemorrhagic cysts in the left lower pole (series 2/image 37). No renal calculi or hydronephrosis.  No evidence of bowel obstruction. 1.9 x 4.3 x 4.6 cm eccentric cecal mass (series 2/image 55), worrisome for primary chronic neoplasm. Extension into the surrounding pericolonic fat is possible. 5 mm in the common node (series 2/image 48), within normal limits for size. Diverticulosis, without associated inflammatory changes.  Atherosclerotic calcifications of the abdominal aorta and branch vessels.  No abdominopelvic ascites.  Small retroperitoneal lymph nodes which do not meet pathologic CT size criteria.  Uterus and bilateral ovaries are unremarkable.  Bladder is decompressed with indwelling Foley catheter and moderate nondependent gas.  Degenerative changes of the visualized thoracolumbar spine. Prior bilateral inferior pubic ramus fractures. Right total hip arthroplasty. Mild degenerative changes of the left hip.  IMPRESSION: 4.6 cm eccentric cecal mass, worrisome for primary colonic neoplasm. Colonoscopy is  suggested for further evaluation.  7 x 11 mm left lower lobe nodule, metastasis not excluded.  3.5 cm left adrenal mass, unchanged since 2012, likely reflecting a benign adrenal adenoma.  Small left pleural effusion, possibly partially loculated.  Additional ancillary findings as above.   Electronically Signed   By: Julian Hy M.D.   On: 04/17/2014 09:58    Ct Angio Chest Pe W/cm &/or Wo Cm  04/14/2014  COMPARISON:  10/31/2011  FINDINGS: THORACIC INLET/BODY WALL:  No acute abnormality.  MEDIASTINUM:  Mild cardiomegaly. No pericardial effusion. Diffuse atherosclerotic calcification, including the coronary arteries. There is intermittent respiratory motion, which limits sensitivity for detecting pulmonary embolus at the affected levels. Exam is overall diagnostic, and there is no pulmonary embolism identified. No aortic aneurysm or dissection. No lymphadenopathy. Negative esophagus.  LUNG WINDOWS:  There is a small, layering left pleural effusion. The fluid is water density. There is mild associated atelectasis. Although limited by motion artifact, there is a nodule in the upper lingula which measures 7 mm. 13 nodular density in the posterior basilar segment left lower lobe, subpleural. In the medial basilar segment left lower lobe on image 52, there is a 12 mm nodule. Centrilobular emphysema.  UPPER ABDOMEN:  A soft tissue density nodule arising from the left adrenal gland is stable in size, up to 3.5 cm. Stability is consistent with benign lesion, most likely lipid poor adenoma.  OSSEOUS:  No acute fracture.  No suspicious lytic or blastic lesions.  Review of the MIP images confirms the above findings.  IMPRESSION: 1. Negative for pulmonary embolism. 2. Small left pleural effusion. 3. Three left lung nodules, measuring up to 13 mm. Bronchogenic carcinoma is a  diagnostic possibility, recommend follow-up chest CT in 3 months. 4. Emphysema.   Electronically Signed   By: Jorje Guild M.D.   On: 04/14/2014  21:12   Dg Chest Port 1 View  04/22/2014   CLINICAL DATA:  Short of breath.  Rule out infiltrate  EXAM: PORTABLE CHEST - 1 VIEW  COMPARISON:  04/14/2014  FINDINGS: No change in bibasilar linear densities consistent with atelectasis. Increased left pleural effusion. Negative for edema or pneumonia.  IMPRESSION: Bibasilar atelectasis unchanged.  Increase in small left effusion   Electronically Signed   By: Franchot Gallo M.D.   On: 04/22/2014 07:23   Patient: BREEONNA, MONE Collected: 04/19/2014 [facility] Accession: [id number] Received: 04/19/2014 Erskine Emery DOB: 05-Dec-1926 Age: 37 Gender: F Reported: 04/20/2014 [address] Patient Ph: [phone] MRN #: [id number] [place], [place] [address] Visit #: [id number] Chart #: Phone: [phone] Fax: CC: Faye Ramsay, MD  RCOLON FINDINGS: In the cecum there was a large, exophytic, bleeding mass encompassing the ileocecal valve and at least one half the circumference of the cecum. Multiple biopsies were taken.A single sessile 2 mm polyp was seen in the cecum. There was a sessile 2 mm polyp in the proximal ascending colon. Moderate diverticulosis was noted in the sigmoid colon. There was mild scattered diverticulosis noted in the descending colon, transverse colon,and ascending colon. In the proximal sigmoid colon there was a 3 mm hyperplastic-appearing polyp. In the proximal sigmoid colon there was a 3 mm hyperplastic-appearing polyp. Retroflexed views revealed no abnormalities. The time to cecum= . Withdrawal time=13 minutes 0 seconds. The scope was withdrawn and the procedure completed.EPORT OF SURGICAL PATHOLOGY FINAL DIAGNOSIS Diagnosis Colon, biopsy, cecal mass bx - ADENOCARCINOMA, PLEASE SEE COMMENT. Microscopic Comment Dr. Gari Crown agrees. The case was discussed with Dr. Deatra Ina on 04/20/2014. (HCL:kh 04/20/14) Aldona Bar MD Pathologist, Electronic Signature (Case signed 04/20/2014  Assessmnent/Plan:80 y.o. female with  :  1. Adenocarcinoma of the colon  2.  Lung masses of unknown etiology Diagnosis of adenocarcinoma was obtained by colonoscopy on 6/8, from cecal mass. Partial Colectomy  was planned once pulmonary and cardiac clearance were obtained. She will need to be stronger to tolerate such procedure.  Recommend to improve her Iron deficiency anemia with supplementation. A PET scan will be performed as outpatient to determine the true nature of lung masses as well as to rule out other metastatic disease. A Follow up outpatient visit will be scheduled with Dr. Benay Spice once PET scan has been performed.   3. Anemia -likely iron deficiency anemia  4. Remote history of tobacco abuse  4. Full Code  Other medical issues as per admitting team.  Rondel Jumbo, PA-C 04/22/2014   Ms. Hornberger was interviewed and examined. CTs were reviewed. Impression: 1. Adenocarcinoma of the cecum, elevated CEA 2. left-sided lung nodules-? Metastases,? Bronchogenic carcinoma 3. atrial fibrillation 4. NSTEMI 5. COPD 6. Hypertension 7. Pneumonia 8. Dementia 9. iron deficiency anemia   Ms. Larmon  has been diagnosed with colon cancer. She has multiple comorbid conditions. I discussed treatment options with Ms. Kauth and her family. She appears to have iron deficiency anemia related to the cancer, but is otherwise asymptomatic. No gross bleeding.  The lung nodules may indicate metastatic colon cancer. In this case no therapy would be curative. She does not appear to be a candidate for systemic chemotherapy. I explained that most patients with metastatic colon cancer do not require removal of the primary tumor unless they develop obstruction, brisk bleeding, or  tumor related pain.  I recommend an outpatient PET scan to better define the lung lesions and look for other evidence of occult metastatic disease. If she appears to have metastatic colon cancer options will include observation with iron therapy and palliative  surgery.  I will discuss the case with Dr. Zella Richer.  Recommendations: 1. Continue ferrous sulfate and stool softener 2. Outpatient PET scan 3. Outpatient followup at the [facility]   I will check on her in the a.m. on 04/23/2014. I will then be out until 04/26/2014. Please call oncology as needed. I will arrange for outpatient followup.

## 2014-04-22 NOTE — Evaluation (Signed)
Clinical/Bedside Swallow Evaluation Patient Details  Name: Jenny [name] MRN: [id number] Date of Birth: [birth year]  Today's Date: 04/22/2014 Time: 8127-5170 SLP Time Calculation (min): 30 min  Past Medical History:  Past Medical History  Diagnosis Date  . Hypertension   . Coronary artery disease   . Heart murmur   . Hypothyroidism 05-02-12    tx. Synthroid  . Headache(784.0) 05-02-12    past hx. migrianes-many yrs ago  . Arthritis 05-02-12    osteoarthritis-rt. hip  . Pneumonia 05-02-12    mild case  . 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 Acute upper respiratory infections of unspecified site 2014  . Unspecified venous (peripheral) insufficiency 2013  . Senile osteoporosis 2013  . Unspecified closed fracture of pelvis 10/13/2012  . Cellulitis and abscess of leg, except foot 2013  . Altered mental status 2013  . Chronic pain syndrome 2013  . Urinary tract infection, site not specified   . Hyposmolality and/or hyponatremia 05/2012  . Congestive heart failure, unspecified 05/2012  . Pneumonia, organism unspecified 05/2012  . Pressure ulcer, buttock(707.05) 05/2012  . Hypoxemia 05/2012  . Reflux esophagitis 3013  . Unspecified constipation 2013  . Acute posthemorrhagic anemia 05/2012  . Coronary atherosclerosis of native coronary artery 05/2012  . Pain in joint, pelvic region and thigh 04/2012  . Dysphagia, unspecified(787.20) 03/2012  . Benign neoplasm of adrenal gland 10/2011  . Alzheimer's disease 06/2011  . Other seborrheic keratosis 2012  . Hypertensive renal disease, benign 2012  . Pain in joint, lower leg 2012  . Memory loss 09/2010  . Spinal stenosis, lumbar region, without neurogenic claudication 2011  . Unspecified vitamin D deficiency 2010  . Unspecified disorder of kidney and ureter 2010  . Edema 2010  . Other malaise and fatigue 2009  . Congenital factor VIII disorder 2009  . Senile dementia with delusional features 2008  . Generalized hyperhidrosis 2007  . Irritable  bowel syndrome 2007  . Unspecified chronic bronchitis 2006  . Depression 06/2005     depression only,tx. oral meds  . Shortness of breath 2005  . Anxiety 2004  . Unspecified late effects of cerebrovascular disease 2004  . Migraine with aura, without mention of intractable migraine without mention of status migrainosus 2003  . Tinnitus 2003  . Other symptoms involving cardiovascular system 2003  . Hyperlipidemia 2002   Past Surgical History:  Past Surgical History  Procedure Laterality Date  . Tonsillectomy    . Tubal ligation  05-02-12  .  tummy tuck  05-02-12    many yrs ago  . Cataract extraction, bilateral  05-02-12    bilateral  . Retinal detachment surgery  05-02-12    left  . Total hip arthroplasty  05/09/2012    Procedure: TOTAL HIP ARTHROPLASTY ANTERIOR APPROACH;  Surgeon: Mcarthur Rossetti, MD;  Location: [facility];  Service: Orthopedics;  Laterality: Right;  . Dental surgery  08/11/13    3 teeth surgical removed by Dr. Lewanda Rife  . Esophagogastroduodenoscopy N/A 04/16/2014    Procedure: ESOPHAGOGASTRODUODENOSCOPY (EGD);  Surgeon: Lafayette Dragon, MD;  Location: [facility];  Service: Endoscopy;  Laterality: N/A;  . Colonoscopy N/A 04/19/2014    Procedure: COLONOSCOPY;  Surgeon: [name], MD;  Location: [facility];  Service: Endoscopy;  Laterality: N/A;   HPI:  80 year old female admitted 04/13/14 due to SOB. PMH significant for NSTEMI, dementia, PNA (1/15), reflux esophagitis, dysphagia. CXR 04/22/14 indicated BLL atelectasis. EGD indicated presbyesophagus.   Assessment / Plan / Recommendation Clinical Impression  Pt was observed during lunch meal of soft solids/puree, and thin liquids. Pt able to demonstrate strong volitional cough. Oral motor strength and function appear adequate. Pt has adequate dentition, however, molars are missing. No overt s/s aspiration observed with any consistency tested. Pt and daughter report pt gets choked when distracted or rushed. Pt was  observed to take small bites and sips at a slow rate, and appeared to tolerate all consistencies. Pt recently underwent MBS (02/18/14). These results and recommendations were reviewed with pt/daughter, and appear to still be appropriate. Safe swallow precautions will be provided to and reviewed with pt/family. At this time, repeat objective study is not recommended, but remains an option if needs arise. Daughter in agreement.    Aspiration Risk  Mild    Diet Recommendation Dysphagia 3 (Mechanical Soft);Thin liquid   Liquid Administration via: Cup;Straw Medication Administration: Whole meds with puree Supervision: Patient able to self feed;Intermittent supervision to cue for compensatory strategies Compensations: Small sips/bites;Slow rate;Follow solids with liquid Postural Changes and/or Swallow Maneuvers: Seated upright 90 degrees;Upright 30-60 min after meal    Other  Recommendations Oral Care Recommendations: Oral care Q4 per protocol   Follow Up Recommendations  24 hour supervision/assistance    Frequency and Duration        Pertinent Vitals/Pain VSS, no pain reported    SLP Swallow Goals  n/a   Swallow Study Prior Functional Status   History of dysphagia, with completion of MBS 02/18/14. Results and recommendations from that study continue appropriate.    General Date of Onset: 04/13/14 HPI: 80 year old female admitted 04/13/14 due to SOB. PMH significant for NSTEMI, dementia, PNA (1/15), reflux esophagitis, dysphagia. CXR 04/22/14 indicated BLL atelectasis. EGD indicated presbyesophagus. Type of Study: Bedside swallow evaluation Previous Swallow Assessment: MBS 02/18/14 revealed mild oropharyngeal dysphagia. Diet Prior to this Study: Dysphagia 3 (soft);Thin liquids Temperature Spikes Noted: No Respiratory Status: Room air History of Recent Intubation: No Behavior/Cognition: Alert;Cooperative;Pleasant mood Oral Cavity - Dentition: Adequate natural dentition;Missing  dentition Self-Feeding Abilities: Able to feed self Patient Positioning: Upright in chair Baseline Vocal Quality: Clear Volitional Cough: Strong Volitional Swallow: Able to elicit    Oral/Motor/Sensory Function Overall Oral Motor/Sensory Function: Appears within functional limits for tasks assessed   Ice Chips Ice chips: Not tested   Thin Liquid Thin Liquid: Within functional limits Presentation: Straw;Cup    Nectar Thick Nectar Thick Liquid: Not tested   Honey Thick Honey Thick Liquid: Not tested   Puree Puree: Within functional limits Presentation: [place], [facility], CCC-SLP [phone] Solid: Within functional limits Presentation: [place], Celia Brown 04/22/2014,1:31 PM

## 2014-04-23 ENCOUNTER — Telehealth: Payer: Self-pay | Admitting: Oncology

## 2014-04-23 ENCOUNTER — Other Ambulatory Visit: Payer: Self-pay | Admitting: Nurse Practitioner

## 2014-04-23 DIAGNOSIS — C189 Malignant neoplasm of colon, unspecified: Secondary | ICD-10-CM

## 2014-04-23 LAB — BASIC METABOLIC PANEL
BUN: 18 mg/dL (ref 6–23)
BUN: 17 mg/dL (ref 6–23)
CALCIUM: 8.8 mg/dL (ref 8.4–10.5)
CO2: 22 meq/L (ref 19–32)
CO2: 18 meq/L — AB (ref 19–32)
Calcium: 8.4 mg/dL (ref 8.4–10.5)
Chloride: 81 mEq/L — ABNORMAL LOW (ref 96–112)
Chloride: 83 mEq/L — ABNORMAL LOW (ref 96–112)
Creatinine, Ser: 1.24 mg/dL — ABNORMAL HIGH (ref 0.50–1.10)
Creatinine, Ser: 1.1 mg/dL (ref 0.50–1.10)
GFR calc Af Amer: 45 mL/min — ABNORMAL LOW (ref >90)
GFR calc Af Amer: 52 mL/min — ABNORMAL LOW (ref >90)
GFR, EST NON AFRICAN AMERICAN: 39 mL/min — AB (ref >90)
GFR, EST NON AFRICAN AMERICAN: 45 mL/min — AB (ref >90)
GLUCOSE: 203 mg/dL — AB (ref 70–99)
GLUCOSE: 219 mg/dL — AB (ref 70–99)
Potassium: 4.7 mEq/L (ref 3.7–5.3)
Potassium: 4 mEq/L (ref 3.7–5.3)
SODIUM: 117 meq/L — AB (ref 137–147)
Sodium: 116 mEq/L — CL (ref 137–147)

## 2014-04-23 LAB — CBC
HCT: 31.8 % — ABNORMAL LOW (ref 36.0–46.0)
Hemoglobin: 10.7 g/dL — ABNORMAL LOW (ref 12.0–15.0)
MCH: 27.8 pg (ref 26.0–34.0)
MCHC: 33.6 g/dL (ref 30.0–36.0)
MCV: 82.6 fL (ref 78.0–100.0)
PLATELETS: 467 10*3/uL — AB (ref 150–400)
RBC: 3.85 MIL/uL — ABNORMAL LOW (ref 3.87–5.11)
RDW: 13.5 % (ref 11.5–15.5)
WBC: 16.5 10*3/uL — ABNORMAL HIGH (ref 4.0–10.5)

## 2014-04-23 LAB — EXPECTORATED SPUTUM ASSESSMENT W GRAM STAIN, RFLX TO RESP C: Special Requests: NORMAL

## 2014-04-23 LAB — EXPECTORATED SPUTUM ASSESSMENT W REFEX TO RESP CULTURE

## 2014-04-23 MED ORDER — VANCOMYCIN HCL IN DEXTROSE 1-5 GM/200ML-% IV SOLN
1000.0000 mg | INTRAVENOUS | Status: DC
  Administered 2014-04-24: 1000 mg via INTRAVENOUS
  Filled 2014-04-23: qty 200

## 2014-04-23 MED ORDER — DEXTROSE 5 % IV SOLN
1.0000 g | Freq: Two times a day (BID) | INTRAVENOUS | Status: DC
  Administered 2014-04-23 – 2014-04-25 (×4): 1 g via INTRAVENOUS
  Filled 2014-04-23 (×5): qty 1

## 2014-04-23 MED ORDER — VANCOMYCIN HCL 10 G IV SOLR
1500.0000 mg | Freq: Once | INTRAVENOUS | Status: AC
  Administered 2014-04-23: 1500 mg via INTRAVENOUS
  Filled 2014-04-23: qty 1500

## 2014-04-23 MED ORDER — SODIUM CHLORIDE 0.9 % IV BOLUS (SEPSIS)
250.0000 mL | Freq: Once | INTRAVENOUS | Status: AC
  Administered 2014-04-23: 15:00:00 via INTRAVENOUS

## 2014-04-23 MED ORDER — SODIUM CHLORIDE 0.9 % IV SOLN
INTRAVENOUS | Status: DC
  Administered 2014-04-24 (×2): via INTRAVENOUS

## 2014-04-23 MED ORDER — BOOST / RESOURCE BREEZE PO LIQD
1.0000 | Freq: Three times a day (TID) | ORAL | Status: DC
  Administered 2014-04-23 – 2014-04-24 (×2): 1 via ORAL

## 2014-04-23 MED ORDER — DILTIAZEM HCL ER COATED BEADS 180 MG PO CP24
180.0000 mg | ORAL_CAPSULE | Freq: Every day | ORAL | Status: DC
  Administered 2014-04-24 – 2014-04-26 (×3): 180 mg via ORAL
  Filled 2014-04-23 (×4): qty 1

## 2014-04-23 NOTE — Progress Notes (Signed)
CRITICAL VALUE ALERT  Critical value received:  Na 116  Date of notification:  04/23/2014  Time of notification:  2683  Critical value read back:yes  Nurse who received alert:  Lawernce Ion, RN  MD notified (1st page):  Dr. Tyrell Antonio  Time of first page:  1321  MD notified (2nd page):  Time of second page:  Responding MD:  Dr. Tyrell Antonio  Time MD responded:  [phone]

## 2014-04-23 NOTE — Progress Notes (Signed)
ANTIBIOTIC CONSULT NOTE - INITIAL  Pharmacy Consult for Vancomycin, Cefepime Indication: pneumonia  Allergies  Allergen Reactions  . Levaquin [Levofloxacin In D5w] Hives  . Penicillins Rash    Patient Measurements: Height: 5\' 4"  (162.6 cm) Weight: 146 lb 8 oz (66.452 kg) IBW/kg (Calculated) : 54.7  Vital Signs: Temp: 97.8 F (36.6 C) (06/12 1351) Temp src: Oral (06/12 1351) BP: 87/47 mmHg (06/12 1351) Pulse Rate: 102 (06/12 1351) Intake/Output from previous day: 06/11 0701 - 06/12 0700 In: 840 [P.O.:840] Out: 750 [Urine:750]  Labs:  Recent Labs  04/21/14 0830 04/23/14 1325 04/23/14 1329  WBC  --  16.5*  --   HGB  --  10.7*  --   PLT  --  467*  --   CREATININE 0.98  --  1.24*   Estimated Creatinine Clearance: 31.7 ml/min (by C-G formula based on Cr of 1.24).   Microbiology: Recent Results (from the past 720 hour(s))  MRSA PCR SCREENING     Status: None   Collection Time    04/13/14  7:57 PM      Result Value Ref Range Status   MRSA by PCR NEGATIVE  NEGATIVE Final   Comment:            The GeneXpert MRSA Assay (FDA     approved for NASAL specimens     only), is one component of a     comprehensive MRSA colonization     surveillance program. It is not     intended to diagnose MRSA     infection nor to guide or     monitor treatment for     MRSA infections.  URINE CULTURE     Status: None   Collection Time    04/14/14  6:33 AM      Result Value Ref Range Status   Specimen Description URINE, CATHETERIZED   Final   Special Requests NONE   Final   Culture  Setup Time     Final   Value: 04/14/2014 11:34     Performed at [facility]     Final   Value: NO GROWTH     Performed at [facility]   Culture     Final   Value: NO GROWTH     Performed at [facility]   Report Status 04/15/2014 FINAL   Final  CULTURE, EXPECTORATED SPUTUM-ASSESSMENT     Status: None   Collection Time    04/23/14  8:20 AM      Result  Value Ref Range Status   Specimen Description SPUTUM   Final   Special Requests Normal   Final   Sputum evaluation     Final   Value: THIS SPECIMEN IS ACCEPTABLE. RESPIRATORY CULTURE REPORT TO FOLLOW.   Report Status 04/23/2014 FINAL   Final  CULTURE, RESPIRATORY (NON-EXPECTORATED)     Status: None   Collection Time    04/23/14  8:20 AM      Result Value Ref Range Status   Specimen Description SPUTUM   Final   Special Requests NONE   Final   Gram Stain     Final   Value: FEW WBC PRESENT,BOTH PMN AND MONONUCLEAR     RARE SQUAMOUS EPITHELIAL CELLS PRESENT     RARE GRAM POSITIVE COCCI     IN PAIRS RARE GRAM NEGATIVE RODS     Performed at [facility]   Culture PENDING   Incomplete   Report Status  PENDING   Incomplete    Anti-infectives: 5/27 >> Cefuroxime >> 6/8 6/12 >> Vanc >> 6/12 >> Cefepime >>    Assessment: 10 yoF admitted 6/2 with SOB, found to have NSTEMI, afib, anemia and newly diagnosed colon cancer.  She has extensive PMH including CHF, HTN, HLD, CAD, dementia, and started on PO cefuroxime for pneumonia.  This was continued from home on admission until 6/8.  Pharmacy is now consulted to dose Vancomycin and Cefepime for HCAP.  Noted Levaquin (hives) and PCN (rash) allergies, but she tolerated cephalosporin recently and should also tolerate cefepime.  Tmax: 98.8  WBCs: 16.5  Renal: SCr 1.24 (0.98 on admit), CrCl ~ 32 ml/min   Goal of Therapy:  Vancomycin trough level 15-20 mcg/ml  Plan:   Cefepime 1g IV q12h  Vancomycin 1500mg  IV once, then 1g IV q24h.  Measure Vanc trough at steady state.  Follow up renal fxn and culture results.  Gretta Arab PharmD, BCPS Pager [phone] 04/23/2014 3:15 PM

## 2014-04-23 NOTE — Progress Notes (Signed)
Brief Cardiology Note  Jenny [name] went back into atrial fibrillation - albeit asymptomatic. Will given additional dose of short-acting diltiazem and then increase next overnight dose to 45 mg. Will defer to primary team but AM dose of long-acting diltiazem should probably be increased.   Jules Husbands, MD

## 2014-04-23 NOTE — Progress Notes (Signed)
Patient remained on atrial fib with HR at 90-110's. Ptient remained asymptomatic. Dr. Claiborne Billings was made aware. Another single dose of cardizem po given. Will continue to monitor patient.

## 2014-04-23 NOTE — Progress Notes (Signed)
Patient's heart became irregular from NSR converted to atrial fib with RVR, confirmed with EKG.  HR at 110-140's, patient is asymptomatic, BP is stable. Dr. Claiborne Billings, [place] Nasuti was made aware. Single dose of cardizem 30 mg given. Will continue to monitor patient.

## 2014-04-23 NOTE — Telephone Encounter (Signed)
6/12 pof forwarded to Tiffany in HIM - hosp/new pt. central will contact pt w/pet once she is d/c.

## 2014-04-23 NOTE — Progress Notes (Signed)
Patient ID: Jenny [name]  female  BBC:[id number]    DOB: 1927/04/29    DOA: 04/13/2014  PCP: Estill Dooms, MD  hospitalization course 80 yo female with multiple and complex medical problems including HTN, HLD, dementia, CHF, CAD, and hypothyroidism, who presented to [facility] ED with main concern of several days duration of progressively worsening fatigue, lethargy, and shortness of breath. In the ED, pt was noted to be in atrial fibrillation with HR in 150's, initial troponin elevated at 1.76. Na 122.   Patient was admitted to the stepdown unit, and placed on a Cardizem drip. Cardiology was consulted.  The patient was noted to have NSTEMI with elevated troponin. A nuclear medicine stress test 6/5 showed a moderate sized, partially reversible inferolateral perfusion defect, consistent with ischemia, EF 54%. Patient also developed anemia during the hospitalization.  Cardiology recommended an anemia workup prior to cardiac cath. EGD was normal. CT of the abdomen and pelvis revealed a cecal mass, and the patient underwent coloscopy on 6/8 which showed a large exophytic bleeding mass encompassing the ileocecal valve. General surgery was consulted.   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 Biopsy positive for adenocarcinoma -Oncology consulted. Plan to have PT Scan fist prior to any surgical intervention.   Hypotension: suspect is multifactorial , medications, (Cardizem increase today), decrease volume. will hold lasix, IV bolus, start IV fluids.   Hyponatremia; Might explain weakness. Will start IV fluids. Will repeat B-met tonight. Monitor for pulmonary edema.   Bronchitis, chronic//PNA - Recently treated for pneumonia,  continue Xopenex nebs, Mucinex  - has completed a course of Ceftin  -Will order sputum culture. Schedule guaifenesin. Incentive spirometry.  --On Dysphagia 3 diet.  -WBC increase, cough persist, will start Vancomycin and Cefepime to cover for PNA.   Atrial fibrillation with RVR: Paroxysmal - Heart rate now again controlled, currently in normal sinus rhythm, Cardizem drip being rapidly weaned to off  - Not on any anticoagulation at this time due to anemia, newly diagnosed colon cancer - CHADSVASC is 5 so would like to initiate anticoag as soon as safely feasible  - Echo done outpatient EF 65%. LV ef normal, no wma, no valvular abn   -TSH normal,  d-dimer was elevated, CT angiography chest negative for pulmonary embolism -Had episode of A fib RVR overnight, Cardizem was increased. HR now in the 80.    NSTEMI  -No chest pain, Troponins trending down. -Stress Myoview test showed moderate size and intensity, partially reversible inferolateral perfusion defect (SDS 4), consistent with ischemia - Extent 19%. LVEF 54%, normal wall motion -Cardiac cath revealed no signif coronary lesion   Acute on chronic diastolic CHF -CHF likely precipitated due to rapid A. Fib - appears well compensated at this time  -will hold lasix due to hypotension and hyponatremia.   Hypothyroidism - TSH 1.69, continue Synthroid  Anxiety, depression  Bipolar I disorder - Continue Klonopin per her home regimen, continue Zoloft  GERD  -continue PPI  Chronic dysphagia - Stable   DVT Prophylaxis: SCDs  Code Status: FULL   Family Communication:  Care discussed with multiple family members.   Disposition: To be determine. Oncology consulted.   Consultants:  Cardiology   GI  General surgery  Oncology  Antibiotics:  Ceftin 6/2 >> 6/7  Vancomycin  Cefepime.   Subjective: Patient is not feeling well, she is feeling weak and tire. She has not been eating well. She is nauseous. She had a BM.    Objective: Weight change: -0.045 kg (-1.6 oz)  Intake/Output Summary (Last 24 hours) at 04/23/14 1448 Last data filed at 04/23/14 1310  Gross per 24 hour  Intake    540 ml  Output    150 ml  Net    390 ml   Blood pressure 87/47, pulse 102, temperature 97.8 F (36.6 C), temperature source Oral, resp. rate 20, height 5' 4"  (1.626 m), weight 66.452 kg (146 lb 8 oz), SpO2 95.00%.  Physical Exam: General: no acute respiratory distress CVS: RRR no appreciable M or rub  Chest: bilateral fine crackles.  Abdomen: soft, nontender, nondistended, normal bowel sounds  Extremities: no cyanosis, clubbing or edema noted bilaterally  Lab Results: Basic Metabolic Panel:  Recent Labs Lab 04/21/14 0830 04/23/14 1329  NA 128* 116*  K 5.3 4.7  CL 93* 81*  CO2 20 22  GLUCOSE 131* 203*  BUN 12 18  CREATININE 0.98 1.24*  CALCIUM 9.1 8.8  MG 1.7  --    Liver Function Tests: No results found for this basename: AST, ALT, ALKPHOS, BILITOT, PROT, ALBUMIN,  in the last 168 hours  CBC:  Recent Labs Lab 04/19/14 0350 04/23/14 1325  WBC 9.2 16.5*  HGB 11.2* 10.7*  HCT 33.9* 31.8*  MCV 85.4 82.6  PLT 508* 467*    Micro Results: Recent Results (from the past 240 hour(s))  MRSA PCR SCREENING     Status: None   Collection Time    04/13/14  7:57 PM      Result Value Ref Range Status   MRSA by PCR NEGATIVE  NEGATIVE Final   Comment:            The GeneXpert MRSA Assay (FDA     approved for NASAL specimens     only), is one component of a     comprehensive MRSA colonization     surveillance program. It is not     intended to diagnose MRSA     infection nor to guide or     monitor treatment for     MRSA infections.  URINE CULTURE     Status: None   Collection Time    04/14/14  6:33 AM      Result Value Ref Range Status   Specimen Description URINE, CATHETERIZED   Final   Special Requests NONE   Final   Culture  Setup Time     Final   Value: 04/14/2014 11:34     Performed at  [place]     Final   Value: NO GROWTH     Performed at [facility]   Culture     Final   Value: NO GROWTH     Performed at [facility]   Report Status 04/15/2014 FINAL   Final  CULTURE, EXPECTORATED SPUTUM-ASSESSMENT     Status: None   Collection Time    04/23/14  8:20 AM      Result Value Ref Range Status   Specimen Description SPUTUM   Final   Special Requests Normal   Final   Sputum evaluation     Final   Value: THIS SPECIMEN IS ACCEPTABLE. RESPIRATORY CULTURE REPORT TO FOLLOW.   Report  Status 04/23/2014 FINAL   Final  CULTURE, RESPIRATORY (NON-EXPECTORATED)     Status: None   Collection Time    04/23/14  8:20 AM      Result Value Ref Range Status   Specimen Description SPUTUM   Final   Special Requests NONE   Final   Gram Stain     Final   Value: FEW WBC PRESENT,BOTH PMN AND MONONUCLEAR     RARE SQUAMOUS EPITHELIAL CELLS PRESENT     RARE GRAM POSITIVE COCCI     IN PAIRS RARE GRAM NEGATIVE RODS     Performed at [facility]   Culture PENDING   Incomplete   Report Status PENDING   Incomplete   Medications: Scheduled Meds: . atorvastatin  10 mg Oral q1800  . brimonidine  1 drop Both Eyes Q12H   And  . timolol  1 drop Both Eyes Q12H  . calcitonin (salmon)  1 spray Alternating Nares Daily  . calcium-vitamin D  1 tablet Oral TID  . clonazePAM  0.5 mg Oral QHS  . diltiazem  180 mg Oral Daily  . feeding supplement (ENSURE COMPLETE)  237 mL Oral BID BM  . feeding supplement (RESOURCE BREEZE)  1 Container Oral TID BM  . ferrous sulfate  325 mg Oral BID WC  . guaiFENesin  400 mg Oral BID  . lamoTRIgine  200 mg Oral Daily  . levothyroxine  88 mcg Oral QAC breakfast  . metoprolol tartrate  25 mg Oral 3 times per day  . pantoprazole  40 mg Oral Daily  . pilocarpine  5 mg Oral TID  . saccharomyces boulardii  250 mg Oral BID  . senna  2 tablet Oral QHS  . sertraline  25 mg Oral Daily   Time spent:  30 mins   LOS: 10  days   [place]  [facility] For Consults/Admissions Hydrologist - [phone] Office  [phone] Pager [phone]  On-Call/Text Page:      [email or web address]      password [facility]  04/23/2014, 2:48 PM

## 2014-04-23 NOTE — Progress Notes (Signed)
Called re: hypotension  BP 87/47  Pulse 102  Temp(Src) 97.8 F (36.6 C) (Oral)  Resp 20  Ht 5\' 4"  (1.626 m)  Wt 146 lb 8 oz (66.452 kg)  BMI 25.13 kg/m2  SpO2 95%  SBP 80s-70s, IM has ordered fluid bolus 250 cc  Discussed w/ RN. Pt has hyponatremia, hypochloremia and increased Cr by BMET this pm. See results below. EF 54% by nuc stress test, prev normal by echo.   Reviewed data w/ RN. OK to give IVF. Will add parameters on metoprolol but no dose change as once her volume status improves, her BP should tolerate the BB. HR is controlled. Will make sure CBC and BMET ordered for am.     Recent Labs Lab 04/23/14 1329  NA 116*  K 4.7  CL 81*  CO2 22  BUN 18  CREATININE 1.24*  CALCIUM 8.8  GLUCOSE 203*

## 2014-04-23 NOTE — Progress Notes (Signed)
3 Days Post-Op  Subjective: Not doing too well today.  Continues to cough a lot.  No abdominal pain.  I spoke with Dr. Benay [name] about her.  Objective: Vital signs in last 24 hours: Temp:  [97.8 F (36.6 C)-98.8 F (37.1 C)] 97.8 F (36.6 C) (06/12 1351) Pulse Rate:  [84-112] 102 (06/12 1351) Resp:  [20] 20 (06/12 1351) BP: (87-155)/(41-91) 87/47 mmHg (06/12 1351) SpO2:  [93 %-95 %] 95 % (06/12 1351) Weight:  [146 lb 8 oz (66.452 kg)] 146 lb 8 oz (66.452 kg) (06/12 0500) Last BM Date: 04/23/14  Intake/Output from previous day: 06/11 0701 - 06/12 0700 In: 840 [P.O.:840] Out: 750 [Urine:750] Intake/Output this shift: Total I/O In: 180 [P.O.:180] Out: -   PE: General- In NAD, coughing Abdomen-soft, nontender  Lab Results:   Recent Labs  04/23/14 1325  WBC 16.5*  HGB 10.7*  HCT 31.8*  PLT 467*   BMET  Recent Labs  04/21/14 0830 04/23/14 1329  NA 128* 116*  K 5.3 4.7  CL 93* 81*  CO2 20 22  GLUCOSE 131* 203*  BUN 12 18  CREATININE 0.98 1.24*  CALCIUM 9.1 8.8   PT/INR No results found for this basename: LABPROT, INR,  in the last 72 hours Comprehensive Metabolic Panel:    Component Value Date/Time   NA 116* 04/23/2014 1329   NA 128* 04/21/2014 0830   NA 117* 04/13/2014   NA 124* 04/08/2014   K 4.7 04/23/2014 1329   K 5.3 04/21/2014 0830   CL 81* 04/23/2014 1329   CL 93* 04/21/2014 0830   CO2 22 04/23/2014 1329   CO2 20 04/21/2014 0830   BUN 18 04/23/2014 1329   BUN 12 04/21/2014 0830   BUN 12 04/13/2014   BUN 15 04/08/2014   CREATININE 1.24* 04/23/2014 1329   CREATININE 0.98 04/21/2014 0830   CREATININE 1.0 04/13/2014   CREATININE 1.1 04/08/2014   GLUCOSE 203* 04/23/2014 1329   GLUCOSE 131* 04/21/2014 0830   CALCIUM 8.8 04/23/2014 1329   CALCIUM 9.1 04/21/2014 0830   AST 21 04/13/2014 1519   AST 15 02/02/2014   ALT 15 04/13/2014 1519   ALT 9 02/02/2014   ALKPHOS 92 04/13/2014 1519   ALKPHOS 69 02/02/2014   BILITOT <0.2* 04/13/2014 1519   BILITOT <0.2* 11/16/2013 0502   PROT 6.4 04/13/2014 1519   PROT 6.0 11/16/2013 0502   ALBUMIN 3.1* 04/13/2014 1519   ALBUMIN 2.3* 11/20/2013 0534     Studies/Results: Dg Chest Port 1 View  04/22/2014   CLINICAL DATA:  Short of breath.  Rule out infiltrate  EXAM: PORTABLE CHEST - 1 VIEW  COMPARISON:  04/14/2014  FINDINGS: No change in bibasilar linear densities consistent with atelectasis. Increased left pleural effusion. Negative for edema or pneumonia.  IMPRESSION: Bibasilar atelectasis unchanged.  Increase in small left effusion   Electronically Signed   By: Franchot Gallo M.D.   On: 04/22/2014 07:23    Anti-infectives: Anti-infectives   Start     Dose/Rate Route Frequency Ordered Stop   04/13/14 2000  cefUROXime (CEFTIN) tablet 500 mg  Status:  Discontinued     500 mg Oral 2 times daily with meals 04/13/14 1840 04/19/14 0951      Assessment  Jenny [name]-non-obstructing   Atrial fibrillation with RVR   CAP-continues to have a productive cough, [name] 04/23/2014

## 2014-04-23 NOTE — Progress Notes (Signed)
    Subjective:  No CP or dyspnea. No palps. Feels tired. Less coughing last night - slept well.  Objective:  Vital Signs in the last 24 hours: Temp:  [98.2 F (36.8 C)-98.8 F (37.1 C)] 98.8 F (37.1 C) (06/12 0417) Pulse Rate:  [80-112] 89 (06/12 0646) Resp:  [18-20] 20 (06/11 2020) BP: (102-155)/(41-91) 102/86 mmHg (06/12 0646) SpO2:  [93 %-96 %] 94 % (06/12 0417) Weight:  [66.452 kg (146 lb 8 oz)] 66.452 kg (146 lb 8 oz) (06/12 0500)  Intake/Output from previous day: 06/11 0701 - 06/12 0700 In: 840 [P.O.:840] Out: 750 [Urine:750]  Physical Exam: Pt is alert and oriented, NAD. Sitting up in chair eating breakfast. HEENT: normal Neck: JVP - normal Lungs: CTA bilaterally CV: irregularly irregular without murmur or gallop Abd: soft, NT, Positive BS, no hepatomegaly Ext: no C/C/E, distal pulses intact and equal Skin: warm/dry no rash   Lab Results: No results found for this basename: WBC, HGB, PLT,  in the last 72 hours  Recent Labs  04/21/14 0830  NA 128*  K 5.3  CL 93*  CO2 20  GLUCOSE 131*  BUN 12  CREATININE 0.98   No results found for this basename: TROPONINI, CK, MB,  in the last 72 hours  Tele: Atrial fibrillation, heart rate in 80's - personally reviewed  Assessment/Plan:  1. PAF - back in AF since last pm. Heart rate better controlled this morning. Will increase cardizem CD to 180 mg daily. With friable colon mass and iron deficiency anemia, she is not a candidate for anticoagulation.   2. NSTEMI - suspect demand ischemia in setting of AF. No high-grade obstructive disease by cath.   3. Anemia/GI bleed - cecal mass   4. CKD, stage III   5. HTN  Note further plans for staging and outpatient oncology evaluation   Sherren Mocha, M.D. 04/23/2014, 7:34 AM

## 2014-04-23 NOTE — Progress Notes (Signed)
UR COMPLETED  

## 2014-04-24 ENCOUNTER — Inpatient Hospital Stay (HOSPITAL_COMMUNITY): Payer: Medicare Other

## 2014-04-24 LAB — CBC
HCT: 32.4 % — ABNORMAL LOW (ref 36.0–46.0)
HEMOGLOBIN: 10.8 g/dL — AB (ref 12.0–15.0)
MCH: 27.7 pg (ref 26.0–34.0)
MCHC: 33.3 g/dL (ref 30.0–36.0)
MCV: 83.1 fL (ref 78.0–100.0)
PLATELETS: 536 10*3/uL — AB (ref 150–400)
RBC: 3.9 MIL/uL (ref 3.87–5.11)
RDW: 13.3 % (ref 11.5–15.5)
WBC: 13.9 10*3/uL — ABNORMAL HIGH (ref 4.0–10.5)

## 2014-04-24 LAB — BASIC METABOLIC PANEL
BUN: 16 mg/dL (ref 6–23)
BUN: 14 mg/dL (ref 6–23)
CALCIUM: 8.5 mg/dL (ref 8.4–10.5)
CALCIUM: 8.4 mg/dL (ref 8.4–10.5)
CO2: 18 mEq/L — ABNORMAL LOW (ref 19–32)
CO2: 15 mEq/L — ABNORMAL LOW (ref 19–32)
CREATININE: 0.84 mg/dL (ref 0.50–1.10)
Chloride: 85 mEq/L — ABNORMAL LOW (ref 96–112)
Chloride: 87 mEq/L — ABNORMAL LOW (ref 96–112)
Creatinine, Ser: 1.04 mg/dL (ref 0.50–1.10)
GFR calc non Af Amer: 48 mL/min — ABNORMAL LOW (ref >90)
GFR, EST AFRICAN AMERICAN: 56 mL/min — AB (ref >90)
GFR, EST AFRICAN AMERICAN: 72 mL/min — AB (ref >90)
GFR, EST NON AFRICAN AMERICAN: 62 mL/min — AB (ref >90)
GLUCOSE: 163 mg/dL — AB (ref 70–99)
Glucose, Bld: 216 mg/dL — ABNORMAL HIGH (ref 70–99)
POTASSIUM: 4 meq/L (ref 3.7–5.3)
Potassium: 4 mEq/L (ref 3.7–5.3)
SODIUM: 119 meq/L — AB (ref 137–147)
SODIUM: 119 meq/L — AB (ref 137–147)

## 2014-04-24 LAB — GLUCOSE, CAPILLARY: GLUCOSE-CAPILLARY: 106 mg/dL — AB (ref 70–99)

## 2014-04-24 MED ORDER — FLEET ENEMA 7-19 GM/118ML RE ENEM
1.0000 | ENEMA | Freq: Once | RECTAL | Status: AC
  Administered 2014-04-24: 1 via RECTAL
  Filled 2014-04-24: qty 1

## 2014-04-24 MED ORDER — AMIODARONE HCL 200 MG PO TABS
200.0000 mg | ORAL_TABLET | Freq: Every day | ORAL | Status: DC
  Administered 2014-04-24 – 2014-04-26 (×3): 200 mg via ORAL
  Filled 2014-04-24 (×3): qty 1

## 2014-04-24 MED ORDER — BISACODYL 10 MG RE SUPP
10.0000 mg | Freq: Once | RECTAL | Status: AC
  Administered 2014-04-24: 10 mg via RECTAL
  Filled 2014-04-24: qty 1

## 2014-04-24 MED ORDER — MAGNESIUM HYDROXIDE 400 MG/5ML PO SUSP
5.0000 mL | Freq: Once | ORAL | Status: DC
  Filled 2014-04-24: qty 30

## 2014-04-24 NOTE — Progress Notes (Signed)
Subjective:  No complaints of shortness of breath or chest pain. She had some soft blood pressures overnight and required a fluid bolus. Still having difficulty with atrial fibrillation with rapid response at times. Not candidate for anticoagulation due to to bleed and anemia. Complains of feeling anxious and scared.  Objective:  Vital Signs in the last 24 hours: BP 148/71  Pulse 110  Temp(Src) 98.2 F (36.8 C) (Oral)  Resp 18  Ht 5\' 4"  (1.626 m)  Wt 68.448 kg (150 lb 14.4 oz)  BMI 25.89 kg/m2  SpO2 93%  Physical Exam: Elderly white female in no acute distress, sitting up Lungs:  Clear Cardiac: Irregular rhythm, normal S1 and S2, no S3 Abdomen:  Soft, nontender, no masses Extremities:  No edema present  Intake/Output from previous day: 06/12 0701 - 06/13 0700 In: 1194.6 [P.O.:180; I.V.:964.6; IV Piggyback:50] Out: -   Weight Filed Weights   04/22/14 0501 04/23/14 0500 04/24/14 0533  Weight: 67.4 kg (148 lb 9.4 oz) 66.452 kg (146 lb 8 oz) 68.448 kg (150 lb 14.4 oz)    Lab Results: Basic Metabolic Panel:  Recent Labs  04/23/14 1717 04/24/14 0413  NA 117* 119*  K 4.0 4.0  CL 83* 85*  CO2 18* 18*  GLUCOSE 219* 163*  BUN 17 16  CREATININE 1.10 1.04   CBC:  Recent Labs  04/23/14 1325 04/24/14 0413  WBC 16.5* 13.9*  HGB 10.7* 10.8*  HCT 31.8* 32.4*  MCV 82.6 83.1  PLT 467* 536*    Telemetry: Atrial fibrillation rapid at times.  Assessment/Plan:  1. Paroxysmal atrial fibrillation with soft blood pressures at times. Blood pressure is currently stable this morning 2. Severe hyponatremia 3. Colon cancer 4. Calcified coronary arteries without significant obstruction  Recommendations:  Low-dose amiodarone may help maintain sinus rhythm and help with rate control as blood pressure has been soft and sometimes her diltiazem and beta blockers be need to be held. We'll try this while she is in to see if it will help. Not candidate for  anticoagulation.      Kerry Hough  MD [facility]  04/24/2014, 8:26 AM

## 2014-04-24 NOTE — Progress Notes (Signed)
CRITICAL VALUE ALERT  Critical value received:  Na 119  Date of notification:  6/13  Time of notification:  0511  Critical value read back:yes  Nurse who received alert:  Janeli Lewison, Bing Neighbors, RN  MD notified (1st page):  Rogue Bussing  Time of first page:  0512  MD notified (2nd page):  Time of second page:  Responding MD:  Rogue Bussing  Time MD responded:  [phone]

## 2014-04-24 NOTE — Consult Note (Signed)
80 yo female with multiple and complex medical problems including chronic hyponatremia with sodiums in the [place] as low as 121 in Jan 2015 and 118 in Dec 2013. She has a history of dilute urine with spg of 1.004 on a coule of occassions in the past. sSodium was 117-122 on admission increasing to 136 and now back down to 119. She  presented to [facility] ED with main concern of several days duration of progressively worsening fatigue, lethargy, and shortness of breath. In the ED, pt was noted to be in atrial fibrillation with HR in 150's, initial troponin elevated at 1.76. N. The patient was noted to have NSTEMI with elevated troponin. A nuclear medicine stress test 6/5 showed a moderate sized, partially reversible inferolateral perfusion defect, consistent with ischemia, EF 54%. Patient also developed anemia during the hospitalization. EGD was normal. CT of the abdomen and pelvis revealed a cecal mass, and the patient underwent coloscopy on 6/8 which showed a large exophytic bleeding mass encompassing the ileocecal valve and half of the circumference of the cecum.  Biopsy was c/w adenocarcinoma.  Urine osm was 129 and na 22  when Sna was 122 on June 3. TSH was ok in March.  CTA showed sm left pl effusion and 3 nodules up to 76m.  Of note she repeats that she enjoys drinking water and she likes to also eat ice all day long.    Past Medical History  Diagnosis Date  . Hypertension   . Coronary artery disease   . Heart murmur   . Hypothyroidism 05-02-12    tx. Synthroid  . Headache(784.0) 05-02-12    past hx. migrianes-many yrs ago  . Arthritis 05-02-12    osteoarthritis-rt. hip  . Pneumonia 05-02-12    mild case  . 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 Acute upper respiratory infections of unspecified site 2014  . Unspecified venous (peripheral) insufficiency 2013  . Senile osteoporosis 2013  . Unspecified closed fracture of pelvis 10/13/2012  . Cellulitis and abscess of leg, except foot 2013  . Altered mental  status 2013  . Chronic pain syndrome 2013  . Urinary tract infection, site not specified   . Hyposmolality and/or hyponatremia 05/2012  . Congestive heart failure, unspecified 05/2012  . Pneumonia, organism unspecified 05/2012  . Pressure ulcer, buttock(707.05) 05/2012  . Hypoxemia 05/2012  . Reflux esophagitis 3013  . Unspecified constipation 2013  . Acute posthemorrhagic anemia 05/2012  . Coronary atherosclerosis of native coronary artery 05/2012  . Pain in joint, pelvic region and thigh 04/2012  . Dysphagia, unspecified(787.20) 03/2012  . Benign neoplasm of adrenal gland 10/2011  . Alzheimer's disease 06/2011  . Other seborrheic keratosis 2012  . Hypertensive renal disease, benign 2012  . Pain in joint, lower leg 2012  . Memory loss 09/2010  . Spinal stenosis, lumbar region, without neurogenic claudication 2011  . Unspecified vitamin D deficiency 2010  . Unspecified disorder of kidney and ureter 2010  . Edema 2010  . Other malaise and fatigue 2009  . Congenital factor VIII disorder 2009  . Senile dementia with delusional features 2008  . Generalized hyperhidrosis 2007  . Irritable bowel syndrome 2007  . Unspecified chronic bronchitis 2006  . Depression 06/2005     depression only,tx. oral meds  . Shortness of breath 2005  . Anxiety 2004  . Unspecified late effects of cerebrovascular disease 2004  . Migraine with aura, without mention of intractable migraine without mention of status migrainosus 2003  . Tinnitus 2003  .  Other symptoms involving cardiovascular system 2003  . Hyperlipidemia 2002   Past Surgical History  Procedure Laterality Date  . Tonsillectomy    . Tubal ligation  05-02-12  .  tummy tuck  05-02-12    many yrs ago  . Cataract extraction, bilateral  05-02-12    bilateral  . Retinal detachment surgery  05-02-12    left  . Total hip arthroplasty  05/09/2012    Procedure: TOTAL HIP ARTHROPLASTY ANTERIOR APPROACH;  Surgeon: Mcarthur Rossetti, MD;  Location: [facility]  ORS;  Service: Orthopedics;  Laterality: Right;  . Dental surgery  08/11/13    3 teeth surgical removed by Dr. Lewanda Rife  . Esophagogastroduodenoscopy N/A 04/16/2014    Procedure: ESOPHAGOGASTRODUODENOSCOPY (EGD);  Surgeon: Lafayette Dragon, MD;  Location: [facility];  Service: Endoscopy;  Laterality: N/A;  . Colonoscopy N/A 04/19/2014    Procedure: COLONOSCOPY;  Surgeon: [name], MD;  Location: [facility] ENDOSCOPY;  Service: Endoscopy;  Laterality: N/A;   Social History:  reports that she quit smoking about 5 years ago. Her smoking use included Cigarettes. She has a 62 pack-year smoking history. She has never used smokeless tobacco. She reports that she does not drink alcohol or use illicit drugs. Allergies:  Allergies  Allergen Reactions  . Levaquin [Levofloxacin In D5w] Hives  . Penicillins Rash   Family History  Problem Relation Age of Onset  . Diabetes Father     Medications:  Prior to Admission:  Prescriptions prior to admission  Medication Sig Dispense Refill  . acetaminophen (TYLENOL) 325 MG tablet Take 325 mg by mouth daily as needed for mild pain. One daily as needed      . albuterol (PROVENTIL HFA;VENTOLIN HFA) 108 (90 BASE) MCG/ACT inhaler Inhale 2 puffs into the lungs every 6 (six) hours as needed for wheezing or shortness of breath.      . Ascorbic Acid (VITAMIN C) 1000 MG tablet Take 1,000 mg by mouth daily with breakfast.      . aspirin 325 MG tablet Take 1 tablet (325 mg total) by mouth daily.  30 tablet  0  . brimonidine-timolol (COMBIGAN) 0.2-0.5 % ophthalmic solution Place 1 drop into both eyes every 12 (twelve) hours.       . calcitonin, salmon, (MIACALCIN/FORTICAL) 200 UNIT/ACT nasal spray Place 1 spray into alternate nostrils daily. Use one spray in one nostril once daily. Alternate nostrils daily      . calcium-vitamin D (OSCAL WITH D) 500-200 MG-UNIT per tablet Take 1 tablet by mouth 3 (three) times daily. Take one tablet three times daily for calcium supplementation       . cefUROXime (CEFTIN) 500 MG tablet Take 500 mg by mouth 2 (two) times daily with a meal. For 10 days.      . clonazePAM (KLONOPIN) 0.25 MG disintegrating tablet Take 0.25 mg by mouth every 4 (four) hours as needed (breakthrough anxiety).      . clonazePAM (KLONOPIN) 0.5 MG tablet Take 0.5 mg by mouth at bedtime.      . ferrous sulfate 325 (65 FE) MG tablet Take 325 mg by mouth 2 (two) times daily with a meal.      . furosemide (LASIX) 20 MG tablet Take 20 mg by mouth 2 (two) times daily.      Marland Kitchen ibuprofen (ADVIL,MOTRIN) 200 MG tablet Take 400 mg by mouth 3 (three) times daily with meals.      Marland Kitchen ipratropium-albuterol (DUONEB) 0.5-2.5 (3) MG/3ML SOLN Take 3 mLs by nebulization every 6 (  six) hours as needed (cough/wheeze.). AND every 6 hours for 5 days starting 04/09/14.  (Last dose 04/14/14 at 0000.)      . lamoTRIgine (LAMICTAL) 200 MG tablet Take 200 mg by mouth daily.      Marland Kitchen levothyroxine (SYNTHROID, LEVOTHROID) 88 MCG tablet Take 88 mcg by mouth daily before breakfast.      . LORazepam (ATIVAN) 0.5 MG tablet Take 0.5 mg by mouth every 4 (four) hours as needed for anxiety (IN THE EVENT THAT CLONAZEPAM IS UNAVAILABLE.).      Marland Kitchen multivitamin-lutein (OCUVITE-LUTEIN) CAPS Take 1 capsule by mouth daily.      . naproxen sodium (ANAPROX) 220 MG tablet Take 220 mg by mouth daily as needed. Take one daily      . nitroGLYCERIN (NITROSTAT) 0.4 MG SL tablet Place 0.4 mg under the tongue every 5 (five) minutes as needed for chest pain.      Marland Kitchen omeprazole (PRILOSEC) 20 MG capsule Take 20 mg by mouth 2 (two) times daily before a meal.      . omeprazole (PRILOSEC) 40 MG capsule Take 40 mg by mouth 4 (four) times daily.      . pilocarpine (SALAGEN) 5 MG tablet Take 5 mg by mouth 3 (three) times daily.      . potassium chloride SA (K-DUR,KLOR-CON) 20 MEQ tablet Take 20 mEq by mouth 2 (two) times daily.      Marland Kitchen saccharomyces boulardii (FLORASTOR) 250 MG capsule Take 250 mg by mouth 2 (two) times daily. For 10 days.       Marland Kitchen senna (SENOKOT) 8.6 MG TABS Take 2 tablets by mouth at bedtime.      . sertraline (ZOLOFT) 25 MG tablet Take 1 tablet (25 mg total) by mouth daily.  30 tablet  0   Scheduled: . amiodarone  200 mg Oral Daily  . atorvastatin  10 mg Oral q1800  . brimonidine  1 drop Both Eyes Q12H   And  . timolol  1 drop Both Eyes Q12H  . calcitonin (salmon)  1 spray Alternating Nares Daily  . calcium-vitamin D  1 tablet Oral TID  . ceFEPime (MAXIPIME) IV  1 g Intravenous Q12H  . clonazePAM  0.5 mg Oral QHS  . diltiazem  180 mg Oral Daily  . feeding supplement (ENSURE COMPLETE)  237 mL Oral BID BM  . feeding supplement (RESOURCE BREEZE)  1 Container Oral TID BM  . ferrous sulfate  325 mg Oral BID WC  . guaiFENesin  400 mg Oral BID  . lamoTRIgine  200 mg Oral Daily  . levothyroxine  88 mcg Oral QAC breakfast  . magnesium hydroxide  5 mL Oral Once  . metoprolol tartrate  25 mg Oral 3 times per day  . pantoprazole  40 mg Oral Daily  . pilocarpine  5 mg Oral TID  . saccharomyces boulardii  250 mg Oral BID  . senna  2 tablet Oral QHS  . sertraline  25 mg Oral Daily  . sodium phosphate  1 enema Rectal Once  . vancomycin  1,000 mg Intravenous Q24H     ROS: as per HPI  Blood pressure 128/78, pulse 92, temperature 98.2 F (36.8 C), temperature source Oral, resp. rate 18, height 5' 4"  (1.626 m), weight 68.448 kg (150 lb 14.4 oz), SpO2 93.00%.  General appearance: alert and cooperative Frail Head: Normocephalic, without obvious abnormality, atraumatic Eyes: conjunctivae/corneas clear. PERRL, EOM's intact.  Nose: Nares normal. Septum midline. Mucosa normal. No drainage or sinus tenderness. Throat:  lips, mucosa, and tongue normal; teeth and gums normal Resp: clear to auscultation bilaterally Chest wall: no tenderness Cardio: regular rate and rhythm, S1, S2 normal, no murmur, click, rub or gallop GI: mild right sided tenderness Extremities: extremities normal, atraumatic, no cyanosis or edema Skin:  Skin color, texture, turgor normal. No rashes or lesions Neurologic: nonfocal, forgetful Results for orders placed during the hospital encounter of 04/13/14 (from the past 48 hour(s))  CULTURE, EXPECTORATED SPUTUM-ASSESSMENT     Status: None   Collection Time    04/23/14  8:20 AM      Result Value Ref Range   Specimen Description SPUTUM     Special Requests Normal     Sputum evaluation       Value: THIS SPECIMEN IS ACCEPTABLE. RESPIRATORY CULTURE REPORT TO FOLLOW.   Report Status 04/23/2014 FINAL    CULTURE, RESPIRATORY (NON-EXPECTORATED)     Status: None   Collection Time    04/23/14  8:20 AM      Result Value Ref Range   Specimen Description SPUTUM     Special Requests NONE     Gram Stain       Value: FEW WBC PRESENT,BOTH PMN AND MONONUCLEAR     RARE SQUAMOUS EPITHELIAL CELLS PRESENT     RARE GRAM POSITIVE COCCI     IN PAIRS RARE GRAM NEGATIVE RODS     Performed at [facility]   Culture       Value: NORMAL OROPHARYNGEAL FLORA     Performed at [facility]   Report Status PENDING    CBC     Status: Abnormal   Collection Time    04/23/14  1:25 PM      Result Value Ref Range   WBC 16.5 (*) 4.0 - 10.5 K/uL   RBC 3.85 (*) 3.87 - 5.11 MIL/uL   Hemoglobin 10.7 (*) 12.0 - 15.0 g/dL   HCT 31.8 (*) 36.0 - 46.0 %   MCV 82.6  78.0 - 100.0 fL   MCH 27.8  26.0 - 34.0 pg   MCHC 33.6  30.0 - 36.0 g/dL   RDW 13.5  11.5 - 15.5 %   Platelets 467 (*) 150 - 400 K/uL  BASIC METABOLIC PANEL     Status: Abnormal   Collection Time    04/23/14  1:29 PM      Result Value Ref Range   Sodium 116 (*) 137 - 147 mEq/L   Comment: REPEATED TO VERIFY     CRITICAL RESULT CALLED TO, READ BACK BY AND VERIFIED WITH:     BROADNEX,N RN [id number] WILLIAMSON,J   Potassium 4.7  3.7 - 5.3 mEq/L   Chloride 81 (*) 96 - 112 mEq/L   CO2 22  19 - 32 mEq/L   Glucose, Bld 203 (*) 70 - 99 mg/dL   BUN 18  6 - 23 mg/dL   Creatinine, Ser 1.24 (*) 0.50 - 1.10 mg/dL   Calcium 8.8  8.4 - 10.5  mg/dL   GFR calc non Af Amer 39 (*) >90 mL/min   GFR calc Af Amer 45 (*) >90 mL/min   Comment: (NOTE)     The eGFR has been calculated using the CKD EPI equation.     This calculation has not been validated in all clinical situations.     eGFR's persistently <90 mL/min signify possible Chronic Kidney     Disease.  BASIC METABOLIC PANEL     Status: Abnormal   Collection Time  04/23/14  5:17 PM      Result Value Ref Range   Sodium 117 (*) 137 - 147 mEq/L   Comment: REPEATED TO VERIFY     CRITICAL RESULT CALLED TO, READ BACK BY AND VERIFIED WITH:     Cherylann Parr RN 1858 04/23/2014     BY BOVELL T.   Potassium 4.0  3.7 - 5.3 mEq/L   Chloride 83 (*) 96 - 112 mEq/L   CO2 18 (*) 19 - 32 mEq/L   Glucose, Bld 219 (*) 70 - 99 mg/dL   BUN 17  6 - 23 mg/dL   Creatinine, Ser 1.10  0.50 - 1.10 mg/dL   Calcium 8.4  8.4 - 10.5 mg/dL   GFR calc non Af Amer 45 (*) >90 mL/min   GFR calc Af Amer 52 (*) >90 mL/min   Comment: (NOTE)     The eGFR has been calculated using the CKD EPI equation.     This calculation has not been validated in all clinical situations.     eGFR's persistently <90 mL/min signify possible Chronic Kidney     Disease.  GLUCOSE, CAPILLARY     Status: Abnormal   Collection Time    04/24/14 12:15 AM      Result Value Ref Range   Glucose-Capillary 106 (*) 70 - 99 mg/dL  BASIC METABOLIC PANEL     Status: Abnormal   Collection Time    04/24/14  4:13 AM      Result Value Ref Range   Sodium 119 (*) 137 - 147 mEq/L   Comment: CRITICAL RESULT CALLED TO, READ BACK BY AND VERIFIED WITH:     S.ADAMSON RN AT 0510 ON [id number] BY C.BONGEL   Potassium 4.0  3.7 - 5.3 mEq/L   Chloride 85 (*) 96 - 112 mEq/L   CO2 18 (*) 19 - 32 mEq/L   Glucose, Bld 163 (*) 70 - 99 mg/dL   BUN 16  6 - 23 mg/dL   Creatinine, Ser 1.04  0.50 - 1.10 mg/dL   Calcium 8.5  8.4 - 10.5 mg/dL   GFR calc non Af Amer 48 (*) >90 mL/min   GFR calc Af Amer 56 (*) >90 mL/min   Comment: (NOTE)     The eGFR has been  calculated using the CKD EPI equation.     This calculation has not been validated in all clinical situations.     eGFR's persistently <90 mL/min signify possible Chronic Kidney     Disease.  CBC     Status: Abnormal   Collection Time    04/24/14  4:13 AM      Result Value Ref Range   WBC 13.9 (*) 4.0 - 10.5 K/uL   RBC 3.90  3.87 - 5.11 MIL/uL   Hemoglobin 10.8 (*) 12.0 - 15.0 g/dL   HCT 32.4 (*) 36.0 - 46.0 %   MCV 83.1  78.0 - 100.0 fL   MCH 27.7  26.0 - 34.0 pg   MCHC 33.3  30.0 - 36.0 g/dL   RDW 13.3  11.5 - 15.5 %   Platelets 536 (*) 150 - 400 K/uL   Dg Abd 1 View  04/24/2014   CLINICAL DATA:  Constipation  EXAM: ABDOMEN - 1 VIEW  COMPARISON:  Abdominal CT 04/17/2014  FINDINGS: There is no evidence of bowel obstruction. Stool volume is within normal limits. No suspicious intra-abdominal mass effect or calcification. Persistent small pleural effusion or pleural thickening at the peripheral left base.  Diffuse and advanced degenerative disc and facet disease. Right hip arthroplasty; lucencies in the right acetabulum likely represent pre-existing subchondral cysts. Remote obturator ring fractures.  IMPRESSION: Nonobstructive bowel gas pattern. Stool volume is within normal limits.   Electronically Signed   By: Jorje Guild M.D.   On: 04/24/2014 11:26    Assessment:  1 Acute and Chronic Hyponatremia, prob due to chronic xs water ingestion.  This is not SIADH 2 Adenocarcinoma of the colon, lung nodules Plan: 1 Fluid restrict 2 Check cortisol Kelsey Edman C 04/24/2014, 7:31 PM

## 2014-04-24 NOTE — Progress Notes (Signed)
Patient ID: Jenny [name]  female  QAS:[id number]    DOB: 12/06/1926    DOA: 04/13/2014  PCP: Estill Dooms, MD  hospitalization course 80 yo female with multiple and complex medical problems including HTN, HLD, dementia, CHF, CAD, and hypothyroidism, who presented to [facility] ED with main concern of several days duration of progressively worsening fatigue, lethargy, and shortness of breath. In the ED, pt was noted to be in atrial fibrillation with HR in 150's, initial troponin elevated at 1.76. Na 122.   Patient was admitted to the stepdown unit, and placed on a Cardizem drip. Cardiology was consulted.  The patient was noted to have NSTEMI with elevated troponin. A nuclear medicine stress test 6/5 showed a moderate sized, partially reversible inferolateral perfusion defect, consistent with ischemia, EF 54%. Patient also developed anemia during the hospitalization.  Cardiology recommended an anemia workup prior to cardiac cath. EGD was normal. CT of the abdomen and pelvis revealed a cecal mass, and the patient underwent coloscopy on 6/8 which showed a large exophytic bleeding mass encompassing the ileocecal valve. General surgery was consulted.   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 Biopsy positive for adenocarcinoma -Oncology consulted. Plan to have PET Scan fist prior to any surgical intervention.   Hypotension: suspect is multifactorial , medications, (Cardizem increase today), decrease volume. will hold lasix. Respond to IV fluids. BP better.   Hyponatremia; Metabolic acidosis; Might explain weakness. Will increase IV fluids to 100 cc/hr. Mild improvement. Will ask renal to help with management.    Bronchitis,  chronic//PNA - Recently treated for pneumonia, continue Xopenex nebs, Mucinex  - has completed a course of Ceftin  -Will order sputum culture. Schedule guaifenesin. Incentive spirometry.  --On Dysphagia 3 diet.  -Started on IV  Vancomycin and Cefepime 6-12 due to leukocytosis to cover for PNA.   Atrial fibrillation with RVR: Paroxysmal - Heart rate now again controlled, currently in normal sinus rhythm, Cardizem drip being rapidly weaned to off  - Not on any anticoagulation at this time due to anemia, newly diagnosed colon cancer - CHADSVASC is 5 so would like to initiate anticoag as soon as safely feasible  - Echo done outpatient EF 65%. LV ef normal, no wma, no valvular abn   -TSH normal,  d-dimer was elevated, CT angiography chest negative for pulmonary embolism -Had episode of A fib RVR 6-12, Cardizem was increased. Become hypotensive. Started on amiodarone 6-13.  Constipation ? Per nurse patient had  A BM. Will check KUB.   NSTEMI  -No chest pain, Troponins trending down. -Stress Myoview test showed moderate size and intensity, partially reversible inferolateral perfusion defect (SDS 4), consistent with ischemia - Extent 19%. LVEF 54%, normal wall motion -Cardiac cath revealed no signif coronary lesion   Acute on chronic diastolic CHF -CHF likely precipitated due to rapid A. Fib - appears well compensated at this time  -will hold lasix due to hypotension and hyponatremia.   Hypothyroidism - TSH 1.69, continue Synthroid  Anxiety, depression  Bipolar I disorder - Continue Klonopin per her home regimen, continue Zoloft  GERD  -continue PPI  Chronic dysphagia - Stable   DVT Prophylaxis: SCDs  Code Status: FULL   Family Communication:  Care discussed with patient.   Disposition: To be determine. Oncology consulted.   Consultants:  Cardiology   GI  General surgery  Oncology  Antibiotics:  Ceftin 6/2 >> 6/7  Vancomycin 6-12  Cefepime.  6-12  Subjective: Still not feeling well, feels weak, no better. Still coughing a lot.   Objective: Weight change: 1.996 kg (4 lb 6.4 oz)  Intake/Output Summary (Last 24 hours) at 04/24/14 1056 Last data filed at 04/24/14 0908  Gross per 24 hour  Intake 1194.58 ml  Output      0 ml  Net 1194.58 ml   Blood pressure 148/71, pulse 110, temperature 98.2 F (36.8 C), temperature source Oral, resp. rate 18, height 5\' 4"  (1.626 m), weight 68.448 kg (150 lb 14.4 oz), SpO2 93.00%.  Physical Exam: General: no acute respiratory distress CVS: RRR no appreciable M or rub  Chest: bilateral fine crackles.  Abdomen: soft, nontender, nondistended, normal bowel sounds  Extremities: no cyanosis, clubbing or edema noted bilaterally  Lab Results: Basic Metabolic Panel:  Recent Labs Lab 04/21/14 0830  04/23/14 1717 04/24/14 0413  NA 128*  < > 117* 119*  K 5.3  < > 4.0 4.0  CL 93*  < > 83* 85*  CO2 20  < > 18* 18*  GLUCOSE 131*  < > 219* 163*  BUN 12  < > 17 16  CREATININE 0.98  < > 1.10 1.04  CALCIUM 9.1  < > 8.4 8.5  MG 1.7  --   --   --   < > = values in this interval not displayed. Liver Function Tests: No results found for this basename: AST, ALT, ALKPHOS, BILITOT, PROT, ALBUMIN,  in the last 168 hours  CBC:  Recent Labs Lab 04/23/14 1325 04/24/14 0413  WBC 16.5* 13.9*  HGB 10.7* 10.8*  HCT 31.8* 32.4*  MCV 82.6 83.1  PLT 467* 536*    Micro Results: Recent Results (from the past 240 hour(s))  CULTURE, EXPECTORATED SPUTUM-ASSESSMENT     Status: None   Collection Time    04/23/14  8:20 AM      Result Value Ref Range Status   Specimen Description SPUTUM   Final   Special Requests Normal   Final   Sputum evaluation     Final   Value: THIS SPECIMEN IS ACCEPTABLE. RESPIRATORY CULTURE REPORT TO FOLLOW.   Report Status 04/23/2014 FINAL   Final  CULTURE, RESPIRATORY (NON-EXPECTORATED)     Status: None   Collection Time    04/23/14  8:20 AM      Result Value Ref Range  Status   Specimen Description SPUTUM   Final   Special Requests NONE   Final   Gram Stain     Final   Value: FEW WBC PRESENT,BOTH PMN AND MONONUCLEAR     RARE SQUAMOUS EPITHELIAL CELLS PRESENT     RARE GRAM POSITIVE COCCI     IN PAIRS RARE GRAM NEGATIVE RODS     Performed at [facility]   Culture PENDING   Incomplete   Report Status PENDING   Incomplete   Medications: Scheduled Meds: . amiodarone  200 mg Oral Daily  . atorvastatin  10 mg Oral q1800  . brimonidine  1 drop Both Eyes Q12H   And  . timolol  1 drop Both Eyes Q12H  . calcitonin (salmon)  1 spray Alternating Nares Daily  . calcium-vitamin D  1 tablet Oral TID  . ceFEPime (MAXIPIME) IV  1 g Intravenous Q12H  .  clonazePAM  0.5 mg Oral QHS  . diltiazem  180 mg Oral Daily  . feeding supplement (ENSURE COMPLETE)  237 mL Oral BID BM  . feeding supplement (RESOURCE BREEZE)  1 Container Oral TID BM  . ferrous sulfate  325 mg Oral BID WC  . guaiFENesin  400 mg Oral BID  . lamoTRIgine  200 mg Oral Daily  . levothyroxine  88 mcg Oral QAC breakfast  . metoprolol tartrate  25 mg Oral 3 times per day  . pantoprazole  40 mg Oral Daily  . pilocarpine  5 mg Oral TID  . saccharomyces boulardii  250 mg Oral BID  . senna  2 tablet Oral QHS  . sertraline  25 mg Oral Daily  . vancomycin  1,000 mg Intravenous Q24H   Time spent:  30 mins   LOS: 11 days   [place]  [facility] For Consults/Admissions Hydrologist - [phone] Office  [phone] Pager [phone]  On-Call/Text Page:      [email or web address]      password [facility]  04/24/2014, 10:56 AM

## 2014-04-24 NOTE — Progress Notes (Signed)
Per RN, Pt not ready for d/c today.  CSW to continue to follow.   Bernita Raisin, [place] [facility] [phone]

## 2014-04-24 NOTE — Progress Notes (Signed)
CRITICAL VALUE ALERT  Critical value received: Na 119  Date of notification:  04/24/14  Time of notification:  2105  Critical value read back:yes  Nurse who received alert:  Philemon Kingdom    MD notified (1st page):    Time of first page:    MD notified (2nd page):  Time of second page:  Responding MD:    Time MD responded:    This lab remains the same. MD aware of NA level. Will continue to monitor patient for any changes.Marland Kitchen

## 2014-04-25 DIAGNOSIS — I5032 Chronic diastolic (congestive) heart failure: Secondary | ICD-10-CM

## 2014-04-25 LAB — CBC
HCT: 30.4 % — ABNORMAL LOW (ref 36.0–46.0)
Hemoglobin: 10.3 g/dL — ABNORMAL LOW (ref 12.0–15.0)
MCH: 28.1 pg (ref 26.0–34.0)
MCHC: 33.9 g/dL (ref 30.0–36.0)
MCV: 82.8 fL (ref 78.0–100.0)
PLATELETS: 487 10*3/uL — AB (ref 150–400)
RBC: 3.67 MIL/uL — AB (ref 3.87–5.11)
RDW: 13.3 % (ref 11.5–15.5)
WBC: 9.1 10*3/uL (ref 4.0–10.5)

## 2014-04-25 LAB — BASIC METABOLIC PANEL
BUN: 13 mg/dL (ref 6–23)
CHLORIDE: 87 meq/L — AB (ref 96–112)
CO2: 16 meq/L — AB (ref 19–32)
Calcium: 8.5 mg/dL (ref 8.4–10.5)
Creatinine, Ser: 0.89 mg/dL (ref 0.50–1.10)
GFR calc Af Amer: 67 mL/min — ABNORMAL LOW (ref >90)
GFR calc non Af Amer: 58 mL/min — ABNORMAL LOW (ref >90)
Glucose, Bld: 137 mg/dL — ABNORMAL HIGH (ref 70–99)
POTASSIUM: 3.9 meq/L (ref 3.7–5.3)
SODIUM: 121 meq/L — AB (ref 137–147)

## 2014-04-25 LAB — CULTURE, RESPIRATORY: Culture: NORMAL

## 2014-04-25 LAB — LACTIC ACID, PLASMA: LACTIC ACID, VENOUS: 1 mmol/L (ref 0.5–2.2)

## 2014-04-25 LAB — CULTURE, RESPIRATORY W GRAM STAIN

## 2014-04-25 LAB — CORTISOL: CORTISOL PLASMA: 39.2 ug/dL

## 2014-04-25 MED ORDER — VANCOMYCIN HCL IN DEXTROSE 750-5 MG/150ML-% IV SOLN
750.0000 mg | Freq: Two times a day (BID) | INTRAVENOUS | Status: DC
  Administered 2014-04-25: 750 mg via INTRAVENOUS
  Filled 2014-04-25: qty 150

## 2014-04-25 MED ORDER — DOXYCYCLINE HYCLATE 100 MG PO TABS
100.0000 mg | ORAL_TABLET | ORAL | Status: DC
  Administered 2014-04-25 – 2014-04-26 (×3): 100 mg via ORAL
  Filled 2014-04-25 (×4): qty 1

## 2014-04-25 MED ORDER — SODIUM BICARBONATE 650 MG PO TABS
650.0000 mg | ORAL_TABLET | Freq: Two times a day (BID) | ORAL | Status: DC
  Administered 2014-04-25 – 2014-04-26 (×3): 650 mg via ORAL
  Filled 2014-04-25 (×4): qty 1

## 2014-04-25 NOTE — Progress Notes (Signed)
ANTIBIOTIC CONSULT NOTE - Follow Up  Pharmacy Consult for Vancomycin, Cefepime Indication: pneumonia  Allergies  Allergen Reactions  . Levaquin [Levofloxacin In D5w] Hives  . Penicillins Rash    Patient Measurements: Height: 5\' 4"  (162.6 cm) Weight: 154 lb 8 oz (70.081 kg) IBW/kg (Calculated) : 54.7  Vital Signs: Temp: 97.8 F (36.6 C) (06/14 0447) Temp src: Oral (06/14 0447) BP: 139/85 mmHg (06/14 0447) Pulse Rate: 83 (06/14 0447) Intake/Output from previous day: 06/13 0701 - 06/14 0700 In: 1843 [P.O.:600; I.V.:943; IV Piggyback:300] Out: -   Labs:  Recent Labs  04/23/14 1325  04/24/14 0413 04/24/14 1951 04/25/14 0443  WBC 16.5*  --  13.9*  --  9.1  HGB 10.7*  --  10.8*  --  10.3*  PLT 467*  --  536*  --  487*  CREATININE  --   < > 1.04 0.84 0.89  < > = values in this interval not displayed. Estimated Creatinine Clearance: 45.2 ml/min (by C-G formula based on Cr of 0.89).   Microbiology: Recent Results (from the past 720 hour(s))  MRSA PCR SCREENING     Status: None   Collection Time    04/13/14  7:57 PM      Result Value Ref Range Status   MRSA by PCR NEGATIVE  NEGATIVE Final   Comment:            The GeneXpert MRSA Assay (FDA     approved for NASAL specimens     only), is one component of a     comprehensive MRSA colonization     surveillance program. It is not     intended to diagnose MRSA     infection nor to guide or     monitor treatment for     MRSA infections.  URINE CULTURE     Status: None   Collection Time    04/14/14  6:33 AM      Result Value Ref Range Status   Specimen Description URINE, CATHETERIZED   Final   Special Requests NONE   Final   Culture  Setup Time     Final   Value: 04/14/2014 11:34     Performed at [place]     Final   Value: NO GROWTH     Performed at [facility]   Culture     Final   Value: NO GROWTH     Performed at [facility]   Report Status 04/15/2014 FINAL    Final  CULTURE, EXPECTORATED SPUTUM-ASSESSMENT     Status: None   Collection Time    04/23/14  8:20 AM      Result Value Ref Range Status   Specimen Description SPUTUM   Final   Special Requests Normal   Final   Sputum evaluation     Final   Value: THIS SPECIMEN IS ACCEPTABLE. RESPIRATORY CULTURE REPORT TO FOLLOW.   Report Status 04/23/2014 FINAL   Final  CULTURE, RESPIRATORY (NON-EXPECTORATED)     Status: None   Collection Time    04/23/14  8:20 AM      Result Value Ref Range Status   Specimen Description SPUTUM   Final   Special Requests NONE   Final   Gram Stain     Final   Value: FEW WBC PRESENT,BOTH PMN AND MONONUCLEAR     RARE SQUAMOUS EPITHELIAL CELLS PRESENT     RARE GRAM POSITIVE COCCI     IN  PAIRS RARE GRAM NEGATIVE RODS     Performed at [facility]   Culture     Final   Value: NORMAL OROPHARYNGEAL FLORA     Performed at [facility]   Report Status PENDING   Incomplete    Assessment: 64 yoF admitted 6/2 with SOB, found to have NSTEMI, afib, anemia and newly diagnosed colon cancer.  She has extensive PMH including CHF, HTN, HLD, CAD, dementia, and started on PO cefuroxime for pneumonia.  This was continued from home on admission until 6/8.  Pharmacy is now consulted to dose Vancomycin and Cefepime for HCAP.  Noted Levaquin (hives) and PCN (rash) allergies, but she tolerated cephalosporin recently and should also tolerate cefepime.  Anti-infectives: 5/27 >> Cefuroxime >> 6/8 6/12 >> Vanc >> 6/12 >> Cefepime >>    Labs / Vitals: Tmax: AF WBCs: improved to wnl Renal: SCr improved to baseline, CrCl 45 ml/min  Microbiology: 6/3 urine: NGF  6/12 sputum: Gram stain: rare GPC, rare GNR - normal flora  Goal of Therapy:  Vancomycin trough level 15-20 mcg/ml Cefepime dose per renal function  Plan:   Adjust Vancomycin to 750mg  IV q12h for improved CrCl  Continue Cefepime 1g IV q12h  Measure Vanc trough at steady state.  Follow up renal fxn and  culture results.  Peggyann Juba, PharmD, BCPS Pager: [phone]  04/25/2014 9:41 AM

## 2014-04-25 NOTE — Progress Notes (Signed)
S: no new CO  Says sge was not aware of a fluid restriction O:BP 140/82  Pulse 83  Temp(Src) 97.8 F (36.6 C) (Oral)  Resp 20  Ht 5\' 4"  (1.626 m)  Wt 70.081 kg (154 lb 8 oz)  BMI 26.51 kg/m2  SpO2 92%  Intake/Output Summary (Last 24 hours) at 04/25/14 1037 Last data filed at 04/25/14 0700  Gross per 24 hour  Intake   1723 ml  Output      0 ml  Net   1723 ml   Weight change: 1.633 kg (3 lb 9.6 oz) NWG:NFAOZ and alert HYQ:MVHQI, irreg Resp:clear Abd:+ BS NTND Ext:no edema NEURO:CNI Ox3   . amiodarone  200 mg Oral Daily  . atorvastatin  10 mg Oral q1800  . brimonidine  1 drop Both Eyes Q12H   And  . timolol  1 drop Both Eyes Q12H  . calcitonin (salmon)  1 spray Alternating Nares Daily  . calcium-vitamin D  1 tablet Oral TID  . ceFEPime (MAXIPIME) IV  1 g Intravenous Q12H  . clonazePAM  0.5 mg Oral QHS  . diltiazem  180 mg Oral Daily  . feeding supplement (ENSURE COMPLETE)  237 mL Oral BID BM  . feeding supplement (RESOURCE BREEZE)  1 Container Oral TID BM  . ferrous sulfate  325 mg Oral BID WC  . guaiFENesin  400 mg Oral BID  . lamoTRIgine  200 mg Oral Daily  . levothyroxine  88 mcg Oral QAC breakfast  . magnesium hydroxide  5 mL Oral Once  . metoprolol tartrate  25 mg Oral 3 times per day  . pantoprazole  40 mg Oral Daily  . pilocarpine  5 mg Oral TID  . saccharomyces boulardii  250 mg Oral BID  . senna  2 tablet Oral QHS  . sertraline  25 mg Oral Daily  . sodium bicarbonate  650 mg Oral BID  . vancomycin  750 mg Intravenous Q12H   Dg Abd 1 View  04/24/2014   CLINICAL DATA:  Constipation  EXAM: ABDOMEN - 1 VIEW  COMPARISON:  Abdominal CT 04/17/2014  FINDINGS: There is no evidence of bowel obstruction. Stool volume is within normal limits. No suspicious intra-abdominal mass effect or calcification. Persistent small pleural effusion or pleural thickening at the peripheral left base.  Diffuse and advanced degenerative disc and facet disease. Right hip arthroplasty;  lucencies in the right acetabulum likely represent pre-existing subchondral cysts. Remote obturator ring fractures.  IMPRESSION: Nonobstructive bowel gas pattern. Stool volume is within normal limits.   Electronically Signed   By: Jorje Guild M.D.   On: 04/24/2014 11:26   BMET    Component Value Date/Time   NA 121* 04/25/2014 0443   NA 117* 04/13/2014   K 3.9 04/25/2014 0443   CL 87* 04/25/2014 0443   CO2 16* 04/25/2014 0443   GLUCOSE 137* 04/25/2014 0443   BUN 13 04/25/2014 0443   BUN 12 04/13/2014   CREATININE 0.89 04/25/2014 0443   CREATININE 1.0 04/13/2014   CALCIUM 8.5 04/25/2014 0443   GFRNONAA 58* 04/25/2014 0443   GFRAA 67* 04/25/2014 0443   CBC    Component Value Date/Time   WBC 9.1 04/25/2014 0443   WBC 11.6 04/07/2014   WBC 5.5 09/30/2012 1555   RBC 3.67* 04/25/2014 0443   RBC 2.92* 04/15/2014 0817   RBC 5.22 09/30/2012 1555   HGB 10.3* 04/25/2014 0443   HGB 15.2 09/30/2012 1555   HCT 30.4* 04/25/2014 0443   HCT 49.2*  09/30/2012 1555   PLT 487* 04/25/2014 0443   MCV 82.8 04/25/2014 0443   MCV 94.3 09/30/2012 1555   MCH 28.1 04/25/2014 0443   MCH 29.1 09/30/2012 1555   MCHC 33.9 04/25/2014 0443   MCHC 30.9* 09/30/2012 1555   RDW 13.3 04/25/2014 0443   LYMPHSABS 1.4 04/13/2014 1519   MONOABS 1.2* 04/13/2014 1519   EOSABS 0.4 04/13/2014 1519   BASOSABS 0.1 04/13/2014 1519     Assessment: 1. Hyponatremia prob due to reset osmostat and excessive free water intake 2. A fib  Plan: 1. Cont fluid restriction 2. Actually record I/O's 3. SNa in AM   Jenny [name] T

## 2014-04-25 NOTE — Progress Notes (Signed)
Subjective:  Sitting on side of bed with no acute distress, no hypotension overnight, continues in atrial fibrillation. Severe hyponatremia noted  Objective:  Vital Signs in the last 24 hours: BP 139/85  Pulse 83  Temp(Src) 97.8 F (36.6 C) (Oral)  Resp 20  Ht 5\' 4"  (1.626 m)  Wt 70.081 kg (154 lb 8 oz)  BMI 26.51 kg/m2  SpO2 92%  Physical Exam: Elderly white female in no acute distress, sitting up Lungs:  Clear Cardiac: Irregular rhythm, normal S1 and S2, no S3 Abdomen:  Soft, nontender, no masses Extremities:  No edema present  Intake/Output from previous day: 06/13 0701 - 06/14 0700 In: 1350 [P.O.:600; I.V.:500; IV Piggyback:250] Out: -   Weight Filed Weights   04/23/14 0500 04/24/14 0533 04/25/14 0649  Weight: 66.452 kg (146 lb 8 oz) 68.448 kg (150 lb 14.4 oz) 70.081 kg (154 lb 8 oz)    Lab Results: Basic Metabolic Panel:  Recent Labs  04/24/14 1951 04/25/14 0443  NA 119* 121*  K 4.0 3.9  CL 87* 87*  CO2 15* 16*  GLUCOSE 216* 137*  BUN 14 13  CREATININE 0.84 0.89   CBC:  Recent Labs  04/24/14 0413 04/25/14 0443  WBC 13.9* 9.1  HGB 10.8* 10.3*  HCT 32.4* 30.4*  MCV 83.1 82.8  PLT 536* 487*    Telemetry: Atrial fibrillation rapid at times.  Assessment/Plan:  1. Paroxysmal atrial fibrillation which has been sustained 2. Severe hyponatremia 3. Colon cancer 4. Calcified coronary arteries without significant obstruction  Recommendations:  Restarted her on amiodarone yesterday to see if perhaps it would help with rate control and to prevent paroxysms. Not good candidate for anticoagulation.     Kerry Hough  MD [facility]  04/25/2014, 8:33 AM

## 2014-04-25 NOTE — Progress Notes (Signed)
Patient ID: Jenny [name]  female  ZOX:[id number]    DOB: [birth year]    DOA: 04/13/2014  PCP: Estill Dooms, MD  hospitalization course 80 yo female with multiple and complex medical problems including HTN, HLD, dementia, CHF, CAD, and hypothyroidism, who presented to [facility] ED with main concern of several days duration of progressively worsening fatigue, lethargy, and shortness of breath. In the ED, pt was noted to be in atrial fibrillation with HR in 150's, initial troponin elevated at 1.76. Na 122.   Patient was admitted to the stepdown unit, and placed on a Cardizem drip. Cardiology was consulted.  The patient was noted to have NSTEMI with elevated troponin. A nuclear medicine stress test 6/5 showed a moderate sized, partially reversible inferolateral perfusion defect, consistent with ischemia, EF 54%. Patient also developed anemia during the hospitalization.  Cardiology recommended an anemia workup prior to cardiac cath. EGD was normal. CT of the abdomen and pelvis revealed a cecal mass, and the patient underwent coloscopy on 6/8 which showed a large exophytic bleeding mass encompassing the ileocecal valve. General surgery was consulted.   Assessment/Plan:  Hypotension: suspect is multifactorial , medications, (Cardizem increased ), decrease volume. will hold lasix. Respond to IV fluids. BP better.   Hyponatremia; Metabolic acidosis; Might explain weakness. Treated initially with IV fluids, no significantly improved. Nephrologist consulted who recommend water restrictions. Sodium increase to 121.  Will add sodium bicarb.   Bronchitis, chronic//PNA - Recently treated for pneumonia, continue Xopenex nebs, Mucinex  - has completed a course of Ceftin  - sputum culture normal oropharyngeal.  Schedule guaifenesin. Incentive spirometry.  --On Dysphagia 3 diet.  -Started on IV  Vancomycin and Cefepime 6-12 due to leukocytosis to cover for PNA. Day antibiotics.  -Will change to Doxy. WBC  normalized.   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 Biopsy positive for adenocarcinoma -Oncology consulted. Plan to have PET Scan fist prior to any surgical intervention.   Atrial fibrillation with RVR: Paroxysmal - Heart rate now again controlled, currently in normal sinus rhythm, Cardizem drip being rapidly weaned to off  - Not on any anticoagulation at this time due to anemia, newly diagnosed colon cancer - CHADSVASC is 5 so would like to initiate anticoag as soon as safely feasible  - Echo done outpatient EF 65%. LV ef normal, no wma, no valvular abn   -TSH normal,  d-dimer was elevated, CT angiography chest negative for pulmonary embolism -Had episode of A fib RVR 6-12, Cardizem was increased. Become hypotensive. Started on amiodarone 6-13.  Constipation ? Per nurse patient had  A BM. KUB no evidence of obstruction.   NSTEMI  -No chest pain, Troponins trending down. -Stress Myoview test showed moderate size and intensity, partially reversible inferolateral perfusion defect (SDS 4), consistent with ischemia - Extent 19%. LVEF 54%, normal wall motion -Cardiac cath revealed no signif coronary lesion   Acute on chronic diastolic CHF -CHF likely precipitated due to rapid A. Fib - appears well compensated at this time  -will hold lasix due to hypotension and hyponatremia.   Hypothyroidism - TSH 1.69, continue Synthroid  Anxiety, depression  Bipolar I disorder - Continue Klonopin per her home regimen, continue Zoloft  GERD  -continue PPI  Chronic dysphagia - Stable   DVT Prophylaxis: SCDs  Code Status: FULL   Family Communication:  Care discussed with patient.   Disposition: To be determine. Oncology consulted.    Consultants:  Cardiology   GI  General surgery  Oncology  Antibiotics:  Ceftin 6/2 >> 6/7  Vancomycin 6-12  Cefepime. 6-12  Subjective: The cough is better, she is breathing better. Feeling less weak.   Objective: Weight change: 1.633 kg (3 lb 9.6 oz)  Intake/Output Summary (Last 24 hours) at 04/25/14 1221 Last data filed at 04/25/14 1219  Gross per 24 hour  Intake   1963 ml  Output      0 ml  Net   1963 ml   Blood pressure 140/82, pulse 83, temperature 97.8 F (36.6 C), temperature source Oral, resp. rate 20, height 5\' 4"  (1.626 m), weight 70.081 kg (154 lb 8 oz), SpO2 92.00%.  Physical Exam: General: no acute respiratory distress CVS: RRR no appreciable M or rub  Chest: bilateral fine crackles.  Abdomen: soft, nontender, nondistended, normal bowel sounds  Extremities: no cyanosis, clubbing or edema noted bilaterally  Lab Results: Basic Metabolic Panel:  Recent Labs Lab 04/21/14 0830  04/24/14 1951 04/25/14 0443  NA 128*  < > 119* 121*  K 5.3  < > 4.0 3.9  CL 93*  < > 87* 87*  CO2 20  < > 15* 16*  GLUCOSE 131*  < > 216* 137*  BUN 12  < > 14 13  CREATININE 0.98  < > 0.84 0.89  CALCIUM 9.1  < > 8.4 8.5  MG 1.7  --   --   --   < > = values in this interval not displayed. Liver Function Tests: No results found for this basename: AST, ALT, ALKPHOS, BILITOT, PROT, ALBUMIN,  in the last 168 hours  CBC:  Recent Labs Lab 04/24/14 0413 04/25/14 0443  WBC 13.9* 9.1  HGB 10.8* 10.3*  HCT 32.4* 30.4*  MCV 83.1 82.8  PLT 536* 487*    Micro Results: Recent Results (from the past 240 hour(s))  CULTURE, EXPECTORATED SPUTUM-ASSESSMENT     Status: None   Collection Time    04/23/14  8:20 AM      Result Value Ref Range Status   Specimen Description SPUTUM   Final   Special Requests Normal   Final   Sputum evaluation     Final   Value: THIS SPECIMEN IS ACCEPTABLE. RESPIRATORY CULTURE REPORT TO FOLLOW.   Report Status 04/23/2014 FINAL   Final   CULTURE, RESPIRATORY (NON-EXPECTORATED)     Status: None   Collection Time    04/23/14  8:20 AM      Result Value Ref Range Status   Specimen Description SPUTUM   Final   Special Requests NONE   Final   Gram Stain     Final   Value: FEW WBC PRESENT,BOTH PMN AND MONONUCLEAR     RARE SQUAMOUS EPITHELIAL CELLS PRESENT     RARE GRAM POSITIVE COCCI     IN PAIRS RARE GRAM NEGATIVE RODS     Performed at [facility]   Culture     Final   Value: NORMAL OROPHARYNGEAL FLORA     Performed at [facility]   Report Status 04/25/2014 FINAL   Final   Medications: Scheduled Meds: . amiodarone  200 mg Oral Daily  . atorvastatin  10 mg Oral q1800  . brimonidine  1 drop Both Eyes Q12H   And  .  timolol  1 drop Both Eyes Q12H  . calcitonin (salmon)  1 spray Alternating Nares Daily  . calcium-vitamin D  1 tablet Oral TID  . clonazePAM  0.5 mg Oral QHS  . diltiazem  180 mg Oral Daily  . doxycycline  100 mg Oral 2 times per day  . feeding supplement (ENSURE COMPLETE)  237 mL Oral BID BM  . feeding supplement (RESOURCE BREEZE)  1 Container Oral TID BM  . ferrous sulfate  325 mg Oral BID WC  . guaiFENesin  400 mg Oral BID  . lamoTRIgine  200 mg Oral Daily  . levothyroxine  88 mcg Oral QAC breakfast  . magnesium hydroxide  5 mL Oral Once  . metoprolol tartrate  25 mg Oral 3 times per day  . pantoprazole  40 mg Oral Daily  . pilocarpine  5 mg Oral TID  . saccharomyces boulardii  250 mg Oral BID  . senna  2 tablet Oral QHS  . sertraline  25 mg Oral Daily  . sodium bicarbonate  650 mg Oral BID   Time spent:  30 mins   LOS: 12 days   Pyatt, Md.  [facility] For Consults/Admissions Hydrologist - [phone] Office  [phone] Pager [phone]  On-Call/Text Page:      [email or web address]      password [facility]  04/25/2014, 12:21 PM

## 2014-04-26 LAB — BASIC METABOLIC PANEL
BUN: 11 mg/dL (ref 6–23)
CHLORIDE: 93 meq/L — AB (ref 96–112)
CO2: 20 meq/L (ref 19–32)
Calcium: 8.8 mg/dL (ref 8.4–10.5)
Creatinine, Ser: 0.88 mg/dL (ref 0.50–1.10)
GFR calc Af Amer: 68 mL/min — ABNORMAL LOW (ref >90)
GFR calc non Af Amer: 59 mL/min — ABNORMAL LOW (ref >90)
GLUCOSE: 114 mg/dL — AB (ref 70–99)
Potassium: 3.8 mEq/L (ref 3.7–5.3)
SODIUM: 126 meq/L — AB (ref 137–147)

## 2014-04-26 MED ORDER — CLONAZEPAM 0.25 MG PO TBDP: 0.2500 mg | ORAL_TABLET | ORAL | Status: DC | PRN

## 2014-04-26 MED ORDER — CLONAZEPAM 0.5 MG PO TABS: 0.5000 mg | ORAL_TABLET | Freq: Every day | ORAL | Status: DC

## 2014-04-26 MED ORDER — BOOST / RESOURCE BREEZE PO LIQD: 1.0000 | Freq: Three times a day (TID) | ORAL | Status: DC

## 2014-04-26 MED ORDER — BENZONATATE 100 MG PO CAPS: 100.0000 mg | ORAL_CAPSULE | Freq: Three times a day (TID) | ORAL | Status: DC | PRN

## 2014-04-26 MED ORDER — METOPROLOL SUCCINATE ER 50 MG PO TB24: 50.0000 mg | ORAL_TABLET | Freq: Every day | ORAL | Status: DC

## 2014-04-26 MED ORDER — METOPROLOL SUCCINATE ER 50 MG PO TB24
50.0000 mg | ORAL_TABLET | Freq: Every day | ORAL | Status: DC
  Administered 2014-04-26: 50 mg via ORAL
  Filled 2014-04-26: qty 1

## 2014-04-26 MED ORDER — LEVALBUTEROL HCL 0.63 MG/3ML IN NEBU: 0.6300 mg | INHALATION_SOLUTION | Freq: Four times a day (QID) | RESPIRATORY_TRACT | Status: DC | PRN

## 2014-04-26 MED ORDER — AMIODARONE HCL 200 MG PO TABS
200.0000 mg | ORAL_TABLET | Freq: Every day | ORAL | Status: DC
Start: 2014-04-26 — End: 2014-05-10

## 2014-04-26 MED ORDER — ATORVASTATIN CALCIUM 10 MG PO TABS: 10.0000 mg | ORAL_TABLET | Freq: Every day | ORAL | Status: DC

## 2014-04-26 MED ORDER — DILTIAZEM HCL ER COATED BEADS 180 MG PO CP24: 180.0000 mg | ORAL_CAPSULE | Freq: Every day | ORAL | Status: DC

## 2014-04-26 MED ORDER — GUAIFENESIN 200 MG PO TABS: 400.0000 mg | ORAL_TABLET | Freq: Two times a day (BID) | ORAL | Status: DC

## 2014-04-26 MED ORDER — DOXYCYCLINE HYCLATE 100 MG PO TABS: 100.0000 mg | ORAL_TABLET | Freq: Two times a day (BID) | ORAL | Status: DC

## 2014-04-26 NOTE — Progress Notes (Signed)
    Subjective:  No CP or dyspnea. No palps.   Objective:  Vital Signs in the last 24 hours: Temp:  [97.8 F (36.6 C)-98.1 F (36.7 C)] 98.1 F (36.7 C) (06/15 0434) Pulse Rate:  [80-114] 114 (06/15 0434) Resp:  [20-28] 28 (06/15 0434) BP: (98-145)/(52-86) 123/81 mmHg (06/15 0434) SpO2:  [93 %-95 %] 93 % (06/15 0434) Weight:  [153 lb 9.6 oz (69.673 kg)] 153 lb 9.6 oz (69.673 kg) (06/15 0434)  Intake/Output from previous day: 06/14 0701 - 06/15 0700 In: 955 [P.O.:795; I.V.:160] Out: 2100 [Urine:2100]  Physical Exam: Pt is alert and oriented, NAD.  HEENT: normal Neck: supple Lungs: decreased BS bases CV: irregular Abd: soft, NT, ND Ext: no edema Skin: warm/dry no rash   Lab Results:  Recent Labs  04/24/14 0413 04/25/14 0443  WBC 13.9* 9.1  HGB 10.8* 10.3*  PLT 536* 487*    Recent Labs  04/25/14 0443 04/26/14 0405  NA 121* 126*  K 3.9 3.8  CL 87* 93*  CO2 16* 20  GLUCOSE 137* 114*  BUN 13 11  CREATININE 0.89 0.88    Tele: Atrial fibrillation rate controlled  Assessment/Plan:  1. PAF - HR controlled; continue cardizem and low dose amiodarone; change lopressor to toprol. With friable colon mass and iron deficiency anemia, she is not a candidate for anticoagulation. FU with Dr Sallyanne Kuster following DC  2. NSTEMI - suspect demand ischemia in setting of AF. No high-grade obstructive disease by cath.   3. Anemia/GI bleed - cecal mass   4. CKD, stage III   5. HTN  Note further plans for staging and outpatient oncology evaluation   We will sign off; please call with questions.  Kirk Ruths, M.D. 04/26/2014, 7:18 AM

## 2014-04-26 NOTE — Discharge Summary (Signed)
Physician Discharge Summary  Jenny [name] TDH:[id number] DOB: 1927-06-14 DOA: 04/13/2014  PCP: Estill Dooms, MD  Admit date: 04/13/2014 Discharge date: 04/26/2014  Time spent: 35 minutes  Recommendations for Outpatient Follow-up:  1. Please repeat sodium level in 24 to 48 hours.  2. Need Water restriction 800 CC.  3. Consider resume lasix depending on volume status.  4. Needs to follow up with DR Junious Silk, needs PET Scan.   Discharge Diagnoses:    Colon cancer   Hyponatremia.    Atrial fibrillation with RVR   Bronchitis, chronic, PNA.    Hypothyroidism   CAD (coronary artery disease)   Bipolar I disorder, most recent episode mixed   GERD (gastroesophageal reflux disease)   Other dysphagia   Congestive heart failure, unspecified   NSTEMI (non-ST elevated myocardial infarction)   Nonspecific abnormal finding in stool contents   Abnormal nuclear cardiac imaging test   Nonspecific (abnormal) findings on radiological and other examination of gastrointestinal tract   Benign neoplasm of colon   Diverticulosis of colon (without mention of hemorrhage)   Colonic cancer   Discharge Condition: Stable.   Diet recommendation: Dysphagia 3 diet, 800 cc water restrictions.   Filed Weights   04/24/14 0533 04/25/14 0649 04/26/14 0434  Weight: 68.448 kg (150 lb 14.4 oz) 70.081 kg (154 lb 8 oz) 69.673 kg (153 lb 9.6 oz)    History of present illness:  Pt is 80 yo female with multiple and complex medical problems including HTN, HLD, dementia, CHF, CAD, hypothyroidism, who presented to [facility] ED with main concern of several days duration of progressively worsening fatigue, lethargy, shortness of breath. Please note that pt is unable to provide details and daughter at bedside was primary historian. Most of the available information obtained from available records as well. Pt apparently treated for PNA recently and is still on ABX but per daughter, pt has been moor sleepy and not eating much. No  reported chest pain or abdomina, urinary concerns. Pt apparently has chronic dyspnea but slightly worse over the past several days.  In ED, pt noted to be in atrial fibrillation with HR in 150's, initial troponin elevated at 1.76. Na 122. Please note that pt has been denying chest pain since arrival to the ED. TRH asked to admit for further evaluation.    Hospital Course:  80 yo female with multiple and complex medical problems including HTN, HLD, dementia, CHF, CAD, and hypothyroidism, who presented to [facility] ED with main concern of several days duration of progressively worsening fatigue, lethargy, and shortness of breath. In the ED, pt was noted to be in atrial fibrillation with HR in 150's, initial troponin elevated at 1.76. Na 122.  Patient was admitted to the stepdown unit, and placed on a Cardizem drip. Cardiology was consulted. The patient was noted to have NSTEMI with elevated troponin. A nuclear medicine stress test 6/5 showed a moderate sized, partially reversible inferolateral perfusion defect, consistent with ischemia, EF 54%.  Patient also developed anemia during the hospitalization. Cardiology recommended an anemia workup prior to cardiac cath. EGD was normal. CT of the abdomen and pelvis revealed a cecal mass, and the patient underwent coloscopy on 6/8 which showed a large exophytic bleeding mass encompassing the ileocecal valve. General surgery was consulted. Patient was diagnosed with colon cancer. She also developed worsening of her hyponatremia. Her sodium level has improved with water restriction. Her lasix was on hold due to hypotension. This medication will need to be resume depending on volume status.  Plan is to proceed with PET scan outpatient and further treatment depending on PET scan results.   Hypotension: suspect is multifactorial , medications, (Cardizem increased ), decrease volume. will hold lasix. Respond to IV fluids. BP better.  Hyponatremia; Metabolic acidosis; Might  explain weakness. Treated initially with IV fluids, no significantly improved. Nephrologist consulted who recommend water restrictions. Sodium increase to 126 from 117. Continue with water restriction. She received 2 doses of sodium bicarb. Needs repeat B-met.   Bronchitis, chronic//PNA  - Recently treated for pneumonia, continue Xopenex nebs, Mucinex  - has completed a course of Ceftin  - sputum culture normal oropharyngeal. Schedule guaifenesin. Incentive spirometry.  --On Dysphagia 3 diet.  -Started on IV Vancomycin and Cefepime 6-12 due to leukocytosis to cover for PNA. Day antibiotics.  -Will change to Doxy for 5 more days. WBC normalized.   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 Biopsy positive for adenocarcinoma  -Oncology consulted. Plan to have PET Scan fist prior to any surgical intervention.   Atrial fibrillation with RVR: Paroxysmal  - Heart rate now again controlled, currently in normal sinus rhythm, Cardizem drip being rapidly weaned to off  - Not on any anticoagulation at this time due to anemia, newly diagnosed colon cancer - CHADSVASC is 5 so would like to initiate anticoag as soon as safely feasible  - Echo done outpatient EF 65%. LV ef normal, no wma, no valvular abn  -TSH normal, d-dimer was elevated, CT angiography chest negative for pulmonary embolism  -Had episode of A fib RVR 6-12, Cardizem was increased. Become hypotensive. Started on amiodarone 6-13.   Constipation ? Per nurse patient had A BM. KUB no evidence of obstruction.   NSTEMI  -No chest pain, Troponins trending down.  -Stress Myoview test showed moderate size and intensity, partially reversible inferolateral perfusion defect (SDS 4), consistent with  ischemia - Extent 19%. LVEF 54%, normal wall motion  -Cardiac cath revealed no signif coronary lesion   Acute on chronic diastolic CHF  -CHF likely precipitated due to rapid A. Fib - appears well compensated at this time  -will hold lasix due to hypotension. Might need to be resume.   Hypothyroidism  - TSH 1.69, continue Synthroid   Anxiety, depression Bipolar I disorder  - Continue Klonopin per her home regimen, continue Zoloft   GERD  -continue PPI  Chronic dysphagia  - Stable      Consultations: Cardiology GI General surgery Oncology   Discharge Exam: Filed Vitals:   04/26/14 0434  BP: 123/81  Pulse: 114  Temp: 98.1 F (36.7 C)  Resp: 28    General: Alert in no distress.  Cardiovascular: S , 1 S 2 IRR Respiratory: Few ronchus, no crackles.   Discharge Instructions You were cared for by a hospitalist during your hospital stay. If you have any questions about your discharge medications or the care you received while you were in the hospital after you are discharged, you can call the unit and asked to speak with the hospitalist on call if the hospitalist that took care of you is not available. Once you are discharged, your primary care physician will handle any further medical issues. Please note that NO REFILLS for any discharge  medications will be authorized once you are discharged, as it is imperative that you return to your primary care physician (or establish a relationship with a primary care physician if you do not have one) for your aftercare needs so that they can reassess your need for medications and monitor your lab values.  Discharge Instructions   Diet - low sodium heart healthy    Complete by:  As directed   800 CC water restriction.     Increase activity slowly    Complete by:  As directed             Medication List    STOP taking these medications       aspirin 325 MG tablet     cefUROXime 500 MG tablet  Commonly known as:  CEFTIN      furosemide 20 MG tablet  Commonly known as:  LASIX     ibuprofen 200 MG tablet  Commonly known as:  ADVIL,MOTRIN     LORazepam 0.5 MG tablet  Commonly known as:  ATIVAN     naproxen sodium 220 MG tablet  Commonly known as:  ANAPROX     potassium chloride SA 20 MEQ tablet  Commonly known as:  K-DUR,KLOR-CON      TAKE these medications       acetaminophen 325 MG tablet  Commonly known as:  TYLENOL  Take 325 mg by mouth daily as needed for mild pain. One daily as needed     albuterol 108 (90 BASE) MCG/ACT inhaler  Commonly known as:  PROVENTIL HFA;VENTOLIN HFA  Inhale 2 puffs into the lungs every 6 (six) hours as needed for wheezing or shortness of breath.     amiodarone 200 MG tablet  Commonly known as:  PACERONE  Take 1 tablet (200 mg total) by mouth daily.     atorvastatin 10 MG tablet  Commonly known as:  LIPITOR  Take 1 tablet (10 mg total) by mouth daily at 6 PM.     benzonatate 100 MG capsule  Commonly known as:  TESSALON  Take 1 capsule (100 mg total) by mouth 3 (three) times daily as needed for cough.     calcitonin (salmon) 200 UNIT/ACT nasal spray  Commonly known as:  MIACALCIN/FORTICAL  Place 1 spray into alternate nostrils daily. Use one spray in one nostril once daily. Alternate nostrils daily     calcium-vitamin D 500-200 MG-UNIT per tablet  Commonly known as:  OSCAL WITH D  Take 1 tablet by mouth 3 (three) times daily. Take one tablet three times daily for calcium supplementation     clonazePAM 0.5 MG tablet  Commonly known as:  KLONOPIN  Take 1 tablet (0.5 mg total) by mouth at bedtime.     clonazePAM 0.25 MG disintegrating tablet  Commonly known as:  KLONOPIN  Take 1 tablet (0.25 mg total) by mouth every 4 (four) hours as needed (breakthrough anxiety).     COMBIGAN 0.2-0.5 % ophthalmic solution  Generic drug:  brimonidine-timolol  Place 1 drop into both eyes every 12 (twelve) hours.     diltiazem 180 MG 24 hr capsule  Commonly known as:   CARDIZEM CD  Take 1 capsule (180 mg total) by mouth daily.     doxycycline 100 MG tablet  Commonly known as:  VIBRA-TABS  Take 1 tablet (100 mg total) by mouth 2 (two) times daily.     feeding supplement (RESOURCE BREEZE) Liqd  Take 1 Container by mouth 3 (three) times daily between  meals.     ferrous sulfate 325 (65 FE) MG tablet  Take 325 mg by mouth 2 (two) times daily with a meal.     guaiFENesin 200 MG tablet  Take 2 tablets (400 mg total) by mouth 2 (two) times daily.     ipratropium-albuterol 0.5-2.5 (3) MG/3ML Soln  Commonly known as:  DUONEB  Take 3 mLs by nebulization every 6 (six) hours as needed (cough/wheeze.). AND every 6 hours for 5 days starting 04/09/14.  (Last dose 04/14/14 at 0000.)     lamoTRIgine 200 MG tablet  Commonly known as:  LAMICTAL  Take 200 mg by mouth daily.     levalbuterol 0.63 MG/3ML nebulizer solution  Commonly known as:  XOPENEX  Take 3 mLs (0.63 mg total) by nebulization every 6 (six) hours as needed for wheezing or shortness of breath.     levothyroxine 88 MCG tablet  Commonly known as:  SYNTHROID, LEVOTHROID  Take 88 mcg by mouth daily before breakfast.     metoprolol succinate 50 MG 24 hr tablet  Commonly known as:  TOPROL-XL  Take 1 tablet (50 mg total) by mouth daily. Take with or immediately following a meal.     multivitamin-lutein Caps capsule  Take 1 capsule by mouth daily.     nitroGLYCERIN 0.4 MG SL tablet  Commonly known as:  NITROSTAT  Place 0.4 mg under the tongue every 5 (five) minutes as needed for chest pain.     omeprazole 20 MG capsule  Commonly known as:  PRILOSEC  Take 20 mg by mouth 2 (two) times daily before a meal.     pilocarpine 5 MG tablet  Commonly known as:  SALAGEN  Take 5 mg by mouth 3 (three) times daily.     saccharomyces boulardii 250 MG capsule  Commonly known as:  FLORASTOR  Take 250 mg by mouth 2 (two) times daily. For 10 days.     senna 8.6 MG Tabs tablet  Commonly known as:  SENOKOT  Take  2 tablets by mouth at bedtime.     sertraline 25 MG tablet  Commonly known as:  ZOLOFT  Take 1 tablet (25 mg total) by mouth daily.     vitamin C 1000 MG tablet  Take 1,000 mg by mouth daily with breakfast.       Allergies  Allergen Reactions  . Levaquin [Levofloxacin In D5w] Hives  . Penicillins Rash       Follow-up Information   Follow up with Betsy Coder, MD In 1 week.   Specialty:  Oncology   Contact information:   [place] [address] [phone]       Follow up with CROITORU,MIHAI, MD In 1 week.   Specialty:  Cardiology   Contact information:   [address] [place] [address] [phone]        The results of significant diagnostics from this hospitalization (including imaging, microbiology, ancillary and laboratory) are listed below for reference.    Significant Diagnostic Studies: Ct Abdomen Pelvis Wo Contrast  04/17/2014   CLINICAL DATA:  Anemia, evaluate for colon mass  EXAM: CT ABDOMEN AND PELVIS WITHOUT CONTRAST  TECHNIQUE: Multidetector CT imaging of the abdomen and pelvis was performed following the standard protocol without IV contrast.  COMPARISON:  Partial comparison to CT chest dated 04/14/2014 and 10/31/2011  FINDINGS: Small left pleural effusion, possibly partially loculated. 7 x 11 mm left lower lobe nodule (series 4/image 2).  Cardiomegaly.  Unenhanced liver,  spleen, and right adrenal gland are within normal limits.  Pancreas is notable for a coarse calcification in the pancreatic head/uncinate process (series 2/image 31), nonspecific, but likely incidental.  3.5 x 2.3 cm left adrenal mass (series 2/ image 19), unchanged since 2012. While technically indeterminate, stability favors a benign etiology such as an adrenal adenoma.  Layering gallstones (series 2/ image 33), without associated inflammatory changes. No intrahepatic or extrahepatic ductal dilatation.  Scattered bilateral renal cysts, including a 2.4 x  3.4 cm mildly irregular cyst in the left lower pole (series 2/image 34). Additional 13 mm probable hemorrhagic cysts in the left lower pole (series 2/image 37). No renal calculi or hydronephrosis.  No evidence of bowel obstruction. 1.9 x 4.3 x 4.6 cm eccentric cecal mass (series 2/image 55), worrisome for primary chronic neoplasm. Extension into the surrounding pericolonic fat is possible. 5 mm in the common node (series 2/image 48), within normal limits for size. Diverticulosis, without associated inflammatory changes.  Atherosclerotic calcifications of the abdominal aorta and branch vessels.  No abdominopelvic ascites.  Small retroperitoneal lymph nodes which do not meet pathologic CT size criteria.  Uterus and bilateral ovaries are unremarkable.  Bladder is decompressed with indwelling Foley catheter and moderate nondependent gas.  Degenerative changes of the visualized thoracolumbar spine. Prior bilateral inferior pubic ramus fractures. Right total hip arthroplasty. Mild degenerative changes of the left hip.  IMPRESSION: 4.6 cm eccentric cecal mass, worrisome for primary colonic neoplasm. Colonoscopy is suggested for further evaluation.  7 x 11 mm left lower lobe nodule, metastasis not excluded.  3.5 cm left adrenal mass, unchanged since 2012, likely reflecting a benign adrenal adenoma.  Small left pleural effusion, possibly partially loculated.  Additional ancillary findings as above.   Electronically Signed   By: Sriyesh  Krishnan M.D.   On: 04/17/2014 09:58   Dg Chest 2 View  04/13/2014   CLINICAL DATA:  DEHYDRATION  EXAM: CHEST - 2 VIEW  COMPARISON:  11/15/2013  FINDINGS: Stable cardiomegaly. Improving left lower lung airspace disease, with mild scattered residual airspace opacities. There is some residual linear scarring or subsegmental atelectasis in the left mid lung. Right lung remains clear. Atheromatous aorta. . No effusion. Degenerative changes in bilateral shoulders.  IMPRESSION: 1. Partial  improvement in the left lower lung airspace disease. 2. Stable cardiomegaly   Electronically Signed   By: Daniel  Hassell M.D.   On: 04/13/2014 15:56   Dg Abd 1 View  04/24/2014   CLINICAL DATA:  Constipation  EXAM: ABDOMEN - 1 VIEW  COMPARISON:  Abdominal CT 04/17/2014  FINDINGS: There is no evidence of bowel obstruction. Stool volume is within normal limits. No suspicious intra-abdominal mass effect or calcification. Persistent small pleural effusion or pleural thickening at the peripheral left base.  Diffuse and advanced degenerative disc and facet disease. Right hip arthroplasty; lucencies in the right acetabulum likely represent pre-existing subchondral cysts. Remote obturator ring fractures.  IMPRESSION: Nonobstructive bowel gas pattern. Stool volume is within normal limits.   Electronically Signed   By: Jonathan  Watts M.D.   On: 04/24/2014 11:26   Ct Angio Chest Pe W/cm &/or Wo Cm  04/14/2014   CLINICAL DATA:  Acute respiratory failure with shortness of breath. Evaluate for pulmonary embolism.  EXAM: CT ANGIOGRAPHY CHEST WITH CONTRAST  TECHNIQUE: Multidetector CT imaging of the chest was performed using the standard protocol during bolus administration of intravenous contrast. Multiplanar CT image reconstructions and MIPs were obtained to evaluate the vascular anatomy.  CONTRAST:  100mL OMNIPAQUE IOHEXOL   350 MG/ML SOLN  COMPARISON:  10/31/2011  FINDINGS: THORACIC INLET/BODY WALL:  No acute abnormality.  MEDIASTINUM:  Mild cardiomegaly. No pericardial effusion. Diffuse atherosclerotic calcification, including the coronary arteries. There is intermittent respiratory motion, which limits sensitivity for detecting pulmonary embolus at the affected levels. Exam is overall diagnostic, and there is no pulmonary embolism identified. No aortic aneurysm or dissection. No lymphadenopathy. Negative esophagus.  LUNG WINDOWS:  There is a small, layering left pleural effusion. The fluid is water density. There is mild  associated atelectasis. Although limited by motion artifact, there is a nodule in the upper lingula which measures 7 mm. 13 nodular density in the posterior basilar segment left lower lobe, subpleural. In the medial basilar segment left lower lobe on image 52, there is a 12 mm nodule. Centrilobular emphysema.  UPPER ABDOMEN:  A soft tissue density nodule arising from the left adrenal gland is stable in size, up to 3.5 cm. Stability is consistent with benign lesion, most likely lipid poor adenoma.  OSSEOUS:  No acute fracture.  No suspicious lytic or blastic lesions.  Review of the MIP images confirms the above findings.  IMPRESSION: 1. Negative for pulmonary embolism. 2. Small left pleural effusion. 3. Three left lung nodules, measuring up to 13 mm. Bronchogenic carcinoma is a diagnostic possibility, recommend follow-up chest CT in 3 months. 4. Emphysema.   Electronically Signed   By: Jonathan  Watts M.D.   On: 04/14/2014 21:12   Nm Myocar Single W/spect W/wall Motion And Ef  04/16/2014   HISTORY OF PRESENT ILLNESS: Nuclear Med Background  Indication for Stress Test:  NSTEMI, dyspnea  History: 80 yo female with multiple and complex medical problems including HTN, HLD, dementia, CHF, CAD- I see no record for CAD., hypothyroidism, who presented to [facility] ED with main concern of several days duration of progressively worsening fatigue, lethargy, shortness of breath. Please note that pt is unable to provide details and daughter at bedside was primary historian. Pt apparently treated for PNA in Jan 2015 and again last week and is still on ABX but per daughter, pt has been moor sleepy and not eating much. No reported chest pain or abdominal pain, no urinary concerns. Pt apparently has chronic dyspnea but slightly worse over the past several days. In ED, pt noted to be in atrial fibrillation with HR in 150's, initial troponin elevated at 1.76. Na 122. Please note that pt has been denying chest pain since arrival to the ED.  Cardiology has been asked to see.  Cardiac Risk Factors:  HTN, HPL, CHF, CAD  Symptoms:  Dyspnea, fatigue, NSTEMI  PROCEDURE: Nuclear Pre-Procedure  Caffeine/Decaff Intake:  NPO After: Midnight.  Lungs:  O2 Stat:  IV 0.9% NS with Angio Cath:  Chest Size (in):  Cup Size:  Height: 5'4"  Weight:  144 lbs  BMI:  Tech Comments:  Nuclear Med Study  1 or 2 day study: 1  Stress Test Type: Lexiscan  Reading MD: Hilty  Order Authorizing Provider: Brackbill  Resting Radionuclide: Sestamibi  Resting Radionuclide Dose:  10 mCi  Stress Radionuclide: Sestamibi  Stress Radionuclide Dose:  30 mCi  Stress Protocol  Rest HR: 90  Stress HR: 75  Rest BP: 195/67  Stress BP: 181/68  Exercise Time (min):  METS:  Dose of Adenosine (mg):  Dose of Lexiscan:  0.4 mg  Dose of Atropine (mg):  Dose of Dobutamine:  Stress Test Technologist:  Nuclear Technologist:  Rest Procedure:  Stress Procedure:  Transient Ischemic Dilatation (Normal <1.22):   0.86  Lung/Heart Ratio (Normal <0.45): 0.47  QGS EDV:  47 ml  QGS ESV:  21 ml  LV Ejection Fraction: 54%  Rest ECG: Normal sinus rhythm  Stress ECG:  No lexiscan EKG changes  Raw Data Images:  No significant extracardiac activity  Stress Images:  Moderate sized and intensity inferolateral defect  Rest Images:  Mild inferolateral defect  Subtraction (SDS): 4  IMPRESSION: Exercise Capacity: N/A  BP Response: Normal  Clinical Symptoms: None  ECG Impression:  No lexiscan EKG changes  Comparison with Prior Nuclear Study: None  Final Impression:  Moderate size and intensity, partially reversible inferolateral perfusion defect (SDS 4), consistent with ischemia - Extent 19%. LVEF 54%, normal wall motion.   Electronically Signed   By: Kenneth C Hilty   On: 04/16/2014 14:20   Dg Chest Port 1 View  04/22/2014   CLINICAL DATA:  Short of breath.  Rule out infiltrate  EXAM: PORTABLE CHEST - 1 VIEW  COMPARISON:  04/14/2014  FINDINGS: No change in bibasilar linear densities consistent with atelectasis. Increased left  pleural effusion. Negative for edema or pneumonia.  IMPRESSION: Bibasilar atelectasis unchanged.  Increase in small left effusion   Electronically Signed   By: Charles  Clark M.D.   On: 04/22/2014 07:23   Dg Chest Port 1 View  04/14/2014   CLINICAL DATA:  Shortness of breath  EXAM: PORTABLE CHEST - 1 VIEW  COMPARISON:  04/13/2014  FINDINGS: Mild cardiomegaly which is stable from yesterday. Mediastinal contours are distorted by leftward rotation. There are streaky lung opacities at the bases, left more than right. No change from prior to suggest pneumonia, edema, effusion, or pneumothorax.  IMPRESSION: Unchanged bibasilar lung opacities, morphology favoring atelectasis.   Electronically Signed   By: Jonathan  Watts M.D.   On: 04/14/2014 07:08    Microbiology: Recent Results (from the past 240 hour(s))  CULTURE, EXPECTORATED SPUTUM-ASSESSMENT     Status: None   Collection Time    04/23/14  8:20 AM      Result Value Ref Range Status   Specimen Description SPUTUM   Final   Special Requests Normal   Final   Sputum evaluation     Final   Value: THIS SPECIMEN IS ACCEPTABLE. RESPIRATORY CULTURE REPORT TO FOLLOW.   Report Status 04/23/2014 FINAL   Final  CULTURE, RESPIRATORY (NON-EXPECTORATED)     Status: None   Collection Time    04/23/14  8:20 AM      Result Value Ref Range Status   Specimen Description SPUTUM   Final   Special Requests NONE   Final   Gram Stain     Final   Value: FEW WBC PRESENT,BOTH PMN AND MONONUCLEAR     RARE SQUAMOUS EPITHELIAL CELLS PRESENT     RARE GRAM POSITIVE COCCI     IN PAIRS RARE GRAM NEGATIVE RODS     Performed at [facility]   Culture     Final   Value: NORMAL OROPHARYNGEAL FLORA     Performed at [facility]   Report Status 04/25/2014 FINAL   Final     Labs: Basic Metabolic Panel:  Recent Labs Lab 04/21/14 0830  04/23/14 1717 04/24/14 0413 04/24/14 1951 04/25/14 0443 04/26/14 0405  NA 128*  < > 117* 119* 119* 121* 126*  K 5.3  <  > 4.0 4.0 4.0 3.9 3.8  CL 93*  < > 83* 85* 87* 87* 93*  CO2 20  < > 18* 18* 15* 16* 20    GLUCOSE 131*  < > 219* 163* 216* 137* 114*  BUN 12  < > 17 16 14 13 11  CREATININE 0.98  < > 1.10 1.04 0.84 0.89 0.88  CALCIUM 9.1  < > 8.4 8.5 8.4 8.5 8.8  MG 1.7  --   --   --   --   --   --   < > = values in this interval not displayed. Liver Function Tests: No results found for this basename: AST, ALT, ALKPHOS, BILITOT, PROT, ALBUMIN,  in the last 168 hours No results found for this basename: LIPASE, AMYLASE,  in the last 168 hours No results found for this basename: AMMONIA,  in the last 168 hours CBC:  Recent Labs Lab 04/23/14 1325 04/24/14 0413 04/25/14 0443  WBC 16.5* 13.9* 9.1  HGB 10.7* 10.8* 10.3*  HCT 31.8* 32.4* 30.4*  MCV 82.6 83.1 82.8  PLT 467* 536* 487*   Cardiac Enzymes: No results found for this basename: CKTOTAL, CKMB, CKMBINDEX, TROPONINI,  in the last 168 hours BNP: BNP (last 3 results)  Recent Labs  11/15/13 1325 11/17/13 0800 04/13/14 1519  PROBNP 4643.0* 10172.0* 3909.0*   CBG:  Recent Labs Lab 04/24/14 0015  GLUCAP 106*       Signed:  Regalado, Belkys A  [facility] 04/26/2014, 10:18 AM    

## 2014-04-26 NOTE — Progress Notes (Signed)
Patient is set to discharge to [facility] today. Patient, husband & daughter at bedside aware. Discharge packet in [place], [place] aware. PTAR scheduled for 2:00pm pickup (Service Request Id: [id number]).   Raynaldo Opitz, [facility] Clinical Social Worker cell #: [phone]

## 2014-04-26 NOTE — Progress Notes (Signed)
Attempted to call report to [facility], but no one answered the phone and I got a voicemail. I left a brief report on the voicemail and left my contact information in case they need to call me back. Will give a report if they return my call. Pt stable and ready for transport at 2pm.  Othella Boyer [facility] 04/26/2014 12:17 PM

## 2014-04-26 NOTE — Progress Notes (Signed)
Physical Therapy Treatment Patient Details Name: CAILIN GEBEL MRN: [id number] DOB: 1927/10/18 Today's Date: 05-01-2014    History of Present Illness 80 yo female admitted with A fib with RVR, NSTEMI, anemia, new diagnosis of cecal mass/carcinoma with possible lung mets. Hx of HTN, dementia, CHF, CAD. Pt is from [facility].     PT Comments    Pt required increased time for mobility due to fatigue/rest breaks.  Pt plans to d/c to SNF today per caregiver present in room.  Follow Up Recommendations  SNF     Equipment Recommendations  None recommended by PT    Recommendations for Other Services       Precautions / Restrictions Precautions Precautions: Fall    Mobility  Bed Mobility               General bed mobility comments: pt up in recliner on arrival  Transfers Overall transfer level: Needs assistance Equipment used: Rolling walker (2 wheeled) Transfers: Sit to/from Stand Sit to Stand: Min assist         General transfer comment: assist to steady initially, increased use of UE on armrests  Ambulation/Gait Ambulation/Gait assistance: Min assist;Min guard Ambulation Distance (Feet): 40 Feet (x2) Assistive device: Rolling walker (2 wheeled) Gait Pattern/deviations: Step-through pattern;Trunk flexed;Decreased stride length     General Gait Details: slow gait speed, one instance of LOB requiring assist to correct, first 40 feet using IV pole and 1 HHA, pt rested and attempted to use BSC (unable to void) then ambulated another 40 feet with RW, verbal cues for use of RW and placement   Stairs            Wheelchair Mobility    Modified Rankin (Stroke Patients Only)       Balance                                    Cognition Arousal/Alertness: Awake/alert Behavior During Therapy: WFL for tasks assessed/performed Overall Cognitive Status: History of cognitive impairments - at baseline                      Exercises       General Comments        Pertinent Vitals/Pain No pain reported    Home Living                      Prior Function            PT Goals (current goals can now be found in the care plan section) Progress towards PT goals: Progressing toward goals    Frequency  Min 3X/week    PT Plan Current plan remains appropriate    Co-evaluation             End of Session Equipment Utilized During Treatment: Gait belt Activity Tolerance: Patient limited by fatigue Patient left: in chair;with call bell/phone within reach;with family/visitor present     Time: 1005-1030 PT Time Calculation (min): 25 min  Charges:  $Gait Training: 23-37 mins                    G Codes:      Corinne Goucher,KATHrine E May 01, 2014, 11:14 AM Carmelia Bake, PT, DPT 05-01-14 Pager: [phone]

## 2014-04-26 NOTE — Progress Notes (Signed)
UR COMPLETED  

## 2014-04-26 NOTE — Progress Notes (Signed)
RN, Geanie Logan returned phone call to hospital RN. Report given via telephone. Pt still stable and awaiting transport.  Othella Boyer [facility] 04/26/2014 1:52 PM

## 2014-04-28 ENCOUNTER — Non-Acute Institutional Stay (SKILLED_NURSING_FACILITY): Payer: Medicare Other | Admitting: Nurse Practitioner

## 2014-04-28 ENCOUNTER — Telehealth: Payer: Self-pay | Admitting: Oncology

## 2014-04-28 ENCOUNTER — Encounter: Payer: Self-pay | Admitting: Nurse Practitioner

## 2014-04-28 DIAGNOSIS — D649 Anemia, unspecified: Secondary | ICD-10-CM

## 2014-04-28 DIAGNOSIS — E871 Hypo-osmolality and hyponatremia: Secondary | ICD-10-CM

## 2014-04-28 DIAGNOSIS — I480 Paroxysmal atrial fibrillation: Secondary | ICD-10-CM

## 2014-04-28 DIAGNOSIS — K5900 Constipation, unspecified: Secondary | ICD-10-CM

## 2014-04-28 DIAGNOSIS — J449 Chronic obstructive pulmonary disease, unspecified: Secondary | ICD-10-CM

## 2014-04-28 DIAGNOSIS — I4891 Unspecified atrial fibrillation: Secondary | ICD-10-CM

## 2014-04-28 DIAGNOSIS — R1319 Other dysphagia: Secondary | ICD-10-CM

## 2014-04-28 DIAGNOSIS — I5032 Chronic diastolic (congestive) heart failure: Secondary | ICD-10-CM

## 2014-04-28 DIAGNOSIS — F411 Generalized anxiety disorder: Secondary | ICD-10-CM

## 2014-04-28 DIAGNOSIS — I509 Heart failure, unspecified: Secondary | ICD-10-CM

## 2014-04-28 DIAGNOSIS — I214 Non-ST elevation (NSTEMI) myocardial infarction: Secondary | ICD-10-CM

## 2014-04-28 DIAGNOSIS — F419 Anxiety disorder, unspecified: Secondary | ICD-10-CM

## 2014-04-28 DIAGNOSIS — C189 Malignant neoplasm of colon, unspecified: Secondary | ICD-10-CM

## 2014-04-28 DIAGNOSIS — E039 Hypothyroidism, unspecified: Secondary | ICD-10-CM

## 2014-04-28 DIAGNOSIS — K219 Gastro-esophageal reflux disease without esophagitis: Secondary | ICD-10-CM

## 2014-04-28 NOTE — Assessment & Plan Note (Signed)
Atrial fibrillation with RVR: Paroxysmal  - Heart rate now again controlled, currently in normal sinus rhythm, Cardizem drip being rapidly weaned to off  - Not on any anticoagulation at this time due to anemia, newly diagnosed colon cancer - CHADSVASC is 5 so would like to initiate anticoag as soon as safely feasible  - Echo done outpatient EF 65%. LV ef normal, no wma, no valvular abn  -TSH normal, d-dimer was elevated, CT angiography chest negative for pulmonary embolism  -Had episode of A fib RVR 6-12, Cardizem was increased. Become hypotensive. Started on amiodarone 6-13.

## 2014-04-28 NOTE — Assessment & Plan Note (Signed)
-  no chest pain, Troponins trending down.  -Stress Myoview test showed moderate size and intensity, partially reversible inferolateral perfusion defect (SDS 4), consistent with ischemia - Extent 19%. LVEF 54%, normal wall motion  -Cardiac cath revealed no signif coronary lesion

## 2014-04-28 NOTE — Assessment & Plan Note (Signed)
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 Biopsy positive for adenocarcinoma  -Oncology consulted. Plan to have PET Scan fist prior to any surgical intervention. GI General surgery Oncology

## 2014-04-28 NOTE — Assessment & Plan Note (Signed)
Might explain weakness. Treated initially with IV fluids, no significantly improved. Nephrologist consulted who recommend water restrictions. Sodium increase to 126 from 117. Continue with water restriction. She received 2 doses of sodium bicarb. Needs repeat B-met.

## 2014-04-28 NOTE — Assessment & Plan Note (Signed)
Stable, takes Senna I qhs.

## 2014-04-28 NOTE — Telephone Encounter (Signed)
CALLED FRIENDS HOME AND  LEFT MESSAGE FOR APPT ON 06/30 @ 2:15 W/LISA THOMAS.

## 2014-04-28 NOTE — Assessment & Plan Note (Signed)
-  CHF likely precipitated due to rapid A. Fib - appears well compensated at this time  -will hold lasix due to hypotension. Might need to be resume.

## 2014-04-28 NOTE — Assessment & Plan Note (Addendum)
Bronchitis, chronic//PNA  - Recently treated for pneumonia, continue Xopenex nebs, Mucinex  - has completed a course of Ceftin  - sputum culture normal oropharyngeal. Schedule guaifenesin. Incentive spirometry.  - Doxy for 5 more days.

## 2014-04-28 NOTE — Assessment & Plan Note (Signed)
F/u Psychiatrist, currently on Zoloft 25mg  and Clonazepam 0.25mg  q4hr prn, Lamictal 200mg  daily, Lorazepam 0.5mg  q4h prn. off  Abilify andTrazodone. Stabl

## 2014-04-28 NOTE — Progress Notes (Signed)
Patient ID: Jenny [name], female   DOB: 1927-02-01, 80 y.o.   MRN: [id number]   Code Status: Full Code MOST  Allergies  Allergen Reactions  . Levaquin [Levofloxacin In D5w] Hives  . Penicillins Rash    Chief Complaint  Patient presents with  . Medical Management of Chronic Issues  . Hospitalization Follow-up    HPI: Patient is a 80 y.o. female seen in the AL at [facility] today for evaluation of CHF, hyponatremia, anemia, pneumonitis,  and chronic medical conditions.     Hospitalized 04/13/2014-04/26/2014 for hyponatremia (Need Water restriction 800 CC).  A-fib(rate controlled), PNA(complete 5 day course of Doxy), NSTEMI (non-ST elevated myocardial infarction)     CHF(may resume Furosemide), incidental finding of colon cancer(follow up with DR Junious Silk, needs PET Scan.)   The Pt presented to ED with progressive fatigue, low Na, and SOB. She was  treated for PNA recently and is still on ABX. The patient was found to have atrial fibrillation with HR in 150's, initial troponin elevated at 1.76. Na 122.    The patient was placed on a Cardizem drip. Cardiology was consulted. The patient was noted to have NSTEMI with elevated troponin. A nuclear medicine stress test 6/5 showed a moderate sized, partially reversible inferolateral perfusion defect, consistent with ischemia, EF 54%.   Patient also developed anemia during the hospitalization. Cardiology recommended an anemia workup prior to cardiac cath. EGD was normal. CT of the abdomen and pelvis revealed a cecal mass, and the patient underwent coloscopy on 6/8 which showed a large exophytic bleeding mass encompassing the ileocecal valve. General surgery was consulted. Patient was diagnosed with colon cancer.   She also developed worsening of her hyponatremia. Her sodium level has improved with water restriction. Her lasix was on hold due to hypotension.    Hospitalized 11/15/13-11/20/13 for PNA.    Problem List Items Addressed This Visit   PAF (paroxysmal atrial fibrillation)     Atrial fibrillation with RVR: Paroxysmal  - Heart rate now again controlled, currently in normal sinus rhythm, Cardizem drip being rapidly weaned to off  - Not on any anticoagulation at this time due to anemia, newly diagnosed colon cancer - CHADSVASC is 5 so would like to initiate anticoag as soon as safely feasible  - Echo done outpatient EF 65%. LV ef normal, no wma, no valvular abn  -TSH normal, d-dimer was elevated, CT angiography chest negative for pulmonary embolism  -Had episode of A fib RVR 6-12, Cardizem was increased. Become hypotensive. Started on amiodarone 6-13.      Other dysphagia     Stable. Dysphagia III diet.     NSTEMI (non-ST elevated myocardial infarction)     -no chest pain, Troponins trending down.  -Stress Myoview test showed moderate size and intensity, partially reversible inferolateral perfusion defect (SDS 4), consistent with ischemia - Extent 19%. LVEF 54%, normal wall motion  -Cardiac cath revealed no signif coronary lesion      Hypothyroidism (Chronic)     - TSH 1.69, continue Synthroid      Hyponatremia     Might explain weakness. Treated initially with IV fluids, no significantly improved. Nephrologist consulted who recommend water restrictions. Sodium increase to 126 from 117. Continue with water restriction. She received 2 doses of sodium bicarb. Needs repeat B-met.    GERD (gastroesophageal reflux disease)     Stable, continue Omeprazole 81m daily. MBSS unremarkable.       COPD GOLD II (Chronic)     Bronchitis,  chronic//PNA  - Recently treated for pneumonia, continue Xopenex nebs, Mucinex  - has completed a course of Ceftin  - sputum culture normal oropharyngeal. Schedule guaifenesin. Incentive spirometry.  - Doxy for 5 more days.     Constipation     Stable, takes Senna I qhs.       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 Biopsy positive for adenocarcinoma  -Oncology consulted. Plan to have PET Scan fist prior to any surgical intervention. GI General surgery Oncology      Chronic diastolic congestive heart failure     -CHF likely precipitated due to rapid A. Fib - appears well compensated at this time  -will hold lasix due to hypotension. Might need to be resume.     Anxiety     F/u Psychiatrist, currently on Zoloft 16m and Clonazepam 0.213mq4hr prn, Lamictal 20041maily, Lorazepam 0.5mg60mh prn. off  Abilify andTrazodone. Stabl    Anemia     Continue Fe       Review of Systems:  Review of Systems  Constitutional: Positive for malaise/fatigue. Negative for fever, chills, weight loss and diaphoresis.       Generalized weakness  HENT: Positive for hearing loss. Negative for congestion, ear pain and sore throat.   Eyes: Negative for pain, discharge and redness.  Respiratory: Negative for cough, hemoptysis, sputum production, shortness of breath and wheezing.   Cardiovascular: Positive for leg swelling. Negative for chest pain, palpitations, orthopnea, claudication and PND.       Trace BLE  Gastrointestinal: Negative for heartburn, nausea, vomiting, abdominal pain, diarrhea, constipation and blood in stool.       Difficulty swallowing.   Genitourinary: Positive for frequency. Negative for dysuria, urgency, hematuria and flank pain.  Musculoskeletal: Positive for joint pain. Negative for back pain, falls, myalgias and neck pain.  Skin: Negative for itching and rash.  Neurological: Positive for weakness. Negative for dizziness, tingling, tremors, sensory change, speech change, focal weakness, seizures, loss of consciousness and headaches.  Endo/Heme/Allergies: Negative for environmental  allergies and polydipsia. Does not bruise/bleed easily.  Psychiatric/Behavioral: Positive for depression and memory loss. Negative for hallucinations. The patient is nervous/anxious. The patient does not have insomnia.      Past Medical History  Diagnosis Date  . Hypertension   . Coronary artery disease   . Heart murmur   . Hypothyroidism 05-02-12    tx. Synthroid  . Headache(784.0) 05-02-12    past hx. migrianes-many yrs ago  . Arthritis 05-02-12    osteoarthritis-rt. hip  . Pneumonia 05-02-12    mild case  . 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 Acute upper respiratory infections of unspecified site 2014  . Unspecified venous (peripheral) insufficiency 2013  . Senile osteoporosis 2013  . Unspecified closed fracture of pelvis 10/13/2012  . Cellulitis and abscess of leg, except foot 2013  . Altered mental status 2013  . Chronic pain syndrome 2013  . Urinary tract infection, site not specified   . Hyposmolality and/or hyponatremia 05/2012  . Congestive heart failure, unspecified 05/2012  . Pneumonia, organism unspecified 05/2012  . Pressure ulcer, buttock(707.05) 05/2012  . Hypoxemia 05/2012  . Reflux esophagitis 3013  .  Unspecified constipation 2013  . Acute posthemorrhagic anemia 05/2012  . Coronary atherosclerosis of native coronary artery 05/2012  . Pain in joint, pelvic region and thigh 04/2012  . Dysphagia, unspecified(787.20) 03/2012  . Benign neoplasm of adrenal gland 10/2011  . Alzheimer's disease 06/2011  . Other seborrheic keratosis 2012  . Hypertensive renal disease, benign 2012  . Pain in joint, lower leg 2012  . Memory loss 09/2010  . Spinal stenosis, lumbar region, without neurogenic claudication 2011  . Unspecified vitamin D deficiency 2010  . Unspecified disorder of kidney and ureter 2010  . Edema 2010  . Other malaise and fatigue 2009  . Congenital factor VIII disorder 2009  . Senile dementia with delusional features 2008  . Generalized hyperhidrosis 2007  . Irritable  bowel syndrome 2007  . Unspecified chronic bronchitis 2006  . Depression 06/2005     depression only,tx. oral meds  . Shortness of breath 2005  . Anxiety 2004  . Unspecified late effects of cerebrovascular disease 2004  . Migraine with aura, without mention of intractable migraine without mention of status migrainosus 2003  . Tinnitus 2003  . Other symptoms involving cardiovascular system 2003  . Hyperlipidemia 2002   Past Surgical History  Procedure Laterality Date  . Tonsillectomy    . Tubal ligation  05-02-12  .  tummy tuck  05-02-12    many yrs ago  . Cataract extraction, bilateral  05-02-12    bilateral  . Retinal detachment surgery  05-02-12    left  . Total hip arthroplasty  05/09/2012    Procedure: TOTAL HIP ARTHROPLASTY ANTERIOR APPROACH;  Surgeon: Mcarthur Rossetti, MD;  Location: [facility];  Service: Orthopedics;  Laterality: Right;  . Dental surgery  08/11/13    3 teeth surgical removed by Dr. Lewanda Rife  . Esophagogastroduodenoscopy N/A 04/16/2014    Procedure: ESOPHAGOGASTRODUODENOSCOPY (EGD);  Surgeon: Lafayette Dragon, MD;  Location: [facility];  Service: Endoscopy;  Laterality: N/A;  . Colonoscopy N/A 04/19/2014    Procedure: COLONOSCOPY;  Surgeon: [name], MD;  Location: [facility];  Service: Endoscopy;  Laterality: N/A;   Social History:   reports that she quit smoking about 5 years ago. Her smoking use included Cigarettes. She has a 62 pack-year smoking history. She has never used smokeless tobacco. She reports that she does not drink alcohol or use illicit drugs.  Family History  Problem Relation Age of Onset  . Diabetes Father     Medications: Patient's Medications  New Prescriptions   No medications on file  Previous Medications   ACETAMINOPHEN (TYLENOL) 325 MG TABLET    Take 325 mg by mouth daily as needed for mild pain. One daily as needed   ALBUTEROL (PROVENTIL HFA;VENTOLIN HFA) 108 (90 BASE) MCG/ACT INHALER    Inhale 2 puffs into the lungs every 6  (six) hours as needed for wheezing or shortness of breath.   AMIODARONE (PACERONE) 200 MG TABLET    Take 1 tablet (200 mg total) by mouth daily.   ASCORBIC ACID (VITAMIN C) 1000 MG TABLET    Take 1,000 mg by mouth daily with breakfast.   ATORVASTATIN (LIPITOR) 10 MG TABLET    Take 1 tablet (10 mg total) by mouth daily at 6 PM.   BENZONATATE (TESSALON) 100 MG CAPSULE    Take 1 capsule (100 mg total) by mouth 3 (three) times daily as needed for cough.   BRIMONIDINE-TIMOLOL (COMBIGAN) 0.2-0.5 % OPHTHALMIC SOLUTION    Place 1 drop into both eyes  every 12 (twelve) hours.    CALCITONIN, SALMON, (MIACALCIN/FORTICAL) 200 UNIT/ACT NASAL SPRAY    Place 1 spray into alternate nostrils daily. Use one spray in one nostril once daily. Alternate nostrils daily   CALCIUM-VITAMIN D (OSCAL WITH D) 500-200 MG-UNIT PER TABLET    Take 1 tablet by mouth 3 (three) times daily. Take one tablet three times daily for calcium supplementation   CLONAZEPAM (KLONOPIN) 0.25 MG DISINTEGRATING TABLET    Take 1 tablet (0.25 mg total) by mouth every 4 (four) hours as needed (breakthrough anxiety).   CLONAZEPAM (KLONOPIN) 0.5 MG TABLET    Take 1 tablet (0.5 mg total) by mouth at bedtime.   DILTIAZEM (CARDIZEM CD) 180 MG 24 HR CAPSULE    Take 1 capsule (180 mg total) by mouth daily.   DOXYCYCLINE (VIBRA-TABS) 100 MG TABLET    Take 1 tablet (100 mg total) by mouth 2 (two) times daily.   FEEDING SUPPLEMENT, RESOURCE BREEZE, (RESOURCE BREEZE) LIQD    Take 1 Container by mouth 3 (three) times daily between meals.   FERROUS SULFATE 325 (65 FE) MG TABLET    Take 325 mg by mouth 2 (two) times daily with a meal.   GUAIFENESIN 200 MG TABLET    Take 2 tablets (400 mg total) by mouth 2 (two) times daily.   IPRATROPIUM-ALBUTEROL (DUONEB) 0.5-2.5 (3) MG/3ML SOLN    Take 3 mLs by nebulization every 6 (six) hours as needed (cough/wheeze.). AND every 6 hours for 5 days starting 04/09/14.  (Last dose 04/14/14 at 0000.)   LAMOTRIGINE (LAMICTAL) 200 MG  TABLET    Take 200 mg by mouth daily.   LEVALBUTEROL (XOPENEX) 0.63 MG/3ML NEBULIZER SOLUTION    Take 3 mLs (0.63 mg total) by nebulization every 6 (six) hours as needed for wheezing or shortness of breath.   LEVOTHYROXINE (SYNTHROID, LEVOTHROID) 88 MCG TABLET    Take 88 mcg by mouth daily before breakfast.   METOPROLOL SUCCINATE (TOPROL-XL) 50 MG 24 HR TABLET    Take 1 tablet (50 mg total) by mouth daily. Take with or immediately following a meal.   MULTIVITAMIN-LUTEIN (OCUVITE-LUTEIN) CAPS    Take 1 capsule by mouth daily.   NITROGLYCERIN (NITROSTAT) 0.4 MG SL TABLET    Place 0.4 mg under the tongue every 5 (five) minutes as needed for chest pain.   OMEPRAZOLE (PRILOSEC) 20 MG CAPSULE    Take 20 mg by mouth 2 (two) times daily before a meal.   PILOCARPINE (SALAGEN) 5 MG TABLET    Take 5 mg by mouth 3 (three) times daily.   SACCHAROMYCES BOULARDII (FLORASTOR) 250 MG CAPSULE    Take 250 mg by mouth 2 (two) times daily. For 10 days.   SENNA (SENOKOT) 8.6 MG TABS    Take 2 tablets by mouth at bedtime.   SERTRALINE (ZOLOFT) 25 MG TABLET    Take 1 tablet (25 mg total) by mouth daily.  Modified Medications   No medications on file  Discontinued Medications   No medications on file     Physical Exam: Physical Exam  Constitutional:  Elderly. Frail. C/o fatigue. Ambulates with walker.   HENT:  Mouth/Throat: Mucous membranes are dry. No oral lesions. No uvula swelling. No oropharyngeal exudate, posterior oropharyngeal edema, posterior oropharyngeal erythema or tonsillar abscesses.   External auditory canals normal. Partial hearing loss bilaterally.  Eyes: EOM are normal. Pupils are equal, round, and reactive to light.  Wears corrective lenses.  Neck: Neck supple. No JVD present. No tracheal deviation  present. No thyromegaly present.  Cardiovascular: Normal rate and intact distal pulses.  Exam reveals no gallop and no friction rub.   Murmur heard.  Systolic murmur is present with a grade of 3/6    Pulmonary/Chest: Effort normal. No respiratory distress. She has no wheezes. She has rales. She exhibits no tenderness.  Abdominal: Soft. Bowel sounds are normal. She exhibits no distension and no mass. There is no tenderness.  Musculoskeletal: She exhibits edema. She exhibits no tenderness.  Limited range of motion of the right shoulder. Trace edema BLE  Lymphadenopathy:    She has no cervical adenopathy.  Neurological:  Conversational, but there is a significant memory deficit.  Skin:  Multiple senile ecchymoses, otherwise normal  Psychiatric: She has a normal mood and affect. Her behavior is normal.    Filed Vitals:   04/28/14 1238  BP: 124/58  Pulse: 68  Temp: 97.3 F (36.3 C)  TempSrc: Tympanic  Resp: 18   Labs reviewed: Basic Metabolic Panel:  Recent Labs  11/19/13 0515 11/20/13 0534  01/19/14 02/02/14  04/13/14 1841  04/19/14 0350 04/21/14 0830  04/24/14 1951 04/25/14 0443 04/26/14 0405  NA 125* 125*  < > 136* 134*  < >  --   < > 136* 128*  < > 119* 121* 126*  K 4.0 3.6*  < > 4.6 5.2  < >  --   < > 4.7 5.3  < > 4.0 3.9 3.8  CL 86* 88*  --   --   --   < >  --   < > 99 93*  < > 87* 87* 93*  CO2 22 23  --   --   --   < >  --   < > 21 20  < > 15* 16* 20  GLUCOSE 130* 109*  --   --   --   < >  --   < > 102* 131*  < > 216* 137* 114*  BUN 19 18  < > 15 14  < >  --   < > 18 12  < > 14 13 11   CREATININE 0.97 0.96  < > 1.0 1.0  < >  --   < > 1.17* 0.98  < > 0.84 0.89 0.88  CALCIUM 8.5 8.3*  --   --   --   < >  --   < > 9.2 9.1  < > 8.4 8.5 8.8  MG  --   --   --   --   --   --   --   --   --  1.7  --   --   --   --   PHOS  --  2.8  --   --   --   --   --   --   --   --   --   --   --   --   TSH  --   --   --   --  0.72  --  1.690  --   --   --   --   --   --   --   < > = values in this interval not displayed. Liver Function Tests:  Recent Labs  11/15/13 0500 11/16/13 0502 11/20/13 0534 01/19/14 02/02/14 04/13/14 1519  AST 15 18  --  18 15 21   ALT 13 13  --  11 9  15   ALKPHOS 83 74  --  67  69 92  BILITOT <0.2* <0.2*  --   --   --  <0.2*  PROT 6.7 6.0  --   --   --  6.4  ALBUMIN 2.7* 2.2* 2.3*  --   --  3.1*   CBC:  Recent Labs  06/15/13 1531 11/15/13 0500  04/13/14 1519  04/23/14 1325 04/24/14 0413 04/25/14 0443  WBC 9.9 17.3*  < > 7.7  < > 16.5* 13.9* 9.1  NEUTROABS 5.6 13.0*  --  4.8  --   --   --   --   HGB 11.8* 8.0*  < > 9.2*  < > 10.7* 10.8* 10.3*  HCT 35.7* 24.1*  < > 26.5*  < > 31.8* 32.4* 30.4*  MCV 83.1 70.5*  < > 81.8  < > 82.6 83.1 82.8  PLT 469.0* 552*  < > 589*  < > 467* 536* 487*  < > = values in this interval not displayed.  Past Procedures:  11/15/2013 CLINICAL DATA: Cough. Evaluate for pneumonia versus edema. EXAM: CHEST - IMPRESSION: Persistent left lower lobe airspace disease suspicious for pneumonia. No significant pleural effusion.   11/15/2013 CLINICAL DATA: Chest pain EXAM: PORTABLE CHEST - IMPRESSION: Left lower lobe infiltrate, worrisome for pneumonia.   04/03/14 CXR improving bilateral pneumonitis.    04/13/14 CXR IMPRESSION: 1. Partial improvement in the left lower lung airspace disease. 2. Stable cardiomegaly    04/14/14 CXR IMPRESSION: Unchanged bibasilar lung opacities, morphology favoring atelectasis  04/14/14 CT chest angio chest PT IMPRESSION: 1. Negative for pulmonary embolism. 2. Small left pleural effusion. 3. Three left lung nodules, measuring up to 13 mm. Bronchogenic carcinoma is a diagnostic possibility, recommend follow-up chest CT in 3 months. 4. Emphysema   04/17/14 CT abd and pelvis: IMPRESSION: 4.6 cm eccentric cecal mass, worrisome for primary colonic neoplasm. Colonoscopy is suggested for further evaluation. 7 x 11 mm left lower lobe nodule, metastasis not excluded. 3.5 cm left adrenal mass, unchanged since 2012, likely reflecting a benign adrenal adenoma. Small left pleural effusion, possibly partially loculated.   04/24/14 DG abd: IMPRESSION: Nonobstructive bowel gas pattern. Stool volume is  within normal limits.  04/22/14 CXR IMPRESSION: Bibasilar atelectasis unchanged. Increase in small left effusion     Assessment/Plan Colon cancer Colon cancer / Cecal mass: adeno carcinoma by biopsy  -Newly diagnosed on CT abdomen and pelvis  - CEA 48.0  - EGD done as part of anemia workup 6/5 was normal  - C-scope showed large, exophytic, bleeding mass encompassing the ileocecal valve and at least one half the  circumference of the cecum. Biopsy positive for adenocarcinoma  -Oncology consulted. Plan to have PET Scan fist prior to any surgical intervention. GI General surgery Oncology    Hyponatremia Might explain weakness. Treated initially with IV fluids, no significantly improved. Nephrologist consulted who recommend water restrictions. Sodium increase to 126 from 117. Continue with water restriction. She received 2 doses of sodium bicarb. Needs repeat B-met.  COPD GOLD II Bronchitis, chronic//PNA  - Recently treated for pneumonia, continue Xopenex nebs, Mucinex  - has completed a course of Ceftin  - sputum culture normal oropharyngeal. Schedule guaifenesin. Incentive spirometry.  - Doxy for 5 more days.   PAF (paroxysmal atrial fibrillation) Atrial fibrillation with RVR: Paroxysmal  - Heart rate now again controlled, currently in normal sinus rhythm, Cardizem drip being rapidly weaned to off  - Not on any anticoagulation at this time due to anemia, newly diagnosed colon cancer - CHADSVASC is 5 so would like to  initiate anticoag as soon as safely feasible  - Echo done outpatient EF 65%. LV ef normal, no wma, no valvular abn  -TSH normal, d-dimer was elevated, CT angiography chest negative for pulmonary embolism  -Had episode of A fib RVR 6-12, Cardizem was increased. Become hypotensive. Started on amiodarone 6-13.    Constipation Stable, takes Senna I qhs.     NSTEMI (non-ST elevated myocardial infarction) -no chest pain, Troponins trending down.  -Stress Myoview test  showed moderate size and intensity, partially reversible inferolateral perfusion defect (SDS 4), consistent with ischemia - Extent 19%. LVEF 54%, normal wall motion  -Cardiac cath revealed no signif coronary lesion    Chronic diastolic congestive heart failure -CHF likely precipitated due to rapid A. Fib - appears well compensated at this time  -will hold lasix due to hypotension. Might need to be resume.   Hypothyroidism - TSH 1.69, continue Synthroid    Anxiety F/u Psychiatrist, currently on Zoloft 37m and Clonazepam 0.236mq4hr prn, Lamictal 20025maily, Lorazepam 0.5mg21mh prn. off  Abilify andTrazodone. Stabl  GERD (gastroesophageal reflux disease) Stable, continue Omeprazole 20mg48mly. MBSS unremarkable.     Other dysphagia Stable. Dysphagia III diet.   Anemia Continue Fe    Family/ Staff Communication: observe the patient  Goals of Care: SNF  Labs/tests ordered: BMP

## 2014-04-28 NOTE — Assessment & Plan Note (Signed)
Continue Fe °

## 2014-04-28 NOTE — Assessment & Plan Note (Signed)
Stable, continue Omeprazole 20mg  daily. MBSS unremarkable.

## 2014-04-28 NOTE — Assessment & Plan Note (Signed)
-   TSH 1.69, continue Synthroid

## 2014-04-28 NOTE — Assessment & Plan Note (Signed)
Stable. Dysphagia III diet.

## 2014-04-29 ENCOUNTER — Encounter: Payer: Self-pay | Admitting: Internal Medicine

## 2014-04-29 ENCOUNTER — Non-Acute Institutional Stay (SKILLED_NURSING_FACILITY): Payer: Medicare Other | Admitting: Internal Medicine

## 2014-04-29 DIAGNOSIS — R0602 Shortness of breath: Secondary | ICD-10-CM

## 2014-04-29 DIAGNOSIS — I5032 Chronic diastolic (congestive) heart failure: Secondary | ICD-10-CM

## 2014-04-29 DIAGNOSIS — N183 Chronic kidney disease, stage 3 unspecified: Secondary | ICD-10-CM

## 2014-04-29 DIAGNOSIS — R609 Edema, unspecified: Secondary | ICD-10-CM

## 2014-04-29 DIAGNOSIS — D649 Anemia, unspecified: Secondary | ICD-10-CM

## 2014-04-29 DIAGNOSIS — C189 Malignant neoplasm of colon, unspecified: Secondary | ICD-10-CM

## 2014-04-29 DIAGNOSIS — R1319 Other dysphagia: Secondary | ICD-10-CM

## 2014-04-29 DIAGNOSIS — I509 Heart failure, unspecified: Secondary | ICD-10-CM

## 2014-04-29 DIAGNOSIS — R5381 Other malaise: Secondary | ICD-10-CM

## 2014-04-29 DIAGNOSIS — E871 Hypo-osmolality and hyponatremia: Secondary | ICD-10-CM

## 2014-04-29 DIAGNOSIS — R5383 Other fatigue: Secondary | ICD-10-CM

## 2014-04-29 LAB — BASIC METABOLIC PANEL
BUN: 9 mg/dL (ref 4–21)
Creatinine: 0.9 mg/dL (ref 0.5–1.1)
GLUCOSE: 161 mg/dL
Potassium: 5 mmol/L (ref 3.4–5.3)
Sodium: 125 mmol/L — AB (ref 137–147)

## 2014-04-29 LAB — HEPATIC FUNCTION PANEL
ALT: 31 U/L (ref 7–35)
AST: 35 U/L (ref 13–35)
Alkaline Phosphatase: 95 U/L (ref 25–125)
BILIRUBIN, TOTAL: 0.9 mg/dL

## 2014-04-29 LAB — CBC AND DIFFERENTIAL
HCT: 31 % — AB (ref 36–46)
HEMOGLOBIN: 10.1 g/dL — AB (ref 12.0–16.0)
Platelets: 589 10*3/uL — AB (ref 150–399)
WBC: 9.2 10*3/mL

## 2014-04-29 MED ORDER — FUROSEMIDE 40 MG PO TABS
ORAL_TABLET | ORAL | Status: DC
Start: 2014-04-29 — End: 2014-05-10

## 2014-04-29 NOTE — Progress Notes (Signed)
Patient ID: Jenny [name], female   DOB: 1926-12-17, 80 y.o.   MRN: [id number]    Location:  [facility] , room 20  Place of Service: SNF (31)  Extended Emergency Contact Information Primary Emergency Contact: Motsinger,Margie Address: [address]          [place], [place] [address] [place] Phone: [phone] Relation: Daughter Secondary Emergency Contact: Gerarda Fraction Address: [address]          [place], [place] [address] [place] Phone: [phone] Mobile Phone: [phone] Relation: Spouse  [place], HCPOA, MOST     Chief Complaint  Patient presents with  . Hospitalization Follow-up    Readmit to SNF following hospitalization; New diagnosis colon cancer, acute MI, CHF    HPI:  Hospitalized 04/13/2014-04/26/2014. Hyponatremia (Need Water restriction 800 CC). Na 122. A-fib(rate controlled) PNA(complete 5 day course of Doxy) NSTEMI (non-ST elevated myocardial infarction) CHF(may resume Furosemide), incidental finding of colon cancer(follow up with DR Junious Silk, needs PET Scan.)  Atrial fibrillation with HR in 150's, initial troponin elevated at 1.76.    The patient was placed on a Cardizem drip. Cardiology was consulted. The patient was noted to have NSTEMI with elevated troponin. A nuclear medicine stress test 6/5 showed a moderate sized, partially reversible inferolateral perfusion defect, consistent with ischemia, EF 54%.   Patient also developed anemia during the hospitalization. Cardiology recommended an anemia workup prior to cardiac cath. EGD was normal. CT of the abdomen and pelvis revealed a cecal mass, and the patient underwent coloscopy on 6/8 which showed a large exophytic bleeding mass encompassing the ileocecal valve. General surgery was consulted. Patient was diagnosed with colon cancer.   She also developed worsening of her hyponatremia. Her sodium level has improved  with water restriction. Her lasix was on hold due to hypotension.   Hospitalized 11/15/13-11/20/13 for PNA.     PAF (paroxysmal atrial fibrillation)     Atrial fibrillation with RVR: Paroxysmal  - Heart rate now again controlled, currently in normal sinus rhythm, Cardizem drip being rapidly weaned to off  - Not on any anticoagulation at this time due to anemia, newly diagnosed colon cancer - CHADSVASC is 5 so would like to initiate anticoag as soon as safely feasible  - Echo done outpatient EF 65%. LV ef normal, no wma, no valvular abn  -TSH normal, d-dimer was elevated, CT angiography chest negative for pulmonary embolism  -Had episode of A fib RVR 6-12, Cardizem was increased. Become hypotensive. Started on amiodarone 6-13.    Other dysphagia     Stable. Dysphagia III diet.    NSTEMI (non-ST elevated myocardial infarction)     -no chest pain, Troponins trending down.  -Stress Myoview test showed moderate size and intensity, partially reversible inferolateral perfusion defect (SDS 4), consistent with ischemia - Extent 19%. LVEF 54%, normal wall motion  -Cardiac cath revealed no signif coronary lesion    Hypothyroidism (Chronic)     - TSH 1.69, continue Synthroid    Hyponatremia     Might explain weakness. Treated initially with IV fluids, no significantly improved. Nephrologist consulted who recommend water restrictions. Sodium increase to 126 from 117. Continue with water restriction. She received 2 doses of sodium bicarb. Needs repeat B-met.    GERD (gastroesophageal reflux disease)     Stable, continue Omeprazole 52m daily. MBSS unremarkable.    COPD GOLD II (Chronic)     Bronchitis, chronic//PNA  -  Recently treated for pneumonia, continue Xopenex nebs, Mucinex  - has completed a course of Ceftin  - sputum culture normal oropharyngeal. Schedule guaifenesin. Incentive spirometry.  - Doxy for 5 more days.    Constipation     Stable, takes Senna I qhs.    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 Biopsy positive for adenocarcinoma  -Oncology consulted. Plan to have PET Scan fist prior to any surgical intervention. GI General surgery Oncology    Chronic diastolic congestive heart failure     -CHF likely precipitated due to rapid A. Fib - appears well compensated at this time  -will hold lasix due to hypotension. Might need to be resume.    Anxiety     F/u Psychiatrist, currently on Zoloft 62m and Clonazepam 0.262mq4hr prn, Lamictal 20062maily, Lorazepam 0.5mg19mh prn. off Abilify andTrazodone. Stabl    Anemia     Continue Fe     Past Medical History  Diagnosis Date  . Hypertension   . Coronary artery disease   . Heart murmur   . Hypothyroidism 05-02-12    tx. Synthroid  . Headache(784.0) 05-02-12    past hx. migrianes-many yrs ago  . Arthritis 05-02-12    osteoarthritis-rt. hip  . Pneumonia 05-02-12    mild case  . 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 Unspecified venous (peripheral) insufficiency 2013  . Senile osteoporosis 2013  . Unspecified closed fracture of pelvis 10/13/2012  . Cellulitis and abscess of leg, except foot 2013  . Altered mental status 2013  . Chronic pain syndrome 2013  . Urinary tract infection, site not specified   . Hyposmolality and/or hyponatremia 05/2012  . Congestive heart failure, unspecified 05/2012  . Pneumonia, organism unspecified 05/2012  . Pressure ulcer, buttock(707.05) 05/2012  . Hypoxemia 05/2012  . Reflux esophagitis 3013  . Unspecified constipation 2013  . Acute posthemorrhagic anemia 05/2012  . Coronary atherosclerosis of native coronary artery 05/2012  . Pain in joint, pelvic region and thigh 04/2012  . Dysphagia, unspecified(787.20) 03/2012  .  Benign neoplasm of adrenal gland 10/2011  . Alzheimer's disease 06/2011  . Other seborrheic keratosis 2012  . Hypertensive renal disease, benign 2012  . Pain in joint, lower leg 2012  . Memory loss 09/2010  . Spinal stenosis, lumbar region, without neurogenic claudication 2011  . Unspecified vitamin D deficiency 2010  . Unspecified disorder of kidney and ureter 2010  . Edema 2010  . Other malaise and fatigue 2009  . Congenital factor VIII disorder 2009  . Senile dementia with delusional features 2008  . Generalized hyperhidrosis 2007  . Irritable bowel syndrome 2007  . Unspecified chronic bronchitis 2006  . Depression 06/2005     depression only,tx. oral meds  . Shortness of breath 2005  . Anxiety 2004  . Unspecified late effects of cerebrovascular disease 2004  . Migraine with aura, without mention of intractable migraine without mention of status migrainosus 2003  . Tinnitus 2003  . Other symptoms involving cardiovascular system 2003  . Hyperlipidemia 2002  . Colonic cancer 04/20/2014  . NSTEMI (non-ST elevated myocardial infarction) 04/13/2014  . Osteoarthritis 06/28/2004  . Other dysphagia 02/04/2014  .  PAF (paroxysmal atrial fibrillation) 11/17/2013    Past Surgical History  Procedure Laterality Date  . Tonsillectomy    . Tubal ligation  05-02-12  .  tummy tuck  05-02-12    many yrs ago  . Cataract extraction, bilateral  05-02-12    bilateral  . Retinal detachment surgery  05-02-12    left  . Total hip arthroplasty  05/09/2012    Procedure: TOTAL HIP ARTHROPLASTY ANTERIOR APPROACH;  Surgeon: Mcarthur Rossetti, MD;  Location: [facility];  Service: Orthopedics;  Laterality: Right;  . Dental surgery  08/11/13    3 teeth surgical removed by Dr. Lewanda Rife  . Esophagogastroduodenoscopy N/A 04/16/2014    Procedure: ESOPHAGOGASTRODUODENOSCOPY (EGD);  Surgeon: Lafayette Dragon, MD;  Location: [facility];  Service: Endoscopy;  Laterality: N/A;  . Colonoscopy N/A 04/19/2014    Procedure:  COLONOSCOPY;  Surgeon: [name], MD;  Location: [facility];  Service: Endoscopy;  Laterality: N/A;    History   Social History  . Marital Status: Married    Spouse Name: N/A    Number of Children: N/A  . Years of Education: N/A   Occupational History  . retired    Social History Main Topics  . Smoking status: Former Smoker -- 1.00 packs/day for 62 years    Types: Cigarettes    Quit date: 10/10/2008  . Smokeless tobacco: Never Used  . Alcohol Use: No  . Drug Use: No  . Sexual Activity: No   Other Topics Concern  . Not on file   Social History Narrative  . No narrative on file     reports that she quit smoking about 5 years ago. Her smoking use included Cigarettes. She has a 62 pack-year smoking history. She has never used smokeless tobacco. She reports that she does not drink alcohol or use illicit drugs.  Immunization History  Administered Date(s) Administered  . Influenza Whole 08/12/2012, 08/26/2013  . Pneumococcal Polysaccharide-23 11/13/1999  . Td 11/12/2006    Allergies  Allergen Reactions  . Levaquin [Levofloxacin In D5w] Hives  . Penicillins Rash    Medications: Patient's Medications  New Prescriptions   No medications on file  Previous Medications   ACETAMINOPHEN (TYLENOL) 325 MG TABLET    Take 325 mg by mouth daily as needed for mild pain. One daily as needed   ALBUTEROL (PROVENTIL HFA;VENTOLIN HFA) 108 (90 BASE) MCG/ACT INHALER    Inhale 2 puffs into the lungs every 6 (six) hours as needed for wheezing or shortness of breath.   AMIODARONE (PACERONE) 200 MG TABLET    Take 1 tablet (200 mg total) by mouth daily.   ASCORBIC ACID (VITAMIN C) 1000 MG TABLET    Take 1,000 mg by mouth daily with breakfast.   ATORVASTATIN (LIPITOR) 10 MG TABLET    Take 1 tablet (10 mg total) by mouth daily at 6 PM.   BENZONATATE (TESSALON) 100 MG CAPSULE    Take 1 capsule (100 mg total) by mouth 3 (three) times daily as needed for cough.   BRIMONIDINE-TIMOLOL  (COMBIGAN) 0.2-0.5 % OPHTHALMIC SOLUTION    Place 1 drop into both eyes every 12 (twelve) hours.    CALCITONIN, SALMON, (MIACALCIN/FORTICAL) 200 UNIT/ACT NASAL SPRAY    Place 1 spray into alternate nostrils daily. Use one spray in one nostril once daily. Alternate nostrils daily   CALCIUM-VITAMIN D (OSCAL WITH D) 500-200 MG-UNIT PER TABLET    Take 1 tablet by mouth 3 (three) times daily. Take one tablet three times  daily for calcium supplementation   CLONAZEPAM (KLONOPIN) 0.25 MG DISINTEGRATING TABLET    Take 1 tablet (0.25 mg total) by mouth every 4 (four) hours as needed (breakthrough anxiety).   CLONAZEPAM (KLONOPIN) 0.5 MG TABLET    Take 1 tablet (0.5 mg total) by mouth at bedtime.   DILTIAZEM (CARDIZEM CD) 180 MG 24 HR CAPSULE    Take 1 capsule (180 mg total) by mouth daily.   DOXYCYCLINE (VIBRA-TABS) 100 MG TABLET    Take 1 tablet (100 mg total) by mouth 2 (two) times daily.   FEEDING SUPPLEMENT, RESOURCE BREEZE, (RESOURCE BREEZE) LIQD    Take 1 Container by mouth 3 (three) times daily between meals.   FERROUS SULFATE 325 (65 FE) MG TABLET    Take 325 mg by mouth 2 (two) times daily with a meal.   GUAIFENESIN 200 MG TABLET    Take 2 tablets (400 mg total) by mouth 2 (two) times daily.   IPRATROPIUM-ALBUTEROL (DUONEB) 0.5-2.5 (3) MG/3ML SOLN    Take 3 mLs by nebulization every 6 (six) hours as needed (cough/wheeze.). AND every 6 hours for 5 days starting 04/09/14.  (Last dose 04/14/14 at 0000.)   LAMOTRIGINE (LAMICTAL) 200 MG TABLET    Take 200 mg by mouth daily.   LEVALBUTEROL (XOPENEX) 0.63 MG/3ML NEBULIZER SOLUTION    Take 3 mLs (0.63 mg total) by nebulization every 6 (six) hours as needed for wheezing or shortness of breath.   LEVOTHYROXINE (SYNTHROID, LEVOTHROID) 88 MCG TABLET    Take 88 mcg by mouth daily before breakfast.   METOPROLOL SUCCINATE (TOPROL-XL) 50 MG 24 HR TABLET    Take 1 tablet (50 mg total) by mouth daily. Take with or immediately following a meal.   MULTIVITAMIN-LUTEIN  (OCUVITE-LUTEIN) CAPS    Take 1 capsule by mouth daily.   NITROGLYCERIN (NITROSTAT) 0.4 MG SL TABLET    Place 0.4 mg under the tongue every 5 (five) minutes as needed for chest pain.   OMEPRAZOLE (PRILOSEC) 20 MG CAPSULE    Take 20 mg by mouth 2 (two) times daily before a meal.   PILOCARPINE (SALAGEN) 5 MG TABLET    Take 5 mg by mouth 3 (three) times daily.   SACCHAROMYCES BOULARDII (FLORASTOR) 250 MG CAPSULE    Take 250 mg by mouth 2 (two) times daily. For 10 days.   SENNA (SENOKOT) 8.6 MG TABS    Take 2 tablets by mouth at bedtime.   SERTRALINE (ZOLOFT) 25 MG TABLET    Take 1 tablet (25 mg total) by mouth daily.  Modified Medications   No medications on file  Discontinued Medications   No medications on file     Review of Systems  Constitutional: Negative for fever, chills, appetite change and unexpected weight change.  HENT: Positive for hearing loss.   Eyes: Negative.   Respiratory: Positive for cough and shortness of breath. Negative for choking, chest tightness and wheezing.   Cardiovascular: Positive for leg swelling. Negative for chest pain and palpitations.       NSTEMI 03/12/69 Chronic diastolic CHF  Gastrointestinal:       Colon cancer diagnosedJune 2015  Endocrine: Negative.   Genitourinary: Positive for frequency. Negative for dysuria, urgency, flank pain, difficulty urinating and pelvic pain.  Musculoskeletal: Positive for arthralgias, back pain, gait problem and myalgias.       Continues to have significant right shoulder discomfort. It has improved since she was last seen by me.  Skin:       Multiple  senile ecchymoses.  Neurological: Negative for dizziness, tremors, seizures, facial asymmetry, speech difficulty, weakness and numbness.       Progressive memory loss is present.  Hematological: Negative.   Psychiatric/Behavioral: Positive for confusion and sleep disturbance. The patient is nervous/anxious.        History of bipolar disorder, with depression being the  dominant affect usually.  She is confused because of her progressing dementia.     Filed Vitals:   04/29/14 1027  BP: 160/80  Pulse: 80  Temp: 98.6 F (37 C)  Resp: 24  Height: _0  (1.626 m)  Weight: 154 lb (69.854 kg)  SpO2: 95%   Body mass index is 26.42 kg/(m^2).  Physical Exam  Constitutional:  Elderly. Frail. C/o fatigue. Ambulates with walker.   HENT:  Mouth/Throat: Mucous membranes are dry. No oral lesions. No uvula swelling. No oropharyngeal exudate, posterior oropharyngeal edema, posterior oropharyngeal erythema or tonsillar abscesses.   External auditory canals normal. Partial hearing loss bilaterally.  Eyes: EOM are normal. Pupils are equal, round, and reactive to light.  Wears corrective lenses.  Neck: Neck supple. No JVD present. No tracheal deviation present. No thyromegaly present.  Cardiovascular: Normal rate, regular rhythm and intact distal pulses.  Exam reveals no gallop and no friction rub.   Murmur heard.  Systolic murmur is present with a grade of 3/6  Pulmonary/Chest: Effort normal. No respiratory distress. She has no wheezes. She has rales. She exhibits no tenderness.  Diminished breath sounds in the right lung  Abdominal: Soft. Bowel sounds are normal. She exhibits no distension and no mass. There is no tenderness.  Musculoskeletal: She exhibits edema. She exhibits no tenderness.  Limited range of motion of the right shoulder. Trace edema BLE  Lymphadenopathy:    She has no cervical adenopathy.  Neurological:  Conversational, but there is a significant memory deficit.  Skin:  Multiple senile ecchymoses, otherwise normal  Psychiatric: She has a normal mood and affect. Her behavior is normal.     Labs reviewed: Admission on 04/13/2014, Discharged on 04/26/2014  No results displayed because visit has over 200 results.    Lab on 04/15/2014  Component Date Value Ref Range Status  . BUN 04/08/2014 15  4 - 21 mg/dL Final  . Creatinine 04/08/2014  1.1  0.5 - 1.1 mg/dL Final  . Potassium 04/08/2014 4.6  3.4 - 5.3 mmol/L Final  . Sodium 04/08/2014 124* 137 - 147 mmol/L Final  . Glucose 04/13/2014 95   Final  . BUN 04/13/2014 12  4 - 21 mg/dL Final  . Creatinine 04/13/2014 1.0  0.5 - 1.1 mg/dL Final  . Potassium 04/13/2014 4.7  3.4 - 5.3 mmol/L Final  . Sodium 04/13/2014 117* 137 - 147 mmol/L Final  Lab on 04/07/2014  Component Date Value Ref Range Status  . Hemoglobin 04/07/2014 9.1* 12.0 - 16.0 g/dL Final  . HCT 04/07/2014 28* 36 - 46 % Final  . Platelets 04/07/2014 476* 150 - 399 K/L Final  . WBC 04/07/2014 11.6   Final  Nursing Home on 04/01/2014  Component Date Value Ref Range Status  . Hemoglobin 02/02/2014 10.1* 12.0 - 16.0 g/dL Final  . HCT 02/02/2014 31* 36 - 46 % Final  . Platelets 02/02/2014 583* 150 - 399 K/L Final  . WBC 02/02/2014 6.2   Final  . Glucose 02/02/2014 88   Final  . BUN 02/02/2014 14  4 - 21 mg/dL Final  . Creatinine 02/02/2014 1.0  0.5 - 1.1 mg/dL Final  .  Potassium 02/02/2014 5.2  3.4 - 5.3 mmol/L Final  . Sodium 02/02/2014 134* 137 - 147 mmol/L Final  . Alkaline Phosphatase 02/02/2014 69  25 - 125 U/L Final  . ALT 02/02/2014 9  7 - 35 U/L Final  . AST 02/02/2014 15  13 - 35 U/L Final  . Bilirubin, Total 02/02/2014 0.2   Final  . TSH 02/02/2014 0.72  0.41 - 5.90 uIU/mL Final  Nursing Home on 02/04/2014  Component Date Value Ref Range Status  . Hemoglobin 01/19/2014 10.8* 12.0 - 16.0 g/dL Final  . HCT 01/19/2014 34* 36 - 46 % Final  . Platelets 01/19/2014 517* 150 - 399 K/L Final  . WBC 01/19/2014 5.8   Final  . Glucose 01/19/2014 101   Final  . BUN 01/19/2014 15  4 - 21 mg/dL Final  . Creatinine 01/19/2014 1.0  0.5 - 1.1 mg/dL Final  . Potassium 01/19/2014 4.6  3.4 - 5.3 mmol/L Final  . Sodium 01/19/2014 136* 137 - 147 mmol/L Final  . Alkaline Phosphatase 01/19/2014 67  25 - 125 U/L Final  . ALT 01/19/2014 11  7 - 35 U/L Final  . AST 01/19/2014 18  13 - 35 U/L Final  . Bilirubin, Total  01/19/2014 0.3   Final  Office Visit on 01/29/2014  Component Date Value Ref Range Status  . Rapid Strep A Screen 01/29/2014 Negative  Negative Final  . Organism ID, Bacteria 01/29/2014 Normal Upper Respiratory Flora   Final  . Organism ID, Bacteria 01/29/2014 No Beta Hemolytic Streptococci Isolated   Final      Assessment/Plan   1. Shortness of breath Acutely worse since admission. Hx COPD and CHF. Recent PNA. Remains on doxycycline. Plan: CXR, CBC, CMP, BNP  2. Fatigue Exhausted from recent illness.MI,PNA, anemia, colonoscopy, etc.  3. Edema Resumed furosemide  4. Chronic kidney disease, stage III (moderate) Follow lab, especially since furosemide is resumed  5. Anemia Recheck lab  6. Debility PT and OT as tolerated  7. Hyponatremia Recheck lab  8. Other dysphagia On dysphagia III diet  9. Colonic cancer Follow up with Dr. Benay Spice  10. Chronic diastolic congestive heart failure I suspect current dyspnea is related to fluid retention and will improve with diuresis

## 2014-05-03 ENCOUNTER — Encounter: Payer: Self-pay | Admitting: Nurse Practitioner

## 2014-05-03 ENCOUNTER — Ambulatory Visit (HOSPITAL_COMMUNITY)
Admit: 2014-05-03 | Discharge: 2014-05-03 | Disposition: A | Discharge status: Home / Self Care | Payer: Medicare Other | Attending: Nurse Practitioner | Admitting: Nurse Practitioner

## 2014-05-03 ENCOUNTER — Non-Acute Institutional Stay (SKILLED_NURSING_FACILITY): Payer: Medicare Other | Admitting: Nurse Practitioner

## 2014-05-03 DIAGNOSIS — K5900 Constipation, unspecified: Secondary | ICD-10-CM

## 2014-05-03 DIAGNOSIS — C7800 Secondary malignant neoplasm of unspecified lung: Secondary | ICD-10-CM | POA: Insufficient documentation

## 2014-05-03 DIAGNOSIS — E039 Hypothyroidism, unspecified: Secondary | ICD-10-CM

## 2014-05-03 DIAGNOSIS — C189 Malignant neoplasm of colon, unspecified: Secondary | ICD-10-CM

## 2014-05-03 DIAGNOSIS — D509 Iron deficiency anemia, unspecified: Secondary | ICD-10-CM

## 2014-05-03 DIAGNOSIS — I5032 Chronic diastolic (congestive) heart failure: Secondary | ICD-10-CM

## 2014-05-03 DIAGNOSIS — K219 Gastro-esophageal reflux disease without esophagitis: Secondary | ICD-10-CM

## 2014-05-03 DIAGNOSIS — C180 Malignant neoplasm of cecum: Secondary | ICD-10-CM | POA: Insufficient documentation

## 2014-05-03 DIAGNOSIS — E871 Hypo-osmolality and hyponatremia: Secondary | ICD-10-CM

## 2014-05-03 DIAGNOSIS — I4891 Unspecified atrial fibrillation: Secondary | ICD-10-CM

## 2014-05-03 DIAGNOSIS — I509 Heart failure, unspecified: Secondary | ICD-10-CM

## 2014-05-03 DIAGNOSIS — F3160 Bipolar disorder, current episode mixed, unspecified: Secondary | ICD-10-CM

## 2014-05-03 DIAGNOSIS — C787 Secondary malignant neoplasm of liver and intrahepatic bile duct: Secondary | ICD-10-CM | POA: Insufficient documentation

## 2014-05-03 LAB — GLUCOSE, CAPILLARY: Glucose-Capillary: 126 mg/dL — ABNORMAL HIGH (ref 70–99)

## 2014-05-03 MED ORDER — FLUDEOXYGLUCOSE F - 18 (FDG) INJECTION
9.4000 | Freq: Once | INTRAVENOUS | Status: AC | PRN
  Administered 2014-05-03: 9.4 via INTRAVENOUS

## 2014-05-03 NOTE — Assessment & Plan Note (Signed)
Stable, continue Omeprazole 20mg  daily. MBSS unremarkable.

## 2014-05-03 NOTE — Assessment & Plan Note (Signed)
Atrial fibrillation with RVR: Paroxysmal  - Heart rate now again controlled, currently in normal sinus rhythm, Cardizem drip being rapidly weaned to off  - Not on any anticoagulation at this time due to anemia, newly diagnosed colon cancer - CHADSVASC is 5 so would like to initiate anticoag as soon as safely feasible  - Echo done outpatient EF 65%. LV ef normal, no wma, no valvular abn  -TSH normal, d-dimer was elevated, CT angiography chest negative for pulmonary embolism  -Had episode of A fib RVR 6-12, Cardizem was increased. Become hypotensive. Started on amiodarone 6-13.

## 2014-05-03 NOTE — Assessment & Plan Note (Signed)
TSH 1.69, continue Synthroid 3mcg daily.

## 2014-05-03 NOTE — Assessment & Plan Note (Signed)
Desires no fluid restriction. Na 125 04/29/14.

## 2014-05-03 NOTE — Assessment & Plan Note (Signed)
Stable, continue Fe 

## 2014-05-03 NOTE — Assessment & Plan Note (Signed)
Stable, takes Senna I qhs.

## 2014-05-03 NOTE — Assessment & Plan Note (Addendum)
PET scan today. Hospice referral. Dc Atorvastatin, Vit C, Vit D, Miacalcin, MVI-no longer beneficial.

## 2014-05-03 NOTE — Progress Notes (Signed)
Patient ID: Jenny [name], female   DOB: [birth year], 80 y.o.   MRN: [id number]   Code Status: DNR,  MOST, no hospitalization, no fluid restriction.   Allergies  Allergen Reactions  . Levaquin [Levofloxacin In D5w] Hives  . Penicillins Rash    Chief Complaint  Patient presents with  . Medical Management of Chronic Issues  . Acute Visit    anxiety    HPI: Patient is a 80 y.o. female seen in the AL at [facility] today for evaluation of anxiety, CHF, hyponatremia, anemia,  and chronic medical conditions.     Hospitalized 04/13/2014-04/26/2014 for hyponatremia (Need Water restriction 800 CC).  A-fib(rate controlled), PNA(complete 5 day course of Doxy), NSTEMI (non-ST elevated myocardial infarction)     CHF(may resume Furosemide), incidental finding of colon cancer(follow up with DR Junious Silk, needs PET Scan.)   The Pt presented to ED with progressive fatigue, low Na, and SOB. She was  treated for PNA recently and is still on ABX. The patient was found to have atrial fibrillation with HR in 150's, initial troponin elevated at 1.76. Na 122.    The patient was placed on a Cardizem drip. Cardiology was consulted. The patient was noted to have NSTEMI with elevated troponin. A nuclear medicine stress test 6/5 showed a moderate sized, partially reversible inferolateral perfusion defect, consistent with ischemia, EF 54%.   Patient also developed anemia during the hospitalization. Cardiology recommended an anemia workup prior to cardiac cath. EGD was normal. CT of the abdomen and pelvis revealed a cecal mass, and the patient underwent coloscopy on 6/8 which showed a large exophytic bleeding mass encompassing the ileocecal valve. General surgery was consulted. Patient was diagnosed with colon cancer.   She also developed worsening of her hyponatremia. Her sodium level has improved with water restriction. Her lasix was on hold due to hypotension.    Hospitalized 11/15/13-11/20/13 for PNA.    Problem  List Items Addressed This Visit   Hypothyroidism (Chronic)     TSH 1.69, continue Synthroid 50mcg daily.       Hyponatremia     Desires no fluid restriction. Na 125 04/29/14.     GERD (gastroesophageal reflux disease)     Stable, continue Omeprazole 20mg  daily. MBSS unremarkable.       Constipation     Stable, takes Senna I qhs.       Colonic cancer     PET scan today. Hospice referral. Dc Atorvastatin, Vit C, Vit D, Miacalcin, MVI-no longer beneficial.     Chronic diastolic congestive heart failure     DOE. No O2 desaturation RA. BNP 672.3 04/29/14. Continue Furosemide 40mg  daily.     Relevant Medications      diltiazem (CARDIZEM CD) 180 MG 24 hr capsule   Bipolar I disorder, most recent episode mixed - Primary     F/u Psychiatrist, currently on Zoloft 25mg  and Clonazepam 0.25mg  q4hr prn, Lamictal 200mg  daily, Lorazepam 0.5mg  q4h prn. off  Abilify andTrazodone. Escalated worries and anxious mood. Personal sitter and increased Clonazepam 0.5mg  tid are needed for the patient's comfort care.    Atrial fibrillation with RVR     Atrial fibrillation with RVR: Paroxysmal  - Heart rate now again controlled, currently in normal sinus rhythm, Cardizem drip being rapidly weaned to off  - Not on any anticoagulation at this time due to anemia, newly diagnosed colon cancer - CHADSVASC is 5 so would like to initiate anticoag as soon as safely feasible  - Echo done  outpatient EF 65%. LV ef normal, no wma, no valvular abn  -TSH normal, d-dimer was elevated, CT angiography chest negative for pulmonary embolism  -Had episode of A fib RVR 6-12, Cardizem was increased. Become hypotensive. Started on amiodarone 6-13.       Relevant Medications      diltiazem (CARDIZEM CD) 180 MG 24 hr capsule   Anemia     Stable, continue Fe       Review of Systems:  Review of Systems  Constitutional: Positive for malaise/fatigue. Negative for fever, chills, weight loss and diaphoresis.       Generalized  weakness  HENT: Positive for hearing loss. Negative for congestion, ear pain and sore throat.   Eyes: Negative for pain, discharge and redness.  Respiratory: Negative for cough, hemoptysis, sputum production, shortness of breath and wheezing.   Cardiovascular: Positive for leg swelling. Negative for chest pain, palpitations, orthopnea, claudication and PND.       Trace BLE  Gastrointestinal: Negative for heartburn, nausea, vomiting, abdominal pain, diarrhea, constipation and blood in stool.       Difficulty swallowing.   Genitourinary: Positive for frequency. Negative for dysuria, urgency, hematuria and flank pain.  Musculoskeletal: Positive for joint pain. Negative for back pain, falls, myalgias and neck pain.  Skin: Negative for itching and rash.  Neurological: Positive for weakness. Negative for dizziness, tingling, tremors, sensory change, speech change, focal weakness, seizures, loss of consciousness and headaches.  Endo/Heme/Allergies: Negative for environmental allergies and polydipsia. Does not bruise/bleed easily.  Psychiatric/Behavioral: Positive for depression and memory loss. Negative for hallucinations. The patient is nervous/anxious. The patient does not have insomnia.      Past Medical History  Diagnosis Date  . Hypertension   . Coronary artery disease   . Heart murmur   . Hypothyroidism 05-02-12    tx. Synthroid  . Headache(784.0) 05-02-12    past hx. migrianes-many yrs ago  . Arthritis 05-02-12    osteoarthritis-rt. hip  . Pneumonia 05-02-12    mild case  . 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 Unspecified venous (peripheral) insufficiency 2013  . Senile osteoporosis 2013  . Unspecified closed fracture of pelvis 10/13/2012  . Cellulitis and abscess of leg, except foot 2013  . Altered mental status 2013  . Chronic pain syndrome 2013  . Urinary tract infection, site not specified   . Hyposmolality and/or hyponatremia 05/2012  . Congestive heart failure, unspecified 05/2012    . Pneumonia, organism unspecified 05/2012  . Pressure ulcer, buttock(707.05) 05/2012  . Hypoxemia 05/2012  . Reflux esophagitis 3013  . Unspecified constipation 2013  . Acute posthemorrhagic anemia 05/2012  . Coronary atherosclerosis of native coronary artery 05/2012  . Pain in joint, pelvic region and thigh 04/2012  . Dysphagia, unspecified(787.20) 03/2012  . Benign neoplasm of adrenal gland 10/2011  . Alzheimer's disease 06/2011  . Other seborrheic keratosis 2012  . Hypertensive renal disease, benign 2012  . Pain in joint, lower leg 2012  . Memory loss 09/2010  . Spinal stenosis, lumbar region, without neurogenic claudication 2011  . Unspecified vitamin D deficiency 2010  . Unspecified disorder of kidney and ureter 2010  . Edema 2010  . Other malaise and fatigue 2009  . Congenital factor VIII disorder 2009  . Senile dementia with delusional features 2008  . Generalized hyperhidrosis 2007  . Irritable bowel syndrome 2007  . Unspecified chronic bronchitis 2006  . Depression 06/2005     depression only,tx. oral meds  . Shortness of breath 2005  .  Anxiety 2004  . Unspecified late effects of cerebrovascular disease 2004  . Migraine with aura, without mention of intractable migraine without mention of status migrainosus 2003  . Tinnitus 2003  . Other symptoms involving cardiovascular system 2003  . Hyperlipidemia 2002  . Colonic cancer 04/20/2014  . NSTEMI (non-ST elevated myocardial infarction) 04/13/2014  . Osteoarthritis 06/28/2004  . Other dysphagia 02/04/2014  . PAF (paroxysmal atrial fibrillation) 11/17/2013   Past Surgical History  Procedure Laterality Date  . Tonsillectomy    . Tubal ligation  05-02-12  .  tummy tuck  05-02-12    many yrs ago  . Cataract extraction, bilateral  05-02-12    bilateral  . Retinal detachment surgery  05-02-12    left  . Total hip arthroplasty  05/09/2012    Procedure: TOTAL HIP ARTHROPLASTY ANTERIOR APPROACH;  Surgeon: Mcarthur Rossetti, MD;   Location: [facility];  Service: Orthopedics;  Laterality: Right;  . Dental surgery  08/11/13    3 teeth surgical removed by Dr. Lewanda Rife  . Esophagogastroduodenoscopy N/A 04/16/2014    Procedure: ESOPHAGOGASTRODUODENOSCOPY (EGD);  Surgeon: Lafayette Dragon, MD;  Location: [facility];  Service: Endoscopy;  Laterality: N/A;  . Colonoscopy N/A 04/19/2014    Procedure: COLONOSCOPY;  Surgeon: [name], MD;  Location: [facility];  Service: Endoscopy;  Laterality: N/A;   Social History:   reports that she quit smoking about 5 years ago. Her smoking use included Cigarettes. She has a 62 pack-year smoking history. She has never used smokeless tobacco. She reports that she does not drink alcohol or use illicit drugs.  Family History  Problem Relation Age of Onset  . Diabetes Father     Medications: Patient's Medications  New Prescriptions   No medications on file  Previous Medications   ACETAMINOPHEN (TYLENOL) 325 MG TABLET    Take 325 mg by mouth daily as needed for mild pain. One daily as needed   ALBUTEROL (PROVENTIL HFA;VENTOLIN HFA) 108 (90 BASE) MCG/ACT INHALER    Inhale 2 puffs into the lungs every 6 (six) hours as needed for wheezing or shortness of breath.   AMIODARONE (PACERONE) 200 MG TABLET    Take 1 tablet (200 mg total) by mouth daily.   BENZONATATE (TESSALON) 100 MG CAPSULE    Take 1 capsule (100 mg total) by mouth 3 (three) times daily as needed for cough.   BRIMONIDINE-TIMOLOL (COMBIGAN) 0.2-0.5 % OPHTHALMIC SOLUTION    Place 1 drop into both eyes every 12 (twelve) hours.    CLONAZEPAM (KLONOPIN) 0.25 MG DISINTEGRATING TABLET    Take 1 tablet (0.25 mg total) by mouth every 4 (four) hours as needed (breakthrough anxiety).   CLONAZEPAM (KLONOPIN) 0.5 MG TABLET    Take 1 tablet (0.5 mg total) by mouth at bedtime.   DOXYCYCLINE (VIBRA-TABS) 100 MG TABLET    Take 1 tablet (100 mg total) by mouth 2 (two) times daily.   FEEDING SUPPLEMENT, RESOURCE BREEZE, (RESOURCE BREEZE) LIQD    Take 1  Container by mouth 3 (three) times daily between meals.   FERROUS SULFATE 325 (65 FE) MG TABLET    Take 325 mg by mouth 2 (two) times daily with a meal.   FUROSEMIDE (LASIX) 40 MG TABLET    One daily to prevent fluid retention   GUAIFENESIN 200 MG TABLET    Take 2 tablets (400 mg total) by mouth 2 (two) times daily.   IPRATROPIUM-ALBUTEROL (DUONEB) 0.5-2.5 (3) MG/3ML SOLN    Take 3 mLs by nebulization  every 6 (six) hours as needed (cough/wheeze.). AND every 6 hours for 5 days starting 04/09/14.  (Last dose 04/14/14 at 0000.)   LAMOTRIGINE (LAMICTAL) 200 MG TABLET    Take 200 mg by mouth daily.   LEVALBUTEROL (XOPENEX) 0.63 MG/3ML NEBULIZER SOLUTION    Take 3 mLs (0.63 mg total) by nebulization every 6 (six) hours as needed for wheezing or shortness of breath.   LEVOTHYROXINE (SYNTHROID, LEVOTHROID) 88 MCG TABLET    Take 88 mcg by mouth daily before breakfast.   METOPROLOL SUCCINATE (TOPROL-XL) 50 MG 24 HR TABLET    Take 1 tablet (50 mg total) by mouth daily. Take with or immediately following a meal.   MULTIVITAMIN-LUTEIN (OCUVITE-LUTEIN) CAPS    Take 1 capsule by mouth daily.   NITROGLYCERIN (NITROSTAT) 0.4 MG SL TABLET    Place 0.4 mg under the tongue every 5 (five) minutes as needed for chest pain.   OMEPRAZOLE (PRILOSEC) 20 MG CAPSULE    Take 20 mg by mouth 2 (two) times daily before a meal.   PILOCARPINE (SALAGEN) 5 MG TABLET    Take 5 mg by mouth 3 (three) times daily.   SACCHAROMYCES BOULARDII (FLORASTOR) 250 MG CAPSULE    Take 250 mg by mouth 2 (two) times daily. For 10 days.   SENNA (SENOKOT) 8.6 MG TABS    Take 2 tablets by mouth at bedtime.   SERTRALINE (ZOLOFT) 25 MG TABLET    Take 1 tablet (25 mg total) by mouth daily.  Modified Medications   Modified Medication Previous Medication   DILTIAZEM (CARDIZEM CD) 180 MG 24 HR CAPSULE diltiazem (CARDIZEM CD) 180 MG 24 hr capsule      Take 180 mg by mouth 3 (three) times daily.    Take 1 capsule (180 mg total) by mouth daily.  Discontinued  Medications   ASCORBIC ACID (VITAMIN C) 1000 MG TABLET    Take 1,000 mg by mouth daily with breakfast.   ATORVASTATIN (LIPITOR) 10 MG TABLET    Take 1 tablet (10 mg total) by mouth daily at 6 PM.   CALCITONIN, SALMON, (MIACALCIN/FORTICAL) 200 UNIT/ACT NASAL SPRAY    Place 1 spray into alternate nostrils daily. Use one spray in one nostril once daily. Alternate nostrils daily   CALCIUM-VITAMIN D (OSCAL WITH D) 500-200 MG-UNIT PER TABLET    Take 1 tablet by mouth 3 (three) times daily. Take one tablet three times daily for calcium supplementation     Physical Exam: Physical Exam  Constitutional:  Elderly. Frail. C/o fatigue. Ambulates with walker.   HENT:  Mouth/Throat: Mucous membranes are dry. No oral lesions. No uvula swelling. No oropharyngeal exudate, posterior oropharyngeal edema, posterior oropharyngeal erythema or tonsillar abscesses.   External auditory canals normal. Partial hearing loss bilaterally.  Eyes: EOM are normal. Pupils are equal, round, and reactive to light.  Wears corrective lenses.  Neck: Neck supple. No JVD present. No tracheal deviation present. No thyromegaly present.  Cardiovascular: Normal rate and intact distal pulses.  Exam reveals no gallop and no friction rub.   Murmur heard.  Systolic murmur is present with a grade of 3/6  Pulmonary/Chest: Effort normal. No respiratory distress. She has no wheezes. She has rales. She exhibits no tenderness.  Abdominal: Soft. Bowel sounds are normal. She exhibits no distension and no mass. There is no tenderness.  Musculoskeletal: She exhibits edema. She exhibits no tenderness.  Limited range of motion of the right shoulder. Trace edema BLE  Lymphadenopathy:    She has no  cervical adenopathy.  Neurological:  Conversational, but there is a significant memory deficit.  Skin:  Multiple senile ecchymoses, otherwise normal  Psychiatric: She has a normal mood and affect. Her behavior is normal.    Filed Vitals:   05/03/14  1530  BP: 110/80  Pulse: 74  Temp: 98.6 F (37 C)  TempSrc: Tympanic  Resp: 18   Labs reviewed: Basic Metabolic Panel:  Recent Labs  11/19/13 0515 11/20/13 0534  01/19/14 02/02/14  04/13/14 1841  04/19/14 0350 04/21/14 0830  04/24/14 1951 04/25/14 0443 04/26/14 0405 04/29/14  NA 125* 125*  < > 136* 134*  < >  --   < > 136* 128*  < > 119* 121* 126* 125*  K 4.0 3.6*  < > 4.6 5.2  < >  --   < > 4.7 5.3  < > 4.0 3.9 3.8 5.0  CL 86* 88*  --   --   --   < >  --   < > 99 93*  < > 87* 87* 93*  --   CO2 22 23  --   --   --   < >  --   < > 21 20  < > 15* 16* 20  --   GLUCOSE 130* 109*  --   --   --   < >  --   < > 102* 131*  < > 216* 137* 114*  --   BUN 19 18  < > 15 14  < >  --   < > 18 12  < > 14 13 11 9   CREATININE 0.97 0.96  < > 1.0 1.0  < >  --   < > 1.17* 0.98  < > 0.84 0.89 0.88 0.9  CALCIUM 8.5 8.3*  --   --   --   < >  --   < > 9.2 9.1  < > 8.4 8.5 8.8  --   MG  --   --   --   --   --   --   --   --   --  1.7  --   --   --   --   --   PHOS  --  2.8  --   --   --   --   --   --   --   --   --   --   --   --   --   TSH  --   --   --   --  0.72  --  1.690  --   --   --   --   --   --   --   --   < > = values in this interval not displayed. Liver Function Tests:  Recent Labs  11/15/13 0500 11/16/13 0502 11/20/13 0534  02/02/14 04/13/14 1519 04/29/14  AST 15 18  --   < > 15 21 35  ALT 13 13  --   < > 9 15 31   ALKPHOS 83 74  --   < > 69 92 95  BILITOT <0.2* <0.2*  --   --   --  <0.2*  --   PROT 6.7 6.0  --   --   --  6.4  --   ALBUMIN 2.7* 2.2* 2.3*  --   --  3.1*  --   < > = values in this interval not displayed. CBC:  Recent Labs  06/15/13 1531 11/15/13 0500  04/13/14 1519  04/23/14 1325 04/24/14 0413 04/25/14 0443 04/29/14  WBC 9.9 17.3*  < > 7.7  < > 16.5* 13.9* 9.1 9.2  NEUTROABS 5.6 13.0*  --  4.8  --   --   --   --   --   HGB 11.8* 8.0*  < > 9.2*  < > 10.7* 10.8* 10.3* 10.1*  HCT 35.7* 24.1*  < > 26.5*  < > 31.8* 32.4* 30.4* 31*  MCV 83.1 70.5*  < > 81.8   < > 82.6 83.1 82.8  --   PLT 469.0* 552*  < > 589*  < > 467* 536* 487* 589*  < > = values in this interval not displayed.  Past Procedures:  11/15/2013 CLINICAL DATA: Cough. Evaluate for pneumonia versus edema. EXAM: CHEST - IMPRESSION: Persistent left lower lobe airspace disease suspicious for pneumonia. No significant pleural effusion.   11/15/2013 CLINICAL DATA: Chest pain EXAM: PORTABLE CHEST - IMPRESSION: Left lower lobe infiltrate, worrisome for pneumonia.   04/03/14 CXR improving bilateral pneumonitis.    04/13/14 CXR IMPRESSION: 1. Partial improvement in the left lower lung airspace disease. 2. Stable cardiomegaly    04/14/14 CXR IMPRESSION: Unchanged bibasilar lung opacities, morphology favoring atelectasis  04/14/14 CT chest angio chest PT IMPRESSION: 1. Negative for pulmonary embolism. 2. Small left pleural effusion. 3. Three left lung nodules, measuring up to 13 mm. Bronchogenic carcinoma is a diagnostic possibility, recommend follow-up chest CT in 3 months. 4. Emphysema   04/17/14 CT abd and pelvis: IMPRESSION: 4.6 cm eccentric cecal mass, worrisome for primary colonic neoplasm. Colonoscopy is suggested for further evaluation. 7 x 11 mm left lower lobe nodule, metastasis not excluded. 3.5 cm left adrenal mass, unchanged since 2012, likely reflecting a benign adrenal adenoma. Small left pleural effusion, possibly partially loculated.   04/24/14 DG abd: IMPRESSION: Nonobstructive bowel gas pattern. Stool volume is within normal limits.  04/22/14 CXR IMPRESSION: Bibasilar atelectasis unchanged. Increase in small left effusion     Assessment/Plan Anemia Stable, continue Fe  Bipolar I disorder, most recent episode mixed F/u Psychiatrist, currently on Zoloft 25mg  and Clonazepam 0.25mg  q4hr prn, Lamictal 200mg  daily, Lorazepam 0.5mg  q4h prn. off  Abilify andTrazodone. Escalated worries and anxious mood. Personal sitter and increased Clonazepam 0.5mg  tid are needed for the patient's comfort  care.  Colonic cancer PET scan today. Hospice referral. Dc Atorvastatin, Vit C, Vit D, Miacalcin, MVI-no longer beneficial.   Hyponatremia Desires no fluid restriction. Na 125 04/29/14.   Chronic diastolic congestive heart failure DOE. No O2 desaturation RA. BNP 672.3 04/29/14. Continue Furosemide 40mg  daily.   Atrial fibrillation with RVR Atrial fibrillation with RVR: Paroxysmal  - Heart rate now again controlled, currently in normal sinus rhythm, Cardizem drip being rapidly weaned to off  - Not on any anticoagulation at this time due to anemia, newly diagnosed colon cancer - CHADSVASC is 5 so would like to initiate anticoag as soon as safely feasible  - Echo done outpatient EF 65%. LV ef normal, no wma, no valvular abn  -TSH normal, d-dimer was elevated, CT angiography chest negative for pulmonary embolism  -Had episode of A fib RVR 6-12, Cardizem was increased. Become hypotensive. Started on amiodarone 6-13.     GERD (gastroesophageal reflux disease) Stable, continue Omeprazole 20mg  daily. MBSS unremarkable.     Constipation Stable, takes Senna I qhs.     Hypothyroidism TSH 1.69, continue Synthroid 63mcg daily.       Family/ Staff Communication: observe the  patient  Goals of Care: SNF  Labs/tests ordered: none

## 2014-05-03 NOTE — Assessment & Plan Note (Signed)
F/u Psychiatrist, currently on Zoloft 25mg  and Clonazepam 0.25mg  q4hr prn, Lamictal 200mg  daily, Lorazepam 0.5mg  q4h prn. off  Abilify andTrazodone. Escalated worries and anxious mood. Personal sitter and increased Clonazepam 0.5mg  tid are needed for the patient's comfort care.

## 2014-05-03 NOTE — Assessment & Plan Note (Addendum)
DOE. No O2 desaturation RA. BNP 672.3 04/29/14. Continue Furosemide 40mg  daily.

## 2014-05-05 ENCOUNTER — Telehealth: Payer: Self-pay | Admitting: *Deleted

## 2014-05-05 NOTE — Telephone Encounter (Addendum)
Patient's daughter Jenny [name] and P.O.A. Called requesting patient be seen before 05-11-2014.    "She is declining and we need guidance to determine what to put here through.  Scan done on 05-03-2014 to determine where cancer is.  If it's through her body, we don't want to take her back to the hospital.  She is weak, barely eating or drinking, cannot bear weight, confused again, sodium level is off.  Declining rapidly and we're in a holding pattern trying to keep her comfortable.  Does she need surgery, Hospice, what.  To get her in we'll need to bring her by ambulance because it takes up to three people to get her up.  If Dr. Benay Spice could give some guidance and move the appointment sooner that would help.  Currently a resident at [facility]  and we don't want to send her back to the Hospital."  Will notify providers.  Expressed that patient needs to go to the Hospital if in any distress.  Lesleigh Noe states she understands this but needs guidance.  Will notify providers.  Approximately 12:50 pm verbal orders received and read back from Dr. 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  Due to symptoms and recent hospitalization, patient needs to be evaluated by provider at nursing facility or the ED.  Could do a work in tomorrow at 4:00 pm.  Called daughter, this information given.  States this is very helpful and will call if any new needs.

## 2014-05-06 NOTE — Telephone Encounter (Signed)
Called Jenny [name] this morning to inquire about [name].  Jenny [name] returned cal reporting "the staff at [facility] have assessed mom and feel she is in heart failure.  They' re treating her there and we did not go to the ER.  She is on  Regular diet, off fluid restriction and is doing better.  They feel she's a Hospice based on her heart.  We'll keep the appointment there on Tuesday and have arranged transpotation in her wheelchair by [facility]."  Dr. Benay Spice notified.

## 2014-05-07 ENCOUNTER — Ambulatory Visit: Payer: Medicare Other | Admitting: Cardiology

## 2014-05-10 ENCOUNTER — Non-Acute Institutional Stay (SKILLED_NURSING_FACILITY): Payer: Medicare Other | Admitting: Nurse Practitioner

## 2014-05-10 ENCOUNTER — Encounter: Payer: Self-pay | Admitting: Nurse Practitioner

## 2014-05-10 DIAGNOSIS — I480 Paroxysmal atrial fibrillation: Secondary | ICD-10-CM

## 2014-05-10 DIAGNOSIS — E871 Hypo-osmolality and hyponatremia: Secondary | ICD-10-CM

## 2014-05-10 DIAGNOSIS — J9600 Acute respiratory failure, unspecified whether with hypoxia or hypercapnia: Secondary | ICD-10-CM

## 2014-05-10 DIAGNOSIS — C189 Malignant neoplasm of colon, unspecified: Secondary | ICD-10-CM

## 2014-05-10 DIAGNOSIS — C7800 Secondary malignant neoplasm of unspecified lung: Secondary | ICD-10-CM

## 2014-05-10 DIAGNOSIS — I5032 Chronic diastolic (congestive) heart failure: Secondary | ICD-10-CM

## 2014-05-10 DIAGNOSIS — J9601 Acute respiratory failure with hypoxia: Secondary | ICD-10-CM | POA: Insufficient documentation

## 2014-05-10 DIAGNOSIS — I509 Heart failure, unspecified: Secondary | ICD-10-CM

## 2014-05-10 DIAGNOSIS — I4891 Unspecified atrial fibrillation: Secondary | ICD-10-CM

## 2014-05-10 NOTE — Progress Notes (Signed)
Patient ID: Jenny [name], female   DOB: 11/05/1927, 80 y.o.   MRN: [id number]   Code Status: DNR,  MOST, no hospitalization, no fluid restriction.   Allergies  Allergen Reactions  . Levaquin [Levofloxacin In D5w] Hives  . Penicillins Rash    Chief Complaint  Patient presents with  . Medical Management of Chronic Issues  . Acute Visit    respiratory distress, restlessness, AMS    HPI: Patient is a 80 y.o. female seen in the AL at [facility] today for evaluation of restlessness, respiratory distress, AMS.    Early this am: the patient became SOB, O2 sat dropped to 70s%, RR up to high 30s with labored breath. She is noted extremely anxious and restless. Lorazepam and Morphine with partial relief but not adequate for comfort purpose.     Hospitalized 04/13/2014-04/26/2014 for hyponatremia (Need Water restriction 800 CC).  A-fib(rate controlled), PNA(complete 5 day course of Doxy), NSTEMI (non-ST elevated myocardial infarction)     CHF(may resume Furosemide), incidental finding of colon cancer(follow up with DR Junious Silk, needs PET Scan.)   The Pt presented to ED with progressive fatigue, low Na, and SOB. She was  treated for PNA recently and is still on ABX. The patient was found to have atrial fibrillation with HR in 150's, initial troponin elevated at 1.76. Na 122.    The patient was placed on a Cardizem drip. Cardiology was consulted. The patient was noted to have NSTEMI with elevated troponin. A nuclear medicine stress test 6/5 showed a moderate sized, partially reversible inferolateral perfusion defect, consistent with ischemia, EF 54%.   Patient also developed anemia during the hospitalization. Cardiology recommended an anemia workup prior to cardiac cath. EGD was normal. CT of the abdomen and pelvis revealed a cecal mass, and the patient underwent coloscopy on 6/8 which showed a large exophytic bleeding mass encompassing the ileocecal valve. General surgery was consulted. Patient  was diagnosed with colon cancer.   She also developed worsening of her hyponatremia. Her sodium level has improved with water restriction. Her lasix was on hold due to hypotension.    Hospitalized 11/15/13-11/20/13 for PNA.    Problem List Items Addressed This Visit   Hyponatremia     Comfort feeding-no longer curative approach.     PAF (paroxysmal atrial fibrillation)     Presently rate controlled.     Chronic diastolic congestive heart failure     Chronic.     Colonic cancer     05/03/14 PET   IMPRESSION:  1. 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  2. Mildly hypermetabolic superior mediastinal and right  supraclavicular lymph nodes.  3. Hypermetabolic left adrenal mass. Given that the size and  appearance of the mass has not changed from 62/95/2841, a  hypermetabolic adenoma is favored. A collision tumor cannot be  definitively excluded, however.  4. Small pericardial effusion.  5. Small left pleural effusion, partially loculated.  6. Cholelithiasis.      Relevant Medications      morphine (ROXANOL) 20 MG/ML concentrated solution      LORazepam (ATIVAN) 2 MG tablet   Acute respiratory failure with hypoxia - Primary     O2 sat in high 70s%. Hx of CHF, A-fib, and  Hyponatremia. Newly identified metastatic disease lungs.  Comfort measures: O2 via [place]. Lorazepam and Morphine prn. Dc all other meds.    05/03/14 PET  IMPRESSION:  1. 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  2. Mildly  hypermetabolic superior mediastinal and right  supraclavicular lymph nodes.  3. Hypermetabolic left adrenal mass. Given that the size and  appearance of the mass has not changed from 66/04/3015, a  hypermetabolic adenoma is favored. A collision tumor cannot be  definitively excluded, however.  4. Small pericardial effusion.  5. Small left pleural effusion, partially loculated.  6. Cholelithiasis.      Cancer, metastatic to lung      05/03/14 PE  IMPRESSION:  1. 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  2. Mildly hypermetabolic superior mediastinal and right  supraclavicular lymph nodes.  3. Hypermetabolic left adrenal mass. Given that the size and  appearance of the mass has not changed from 11/20/3233, a  hypermetabolic adenoma is favored. A collision tumor cannot be  definitively excluded, however.  4. Small pericardial effusion.  5. Small left pleural effusion, partially loculated.  6. Cholelithiasis.      Relevant Medications      morphine (ROXANOL) 20 MG/ML concentrated solution      LORazepam (ATIVAN) 2 MG tablet      Review of Systems:  Review of Systems  Constitutional: Positive for malaise/fatigue. Negative for fever, chills, weight loss and diaphoresis.       Generalized weakness.  HENT: Positive for hearing loss. Negative for congestion, ear pain and sore throat.   Eyes: Negative for pain, discharge and redness.  Respiratory: Positive for shortness of breath. Negative for cough, hemoptysis, sputum production and wheezing.        RR 38-40  Cardiovascular: Negative for chest pain, palpitations, orthopnea, claudication, leg swelling and PND.  Gastrointestinal: Negative for heartburn, nausea, vomiting, abdominal pain, diarrhea, constipation and blood in stool.       Difficulty swallowing.   Genitourinary: Positive for frequency. Negative for dysuria, urgency, hematuria and flank pain.  Musculoskeletal: Positive for joint pain. Negative for back pain, falls, myalgias and neck pain.  Skin: Negative for itching and rash.  Neurological: Positive for speech change, loss of consciousness and weakness. Negative for dizziness, tingling, tremors, sensory change, focal weakness, seizures and headaches.  Endo/Heme/Allergies: Negative for environmental allergies and polydipsia. Does not bruise/bleed easily.  Psychiatric/Behavioral: Positive for memory loss. Negative for depression  and hallucinations. The patient is nervous/anxious. The patient does not have insomnia.      Past Medical History  Diagnosis Date  . Hypertension   . Coronary artery disease   . Heart murmur   . Hypothyroidism 05-02-12    tx. Synthroid  . Headache(784.0) 05-02-12    past hx. migrianes-many yrs ago  . Arthritis 05-02-12    osteoarthritis-rt. hip  . Pneumonia 05-02-12    mild case  . 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 Unspecified venous (peripheral) insufficiency 2013  . Senile osteoporosis 2013  . Unspecified closed fracture of pelvis 10/13/2012  . Cellulitis and abscess of leg, except foot 2013  . Altered mental status 2013  . Chronic pain syndrome 2013  . Urinary tract infection, site not specified   . Hyposmolality and/or hyponatremia 05/2012  . Congestive heart failure, unspecified 05/2012  . Pneumonia, organism unspecified 05/2012  . Pressure ulcer, buttock(707.05) 05/2012  . Hypoxemia 05/2012  . Reflux esophagitis 3013  . Unspecified constipation 2013  . Acute posthemorrhagic anemia 05/2012  . Coronary atherosclerosis of native coronary artery 05/2012  . Pain in joint, pelvic region and thigh 04/2012  . Dysphagia, unspecified(787.20) 03/2012  . Benign neoplasm of adrenal gland 10/2011  . Alzheimer's disease 06/2011  . Other seborrheic keratosis 2012  .  Hypertensive renal disease, benign 2012  . Pain in joint, lower leg 2012  . Memory loss 09/2010  . Spinal stenosis, lumbar region, without neurogenic claudication 2011  . Unspecified vitamin D deficiency 2010  . Unspecified disorder of kidney and ureter 2010  . Edema 2010  . Other malaise and fatigue 2009  . Congenital factor VIII disorder 2009  . Senile dementia with delusional features 2008  . Generalized hyperhidrosis 2007  . Irritable bowel syndrome 2007  . Unspecified chronic bronchitis 2006  . Depression 06/2005     depression only,tx. oral meds  . Shortness of breath 2005  . Anxiety 2004  . Unspecified late effects of  cerebrovascular disease 2004  . Migraine with aura, without mention of intractable migraine without mention of status migrainosus 2003  . Tinnitus 2003  . Other symptoms involving cardiovascular system 2003  . Hyperlipidemia 2002  . Colonic cancer 04/20/2014  . NSTEMI (non-ST elevated myocardial infarction) 04/13/2014  . Osteoarthritis 06/28/2004  . Other dysphagia 02/04/2014  . PAF (paroxysmal atrial fibrillation) 11/17/2013   Past Surgical History  Procedure Laterality Date  . Tonsillectomy    . Tubal ligation  05-02-12  .  tummy tuck  05-02-12    many yrs ago  . Cataract extraction, bilateral  05-02-12    bilateral  . Retinal detachment surgery  05-02-12    left  . Total hip arthroplasty  05/09/2012    Procedure: TOTAL HIP ARTHROPLASTY ANTERIOR APPROACH;  Surgeon: Mcarthur Rossetti, MD;  Location: [facility];  Service: Orthopedics;  Laterality: Right;  . Dental surgery  08/11/13    3 teeth surgical removed by Dr. Lewanda Rife  . Esophagogastroduodenoscopy N/A 04/16/2014    Procedure: ESOPHAGOGASTRODUODENOSCOPY (EGD);  Surgeon: Lafayette Dragon, MD;  Location: [facility];  Service: Endoscopy;  Laterality: N/A;  . Colonoscopy N/A 04/19/2014    Procedure: COLONOSCOPY;  Surgeon: [name], MD;  Location: [facility];  Service: Endoscopy;  Laterality: N/A;   Social History:   reports that she quit smoking about 5 years ago. Her smoking use included Cigarettes. She has a 62 pack-year smoking history. She has never used smokeless tobacco. She reports that she does not drink alcohol or use illicit drugs.  Family History  Problem Relation Age of Onset  . Diabetes Father     Medications: Patient's Medications  New Prescriptions   No medications on file  Previous Medications   LORAZEPAM (ATIVAN) 2 MG TABLET    Take 2 mg by mouth every 12 (twelve) hours as needed for anxiety.   MORPHINE (ROXANOL) 20 MG/ML CONCENTRATED SOLUTION    Take 10 mg by mouth every 12 (twelve) hours as needed for severe  pain.  Modified Medications   No medications on file  Discontinued Medications   ACETAMINOPHEN (TYLENOL) 325 MG TABLET    Take 325 mg by mouth daily as needed for mild pain. One daily as needed   ALBUTEROL (PROVENTIL HFA;VENTOLIN HFA) 108 (90 BASE) MCG/ACT INHALER    Inhale 2 puffs into the lungs every 6 (six) hours as needed for wheezing or shortness of breath.   AMIODARONE (PACERONE) 200 MG TABLET    Take 1 tablet (200 mg total) by mouth daily.   BENZONATATE (TESSALON) 100 MG CAPSULE    Take 1 capsule (100 mg total) by mouth 3 (three) times daily as needed for cough.   BRIMONIDINE-TIMOLOL (COMBIGAN) 0.2-0.5 % OPHTHALMIC SOLUTION    Place 1 drop into both eyes every 12 (twelve) hours.  CLONAZEPAM (KLONOPIN) 0.25 MG DISINTEGRATING TABLET    Take 1 tablet (0.25 mg total) by mouth every 4 (four) hours as needed (breakthrough anxiety).   CLONAZEPAM (KLONOPIN) 0.5 MG TABLET    Take 1 tablet (0.5 mg total) by mouth at bedtime.   DILTIAZEM (CARDIZEM CD) 180 MG 24 HR CAPSULE    Take 180 mg by mouth 3 (three) times daily.   DOXYCYCLINE (VIBRA-TABS) 100 MG TABLET    Take 1 tablet (100 mg total) by mouth 2 (two) times daily.   FEEDING SUPPLEMENT, RESOURCE BREEZE, (RESOURCE BREEZE) LIQD    Take 1 Container by mouth 3 (three) times daily between meals.   FERROUS SULFATE 325 (65 FE) MG TABLET    Take 325 mg by mouth 2 (two) times daily with a meal.   FUROSEMIDE (LASIX) 40 MG TABLET    One daily to prevent fluid retention   GUAIFENESIN 200 MG TABLET    Take 2 tablets (400 mg total) by mouth 2 (two) times daily.   IPRATROPIUM-ALBUTEROL (DUONEB) 0.5-2.5 (3) MG/3ML SOLN    Take 3 mLs by nebulization every 6 (six) hours as needed (cough/wheeze.). AND every 6 hours for 5 days starting 04/09/14.  (Last dose 04/14/14 at 0000.)   LAMOTRIGINE (LAMICTAL) 200 MG TABLET    Take 200 mg by mouth daily.   LEVALBUTEROL (XOPENEX) 0.63 MG/3ML NEBULIZER SOLUTION    Take 3 mLs (0.63 mg total) by nebulization every 6 (six) hours as  needed for wheezing or shortness of breath.   LEVOTHYROXINE (SYNTHROID, LEVOTHROID) 88 MCG TABLET    Take 88 mcg by mouth daily before breakfast.   METOPROLOL SUCCINATE (TOPROL-XL) 50 MG 24 HR TABLET    Take 1 tablet (50 mg total) by mouth daily. Take with or immediately following a meal.   MULTIVITAMIN-LUTEIN (OCUVITE-LUTEIN) CAPS    Take 1 capsule by mouth daily.   NITROGLYCERIN (NITROSTAT) 0.4 MG SL TABLET    Place 0.4 mg under the tongue every 5 (five) minutes as needed for chest pain.   OMEPRAZOLE (PRILOSEC) 20 MG CAPSULE    Take 20 mg by mouth 2 (two) times daily before a meal.   PILOCARPINE (SALAGEN) 5 MG TABLET    Take 5 mg by mouth 3 (three) times daily.   SACCHAROMYCES BOULARDII (FLORASTOR) 250 MG CAPSULE    Take 250 mg by mouth 2 (two) times daily. For 10 days.   SENNA (SENOKOT) 8.6 MG TABS    Take 2 tablets by mouth at bedtime.   SERTRALINE (ZOLOFT) 25 MG TABLET    Take 1 tablet (25 mg total) by mouth daily.     Physical Exam: Physical Exam  Constitutional: She appears distressed.  Elderly. Frail.   HENT:  Mouth/Throat: Mucous membranes are dry. No oral lesions. No uvula swelling. No oropharyngeal exudate, posterior oropharyngeal edema, posterior oropharyngeal erythema or tonsillar abscesses.  hearing loss bilaterally.  Eyes: EOM are normal. Pupils are equal, round, and reactive to light.  Wears corrective lenses.  Neck: Neck supple. No JVD present. No tracheal deviation present. No thyromegaly present.  Cardiovascular: Normal rate and intact distal pulses.  Exam reveals no gallop and no friction rub.   Murmur heard.  Systolic murmur is present with a grade of 3/6  Pulmonary/Chest: Tachypnea noted. She is in respiratory distress. She has no wheezes. She has rales. She exhibits no tenderness.  Abdominal: Soft. Bowel sounds are normal. She exhibits no distension and no mass. There is no tenderness.  Musculoskeletal: She exhibits no edema  and no tenderness.  Limited range of  motion of the right shoulder  Lymphadenopathy:    She has no cervical adenopathy.  Neurological: She is disoriented.  Conversational, but there is a significant memory deficit.  Skin:  Multiple senile ecchymoses, otherwise normal  Psychiatric: Her mood appears anxious. She is agitated and hyperactive. Cognition and memory are impaired. She exhibits abnormal recent memory.    Filed Vitals:   05/10/14 1052  BP: 148/98  Pulse: 94  Temp: 98.4 F (36.9 C)  TempSrc: Tympanic  Resp: 38  SpO2: 77%   Labs reviewed: Basic Metabolic Panel:  Recent Labs  11/19/13 0515 11/20/13 0534  01/19/14 02/02/14  04/13/14 1841  04/19/14 0350 04/21/14 0830  04/24/14 1951 04/25/14 0443 04/26/14 0405 04/29/14  NA 125* 125*  < > 136* 134*  < >  --   < > 136* 128*  < > 119* 121* 126* 125*  K 4.0 3.6*  < > 4.6 5.2  < >  --   < > 4.7 5.3  < > 4.0 3.9 3.8 5.0  CL 86* 88*  --   --   --   < >  --   < > 99 93*  < > 87* 87* 93*  --   CO2 22 23  --   --   --   < >  --   < > 21 20  < > 15* 16* 20  --   GLUCOSE 130* 109*  --   --   --   < >  --   < > 102* 131*  < > 216* 137* 114*  --   BUN 19 18  < > 15 14  < >  --   < > 18 12  < > 14 13 11 9   CREATININE 0.97 0.96  < > 1.0 1.0  < >  --   < > 1.17* 0.98  < > 0.84 0.89 0.88 0.9  CALCIUM 8.5 8.3*  --   --   --   < >  --   < > 9.2 9.1  < > 8.4 8.5 8.8  --   MG  --   --   --   --   --   --   --   --   --  1.7  --   --   --   --   --   PHOS  --  2.8  --   --   --   --   --   --   --   --   --   --   --   --   --   TSH  --   --   --   --  0.72  --  1.690  --   --   --   --   --   --   --   --   < > = values in this interval not displayed. Liver Function Tests:  Recent Labs  11/15/13 0500 11/16/13 0502 11/20/13 0534  02/02/14 04/13/14 1519 04/29/14  AST 15 18  --   < > 15 21 35  ALT 13 13  --   < > 9 15 31   ALKPHOS 83 74  --   < > 69 92 95  BILITOT <0.2* <0.2*  --   --   --  <0.2*  --   PROT 6.7 6.0  --   --   --  6.4  --   ALBUMIN 2.7* 2.2* 2.3*  --   --   3.1*  --   < > = values in this interval not displayed. CBC:  Recent Labs  06/15/13 1531 11/15/13 0500  04/13/14 1519  04/23/14 1325 04/24/14 0413 04/25/14 0443 04/29/14  WBC 9.9 17.3*  < > 7.7  < > 16.5* 13.9* 9.1 9.2  NEUTROABS 5.6 13.0*  --  4.8  --   --   --   --   --   HGB 11.8* 8.0*  < > 9.2*  < > 10.7* 10.8* 10.3* 10.1*  HCT 35.7* 24.1*  < > 26.5*  < > 31.8* 32.4* 30.4* 31*  MCV 83.1 70.5*  < > 81.8  < > 82.6 83.1 82.8  --   PLT 469.0* 552*  < > 589*  < > 467* 536* 487* 589*  < > = values in this interval not displayed.  Past Procedures:  11/15/2013 CLINICAL DATA: Cough. Evaluate for pneumonia versus edema. EXAM: CHEST - IMPRESSION: Persistent left lower lobe airspace disease suspicious for pneumonia. No significant pleural effusion.   11/15/2013 CLINICAL DATA: Chest pain EXAM: PORTABLE CHEST - IMPRESSION: Left lower lobe infiltrate, worrisome for pneumonia.   04/03/14 CXR improving bilateral pneumonitis.    04/13/14 CXR IMPRESSION: 1. Partial improvement in the left lower lung airspace disease. 2. Stable cardiomegaly    04/14/14 CXR IMPRESSION: Unchanged bibasilar lung opacities, morphology favoring atelectasis  04/14/14 CT chest angio chest PT IMPRESSION: 1. Negative for pulmonary embolism. 2. Small left pleural effusion. 3. Three left lung nodules, measuring up to 13 mm. Bronchogenic carcinoma is a diagnostic possibility, recommend follow-up chest CT in 3 months. 4. Emphysema   04/17/14 CT abd and pelvis: IMPRESSION: 4.6 cm eccentric cecal mass, worrisome for primary colonic neoplasm. Colonoscopy is suggested for further evaluation. 7 x 11 mm left lower lobe nodule, metastasis not excluded. 3.5 cm left adrenal mass, unchanged since 2012, likely reflecting a benign adrenal adenoma. Small left pleural effusion, possibly partially loculated.   04/24/14 DG abd: IMPRESSION: Nonobstructive bowel gas pattern. Stool volume is within normal limits.  04/22/14 CXR IMPRESSION: Bibasilar  atelectasis unchanged. Increase in small left effusion     Assessment/Plan Acute respiratory failure with hypoxia O2 sat in high 70s%. Hx of CHF, A-fib, and  Hyponatremia. Newly identified metastatic disease lungs.  Comfort measures: O2 via [place]. Lorazepam and Morphine prn. Dc all other meds.    05/03/14 PET  IMPRESSION:  1. 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  2. Mildly hypermetabolic superior mediastinal and right  supraclavicular lymph nodes.  3. Hypermetabolic left adrenal mass. Given that the size and  appearance of the mass has not changed from 30/16/0109, a  hypermetabolic adenoma is favored. A collision tumor cannot be  definitively excluded, however.  4. Small pericardial effusion.  5. Small left pleural effusion, partially loculated.  6. Cholelithiasis.    Hyponatremia Comfort feeding-no longer curative approach.   PAF (paroxysmal atrial fibrillation) Presently rate controlled.   Colonic cancer 05/03/14 PET   IMPRESSION:  1. 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  2. Mildly hypermetabolic superior mediastinal and right  supraclavicular lymph nodes.  3. Hypermetabolic left adrenal mass. Given that the size and  appearance of the mass has not changed from 32/35/5732, a  hypermetabolic adenoma is favored. A collision tumor cannot be  definitively excluded, however.  4. Small pericardial effusion.  5. Small left pleural effusion, partially loculated.  6.  Cholelithiasis.    Chronic diastolic congestive heart failure Chronic.   Cancer, metastatic to lung 05/03/14 PE  IMPRESSION:  1. 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  2. Mildly hypermetabolic superior mediastinal and right  supraclavicular lymph nodes.  3. Hypermetabolic left adrenal mass. Given that the size and  appearance of the mass has not changed from 53/20/2334, a  hypermetabolic adenoma is favored.  A collision tumor cannot be  definitively excluded, however.  4. Small pericardial effusion.  5. Small left pleural effusion, partially loculated.  6. Cholelithiasis.      Family/ Staff Communication: observe the patient and comfort measures.   Goals of Care: SNF. Hospice Service   Labs/tests ordered: none

## 2014-05-10 NOTE — Assessment & Plan Note (Signed)
Chronic. 

## 2014-05-10 NOTE — Assessment & Plan Note (Addendum)
O2 sat in high 70s%. Hx of CHF, A-fib, and  Hyponatremia. Newly identified metastatic disease lungs.  Comfort measures: O2 via [place]. Lorazepam and Morphine prn. Dc all other meds.    05/03/14 PET  IMPRESSION:  1. 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  2. Mildly hypermetabolic superior mediastinal and right  supraclavicular lymph nodes.  3. Hypermetabolic left adrenal mass. Given that the size and  appearance of the mass has not changed from 27/01/5008, a  hypermetabolic adenoma is favored. A collision tumor cannot be  definitively excluded, however.  4. Small pericardial effusion.  5. Small left pleural effusion, partially loculated.  6. Cholelithiasis.

## 2014-05-10 NOTE — Assessment & Plan Note (Signed)
05/03/14 PET   IMPRESSION:  1. 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  2. Mildly hypermetabolic superior mediastinal and right  supraclavicular lymph nodes.  3. Hypermetabolic left adrenal mass. Given that the size and  appearance of the mass has not changed from 09/08/2535, a  hypermetabolic adenoma is favored. A collision tumor cannot be  definitively excluded, however.  4. Small pericardial effusion.  5. Small left pleural effusion, partially loculated.  6. Cholelithiasis.

## 2014-05-10 NOTE — Assessment & Plan Note (Signed)
Comfort feeding-no longer curative approach.

## 2014-05-10 NOTE — Assessment & Plan Note (Signed)
05/03/14 PE  IMPRESSION:  1. 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  2. Mildly hypermetabolic superior mediastinal and right  supraclavicular lymph nodes.  3. Hypermetabolic left adrenal mass. Given that the size and  appearance of the mass has not changed from 33/38/3291, a  hypermetabolic adenoma is favored. A collision tumor cannot be  definitively excluded, however.  4. Small pericardial effusion.  5. Small left pleural effusion, partially loculated.  6. Cholelithiasis.

## 2014-05-10 NOTE — Assessment & Plan Note (Signed)
Presently rate controlled.

## 2014-05-11 ENCOUNTER — Ambulatory Visit: Payer: Medicare Other | Admitting: Nurse Practitioner

## 2014-05-11 ENCOUNTER — Ambulatory Visit: Payer: Medicare Other

## 2014-05-31 NOTE — Progress Notes (Addendum)
Patient ID: Jenny [name], female   DOB: 09-Oct-1927, 80 y.o.   MRN: [id number] DOS 04/07/14 took place [facility] and CPT [place] be [address].

## 2014-05-31 NOTE — Addendum Note (Signed)
Addended by: Iliza Blankenbeckler X on: 05/31/2014 01:12 PM   Modules accepted: Level of Service

## 2014-06-12 DEATH — deceased

## 2014-07-23 IMAGING — CR DG HIP (WITH OR WITHOUT PELVIS) 2-3V*L*
3 series · 3 of 3 positions shown · non-contrast
Comparison: 02/20/2012

CLINICAL DATA: Hip pain

LEFT HIP - COMPLETE 2+ VIEW

[x pelvis]
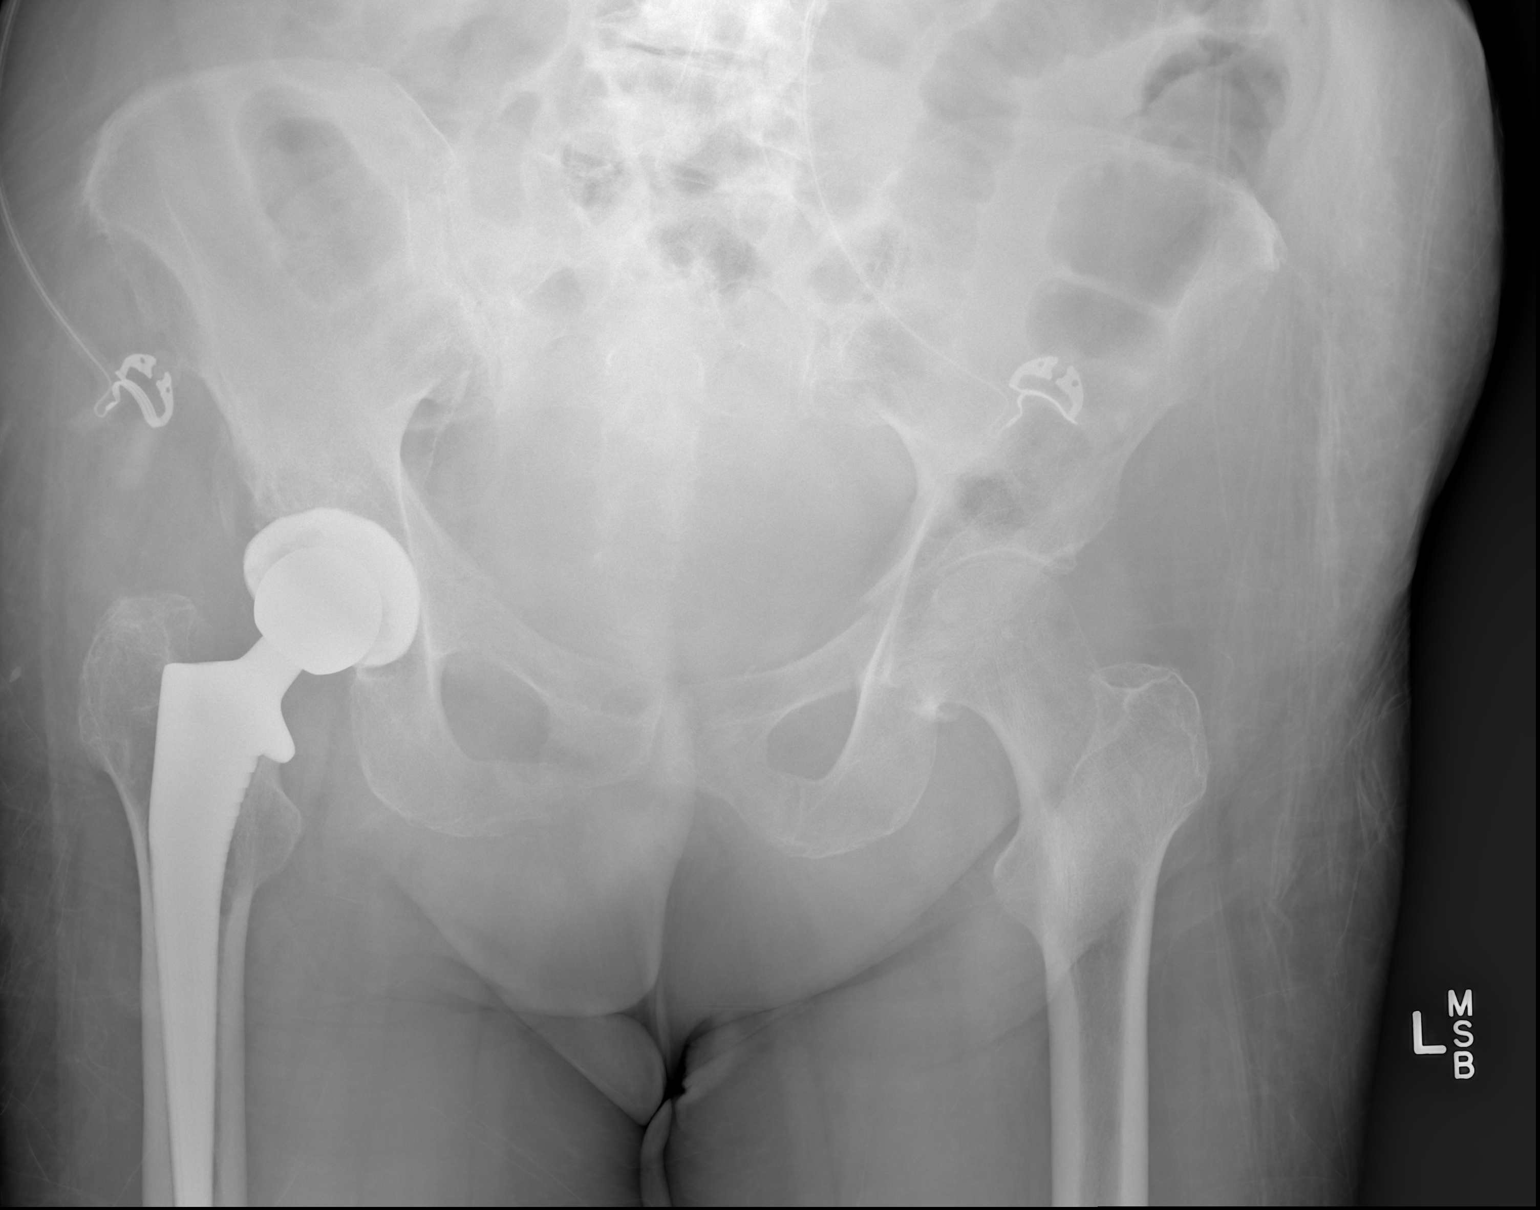

[x hip ap left]
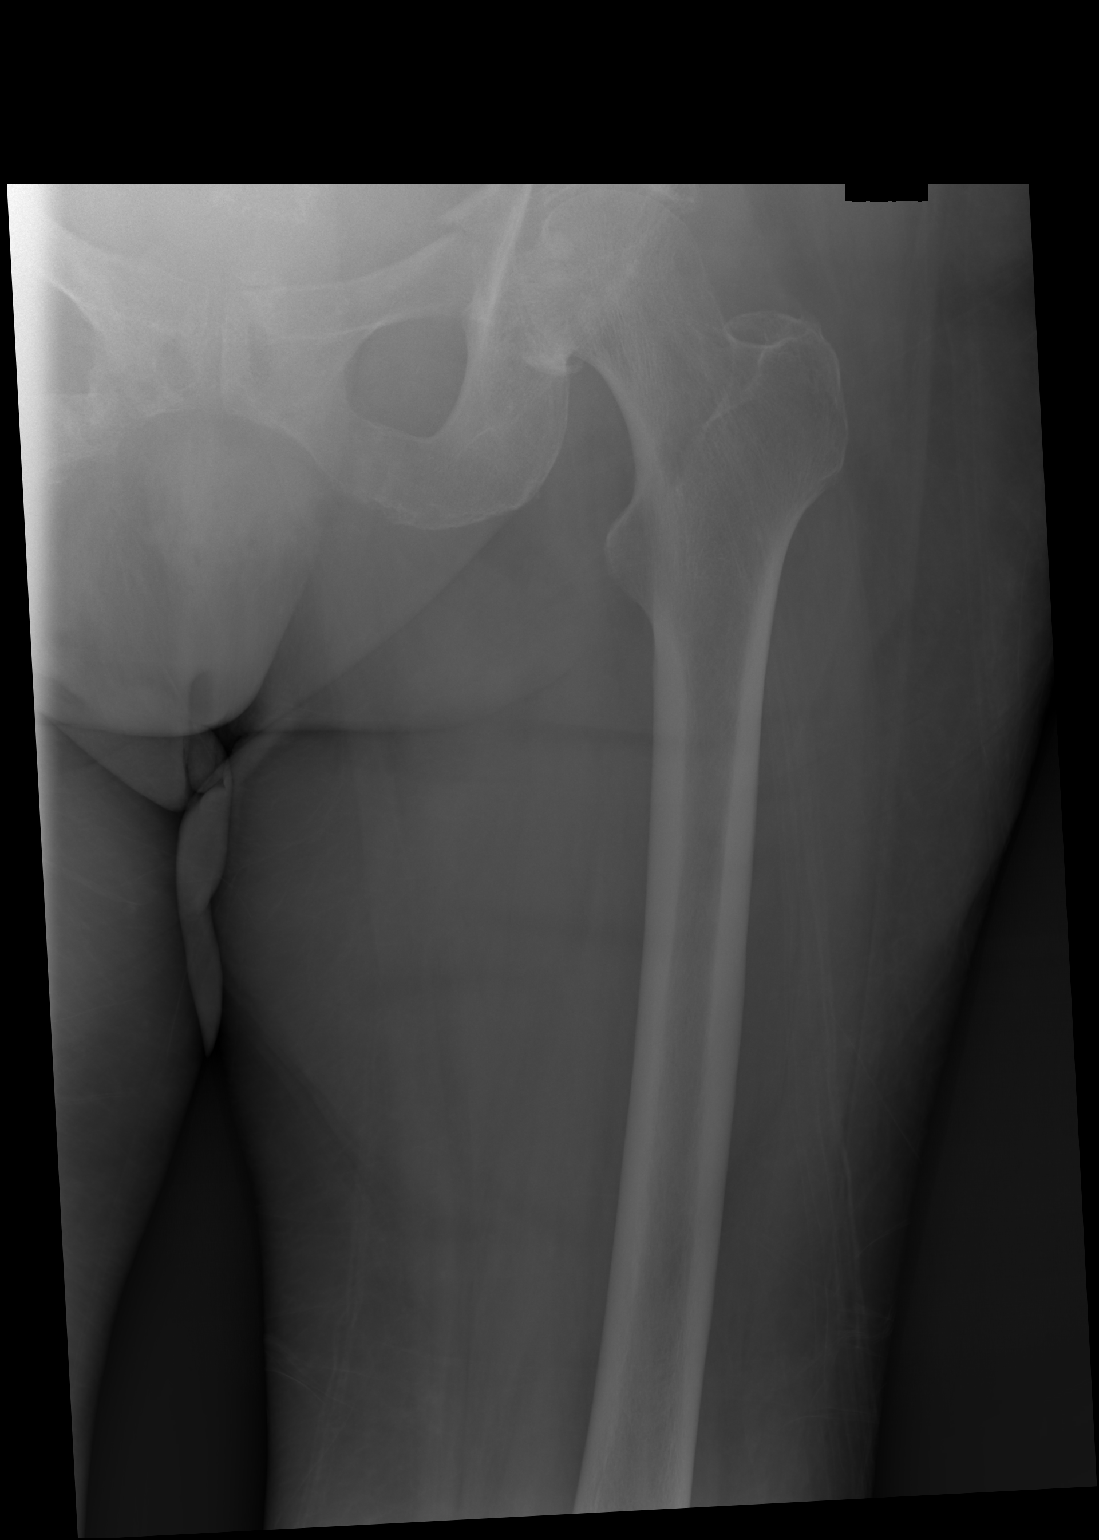

[w hip lat left]
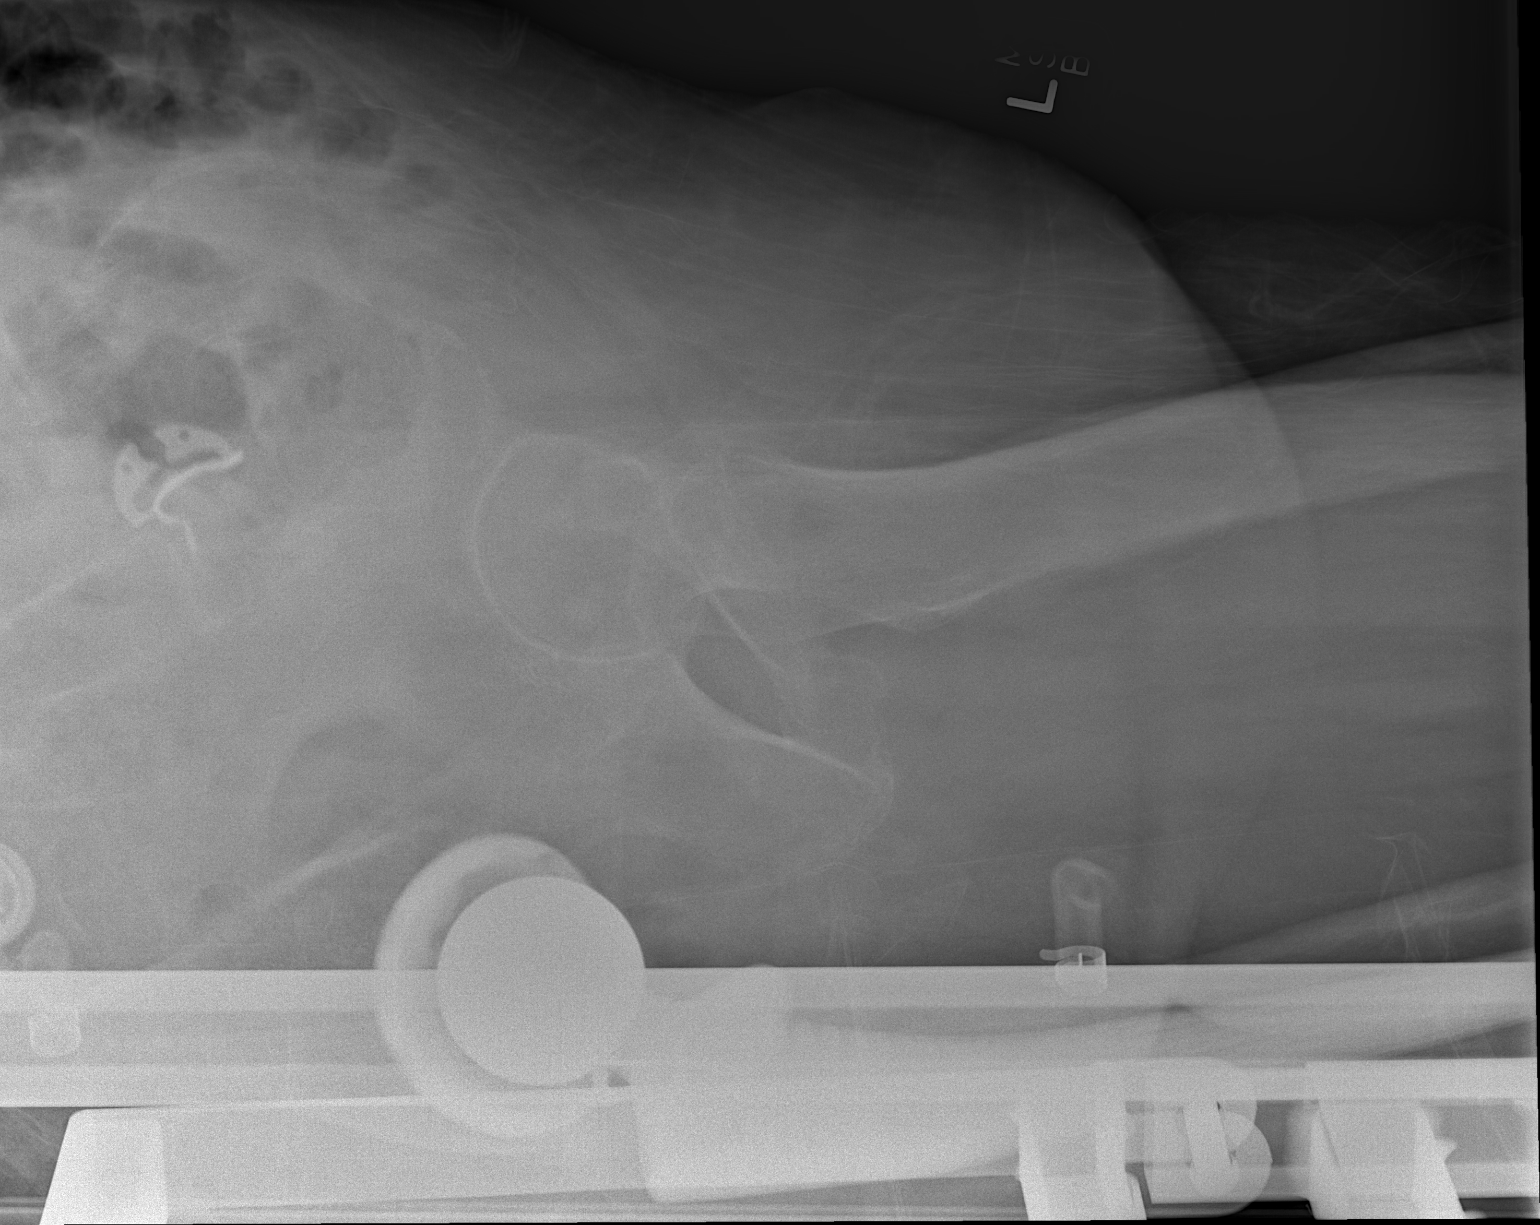

[3 of 3 positions shown; findings below may reference images not displayed]

FINDINGS: Three views of the left hip submitted.  Mild spurring of
the left humeral head.  There is mild displaced fracture of the
left superior pubic ramus at the level of the superior acetabulum.
Probable nondisplaced fracture or bilateral inferior pubic ramus.
Mild displaced fracture of the right superior pubic ramus.  Right
hip prosthesis in anatomic alignment.
IMPRESSION: No hip dislocation.  Spurring of the left femoral head.  Mild
displaced fracture of the left superior pubic ramus at the level of
the left acetabulum.  Probable nondisplaced fracture bilateral
inferior pubic ramus.  Mild displaced fracture of the right
superior pubic ramus.  Correlation with CT scan is recommended.

## 2014-10-04 ENCOUNTER — Ambulatory Visit (INDEPENDENT_AMBULATORY_CARE_PROVIDER_SITE_OTHER): Payer: Medicare Other | Admitting: Ophthalmology

## 2014-10-21 ENCOUNTER — Encounter (HOSPITAL_COMMUNITY): Payer: Self-pay | Admitting: Cardiovascular Disease

## 2015-03-28 IMAGING — CR DG CHEST 2V
2 series · 2 of 2 positions shown · non-contrast
Comparison: October 12, 2012

CLINICAL DATA: Shortness of breath, COPD

CHEST - 2 VIEW

[view not recorded (1 of 2)]
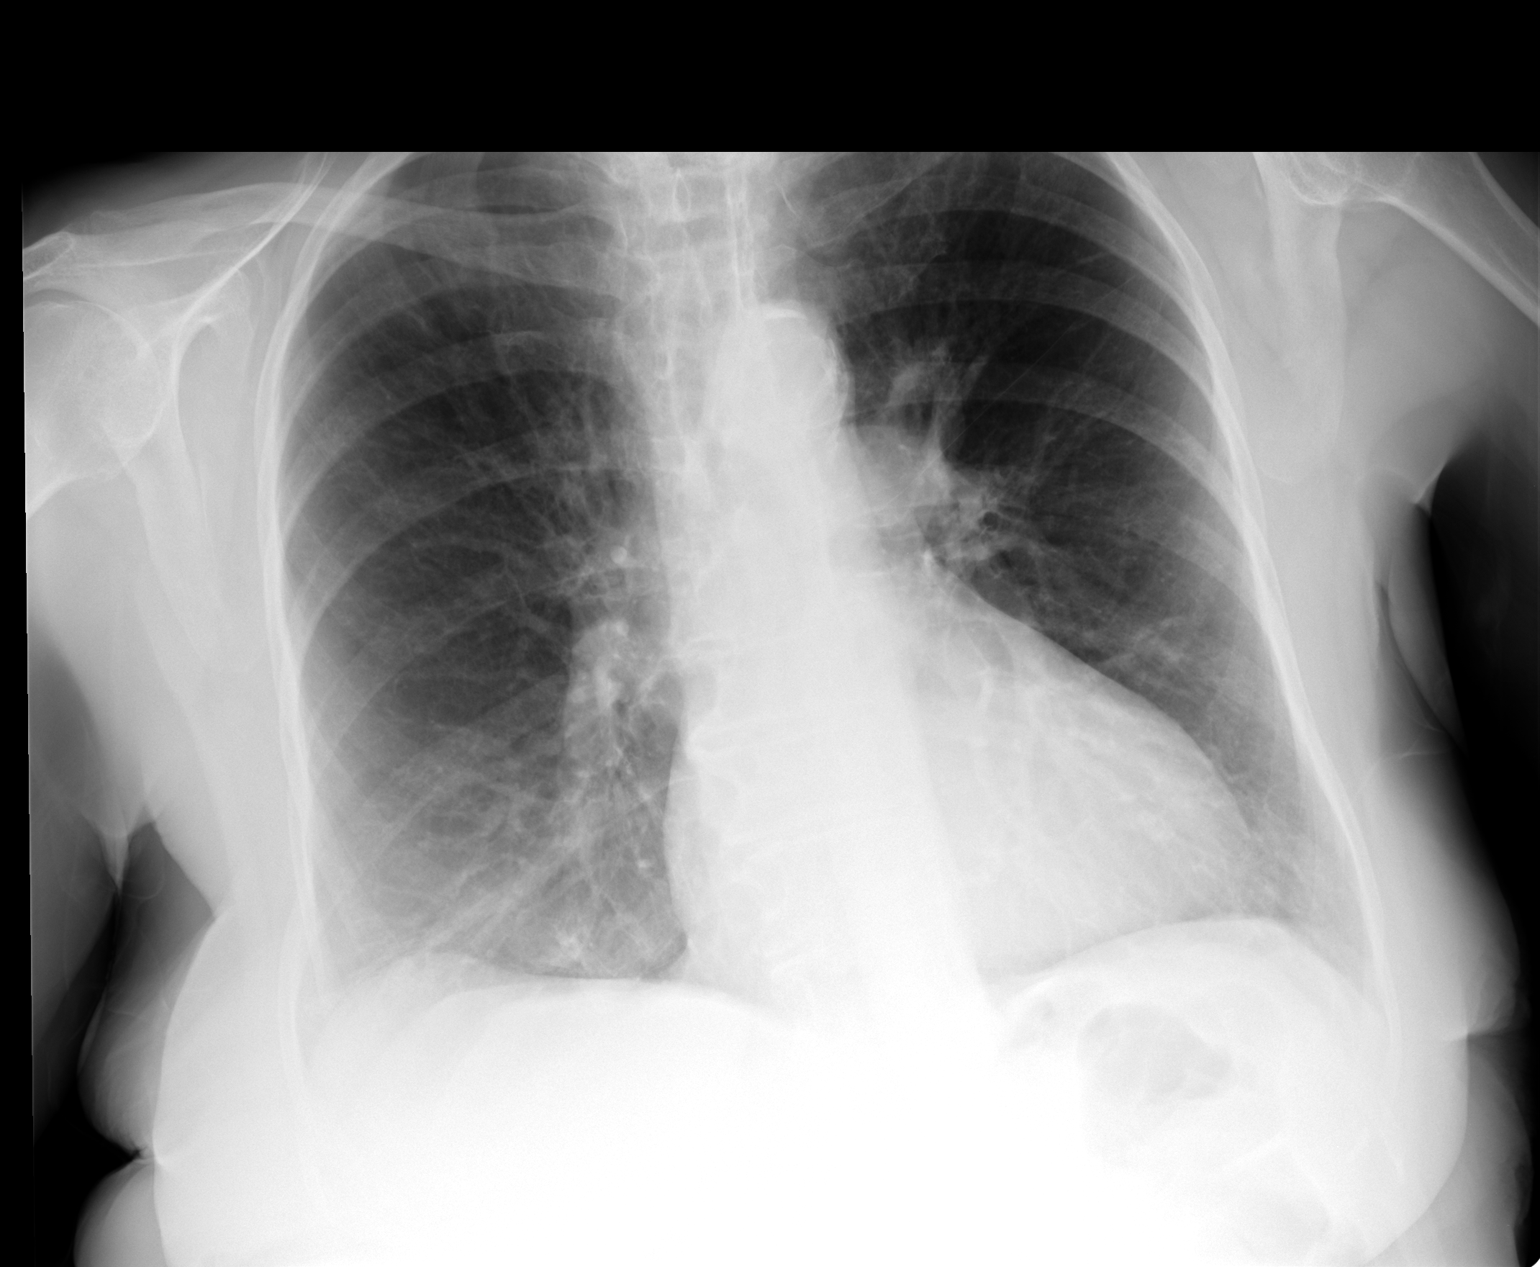

[view not recorded (2 of 2)]
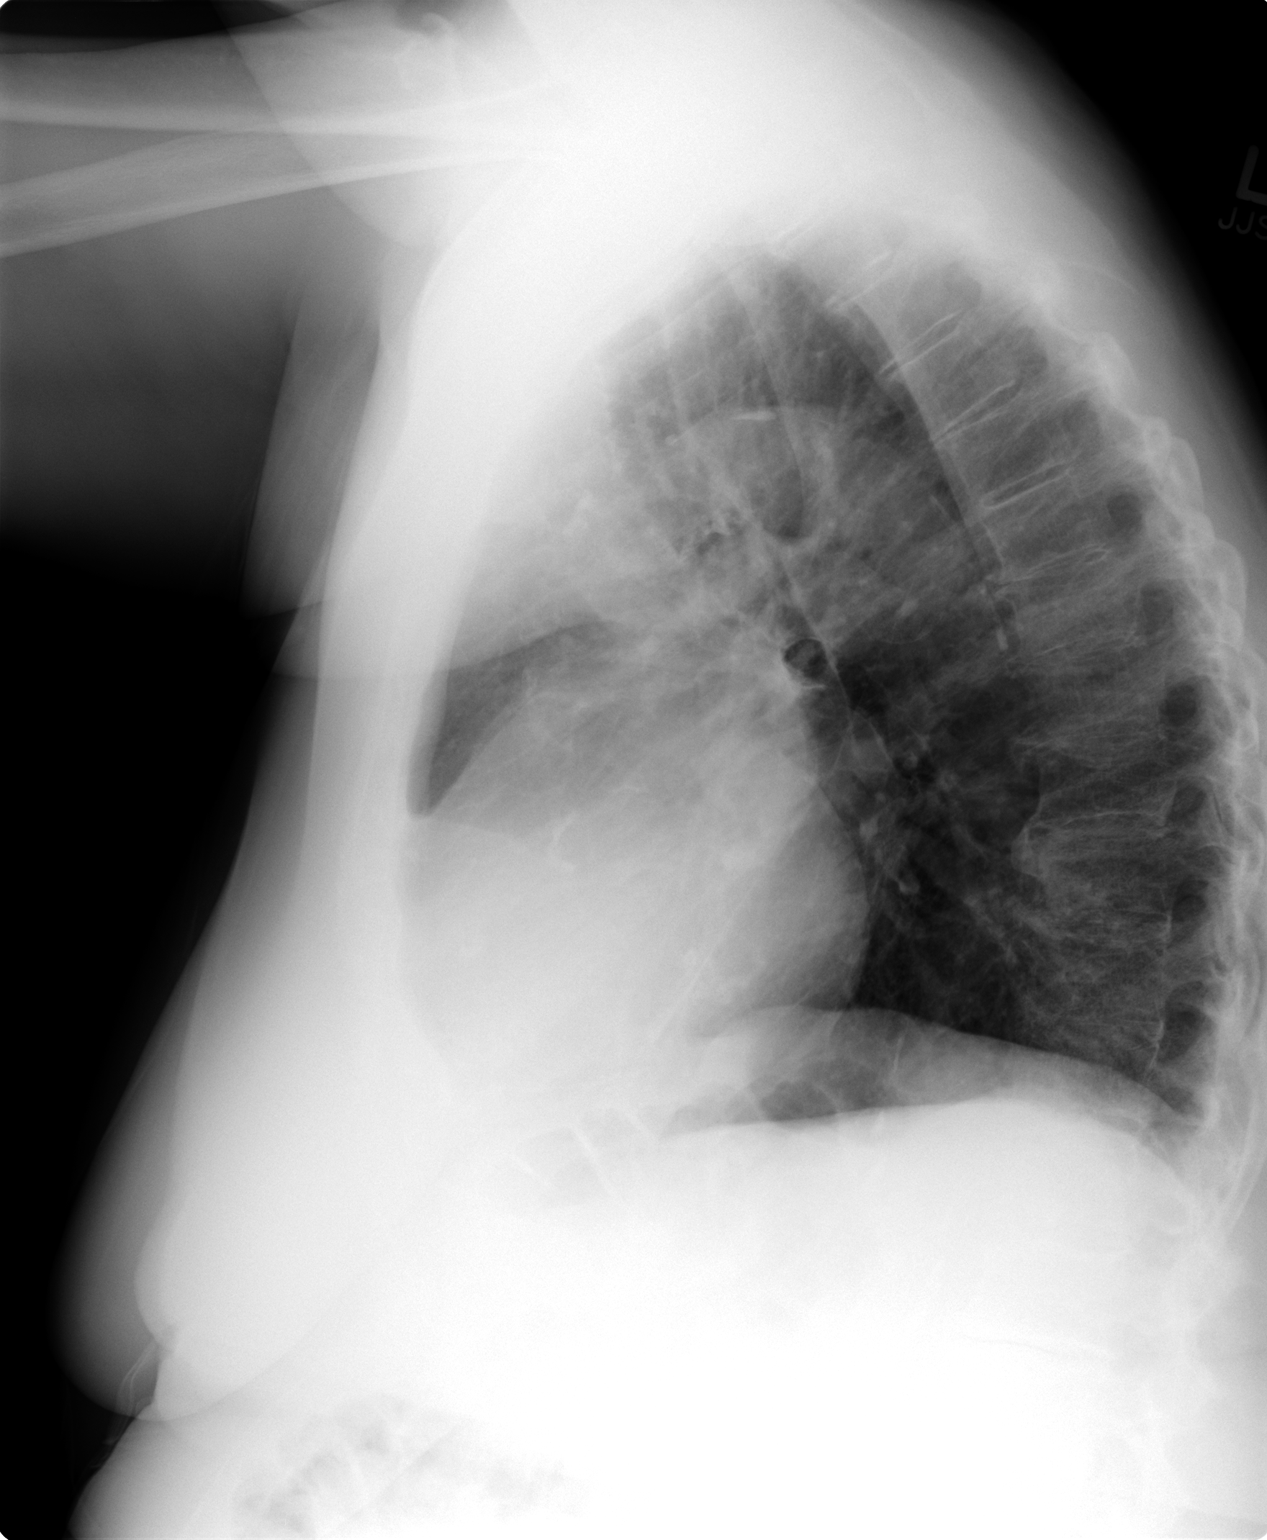

[2 of 2 positions shown; findings below may reference images not displayed]

FINDINGS: There is no focal infiltrate, pulmonary edema, or pleural
effusion.  There is minor eventration of the right hemidiaphragm.
The mediastinal contour and cardiac silhouette are normal.  There
are degenerative joint changes of the spine.
IMPRESSION: No acute cardiopulmonary disease identified.

## 2016-01-24 IMAGING — CR DG CHEST 2V
2 series · 2 of 2 positions shown · non-contrast
Comparison: 11/15/2013

CLINICAL DATA: DEHYDRATION

EXAM:
CHEST - 2 VIEW

[w chest lat]
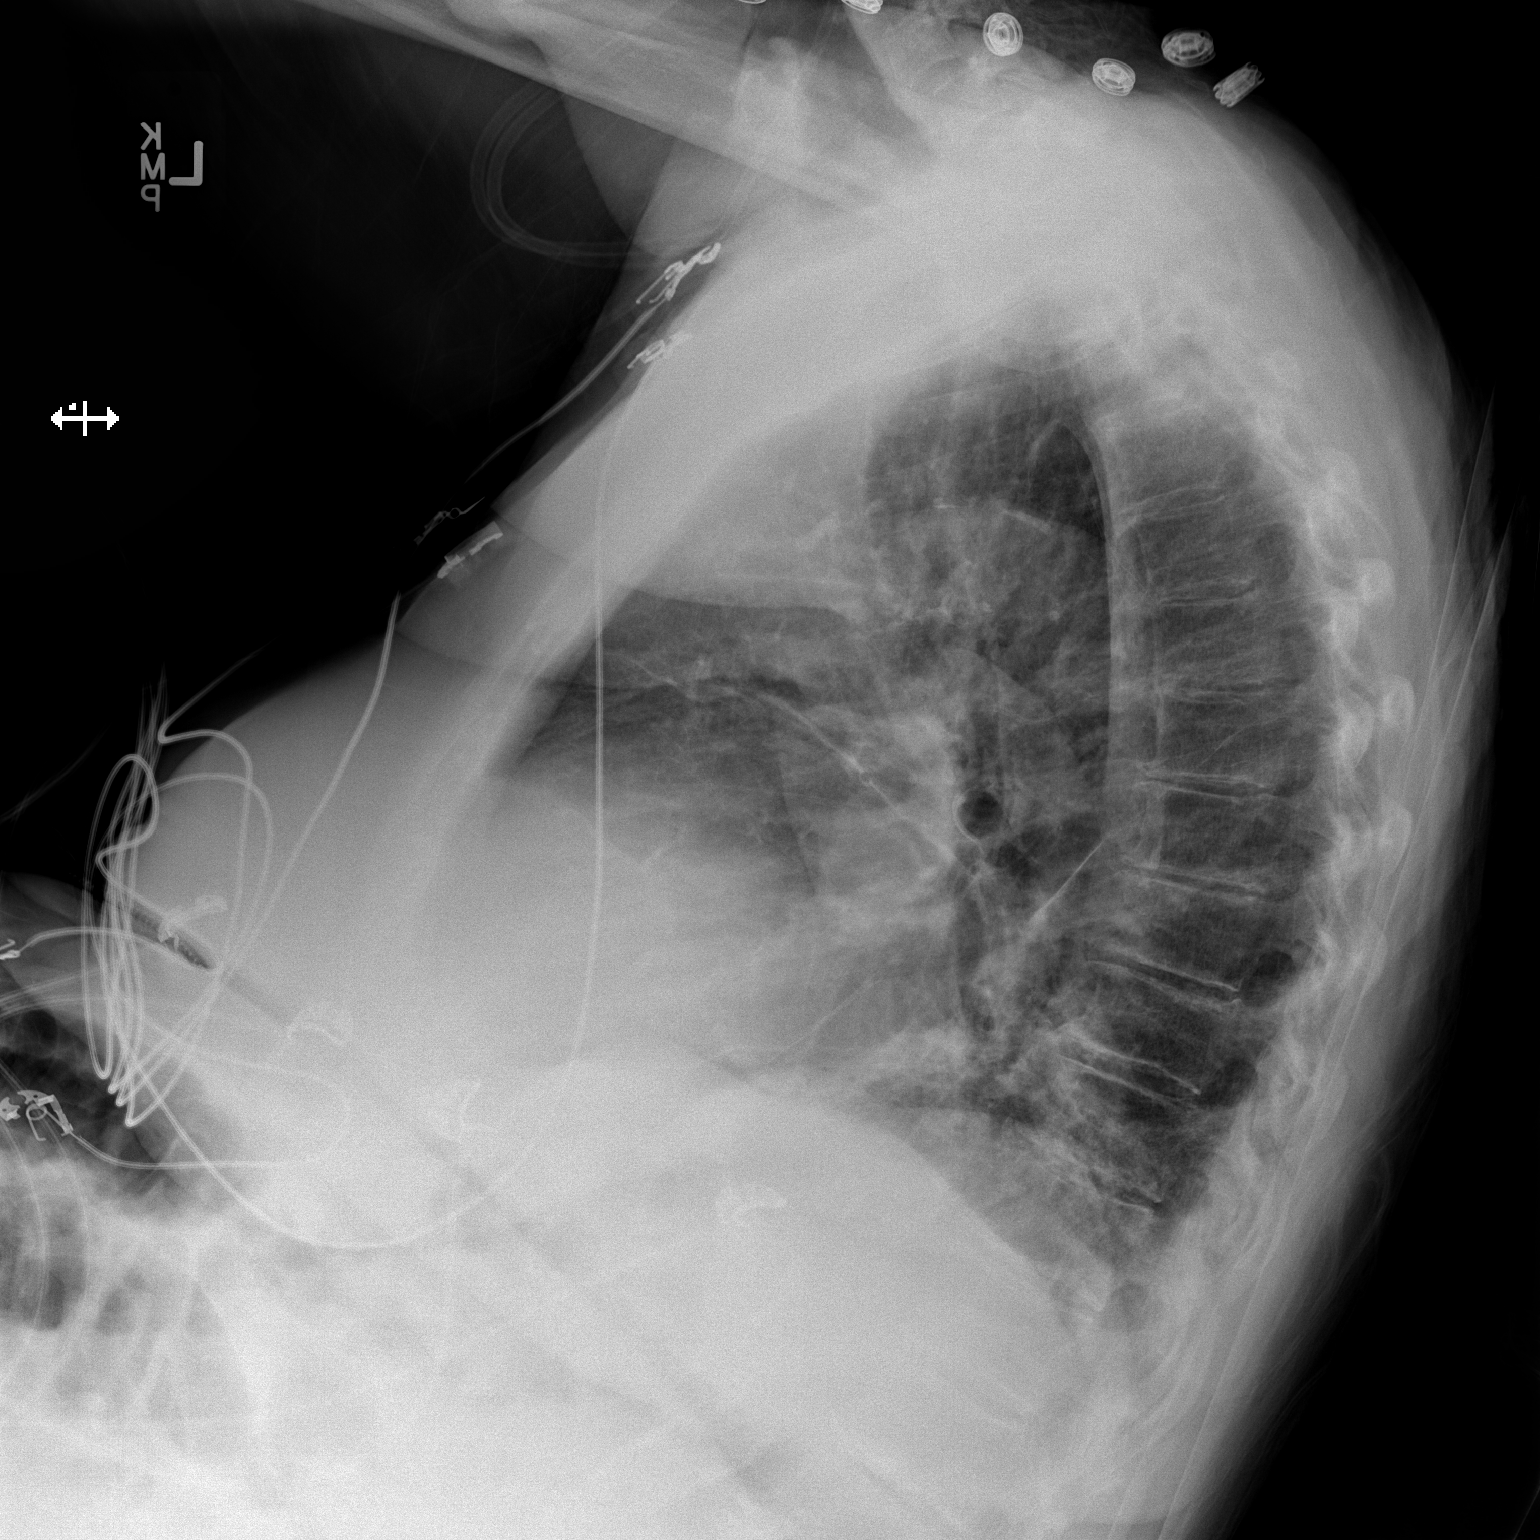

[x chest ap]
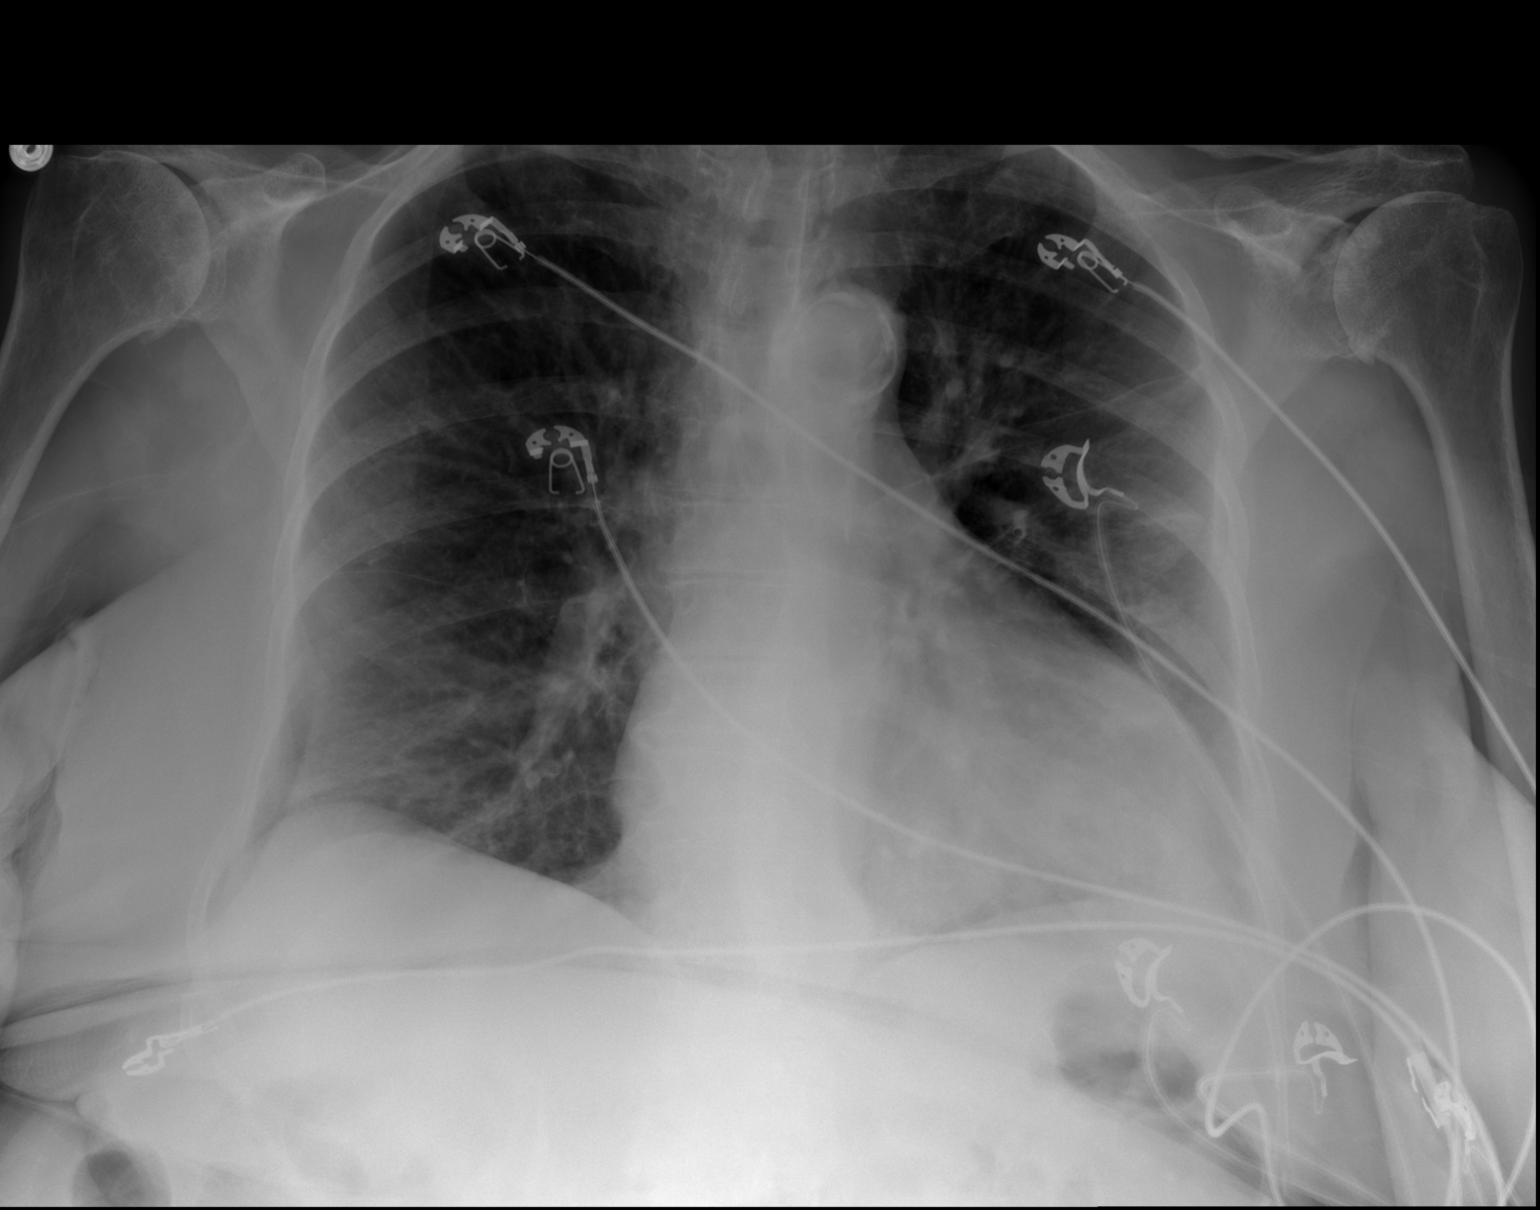

[2 of 2 positions shown; findings below may reference images not displayed]

FINDINGS: Stable cardiomegaly. Improving left lower lung airspace disease,
with mild scattered residual airspace opacities. There is some
residual linear scarring or subsegmental atelectasis in the left mid
lung. Right lung remains clear. Atheromatous aorta. .
No effusion.
Degenerative changes in bilateral shoulders.
IMPRESSION: 1. Partial improvement in the left lower lung airspace disease.
2. Stable cardiomegaly
